# Patient Record
Sex: Female | Born: 1970 | Race: Black or African American | Hispanic: No | Marital: Single | Smoking: Former smoker
Health system: Southern US, Community
[De-identification: ages and names within clinical notes are randomized; demographics above are authoritative.]

## PROBLEM LIST (undated history)

## (undated) DIAGNOSIS — M1990 Unspecified osteoarthritis, unspecified site: Secondary | ICD-10-CM

## (undated) DIAGNOSIS — R7303 Prediabetes: Secondary | ICD-10-CM

## (undated) DIAGNOSIS — F329 Major depressive disorder, single episode, unspecified: Secondary | ICD-10-CM

## (undated) DIAGNOSIS — I10 Essential (primary) hypertension: Secondary | ICD-10-CM

## (undated) DIAGNOSIS — R0602 Shortness of breath: Secondary | ICD-10-CM

## (undated) DIAGNOSIS — R200 Anesthesia of skin: Secondary | ICD-10-CM

## (undated) DIAGNOSIS — M549 Dorsalgia, unspecified: Secondary | ICD-10-CM

## (undated) DIAGNOSIS — E559 Vitamin D deficiency, unspecified: Secondary | ICD-10-CM

## (undated) DIAGNOSIS — M2550 Pain in unspecified joint: Secondary | ICD-10-CM

## (undated) DIAGNOSIS — R600 Localized edema: Secondary | ICD-10-CM

## (undated) DIAGNOSIS — R079 Chest pain, unspecified: Secondary | ICD-10-CM

## (undated) DIAGNOSIS — F32A Depression, unspecified: Secondary | ICD-10-CM

## (undated) HISTORY — DX: Anesthesia of skin: R20.0

## (undated) HISTORY — PX: COLONOSCOPY: SHX174

## (undated) HISTORY — PX: TUBAL LIGATION: SHX77

## (undated) HISTORY — DX: Localized edema: R60.0

## (undated) HISTORY — DX: Unspecified osteoarthritis, unspecified site: M19.90

## (undated) HISTORY — DX: Prediabetes: R73.03

## (undated) HISTORY — DX: Essential (primary) hypertension: I10

## (undated) HISTORY — PX: CHOLECYSTECTOMY: SHX55

## (undated) HISTORY — DX: Depression, unspecified: F32.A

## (undated) HISTORY — DX: Pain in unspecified joint: M25.50

## (undated) HISTORY — DX: Shortness of breath: R06.02

## (undated) HISTORY — DX: Chest pain, unspecified: R07.9

## (undated) HISTORY — DX: Vitamin D deficiency, unspecified: E55.9

## (undated) HISTORY — DX: Dorsalgia, unspecified: M54.9

---
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
## 2010-05-14 ENCOUNTER — Emergency Department (HOSPITAL_BASED_OUTPATIENT_CLINIC_OR_DEPARTMENT_OTHER)
Admission: EM | Admit: 2010-05-14 | Discharge: 2010-05-14 | Disposition: A | Discharge status: Home / Self Care | Payer: Self-pay | Attending: Emergency Medicine | Admitting: Emergency Medicine

## 2010-05-14 DIAGNOSIS — K029 Dental caries, unspecified: Secondary | ICD-10-CM | POA: Insufficient documentation

## 2010-05-14 DIAGNOSIS — I10 Essential (primary) hypertension: Secondary | ICD-10-CM | POA: Insufficient documentation

## 2010-05-14 DIAGNOSIS — K089 Disorder of teeth and supporting structures, unspecified: Secondary | ICD-10-CM | POA: Insufficient documentation

## 2010-05-14 DIAGNOSIS — E669 Obesity, unspecified: Secondary | ICD-10-CM | POA: Insufficient documentation

## 2010-10-16 ENCOUNTER — Emergency Department (HOSPITAL_BASED_OUTPATIENT_CLINIC_OR_DEPARTMENT_OTHER)
Admission: EM | Admit: 2010-10-16 | Discharge: 2010-10-16 | Disposition: A | Discharge status: Home / Self Care | Payer: Self-pay | Attending: Emergency Medicine | Admitting: Emergency Medicine

## 2010-10-16 ENCOUNTER — Encounter: Payer: Self-pay | Admitting: *Deleted

## 2010-10-16 DIAGNOSIS — M545 Low back pain, unspecified: Secondary | ICD-10-CM | POA: Insufficient documentation

## 2010-10-16 DIAGNOSIS — S39012A Strain of muscle, fascia and tendon of lower back, initial encounter: Secondary | ICD-10-CM

## 2010-10-16 DIAGNOSIS — M25559 Pain in unspecified hip: Secondary | ICD-10-CM | POA: Insufficient documentation

## 2010-10-16 DIAGNOSIS — R209 Unspecified disturbances of skin sensation: Secondary | ICD-10-CM | POA: Insufficient documentation

## 2010-10-16 LAB — PREGNANCY, URINE: Preg Test, Ur: NEGATIVE

## 2010-10-16 MED ORDER — HYDROCODONE-ACETAMINOPHEN 5-325 MG PO TABS: 2.0000 | ORAL_TABLET | ORAL | Status: AC | PRN

## 2010-10-16 MED ORDER — IBUPROFEN 600 MG PO TABS: 600.0000 mg | ORAL_TABLET | Freq: Four times a day (QID) | ORAL | Status: AC | PRN

## 2010-10-16 MED ORDER — IBUPROFEN 800 MG PO TABS
800.0000 mg | ORAL_TABLET | Freq: Once | ORAL | Status: AC
  Administered 2010-10-16: 800 mg via ORAL
  Filled 2010-10-16: qty 1

## 2010-10-16 MED ORDER — DIAZEPAM 5 MG/ML IJ SOLN
2.5000 mg | Freq: Once | INTRAMUSCULAR | Status: AC
  Administered 2010-10-16: 2.5 mg via INTRAMUSCULAR
  Filled 2010-10-16: qty 2

## 2010-10-16 MED ORDER — CYCLOBENZAPRINE HCL 10 MG PO TABS: 10.0000 mg | ORAL_TABLET | Freq: Two times a day (BID) | ORAL | Status: AC | PRN

## 2010-10-16 MED ORDER — MORPHINE SULFATE 4 MG/ML IJ SOLN
4.0000 mg | Freq: Once | INTRAMUSCULAR | Status: AC
  Administered 2010-10-16: 4 mg via INTRAMUSCULAR
  Filled 2010-10-16: qty 1

## 2010-10-16 NOTE — ED Notes (Signed)
Lower back pain. No known inj.

## 2010-10-16 NOTE — ED Provider Notes (Signed)
History    Scribed for Forbes Cellar, MD, the patient was seen in room MH02/MH02. This chart was scribed by Clarita Crane. This patient's care was started at 3:03PM.  CSN: [id number] Arrival date & time: 10/16/2010  2:28 PM  Chief Complaint  Patient presents with  . Back Pain   The history is provided by the patient. No language interpreter was used.  Patient is a 40 year old female c/o constant lower back pain radiating to bilateral hips described as an aching and burning onset 3 days ago and persistent since. States back pain also radiates to her bilateral thighs but only with standing.  Denies incontinence, urinary retention,abdominal pain, nausea, vomiting, chest pain, chills. States pain is aggravated with walking and mildly relieved with sitting in a stiff chair. Notes pain was not relieved with use of Oxycodone and Tylenol. Denies h/o similar back pain previously. Patient reports her occupation requires significant prolonged standing which she believes contributes to her lower back pain. Patient additionally states she will have intermittent episodes of numbness to her bilateral lower extremities which began prior to the onset of her back pain. Denies h/o chronic back pain, HIV, drug abuse, etoh use, cancer. Reports surgical h/o tubal ligation and cholecystectomy. Patient is a current smoker.  denies incontinence/retention. Pain worse with movement  PAST MEDICAL HISTORY:  History reviewed. No pertinent past medical history.  PAST SURGICAL HISTORY:  Past Surgical History  Procedure Date  . Tubal ligation   . Cholecystectomy     MEDICATIONS:  Previous Medications   No medications on file     ALLERGIES:  Allergies as of 10/16/2010  . (No Known Allergies)     FAMILY HISTORY:  No family history on file.   SOCIAL HISTORY: History   Social History  . Marital Status: Single    Spouse Name: N/A    Number of Children: N/A  . Years of Education: N/A   Social History Main  Topics  . Smoking status: Current Everyday Smoker -- 0.5 packs/day  . Smokeless tobacco: None  . Alcohol Use: No  . Drug Use: No  . Sexually Active:    Other Topics Concern  . None   Social History Narrative  . None      OB History    Grav Para Term Preterm Abortions TAB SAB Ect Mult Living                  Review of Systems  10 Systems reviewed and are negative for acute change except as noted in the HPI.   Physical Exam  BP 162/76  Pulse 93  Temp(Src) 98.5 F (36.9 C) (Oral)  Resp 20  SpO2 100%  Physical Exam  Nursing note and vitals reviewed. Constitutional: She is oriented to person, place, and time. She appears well-developed and well-nourished.  HENT:  Head: Normocephalic and atraumatic.  Mouth/Throat: Oropharynx is clear and moist.  Eyes: Conjunctivae are normal. Pupils are equal, round, and reactive to light.  Neck: Neck supple.  Cardiovascular: Normal rate and regular rhythm.  Exam reveals no gallop and no friction rub.   No murmur heard. Pulmonary/Chest: Effort normal and breath sounds normal. She has no wheezes. She has no rales.  Abdominal: Soft. She exhibits no distension. There is no tenderness. There is no rebound and no guarding.  Musculoskeletal:       Diffuse midline and paraspinal lumbar tenderness to palpation. Ambulatory with a cautious gait. Positive left-sided straight leg raise. Negative right sided straight leg  raise.  Lymphadenopathy:    She has no cervical adenopathy.  Neurological: She is alert and oriented to person, place, and time. No sensory deficit.       5/5 strength of bilateral lower extremities.   Skin: Skin is warm and dry.       No saddle anesthesia  Psychiatric: She has a normal mood and affect. Her behavior is normal.   ED Course  Procedures  OTHER DATA REVIEWED: Nursing notes, vital signs, and past medical records reviewed.    DIAGNOSTIC STUDIES:    LABS / RADIOLOGY: Results for orders placed during the  hospital encounter of 10/16/10  PREGNANCY, URINE      Component Value Range   Preg Test, Ur NEGATIVE       PROCEDURES:  ED COURSE / COORDINATION OF CARE: 3:11PM- Patient explained plan of treatment and consents to treatment plan at this time. Patient advised of smoking cessation as well.  3:45PM- Feels better after morphine/valium. Continues to deny new numbness/tingling/weakness. Comfortable with plan for pain control and home  MDM: Suspect msk back pain. Possible Lt sciatica component as well. Do not suspect cauda equina or infectious. Feeling better now. Will discharge with ibuprofen/norco/flexeril. Needs pmd f/u    PLAN: Discharge The patient is to return the emergency department if there is any worsening of symptoms. I have reviewed the discharge instructions with the patient/family   CONDITION ON DISCHARGE: Stable   MEDICATIONS GIVEN IN THE E.D.  Medications  diazepam (VALIUM) injection 2.5 mg (2.5 mg Intramuscular Given 10/16/10 1515)  morphine injection 4 mg (4 mg Intramuscular Given 10/16/10 1515)  ibuprofen (ADVIL,MOTRIN) tablet 800 mg (800 mg Oral Given 10/16/10 1515)     I personally performed the services described in this documentation, which was scribed in my presence. The recorded information has been reviewed and considered. Forbes Cellar, MD    Forbes Cellar, MD 10/16/10 [phone]

## 2014-08-01 ENCOUNTER — Emergency Department (HOSPITAL_BASED_OUTPATIENT_CLINIC_OR_DEPARTMENT_OTHER): Payer: Self-pay

## 2014-08-01 ENCOUNTER — Encounter (HOSPITAL_BASED_OUTPATIENT_CLINIC_OR_DEPARTMENT_OTHER): Payer: Self-pay | Admitting: *Deleted

## 2014-08-01 ENCOUNTER — Emergency Department (HOSPITAL_BASED_OUTPATIENT_CLINIC_OR_DEPARTMENT_OTHER)
Admission: EM | Admit: 2014-08-01 | Discharge: 2014-08-01 | Disposition: A | Discharge status: Home / Self Care | Payer: Self-pay | Attending: Emergency Medicine | Admitting: Emergency Medicine

## 2014-08-01 DIAGNOSIS — Y9389 Activity, other specified: Secondary | ICD-10-CM | POA: Insufficient documentation

## 2014-08-01 DIAGNOSIS — Y9289 Other specified places as the place of occurrence of the external cause: Secondary | ICD-10-CM | POA: Insufficient documentation

## 2014-08-01 DIAGNOSIS — Z8739 Personal history of other diseases of the musculoskeletal system and connective tissue: Secondary | ICD-10-CM | POA: Insufficient documentation

## 2014-08-01 DIAGNOSIS — Z720 Tobacco use: Secondary | ICD-10-CM | POA: Insufficient documentation

## 2014-08-01 DIAGNOSIS — S82831A Other fracture of upper and lower end of right fibula, initial encounter for closed fracture: Secondary | ICD-10-CM

## 2014-08-01 DIAGNOSIS — Y998 Other external cause status: Secondary | ICD-10-CM | POA: Insufficient documentation

## 2014-08-01 DIAGNOSIS — S63502A Unspecified sprain of left wrist, initial encounter: Secondary | ICD-10-CM

## 2014-08-01 DIAGNOSIS — X58XXXA Exposure to other specified factors, initial encounter: Secondary | ICD-10-CM | POA: Insufficient documentation

## 2014-08-01 HISTORY — DX: Unspecified osteoarthritis, unspecified site: M19.90

## 2014-08-01 IMAGING — DX DG ANKLE COMPLETE 3+V*R*
3 series · 3 of 3 positions shown · non-contrast
Comparison: None.

CLINICAL DATA: Fell roller-skating at [DATE] today now with right
lateral ankle pain and swelling. Initial encounter.

EXAM:
RIGHT ANKLE - COMPLETE 3+ VIEW

[ankle ap]
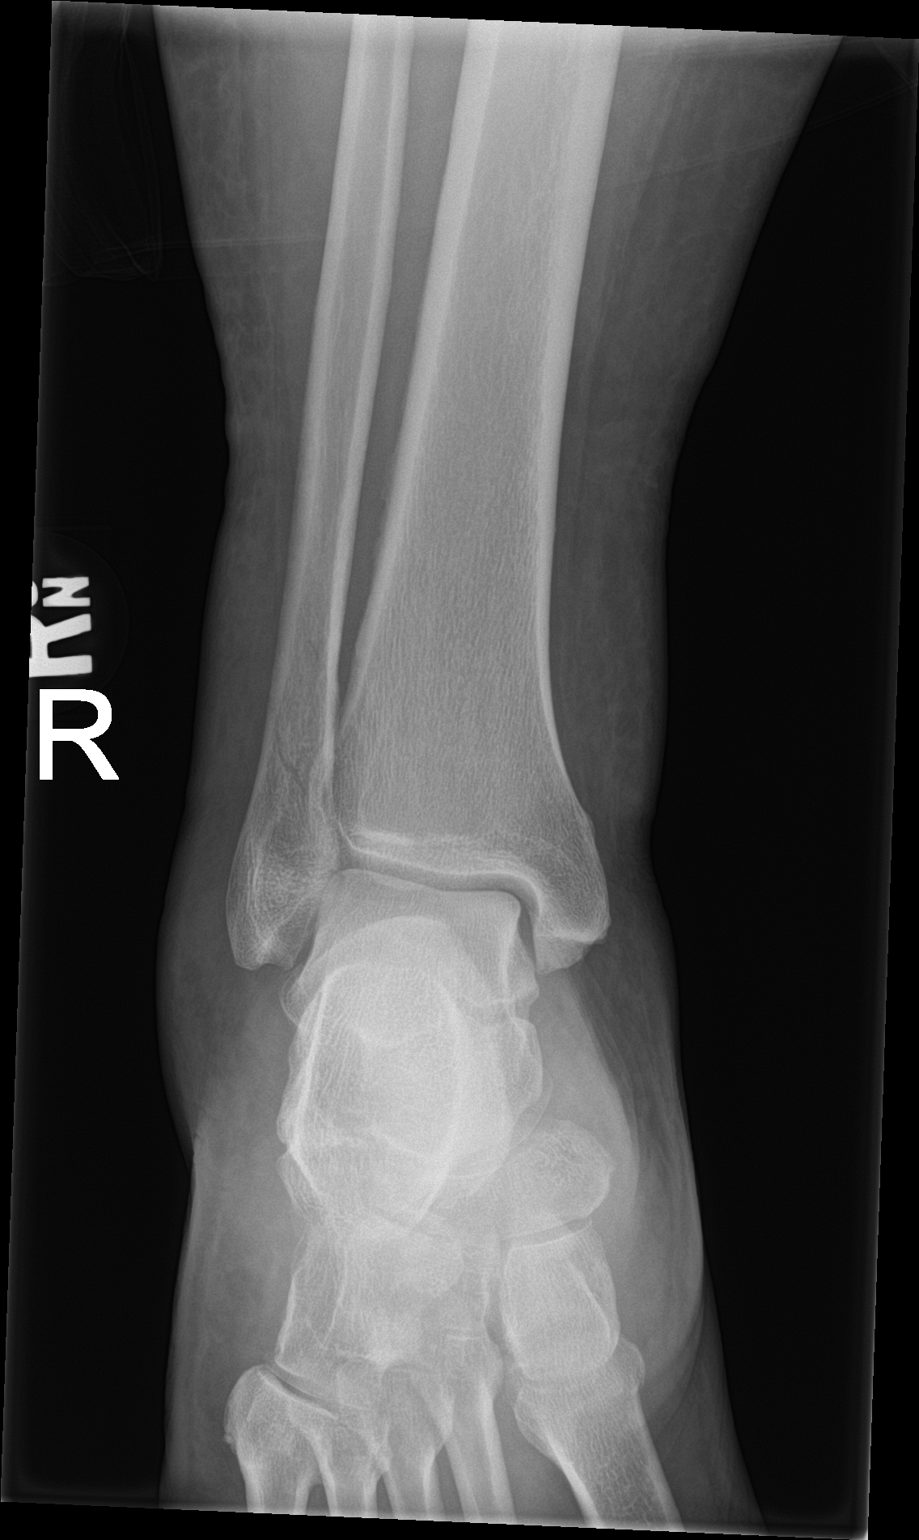

[ankle obl]
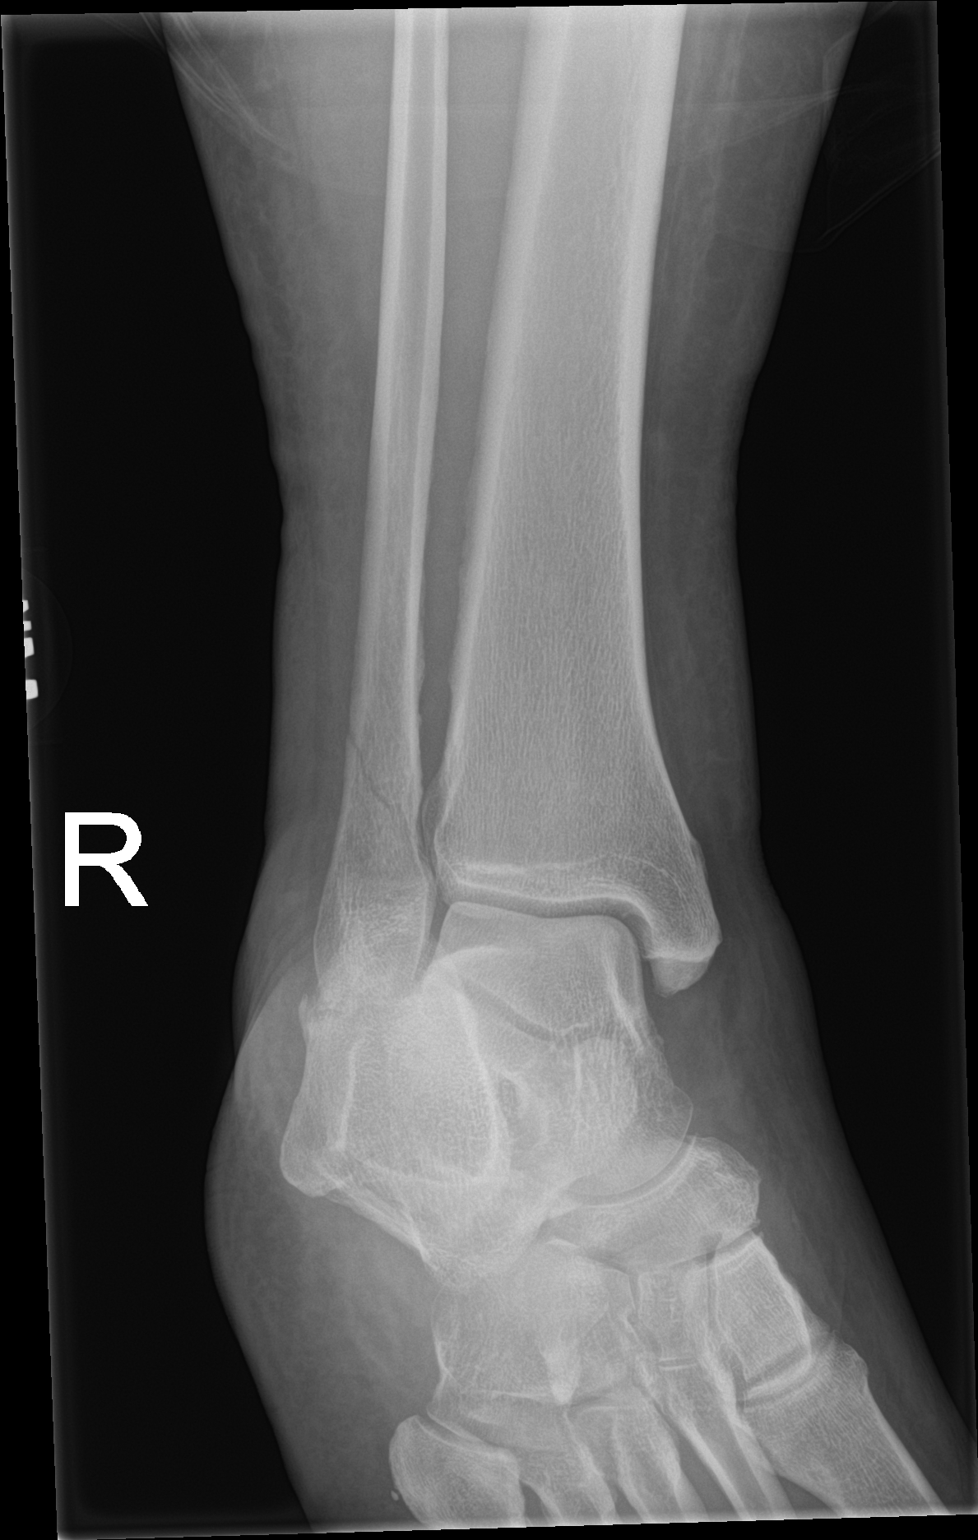

[ankle lat]
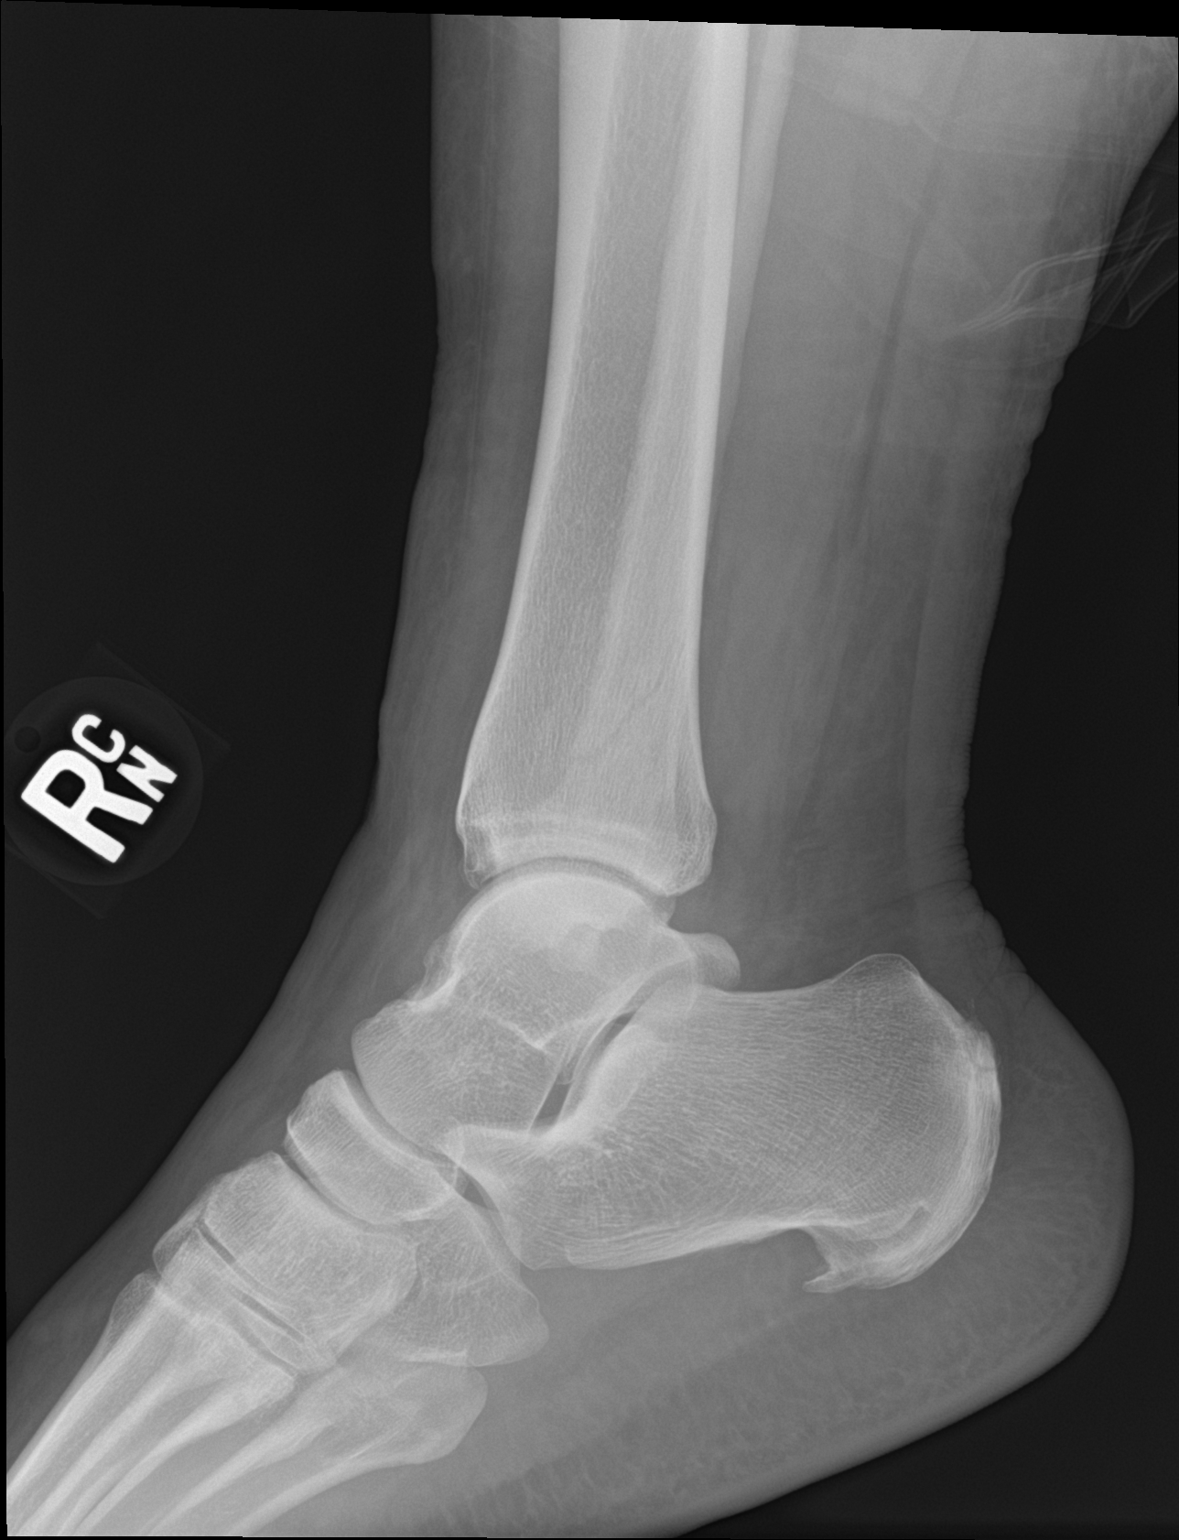

[3 of 3 positions shown; findings below may reference images not displayed]

FINDINGS: There is a nondisplaced obliquely oriented fracture involving the
distal fibular metaphysis with extension to the distal tib-fib
joint. Expected adjacent soft tissue swelling joint spaces are
preserved. Ankle mortise is preserved. No ankle joint effusion.
Moderate-sized plantar calcaneal spur. Minimal enthesopathic change
of the Achilles tendon insertion site. A tiny ossicles noted
adjacent to the base of the fifth metatarsal. No additional
fractures identified.
IMPRESSION: Acute obliquely oriented nondisplaced fracture involving the distal
tibial metaphysis with extension to the distal tib-fib joint but
without disruption of the ankle mortise.

## 2014-08-01 IMAGING — DX DG WRIST COMPLETE 3+V*L*
4 series · 4 of 4 positions shown · non-contrast
Comparison: None.

CLINICAL DATA: Left wrist pain after an accident at the roller
rink.

EXAM:
LEFT WRIST - COMPLETE 3+ VIEW

[wrist pa]
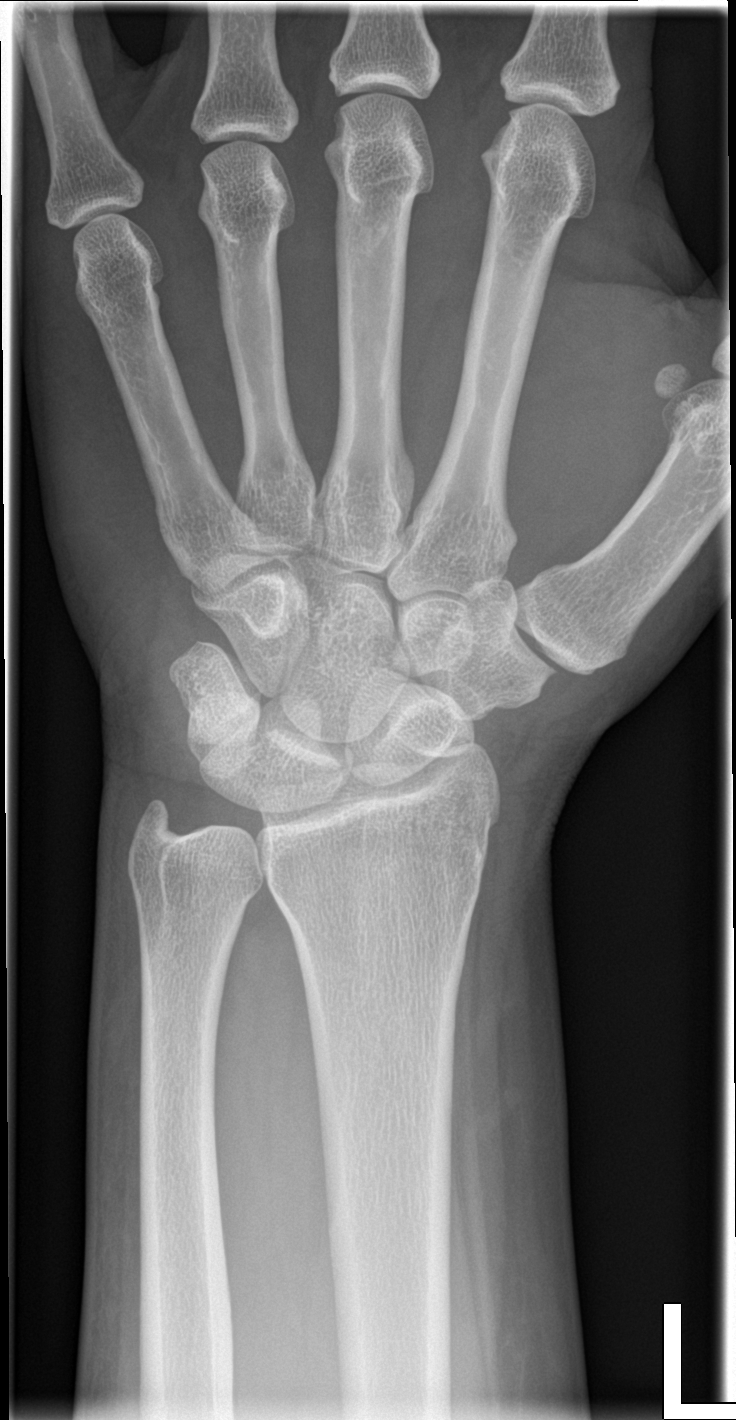

[wrist obl]
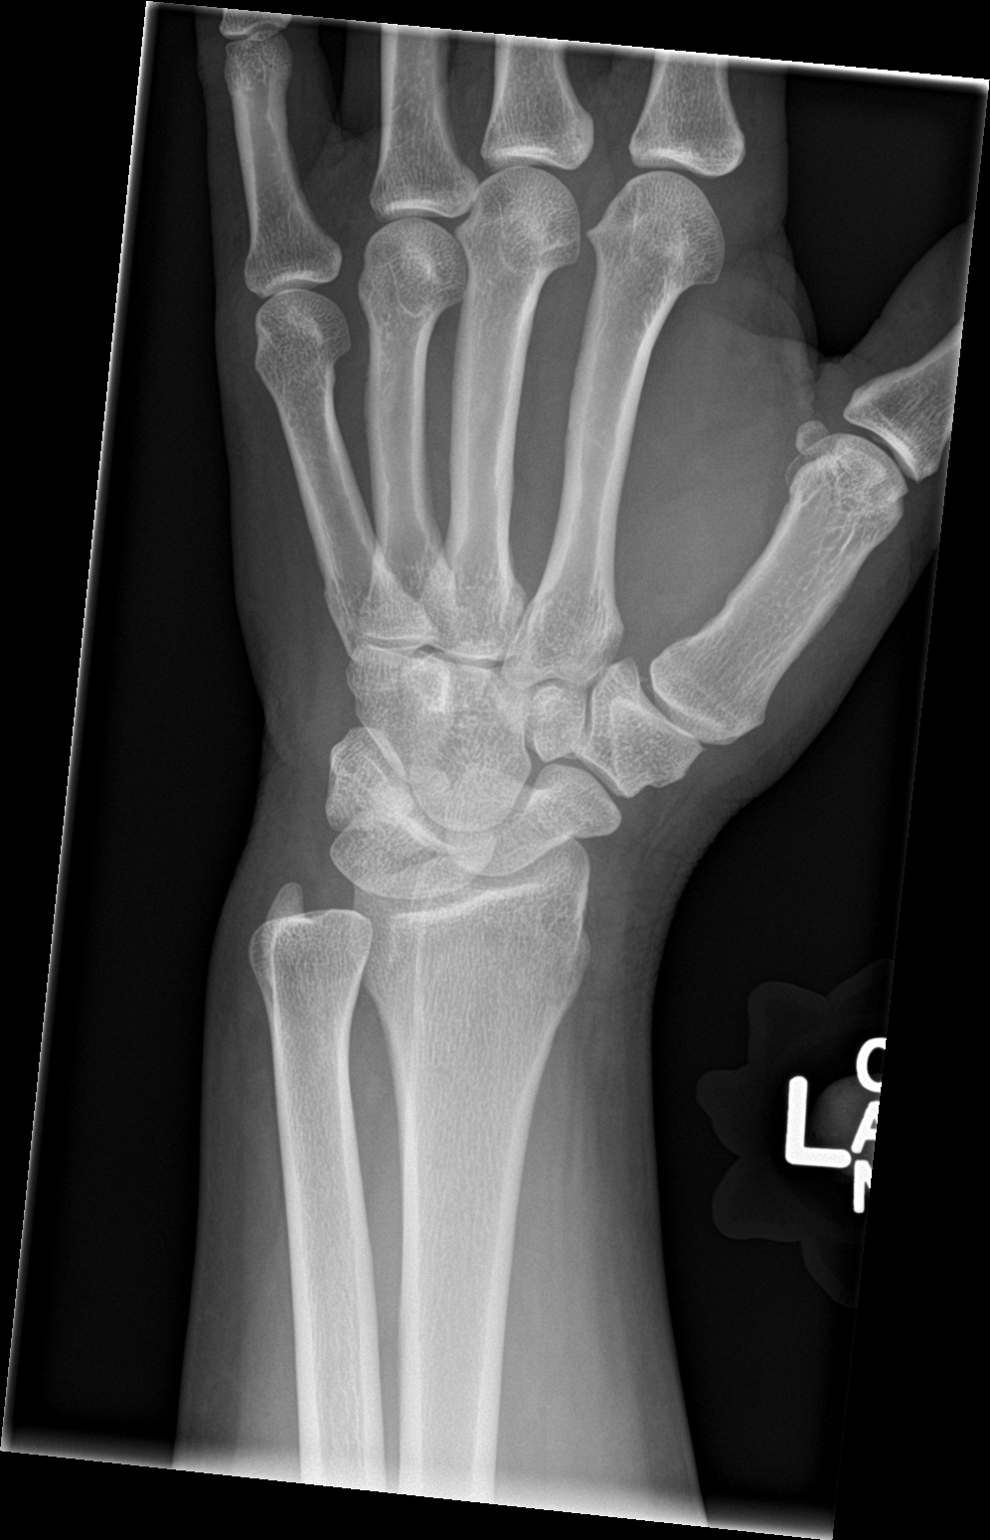

[wrist lat]
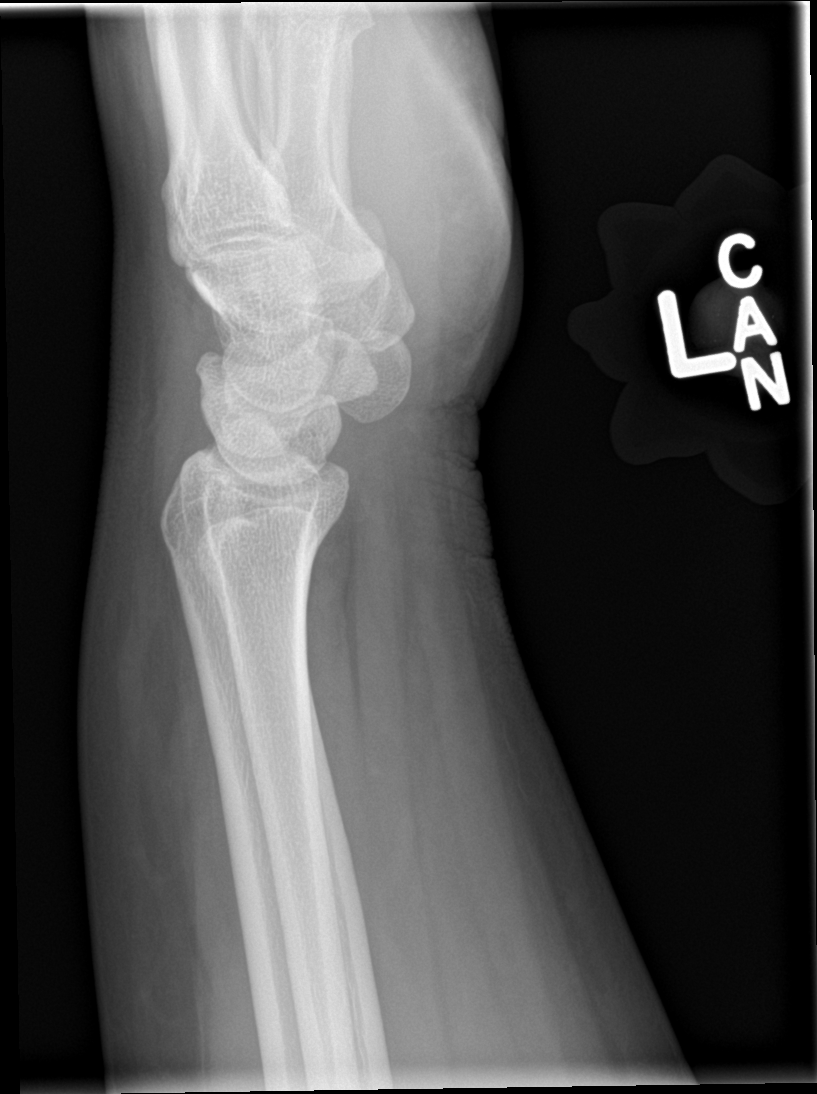

[wrist navicular]
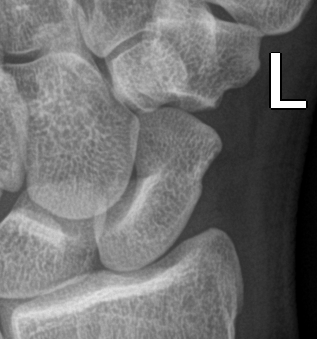

[4 of 4 positions shown; findings below may reference images not displayed]

FINDINGS: Scapholunate angle is 72 degrees on the lateral projection,
borderline increased, but the capitolunate angle is normal.

No fracture is identified.  No acute bony findings.
IMPRESSION: 1. No significant abnormality identified. If the patient's
tenderness is in the vicinity of the scaphoid/anatomic snuffbox,
then cross-sectional imaging or presumptive treatment for occult
scaphoid fracture might be considered.

## 2014-08-01 MED ORDER — IBUPROFEN 600 MG PO TABS: 600.0000 mg | ORAL_TABLET | Freq: Four times a day (QID) | ORAL | Status: DC | PRN

## 2014-08-01 MED ORDER — IBUPROFEN 800 MG PO TABS
800.0000 mg | ORAL_TABLET | Freq: Once | ORAL | Status: AC
  Administered 2014-08-01: 800 mg via ORAL
  Filled 2014-08-01: qty 1

## 2014-08-01 NOTE — ED Notes (Addendum)
Dr. Bebe ShaggyWickline into room. Pt seen by EDP prior to RN assessment, see MD notes, pending orders. Pending xray results.

## 2014-08-01 NOTE — ED Notes (Signed)
Splint in place, crutches in hand, "ready to go, feel better", rates pain decreased at 5/10, denies sx other than pain. Denies needs or questions. R foot toes warm, denies numbness or tingling.

## 2014-08-01 NOTE — ED Notes (Signed)
Pt here for pain in right ankle after accident at the roller rink, pt thinks she struck her ankle against the wall.  Pt ambulatory with great pain after injury

## 2014-08-01 NOTE — Discharge Instructions (Signed)
Ankle Fracture °A fracture is a break in a bone. The ankle joint is made up of three bones. These include the lower (distal) sections of your lower leg bones, called the tibia and fibula, along with a bone in your foot, called the talus. Depending on how bad the break is and if more than one ankle joint bone is broken, a cast or splint is used to protect and keep your injured bone from moving while it heals. Sometimes, surgery is required to help the fracture heal properly.  °There are two general types of fractures: °· Stable fracture. This includes a single fracture line through one bone, with no injury to ankle ligaments. A fracture of the talus that does not have any displacement (movement of the bone on either side of the fracture line) is also stable. °· Unstable fracture. This includes more than one fracture line through one or more bones in the ankle joint. It also includes fractures that have displacement of the bone on either side of the fracture line. °CAUSES °· A direct blow to the ankle.   °· Quickly and severely twisting your ankle. °· Trauma, such as a car accident or falling from a significant height. °RISK FACTORS °You may be at a higher risk of ankle fracture if: °· You have certain medical conditions. °· You are involved in high-impact sports. °· You are involved in a high-impact car accident. °SIGNS AND SYMPTOMS  °· Tender and swollen ankle. °· Bruising around the injured ankle. °· Pain on movement of the ankle. °· Difficulty walking or putting weight on the ankle. °· A cold foot below the site of the ankle injury. This can occur if the blood vessels passing through your injured ankle were also damaged. °· Numbness in the foot below the site of the ankle injury. °DIAGNOSIS  °An ankle fracture is usually diagnosed with a physical exam and X-rays. A CT scan may also be required for complex fractures. °TREATMENT  °Stable fractures are treated with a cast or splint and using crutches to avoid putting  weight on your injured ankle. This is followed by an ankle strengthening program. Some patients require a special type of cast, depending on other medical problems they may have. Unstable fractures require surgery to ensure the bones heal properly. Your health care provider will tell you what type of fracture you have and the best treatment for your condition. °HOME CARE INSTRUCTIONS  °· Review correct crutch use with your health care provider and use your crutches as directed. Safe use of crutches is extremely important. Misuse of crutches can cause you to fall or cause injury to nerves in your hands or armpits. °· Do not put weight or pressure on the injured ankle until directed by your health care provider. °· To lessen the swelling, keep the injured leg elevated while sitting or lying down. °· Apply ice to the injured area: °¨ Put ice in a plastic bag. °¨ Place a towel between your cast and the bag. °¨ Leave the ice on for 20 minutes, 2-3 times a day. °· If you have a plaster or fiberglass cast: °¨ Do not try to scratch the skin under the cast with any objects. This can increase your risk of skin infection. °¨ Check the skin around the cast every day. You may put lotion on any red or sore areas. °¨ Keep your cast dry and clean. °· If you have a plaster splint: °¨ Wear the splint as directed. °¨ You may loosen the elastic   around the splint if your toes become numb, tingle, or turn cold or blue. °· Do not put pressure on any part of your cast or splint; it may break. Rest your cast only on a pillow the first 24 hours until it is fully hardened. °· Your cast or splint can be protected during bathing with a plastic bag sealed to your skin with medical tape. Do not lower the cast or splint into water. °· Take medicines as directed by your health care provider. Only take over-the-counter or prescription medicines for pain, discomfort, or fever as directed by your health care provider. °· Do not drive a vehicle until  your health care provider specifically tells you it is safe to do so. °· If your health care provider has given you a follow-up appointment, it is very important to keep that appointment. Not keeping the appointment could result in a chronic or permanent injury, pain, and disability. If you have any problem keeping the appointment, call the facility for assistance. °SEEK MEDICAL CARE IF: °You develop increased swelling or discomfort. °SEEK IMMEDIATE MEDICAL CARE IF:  °· Your cast gets damaged or breaks. °· You have continued severe pain. °· You develop new pain or swelling after the cast was put on. °· Your skin or toenails below the injury turn blue or gray. °· Your skin or toenails below the injury feel cold, numb, or have loss of sensitivity to touch. °· There is a bad smell or pus draining from under the cast. °MAKE SURE YOU:  °· Understand these instructions. °· Will watch your condition. °· Will get help right away if you are not doing well or get worse. °Document Released: 02/24/2000 Document Revised: 03/03/2013 Document Reviewed: 09/25/2012 °[employer]® Patient Information ©2015 [employer]. This information is not intended to replace advice given to you by your health care provider. Make sure you discuss any questions you have with your health care provider. °Cast or Splint Care °Casts and splints support injured limbs and keep bones from moving while they heal. It is important to care for your cast or splint at home.   °HOME CARE INSTRUCTIONS °· Keep the cast or splint uncovered during the drying period. It can take 24 to 48 hours to dry if it is made of plaster. A fiberglass cast will dry in less than 1 hour. °· Do not rest the cast on anything harder than a pillow for the first 24 hours. °· Do not put weight on your injured limb or apply pressure to the cast until your health care provider gives you permission. °· Keep the cast or splint dry. Wet casts or splints can lose their shape and may not support  the limb as well. A wet cast that has lost its shape can also create harmful pressure on your skin when it dries. Also, wet skin can become infected. °¨ Cover the cast or splint with a plastic bag when bathing or when out in the rain or snow. If the cast is on the trunk of the body, take sponge baths until the cast is removed. °¨ If your cast does become wet, dry it with a towel or a blow dryer on the cool setting only. °· Keep your cast or splint clean. Soiled casts may be wiped with a moistened cloth. °· Do not place any hard or soft foreign objects under your cast or splint, such as cotton, toilet paper, lotion, or powder. °· Do not try to scratch the skin under the cast with any   object. The object could get stuck inside the cast. Also, scratching could lead to an infection. If itching is a problem, use a blow dryer on a cool setting to relieve discomfort. °· Do not trim or cut your cast or remove padding from inside of it. °· Exercise all joints next to the injury that are not immobilized by the cast or splint. For example, if you have a long leg cast, exercise the hip joint and toes. If you have an arm cast or splint, exercise the shoulder, elbow, thumb, and fingers. °· Elevate your injured arm or leg on 1 or 2 pillows for the first 1 to 3 days to decrease swelling and pain. It is best if you can comfortably elevate your cast so it is higher than your heart. °SEEK MEDICAL CARE IF:  °· Your cast or splint cracks. °· Your cast or splint is too tight or too loose. °· You have unbearable itching inside the cast. °· Your cast becomes wet or develops a soft spot or area. °· You have a bad smell coming from inside your cast. °· You get an object stuck under your cast. °· Your skin around the cast becomes red or raw. °· You have new pain or worsening pain after the cast has been applied. °SEEK IMMEDIATE MEDICAL CARE IF:  °· You have fluid leaking through the cast. °· You are unable to move your fingers or toes. °· You  have discolored (blue or white), cool, painful, or very swollen fingers or toes beyond the cast. °· You have tingling or numbness around the injured area. °· You have severe pain or pressure under the cast. °· You have any difficulty with your breathing or have shortness of breath. °· You have chest pain. °Document Released: 02/24/2000 Document Revised: 12/17/2012 Document Reviewed: 09/04/2012 °[employer]® Patient Information ©2015 [employer]. This information is not intended to replace advice given to you by your health care provider. Make sure you discuss any questions you have with your health care provider. ° °

## 2014-08-01 NOTE — ED Provider Notes (Signed)
CSN: [id number]     Arrival date & time 08/01/14  2058 History  This chart was scribed for Jenna Rhine, MD by Jenna [name], ED Scribe. This patient was seen in room MH10/MH10 and the patient's care was started 9:23 PM.   Chief Complaint  Patient presents with  . Ankle Pain   Patient is a 44 y.o. female presenting with ankle pain. The history is provided by the patient. No language interpreter was used.  Ankle Pain Location:  Ankle Time since incident:  6 hours Injury: yes   Mechanism of injury: fall   Fall:    Fall occurred:  Recreating/playing   Impact surface:  Hard floor   Entrapped after fall: no   Ankle location:  R ankle Pain details:    Quality:  Aching   Radiates to:  Does not radiate   Severity:  Moderate   Onset quality:  Sudden   Duration:  6 hours   Timing:  Constant   Progression:  Unchanged Chronicity:  New Dislocation: no   Foreign body present:  No foreign bodies Prior injury to area:  No Relieved by:  None tried Worsened by:  Nothing tried Ineffective treatments:  None tried Associated symptoms: swelling   Associated symptoms: no back pain and no neck pain    HPI Comments: Jenna [name] is a 44 y.o. female with a PMHx of arthritis, who presents to the Emergency Department complaining of constant, moderate right ankle pain, right ankle swelling, and left wrist pain after a fall at the roller rink that occurred at 3:43pm today (6 hours ago). Pt reports she heard a pop' to ankle during fall. PT denies LOC, head impact, neck pain, or back pain. Pt denies no prior history of HTN or DM. Pt reports she has not taken any medication PTA for pain. She states she did take two Aleve this morning, which she does everyday for arthritis in her knees.   Past Medical History  Diagnosis Date  . Arthritis    Past Surgical History  Procedure Laterality Date  . Tubal ligation    . Cholecystectomy     No family history on file. History  Substance Use Topics  .  Smoking status: Current Every Day Smoker -- 0.00 packs/day    Types: Cigarettes  . Smokeless tobacco: Not on file  . Alcohol Use: No   OB History    No data available     Review of Systems  Musculoskeletal: Positive for joint swelling ( right ankle swelling ) and arthralgias. Negative for back pain and neck pain.       Right ankle pain  Neurological: Negative for syncope.  All other systems reviewed and are negative.  Allergies  Review of patient's allergies indicates no known allergies.  Home Medications   Prior to Admission medications   Not on File   Triage Vitals: BP 147/71 mmHg  Pulse 97  Temp(Src) 97.7 F (36.5 C) (Oral)  Resp 20  Ht  (1.6 m)  Wt 262 lb (118.842 kg)  BMI 46.42 kg/m2  SpO2 100%  LMP 07/26/2014  Physical Exam  CONSTITUTIONAL: Well developed/well nourished HEAD: Normocephalic/atraumatic EYES: EOMI/PERRL ENMT: Mucous membranes moist NECK: supple no meningeal signs SPINE/BACK:entire spine nontender CV: S1/S2 noted, no murmurs/rubs/gallops noted LUNGS: Lungs are clear to auscultation bilaterally, no apparent distress ABDOMEN: soft, nontender, no rebound or guarding, bowel sounds noted throughout abdomen GU:no cva tenderness NEURO: Pt is awake/alert/appropriate, moves all extremitiesx4.  No facial droop.   EXTREMITIES: pulses  normal/equal, full ROM, mild tenderness to thenar of left hand, full ROM of left wrist, no left snuffbox tenderness, tenderness to right lateral malleolus, no foot tenderness noted  No new injury/tenderness to right knee/prox fib SKIN: warm, color normal PSYCH: no abnormalities of mood noted, alert and oriented to situation  ED Course  Procedures  DIAGNOSTIC STUDIES: Oxygen Saturation is 100% on RA, normal by my interpretation.    COORDINATION OF CARE: 9:30 PM Discussed treatment plan with pt at bedside to put ankle in a splint, give pt crutches and administer ibuprofen for pain management; pt agreed to plan.    Imaging Review Dg Wrist Complete Left  08/01/2014   CLINICAL DATA:  Left wrist pain after an accident at the roller rink.  EXAM: LEFT WRIST - COMPLETE 3+ VIEW  COMPARISON:  None.  FINDINGS: Scapholunate angle is 72 degrees on the lateral projection, borderline increased, but the capitolunate angle is normal.  No fracture is identified.  No acute bony findings.  IMPRESSION: 1. No significant abnormality identified. If the patient's tenderness is in the vicinity of the scaphoid/anatomic snuffbox, then cross-sectional imaging or presumptive treatment for occult scaphoid fracture might be considered.   Electronically Signed   By: Gaylyn RongWalter  Liebkemann M.D.   On: 08/01/2014 21:37   Dg Ankle Complete Right  08/01/2014   CLINICAL DATA:  Larey SeatFell roller-skating at 15:00 today now with right lateral ankle pain and swelling. Initial encounter.  EXAM: RIGHT ANKLE - COMPLETE 3+ VIEW  COMPARISON:  None.  FINDINGS: There is a nondisplaced obliquely oriented fracture involving the distal fibular metaphysis with extension to the distal tib-fib joint. Expected adjacent soft tissue swelling joint spaces are preserved. Ankle mortise is preserved. No ankle joint effusion. Moderate-sized plantar calcaneal spur. Minimal enthesopathic change of the Achilles tendon insertion site. A tiny ossicles noted adjacent to the base of the fifth metatarsal. No additional fractures identified.  IMPRESSION: Acute obliquely oriented nondisplaced fracture involving the distal tibial metaphysis with extension to the distal tib-fib joint but without disruption of the ankle mortise.   Electronically Signed   By: Simonne ComeJohn  Watts M.D.   On: 08/01/2014 21:29  . SPLINT APPLICATION Date/Time: 08/01/14 Authorized by: Joya GaskinsWICKLINE,Dorethy Tomey W Consent: Verbal consent obtained. Risks and benefits: risks, benefits and alternatives were discussed Consent given by: patient Splint applied by: orthopedic technician Location details: right Lower extremity Splint type:  stirrup/posterior Supplies used: fiberglass Post-procedure: The splinted body part was neurovascularly unchanged following the procedure. Patient tolerance: Patient tolerated the procedure well with no immediate complications.   Pt splinted and given crutches and referral to sports medicine Pt reports left wrist is improved, doubt occult injury, doubt scaphoid injury  MDM   Final diagnoses:  Sprain of left wrist, initial encounter  Fracture of distal end of right fibula    Nursing notes including past medical history and social history reviewed and considered in documentation xrays/imaging reviewed by myself and considered during evaluation   I personally performed the services described in this documentation, which was scribed in my presence. The recorded information has been reviewed and is accurate.      Jenna Rhineonald Cherokee Clowers, MD 08/01/14 2312

## 2014-08-01 NOTE — ED Notes (Signed)
We applied a short leg posterior splint with a stirrup, using fiberglass material. Sock material, then cotton webril, then fiberglass with wrap of Kerlix, all secured with ace.

## 2014-08-01 NOTE — ED Notes (Signed)
Pt adds that she would like a left wrist x-ray as her wrist hurts too

## 2014-08-03 NOTE — ED Notes (Signed)
Patient called to state that she is having a burning pain in the back of her foot, and that the swelling went down and is now back.  Toes are pink and warm to touch.  Encouraged to elevate on two pillows with foot higher than the knee and the knee higher than the hip.  If no improvement in a few hours to return for a re-check.  Education provided that the swelling should peak today and go down if keep elevated as much as possible.

## 2014-08-06 ENCOUNTER — Encounter: Payer: Self-pay | Admitting: Family Medicine

## 2014-08-06 ENCOUNTER — Ambulatory Visit (INDEPENDENT_AMBULATORY_CARE_PROVIDER_SITE_OTHER): Payer: Self-pay | Admitting: Family Medicine

## 2014-08-06 VITALS — BP 123/81 | HR 92 | Ht 64.0 in | Wt 262.0 lb

## 2014-08-06 DIAGNOSIS — S82401A Unspecified fracture of shaft of right fibula, initial encounter for closed fracture: Secondary | ICD-10-CM

## 2014-08-06 MED ORDER — HYDROCODONE-ACETAMINOPHEN 5-325 MG PO TABS: 1.0000 | ORAL_TABLET | Freq: Four times a day (QID) | ORAL | Status: DC | PRN

## 2014-08-06 NOTE — Patient Instructions (Signed)
You have a distal fibula fracture. Wear cam walker every time you're up (ok to take off if sleeping, to wash area, when lying down). Ice 15 minutes at a time 3-4 times a day. Elevate above your heart when possible. Ibuprofen or aleve as needed for pain and inflammation. Norco as needed for severe pain. Follow up with me in about 2 weeks (a little less than this).

## 2014-08-10 DIAGNOSIS — S82401A Unspecified fracture of shaft of right fibula, initial encounter for closed fracture: Secondary | ICD-10-CM | POA: Insufficient documentation

## 2014-08-10 NOTE — Assessment & Plan Note (Signed)
Right distal fibula fracture - at and above level of ankle joint.  Discussed higher risk of nonunion with this type.  Cam walker with icing, elevation.  NSAIDs with norco as needed.  Advised starting with non weight bearing with crutches as well.  F/u in 2 weeks.

## 2014-08-10 NOTE — Progress Notes (Signed)
PCP: No primary care provider on file.  Subjective:   HPI: Patient is a 44 y.o. female here for right leg injury.  Patient reports she was roller skating on 5/22 when she hit the wall, injured right ankle. Felt a pop lateral right ankle. Unable to bear weight after this. + swelling. Feels a burning sensation as well. Radiographs showed distal fibula fracture with extension into the joint. Placed in a splint with crutches.  Past Medical History  Diagnosis Date  . Arthritis     Current Outpatient Prescriptions on File Prior to Visit  Medication Sig Dispense Refill  . ibuprofen (ADVIL,MOTRIN) 600 MG tablet Take 1 tablet (600 mg total) by mouth every 6 (six) hours as needed. 30 tablet 0   No current facility-administered medications on file prior to visit.    Past Surgical History  Procedure Laterality Date  . Tubal ligation    . Cholecystectomy      No Known Allergies  History   Social History  . Marital Status: Single    Spouse Name: N/A  . Number of Children: N/A  . Years of Education: N/A   Occupational History  . Not on file.   Social History Main Topics  . Smoking status: Current Every Day Smoker -- 0.00 packs/day    Types: Cigarettes  . Smokeless tobacco: Not on file  . Alcohol Use: No  . Drug Use: No  . Sexual Activity: Not on file   Other Topics Concern  . Not on file   Social History Narrative    No family history on file.  BP 123/81 mmHg  Pulse 92  Ht 5\' 4"  (1.626 m)  Wt 262 lb (118.842 kg)  BMI 44.95 kg/m2  LMP 07/26/2014  Review of Systems: See HPI above.    Objective:  Physical Exam:  Gen: NAD  Right ankle: Splint removed. Mod swelling, bruising laterally > medial ankle.  No other deformity. Did not test ROM with known fracture. TTP lateral malleolus.  No navicular, medial mallolar, base 5th, fibular head, other tenderness. Deferred ant drawer and talar tilt.   Thompsons test negative. NV intact distally.    Assessment &  Plan:  1. Right distal fibula fracture - at and above level of ankle joint.  Discussed higher risk of nonunion with this type.  Cam walker with icing, elevation.  NSAIDs with norco as needed.  Advised starting with non weight bearing with crutches as well.  F/u in 2 weeks.

## 2014-08-20 ENCOUNTER — Ambulatory Visit (HOSPITAL_BASED_OUTPATIENT_CLINIC_OR_DEPARTMENT_OTHER)
Admission: RE | Admit: 2014-08-20 | Discharge: 2014-08-20 | Disposition: A | Discharge status: Home / Self Care | Payer: Self-pay | Source: Ambulatory Visit | Attending: Family Medicine | Admitting: Family Medicine

## 2014-08-20 ENCOUNTER — Ambulatory Visit (INDEPENDENT_AMBULATORY_CARE_PROVIDER_SITE_OTHER): Payer: Self-pay | Admitting: Family Medicine

## 2014-08-20 ENCOUNTER — Encounter: Payer: Self-pay | Admitting: Family Medicine

## 2014-08-20 VITALS — BP 108/72 | HR 75 | Ht 64.0 in | Wt 260.0 lb

## 2014-08-20 DIAGNOSIS — S82401D Unspecified fracture of shaft of right fibula, subsequent encounter for closed fracture with routine healing: Secondary | ICD-10-CM

## 2014-08-20 DIAGNOSIS — X58XXXD Exposure to other specified factors, subsequent encounter: Secondary | ICD-10-CM | POA: Insufficient documentation

## 2014-08-20 DIAGNOSIS — S82831D Other fracture of upper and lower end of right fibula, subsequent encounter for closed fracture with routine healing: Secondary | ICD-10-CM | POA: Insufficient documentation

## 2014-08-20 IMAGING — CR DG ANKLE COMPLETE 3+V*R*
3 series · 3 of 3 positions shown · non-contrast
Comparison: [DATE]

CLINICAL DATA: Followup distal fibular fracture. Subsequent
encounter.

EXAM:
RIGHT ANKLE - COMPLETE 3+ VIEW

[t ankle joint ap right]
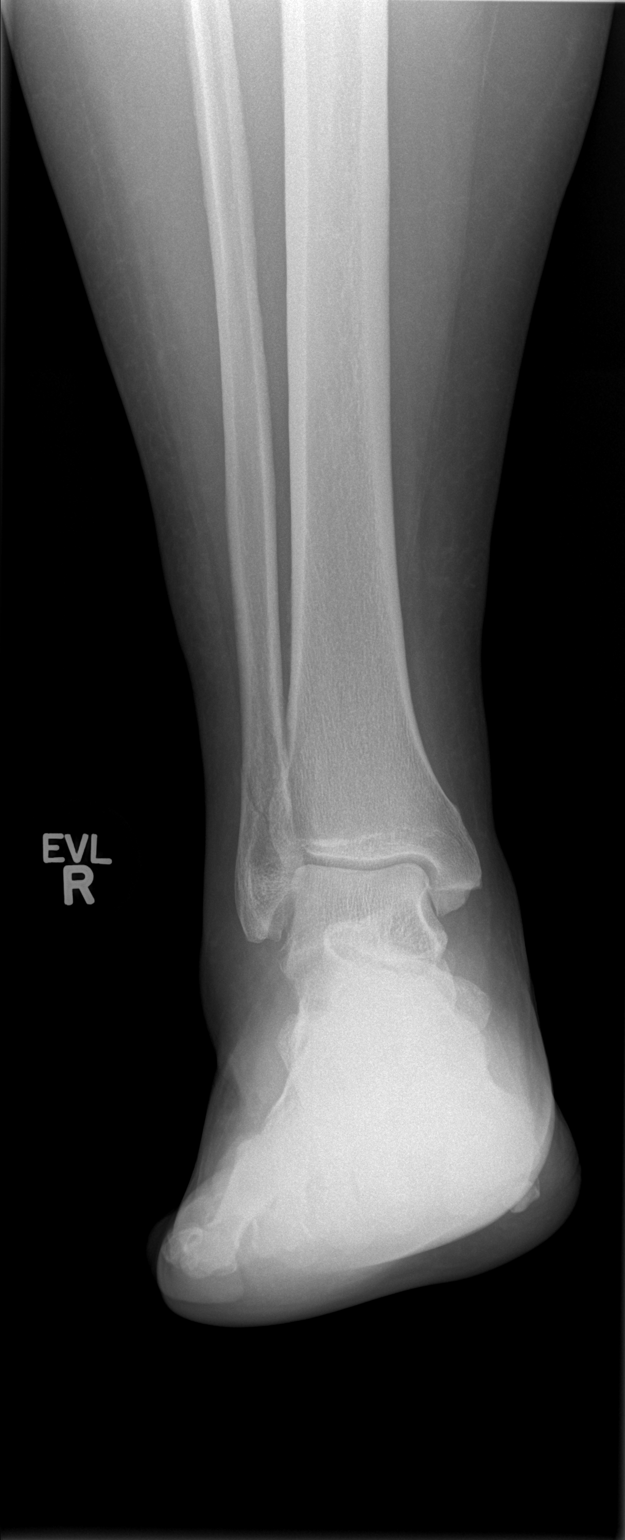

[t ankle joint oblique right]
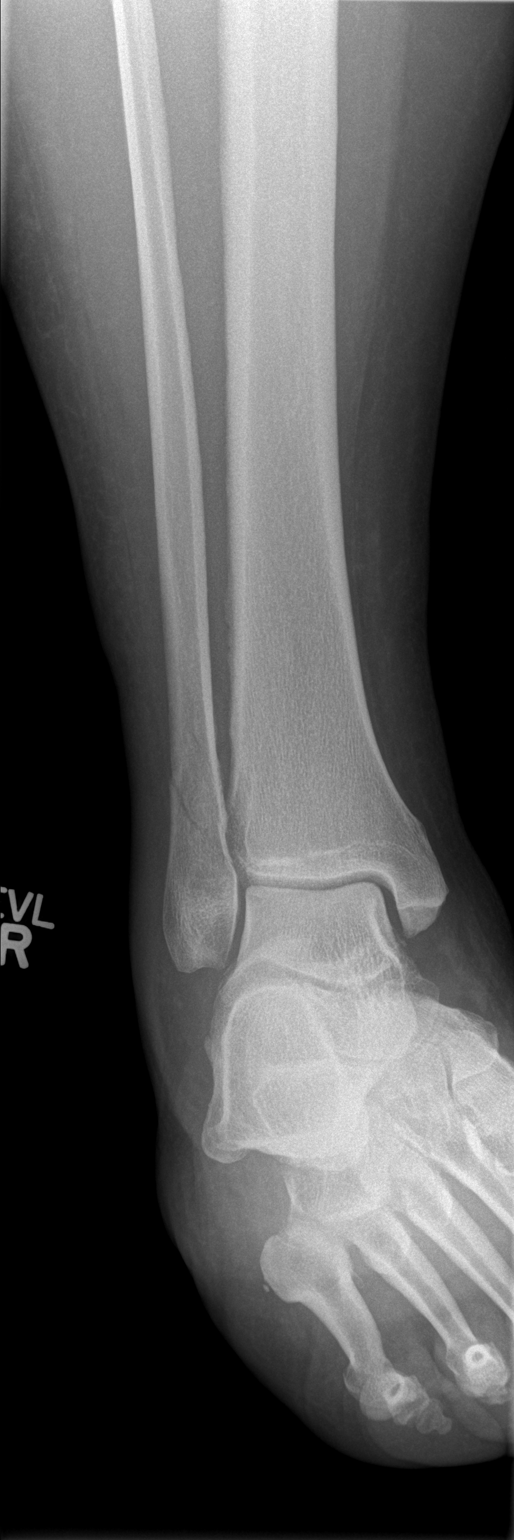

[t ankle joint lat right]
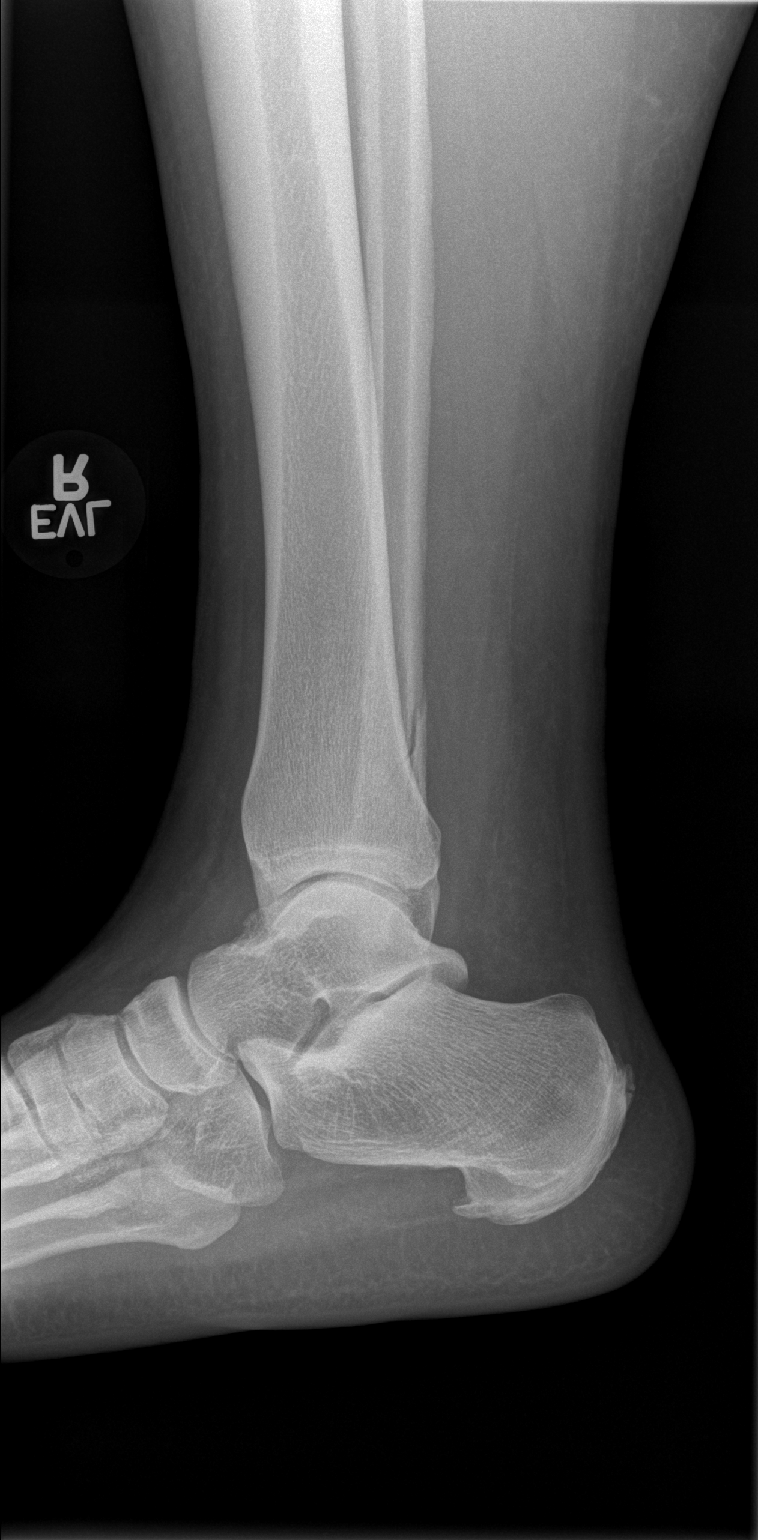

[3 of 3 positions shown; findings below may reference images not displayed]

FINDINGS: Nondisplaced oblique fracture through the distal fibular
metadiaphysis shows early periosteal reaction consistent with early
healing response. Alignment remains normal. No other fractures
identified. Talus is centered within the ankle mortise.
IMPRESSION: Early healing of nondisplaced distal fibular fracture.

## 2014-08-20 NOTE — Patient Instructions (Signed)
You have a distal fibula fracture. Wear cam walker every time you're up (ok to take off if sleeping, to wash area, when lying down). Ice 15 minutes at a time 3-4 times a day. Elevate above your heart when possible. Ibuprofen or aleve as needed for pain and inflammation. Norco as needed for severe pain. Follow up with me in about 2 weeks - we will repeat your x-rays again at 2 week intervals until we see bony healing (usually will occur at next visit or the one after that).

## 2014-08-24 NOTE — Assessment & Plan Note (Signed)
at and above level of ankle joint.  Discussed higher risk of nonunion with this type.  No displacement on repeat radiographs.  Continue cam walker.  Icing, elevation, nsaids, norco if needed.  F/u in 2 weeks - repeat x-rays.

## 2014-08-24 NOTE — Progress Notes (Signed)
PCP: No primary care provider on file.  Subjective:   HPI: Patient is a 44 y.o. female here for right leg injury.  5/27: Patient reports she was roller skating on 5/22 when she hit the wall, injured right ankle. Felt a pop lateral right ankle. Unable to bear weight after this. + swelling. Feels a burning sensation as well. Radiographs showed distal fibula fracture with extension into the joint. Placed in a splint with crutches.  6/10: Patient reports pain is at 4/10 level. Wearing cam walker, elevating, icing. Taking norco as needed.  Past Medical History  Diagnosis Date  . Arthritis     Current Outpatient Prescriptions on File Prior to Visit  Medication Sig Dispense Refill  . HYDROcodone-acetaminophen (NORCO) 5-325 MG per tablet Take 1 tablet by mouth every 6 (six) hours as needed for moderate pain. 60 tablet 0  . ibuprofen (ADVIL,MOTRIN) 600 MG tablet Take 1 tablet (600 mg total) by mouth every 6 (six) hours as needed. 30 tablet 0   No current facility-administered medications on file prior to visit.    Past Surgical History  Procedure Laterality Date  . Tubal ligation    . Cholecystectomy      No Known Allergies  History   Social History  . Marital Status: Single    Spouse Name: N/A  . Number of Children: N/A  . Years of Education: N/A   Occupational History  . Not on file.   Social History Main Topics  . Smoking status: Current Every Day Smoker -- 0.00 packs/day    Types: Cigarettes  . Smokeless tobacco: Not on file  . Alcohol Use: No  . Drug Use: No  . Sexual Activity: Not on file   Other Topics Concern  . Not on file   Social History Narrative    No family history on file.  BP 108/72 mmHg  Pulse 75  Ht 5\' 4"  (1.626 m)  Wt 260 lb (117.935 kg)  BMI 44.61 kg/m2  LMP 08/20/2014  Review of Systems: See HPI above.    Objective:  Physical Exam:  Gen: NAD  Right ankle: Mild lateral swelling.  No bruising, other deformity. Did not  test ROM with known fracture. TTP lateral malleolus.  No navicular, medial mallolar, base 5th, fibular head, other tenderness. Deferred ant drawer and talar tilt.   Thompsons test negative. NV intact distally.    Assessment & Plan:  1. Right distal fibula fracture - at and above level of ankle joint.  Discussed higher risk of nonunion with this type.  No displacement on repeat radiographs.  Continue cam walker.  Icing, elevation, nsaids, norco if needed.  F/u in 2 weeks - repeat x-rays.

## 2014-09-06 ENCOUNTER — Ambulatory Visit (HOSPITAL_BASED_OUTPATIENT_CLINIC_OR_DEPARTMENT_OTHER)
Admission: RE | Admit: 2014-09-06 | Discharge: 2014-09-06 | Disposition: A | Discharge status: Home / Self Care | Payer: No Typology Code available for payment source | Source: Ambulatory Visit | Attending: Family Medicine | Admitting: Family Medicine

## 2014-09-06 ENCOUNTER — Ambulatory Visit (HOSPITAL_COMMUNITY)
Admission: RE | Admit: 2014-09-06 | Discharge: 2014-09-06 | Disposition: A | Discharge status: Home / Self Care | Payer: No Typology Code available for payment source | Source: Ambulatory Visit | Attending: Family Medicine | Admitting: Family Medicine

## 2014-09-06 ENCOUNTER — Other Ambulatory Visit: Payer: Self-pay | Admitting: Family Medicine

## 2014-09-06 ENCOUNTER — Ambulatory Visit (INDEPENDENT_AMBULATORY_CARE_PROVIDER_SITE_OTHER): Payer: No Typology Code available for payment source | Admitting: Family Medicine

## 2014-09-06 ENCOUNTER — Encounter: Payer: Self-pay | Admitting: Family Medicine

## 2014-09-06 ENCOUNTER — Ambulatory Visit (HOSPITAL_BASED_OUTPATIENT_CLINIC_OR_DEPARTMENT_OTHER): Admission: RE | Admit: 2014-09-06 | Payer: No Typology Code available for payment source | Source: Ambulatory Visit

## 2014-09-06 VITALS — BP 122/80 | HR 82 | Ht 64.0 in

## 2014-09-06 DIAGNOSIS — S82401D Unspecified fracture of shaft of right fibula, subsequent encounter for closed fracture with routine healing: Secondary | ICD-10-CM

## 2014-09-06 DIAGNOSIS — T148XXA Other injury of unspecified body region, initial encounter: Secondary | ICD-10-CM

## 2014-09-06 DIAGNOSIS — S82831D Other fracture of upper and lower end of right fibula, subsequent encounter for closed fracture with routine healing: Secondary | ICD-10-CM | POA: Insufficient documentation

## 2014-09-06 DIAGNOSIS — X58XXXD Exposure to other specified factors, subsequent encounter: Secondary | ICD-10-CM | POA: Insufficient documentation

## 2014-09-06 IMAGING — DX DG ANKLE COMPLETE 3+V*R*
3 series · 3 of 3 positions shown · non-contrast
Comparison: [DATE] and [DATE]

CLINICAL DATA: Subsequent followup of healing of fracture.

EXAM:
RIGHT ANKLE - COMPLETE 3+ VIEW

[ankle ap]
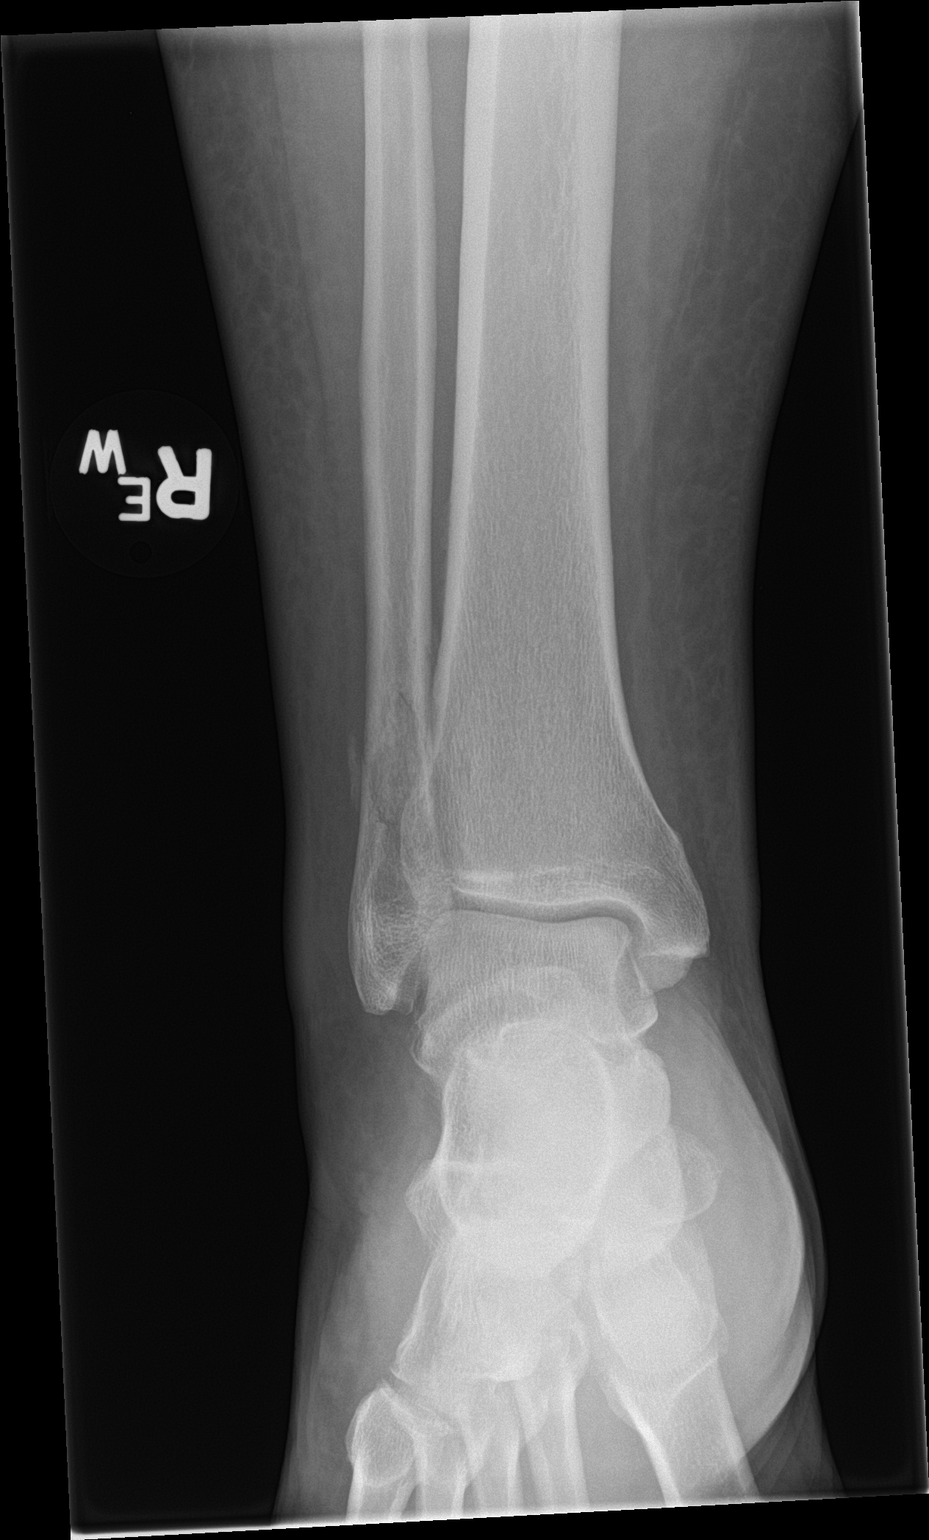

[ankle obl]
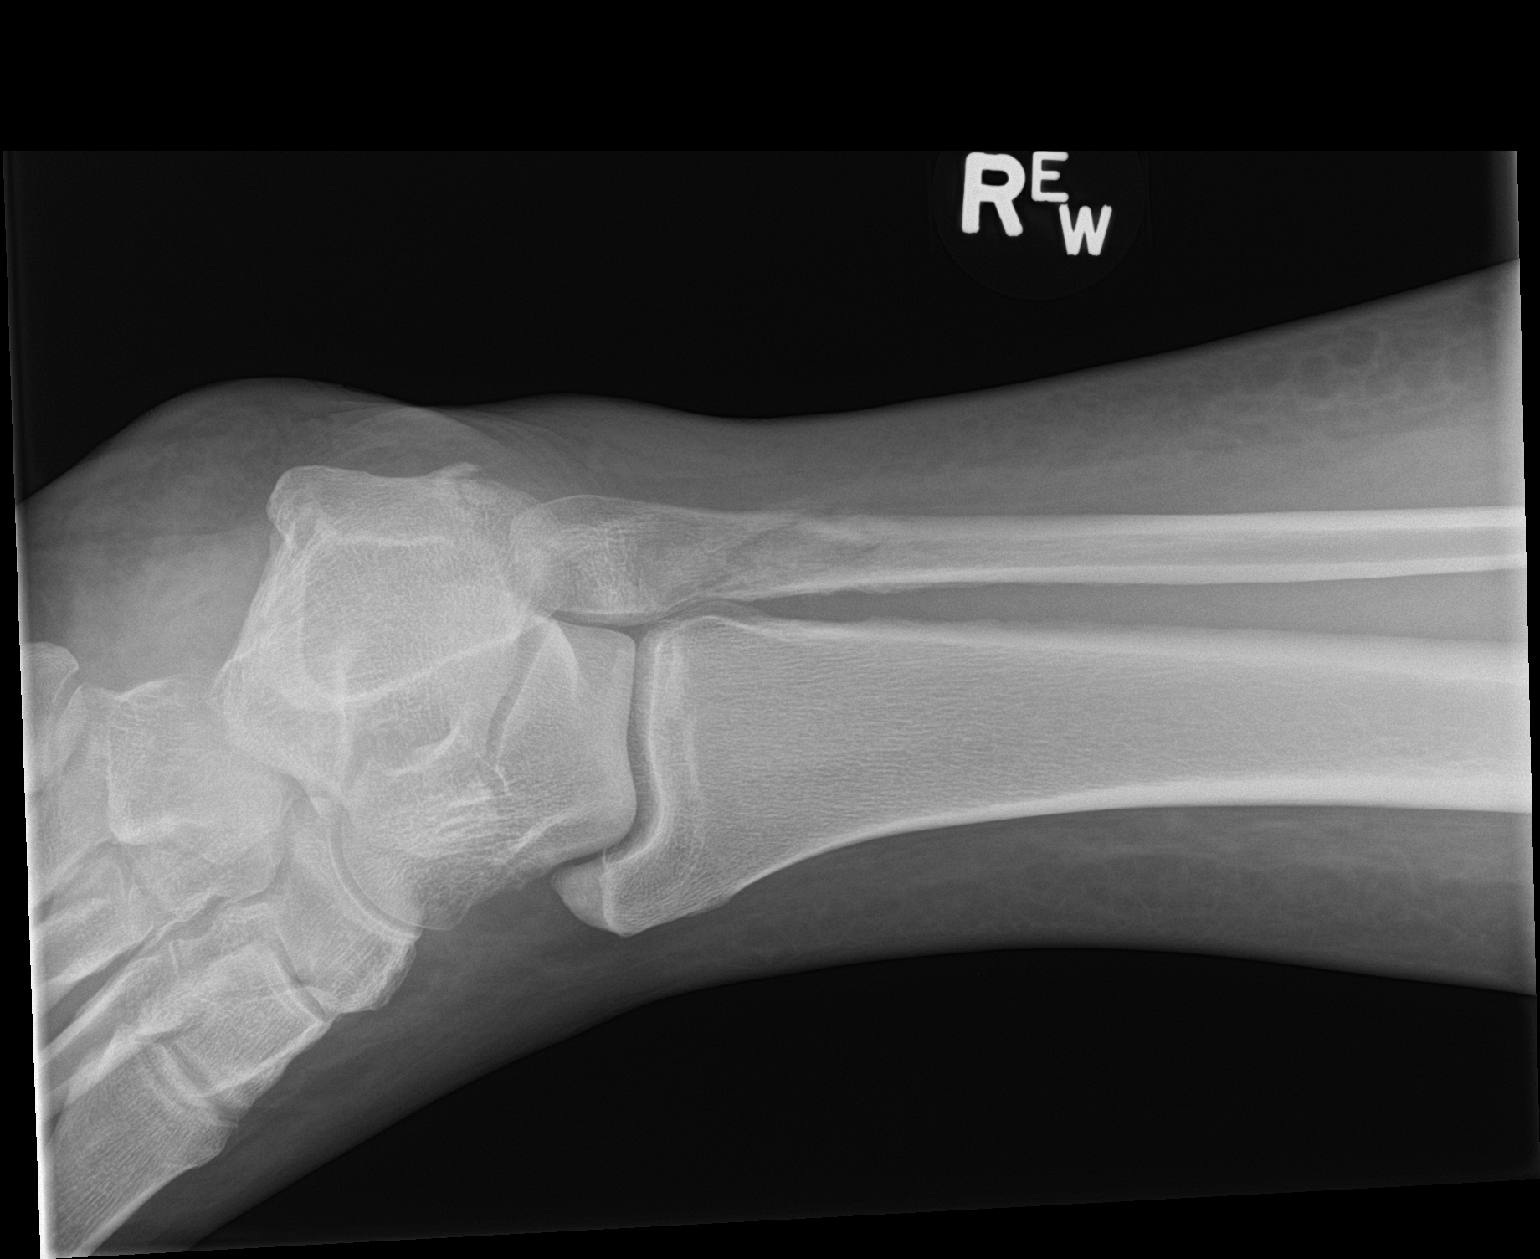

[ankle lat]
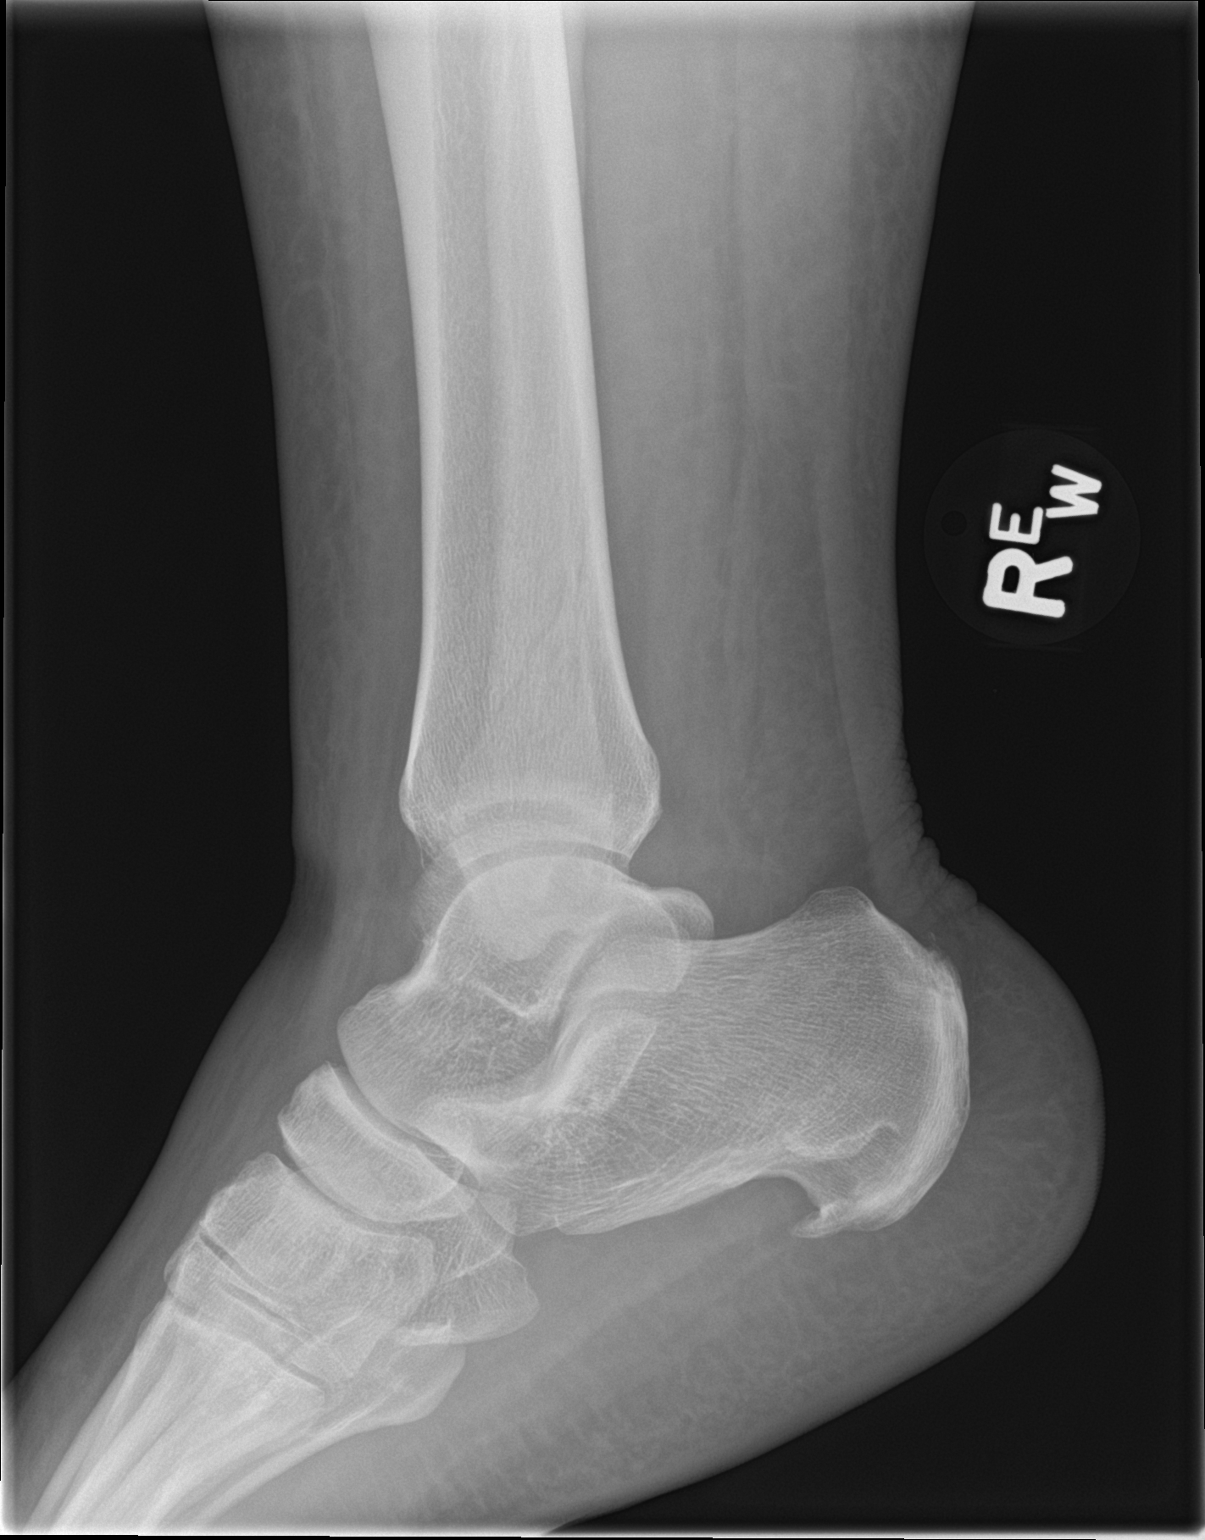

[3 of 3 positions shown; findings below may reference images not displayed]

FINDINGS: Small Achilles and calcaneal spurs. Mild callus deposition across
the previously described minimally displaced distal fibular
fracture. Fracture lines remain visible. No new fracture identified.
Base of fifth metatarsal and talar dome intact.
IMPRESSION: Healing of distal fibular fracture.

## 2014-09-06 NOTE — Patient Instructions (Signed)
Laceup brace or ace wrap especially when working, for swelling for next 3 weeks. Icing, elevation as needed. Do easy motion exercises of your ankle to regain full motion. Follow up with me in 3 weeks.

## 2014-09-06 NOTE — Progress Notes (Signed)
PCP: No primary care provider on file.  Subjective:   HPI: Patient is a 44 y.o. female here for right leg injury.  5/27: Patient reports she was roller skating on 5/22 when she hit the wall, injured right ankle. Felt a pop lateral right ankle. Unable to bear weight after this. + swelling. Feels a burning sensation as well. Radiographs showed distal fibula fracture with extension into the joint. Placed in a splint with crutches.  6/10: Patient reports pain is at 4/10 level. Wearing cam walker, elevating, icing. Taking norco as needed.  6/27: Patient reports she is doing well. No longer using cam walker. Foot still swelling and itching laterally. Pain worse at end of day after working 2 jobs.  Past Medical History  Diagnosis Date  . Arthritis     Current Outpatient Prescriptions on File Prior to Visit  Medication Sig Dispense Refill  . HYDROcodone-acetaminophen (NORCO) 5-325 MG per tablet Take 1 tablet by mouth every 6 (six) hours as needed for moderate pain. 60 tablet 0  . ibuprofen (ADVIL,MOTRIN) 600 MG tablet Take 1 tablet (600 mg total) by mouth every 6 (six) hours as needed. 30 tablet 0   No current facility-administered medications on file prior to visit.    Past Surgical History  Procedure Laterality Date  . Tubal ligation    . Cholecystectomy      No Known Allergies  History   Social History  . Marital Status: Single    Spouse Name: N/A  . Number of Children: N/A  . Years of Education: N/A   Occupational History  . Not on file.   Social History Main Topics  . Smoking status: Current Every Day Smoker -- 0.00 packs/day    Types: Cigarettes  . Smokeless tobacco: Not on file  . Alcohol Use: No  . Drug Use: No  . Sexual Activity: Not on file   Other Topics Concern  . Not on file   Social History Narrative    No family history on file.  BP 122/80 mmHg  Pulse 82  Ht 5\' 4"  (1.626 m)  LMP 08/20/2014  Review of Systems: See HPI above.     Objective:  Physical Exam:  Gen: NAD  Right ankle: Mild lateral swelling.  No bruising, other deformity. Mild limitation ROM all directions. Mild TTP lateral malleolus.  No navicular, medial mallolar, base 5th, fibular head, other tenderness. Negative ant drawer and talar tilt.   Thompsons test negative. NV intact distally.    Assessment & Plan:  1. Right distal fibula fracture - at and above level of ankle joint.  Radiographs show presence of callus over fracture site.  Clinically improving.  Ok that she has discontinued boot at this point.  Icing, elevation, tylenol/nsaids if needed.  F/u in 3 weeks for reevaluation.

## 2014-09-06 NOTE — Assessment & Plan Note (Signed)
Right distal fibula fracture - at and above level of ankle joint.  Radiographs show presence of callus over fracture site.  Clinically improving.  Ok that she has discontinued boot at this point.  Icing, elevation, tylenol/nsaids if needed.  F/u in 3 weeks for reevaluation.

## 2014-09-07 ENCOUNTER — Telehealth: Payer: Self-pay | Admitting: Family Medicine

## 2014-09-07 MED ORDER — HYDROCODONE-ACETAMINOPHEN 5-325 MG PO TABS: 1.0000 | ORAL_TABLET | Freq: Four times a day (QID) | ORAL | Status: DC | PRN

## 2014-09-07 NOTE — Telephone Encounter (Signed)
Called patient and told her that prescription would be up front to pick up and we do not refill this medication.

## 2014-09-07 NOTE — Telephone Encounter (Signed)
Printed out medication.  She cannot work on this medicine though (would have to take after getting home) and we would not refill it after this prescription.

## 2014-09-27 ENCOUNTER — Ambulatory Visit: Payer: No Typology Code available for payment source | Admitting: Family Medicine

## 2015-05-14 ENCOUNTER — Encounter (HOSPITAL_BASED_OUTPATIENT_CLINIC_OR_DEPARTMENT_OTHER): Payer: Self-pay | Admitting: Emergency Medicine

## 2015-05-14 ENCOUNTER — Emergency Department (HOSPITAL_BASED_OUTPATIENT_CLINIC_OR_DEPARTMENT_OTHER)
Admission: EM | Admit: 2015-05-14 | Discharge: 2015-05-15 | Disposition: A | Discharge status: Home / Self Care | Payer: Self-pay | Attending: Emergency Medicine | Admitting: Emergency Medicine

## 2015-05-14 DIAGNOSIS — J209 Acute bronchitis, unspecified: Secondary | ICD-10-CM | POA: Insufficient documentation

## 2015-05-14 DIAGNOSIS — M79643 Pain in unspecified hand: Secondary | ICD-10-CM | POA: Insufficient documentation

## 2015-05-14 DIAGNOSIS — R110 Nausea: Secondary | ICD-10-CM | POA: Insufficient documentation

## 2015-05-14 DIAGNOSIS — F17210 Nicotine dependence, cigarettes, uncomplicated: Secondary | ICD-10-CM | POA: Insufficient documentation

## 2015-05-14 DIAGNOSIS — R200 Anesthesia of skin: Secondary | ICD-10-CM | POA: Insufficient documentation

## 2015-05-14 NOTE — ED Notes (Signed)
Pt in c/o body aches x 3 days, denies fever. States L hand is intermittently numb, also hurts in L chest only on inspiration.

## 2015-05-15 ENCOUNTER — Emergency Department (HOSPITAL_BASED_OUTPATIENT_CLINIC_OR_DEPARTMENT_OTHER): Payer: Self-pay

## 2015-05-15 IMAGING — CR DG CHEST 2V
2 series · 2 of 2 positions shown · non-contrast
Comparison: None.

CLINICAL DATA: Acute onset of right-sided chest pain, cough and
difficulty taking deep breaths. Initial encounter.

EXAM:
CHEST  2 VIEW

[w chest pa]
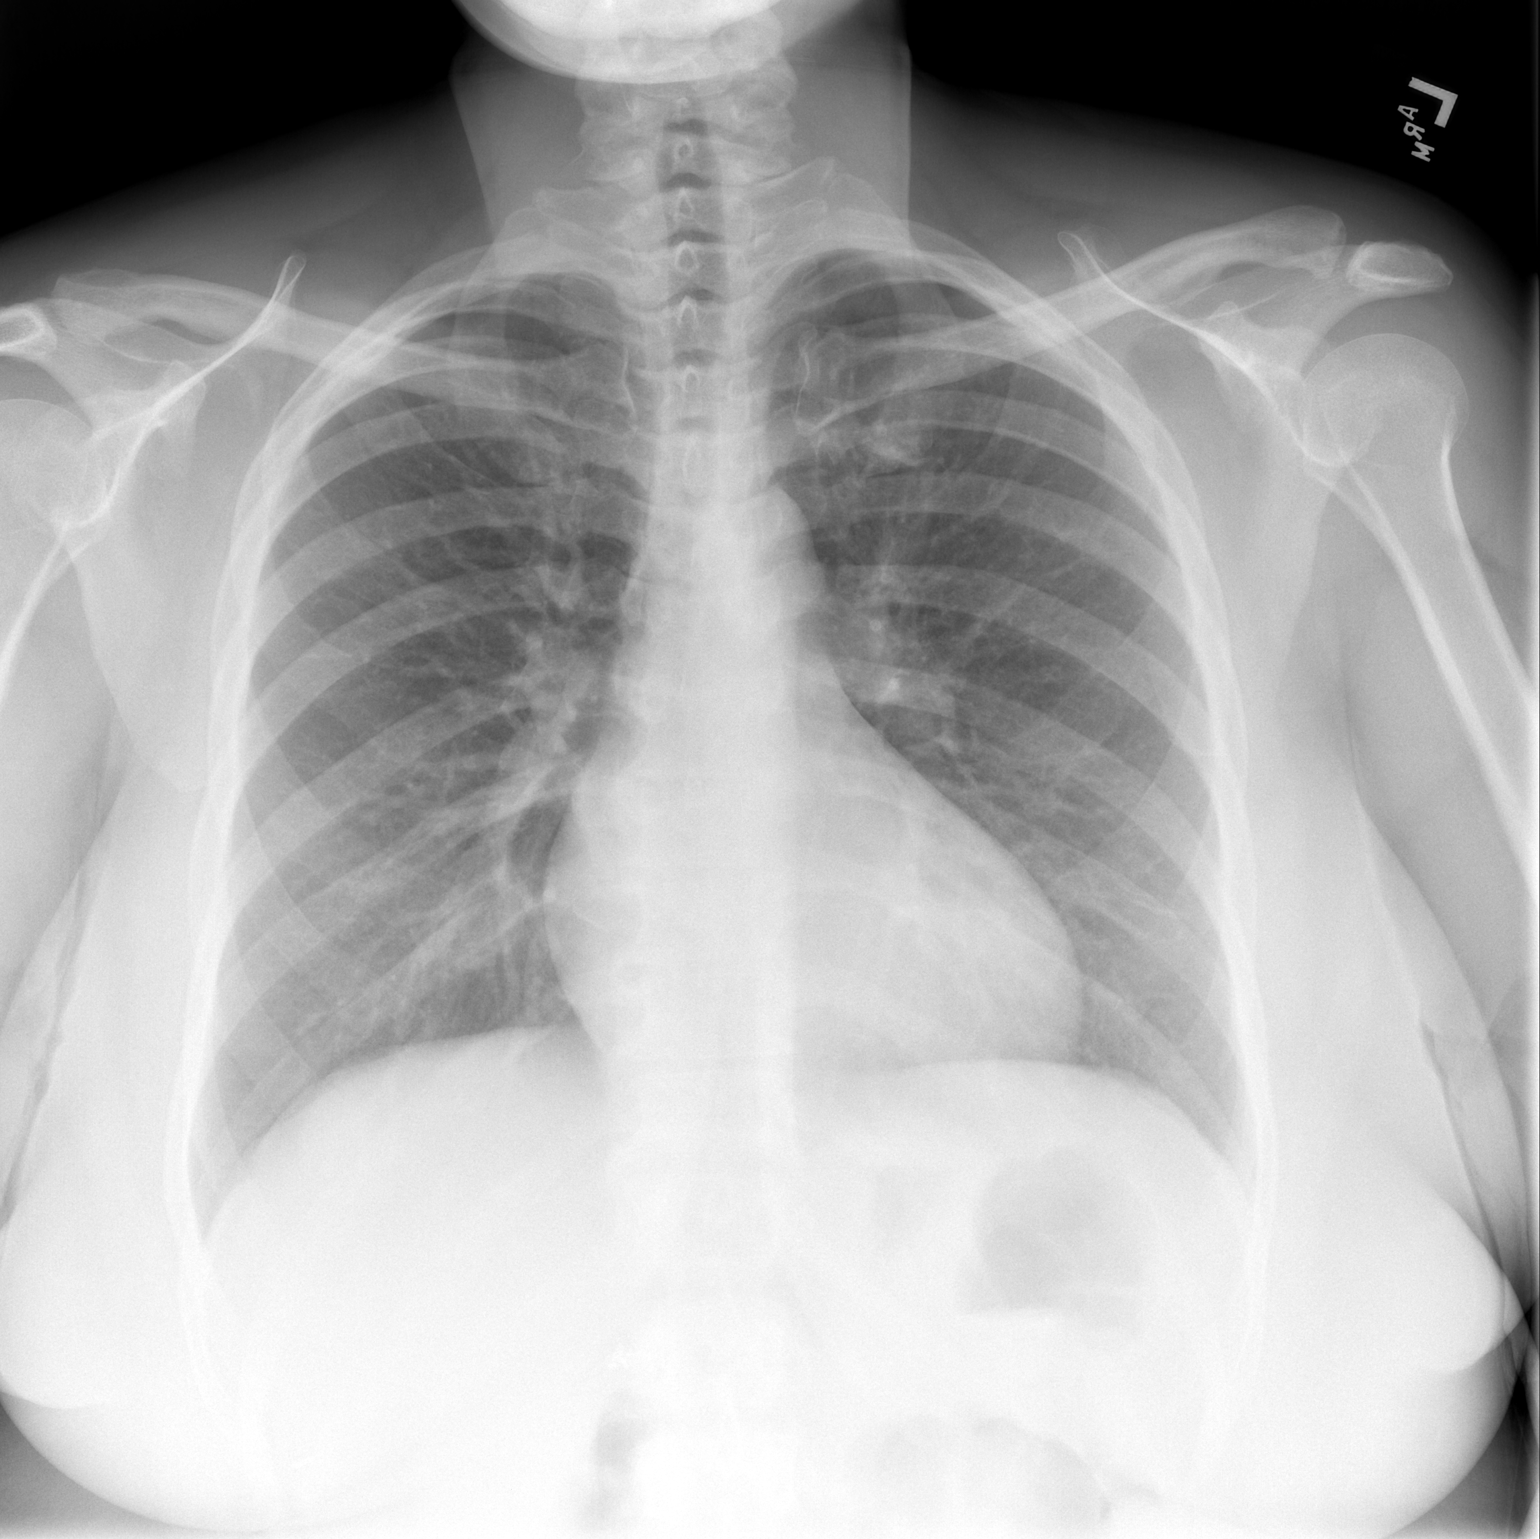

[w chest lat]
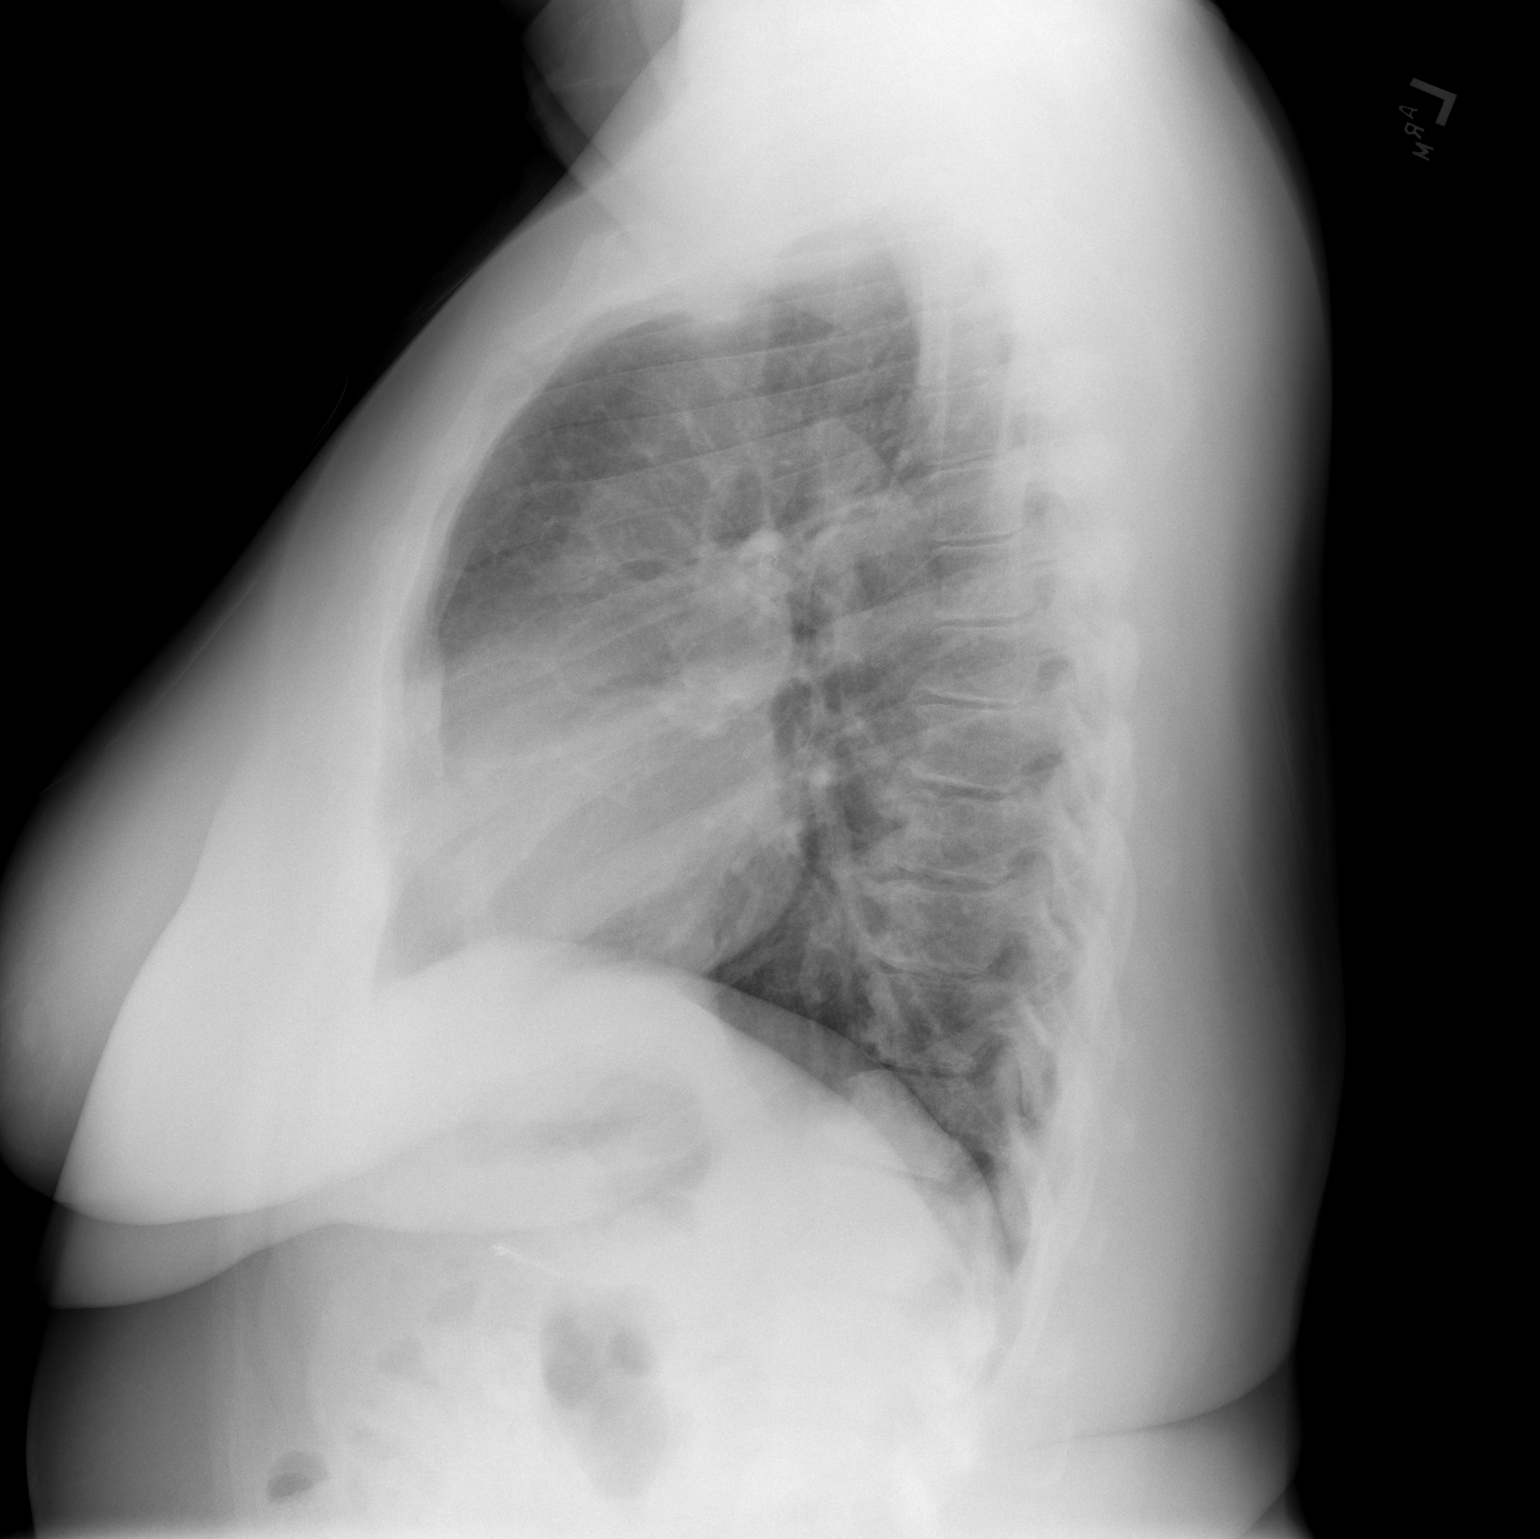

[2 of 2 positions shown; findings below may reference images not displayed]

FINDINGS: The lungs are well-aerated and clear. There is no evidence of focal
opacification, pleural effusion or pneumothorax.

The heart is normal in size; the mediastinal contour is within
normal limits. No acute osseous abnormalities are seen. Clips are
noted within the right upper quadrant, reflecting prior
cholecystectomy.
IMPRESSION: No acute cardiopulmonary process seen.

## 2015-05-15 MED ORDER — ALBUTEROL SULFATE HFA 108 (90 BASE) MCG/ACT IN AERS
1.0000 | INHALATION_SPRAY | RESPIRATORY_TRACT | Status: DC | PRN
  Filled 2015-05-15 (×2): qty 6.7

## 2015-05-15 MED ORDER — IPRATROPIUM-ALBUTEROL 0.5-2.5 (3) MG/3ML IN SOLN
3.0000 mL | RESPIRATORY_TRACT | Status: DC
  Administered 2015-05-15: 3 mL via RESPIRATORY_TRACT
  Filled 2015-05-15: qty 3

## 2015-05-15 NOTE — ED Notes (Addendum)
Pt reports "hurst to breath on Right side," all over body aches and headache x3 days. Pt also reports Right hand numbness "for no reason." Pt reports :coughing up mucus" unsure of color of mucus, states "feels yucky."  Pt also reports feeling tired all the time.

## 2015-05-15 NOTE — ED Provider Notes (Signed)
CSN: [id number]     Arrival date & time 05/14/15  2212 History  By signing my name below, I, Jenna [name], attest that this documentation has been prepared under the direction and in the presence of Paula Libra, MD. Electronically Signed: Bethel [name], ED Scribe. 05/15/2015. 12:47 AM  Chief Complaint  Patient presents with  . Generalized Body Aches    The history is provided by the patient. No language interpreter was used.   Jenna [name] is a 45 y.o. female who presents to the Emergency Department complaining of new and moderate pain at the right side of the chest with onset 3 days ago. The pain is worse with coughing and deep breathing. Pt states that it is hard to take a deep breath. Associated symptoms include mild cough with onset earlier today, and nausea. She also complains of intermittent frontal headaches that have been occurring for months.   She also has intermittent right hand numbness for months. The sensation starts as an aching in the wrist and then the "whole hand" goes numb.  Pt denies fever, vomiting, diarrhea, and dysuria.   Past Medical History  Diagnosis Date  . Arthritis    Past Surgical History  Procedure Laterality Date  . Tubal ligation    . Cholecystectomy     History reviewed. No pertinent family history. Social History  Substance Use Topics  . Smoking status: Current Every Day Smoker -- 0.00 packs/day    Types: Cigarettes  . Smokeless tobacco: None  . Alcohol Use: No   OB History    No data available     Review of Systems  10 Systems reviewed and all are negative for acute change except as noted in the HPI.  Allergies  Review of patient's allergies indicates no known allergies.  Home Medications   Prior to Admission medications   Medication Sig Start Date End Date Taking? Authorizing Provider  HYDROcodone-acetaminophen (NORCO) 5-325 MG per tablet Take 1 tablet by mouth every 6 (six) hours as needed for moderate pain. 09/07/14   Lenda Kelp, MD  ibuprofen (ADVIL,MOTRIN) 600 MG tablet Take 1 tablet (600 mg total) by mouth every 6 (six) hours as needed. 08/01/14   Zadie Rhine, MD   BP 135/85 mmHg  Pulse 89  Temp(Src) 98.8 F (37.1 C) (Oral)  Resp 18  Ht  (1.626 m)  Wt 258 lb (117.028 kg)  BMI 44.26 kg/m2  SpO2 99% Physical Exam  Nursing note and vitals reviewed. General: Well-developed, well-nourished female in no acute distress; appearance consistent with age of record HENT: normocephalic; atraumatic Eyes: pupils equal, round and reactive to light; extraocular muscles intact Neck: supple Heart: regular rate and rhythm Lungs: diminished in bases Chest: Nontender Abdomen: soft; nondistended; nontender; bowel sounds present Extremities: No deformity; full range of motion; pulses normal Neurologic: Awake, alert and oriented; motor function intact in all extremities and symmetric; no facial droop; sensation intact in right hand Skin: Warm and dry Psychiatric: Normal mood and affect  ED Course  Procedures (including critical care time) DIAGNOSTIC STUDIES: Oxygen Saturation is 99% on RA,  normal by my interpretation.    COORDINATION OF CARE: 12:45 AM Discussed treatment plan which includes CXR with pt at bedside and pt agreed to plan.   MDM  Nursing notes and vitals signs, including pulse oximetry, reviewed.  Summary of this visit's results, reviewed by myself:  Imaging Studies: Dg Chest 2 View  05/15/2015  CLINICAL DATA:  Acute onset of right-sided chest pain, cough  and difficulty taking deep breaths. Initial encounter. EXAM: CHEST  2 VIEW COMPARISON:  None. FINDINGS: The lungs are well-aerated and clear. There is no evidence of focal opacification, pleural effusion or pneumothorax. The heart is normal in size; the mediastinal contour is within normal limits. No acute osseous abnormalities are seen. Clips are noted within the right upper quadrant, reflecting prior cholecystectomy. IMPRESSION: No acute  cardiopulmonary process seen. Electronically Signed   By: Roanna RaiderJeffery  Chang M.D.   On: 05/15/2015 01:32   1:47 AM Patient feels much better, is no longer having chest pain, air movement significantly improved after DuoNeb treatment. The patient's intermittent hand numbness may represent carpal tunnel syndrome although she had a negative Tinel's and negative Phalen's on my exam. We will refer to hand surgery.  I personally performed the services described in this documentation, which was scribed in my presence. The recorded information has been reviewed and is accurate.   Paula LibraJohn Ousman Dise, MD 05/15/15 [id number]

## 2015-05-15 NOTE — ED Notes (Addendum)
Pt verbalies understanding of inhaler. Pt states, "I use them when I'm sick."

## 2015-05-15 NOTE — ED Notes (Signed)
Pt d/c home w/all belongings, pt a,ert, oriented, and ambulatory upon d/c. No narcotics given in ED

## 2015-05-15 NOTE — Discharge Instructions (Signed)

## 2015-06-16 ENCOUNTER — Emergency Department (HOSPITAL_BASED_OUTPATIENT_CLINIC_OR_DEPARTMENT_OTHER): Payer: Self-pay

## 2015-06-16 ENCOUNTER — Emergency Department (HOSPITAL_BASED_OUTPATIENT_CLINIC_OR_DEPARTMENT_OTHER)
Admission: EM | Admit: 2015-06-16 | Discharge: 2015-06-16 | Disposition: A | Discharge status: Home / Self Care | Payer: Self-pay | Attending: Emergency Medicine | Admitting: Emergency Medicine

## 2015-06-16 ENCOUNTER — Encounter (HOSPITAL_BASED_OUTPATIENT_CLINIC_OR_DEPARTMENT_OTHER): Payer: Self-pay

## 2015-06-16 DIAGNOSIS — M25559 Pain in unspecified hip: Secondary | ICD-10-CM

## 2015-06-16 DIAGNOSIS — M1612 Unilateral primary osteoarthritis, left hip: Secondary | ICD-10-CM | POA: Insufficient documentation

## 2015-06-16 DIAGNOSIS — F17210 Nicotine dependence, cigarettes, uncomplicated: Secondary | ICD-10-CM | POA: Insufficient documentation

## 2015-06-16 DIAGNOSIS — M545 Low back pain: Secondary | ICD-10-CM | POA: Insufficient documentation

## 2015-06-16 IMAGING — DX DG HIP (WITH OR WITHOUT PELVIS) 2-3V*L*
4 series · 4 of 4 positions shown · non-contrast
Comparison: None.

CLINICAL DATA: Left flank pain.  No trauma.  Duration 5 days.

EXAM:
DG HIP (WITH OR WITHOUT PELVIS) 2-3V LEFT

[pelvis ap (1 of 2)]
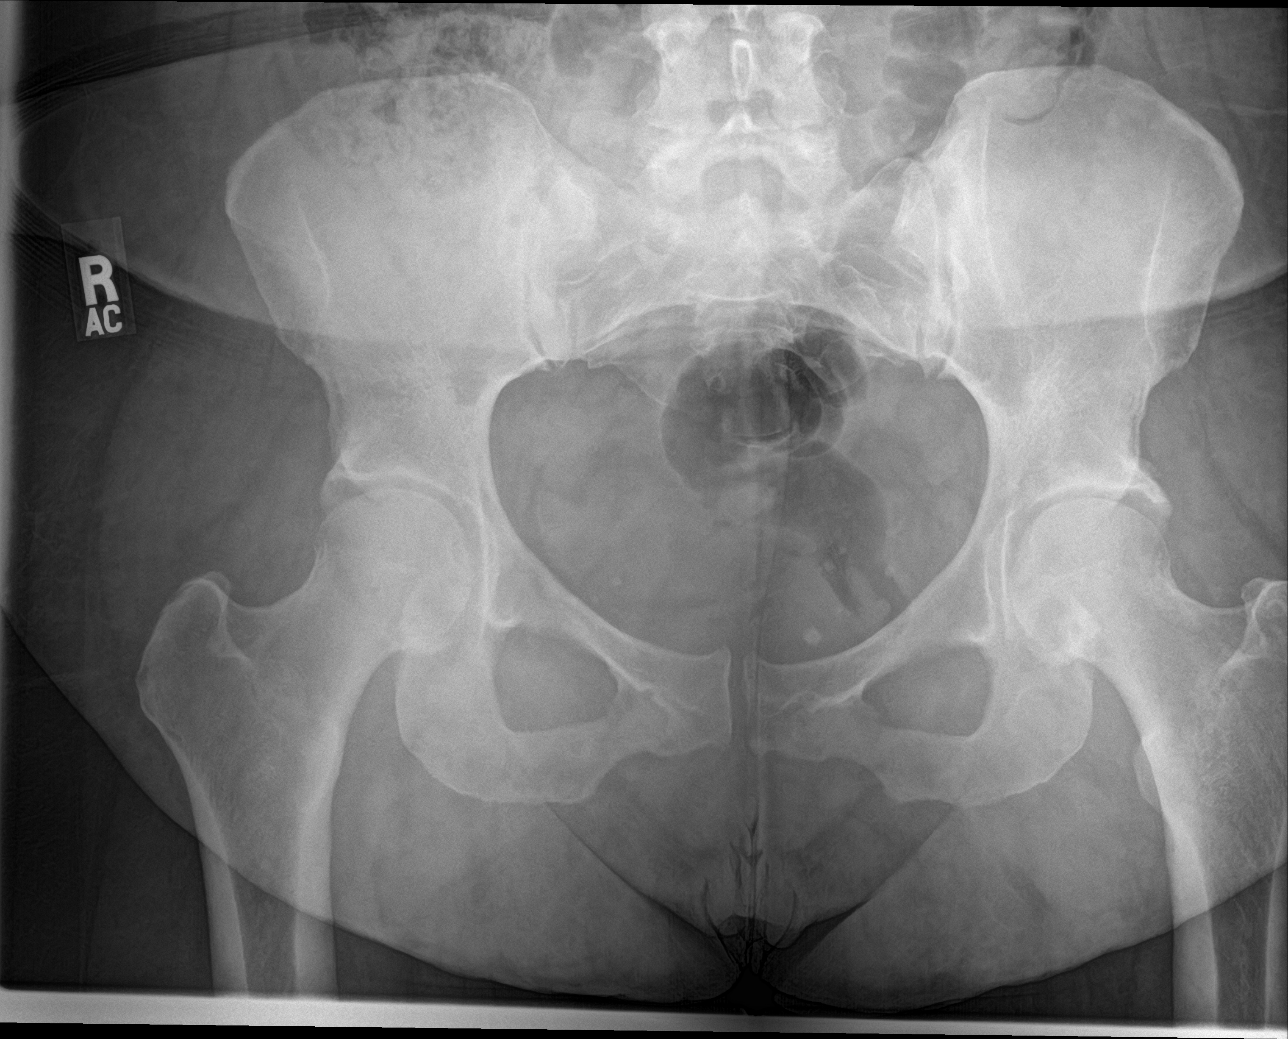

[hip ap]
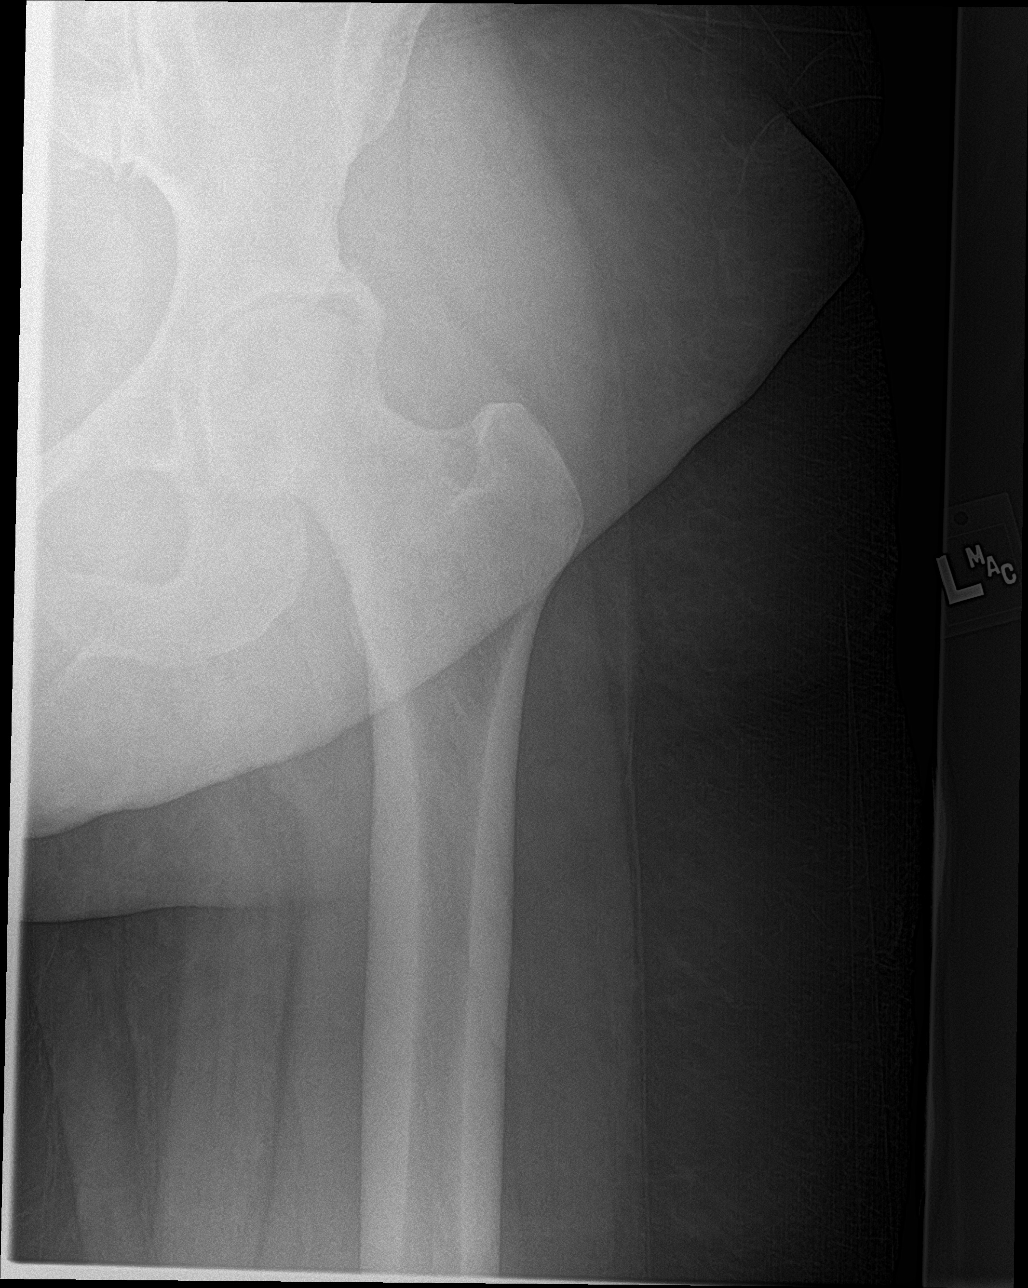

[hip lat]
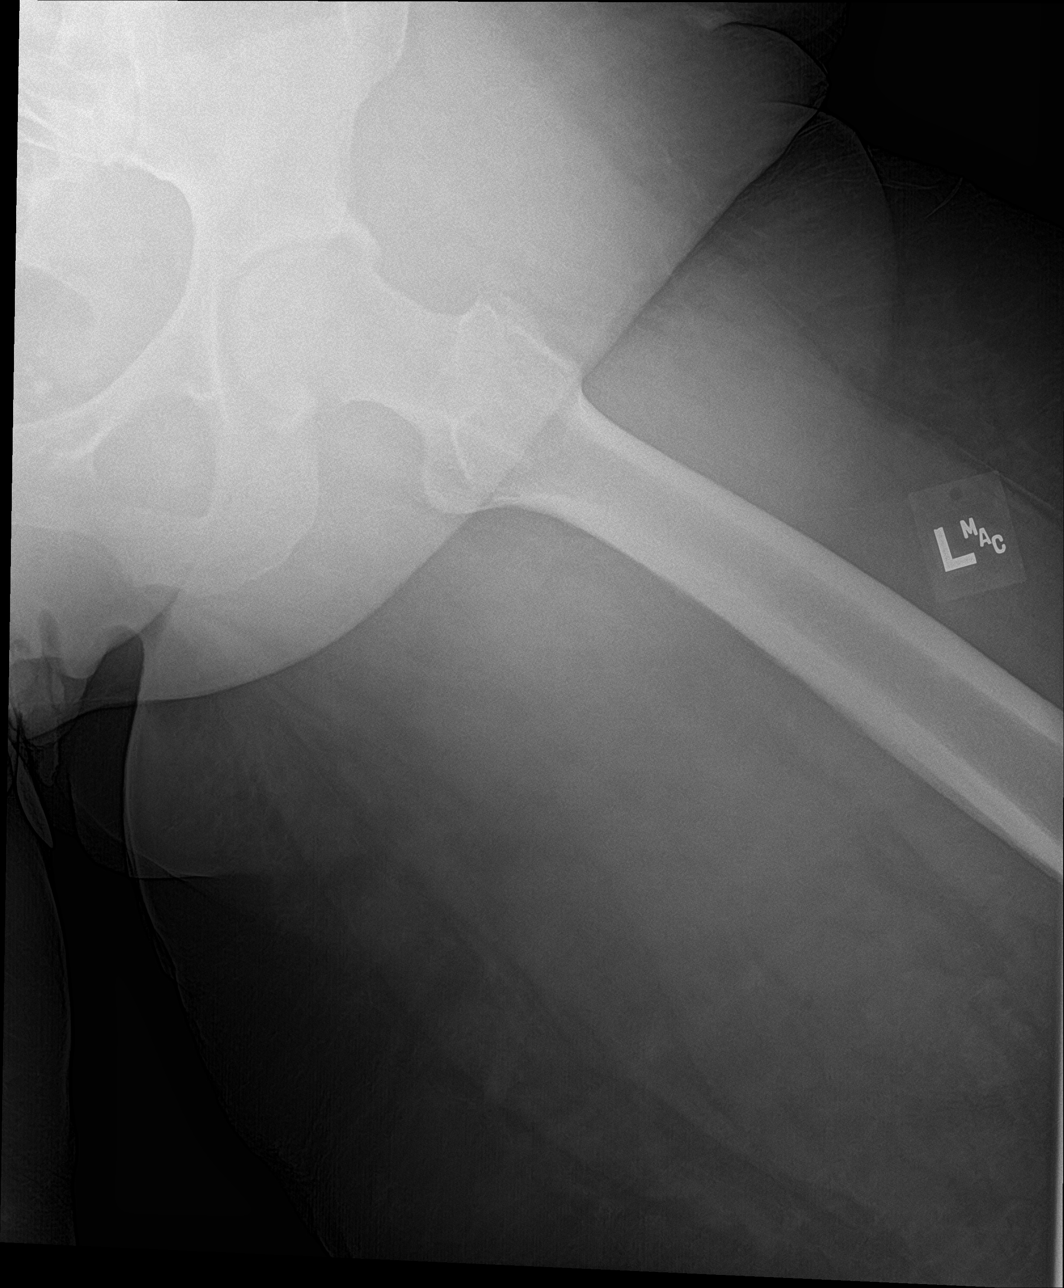

[pelvis ap (2 of 2)]
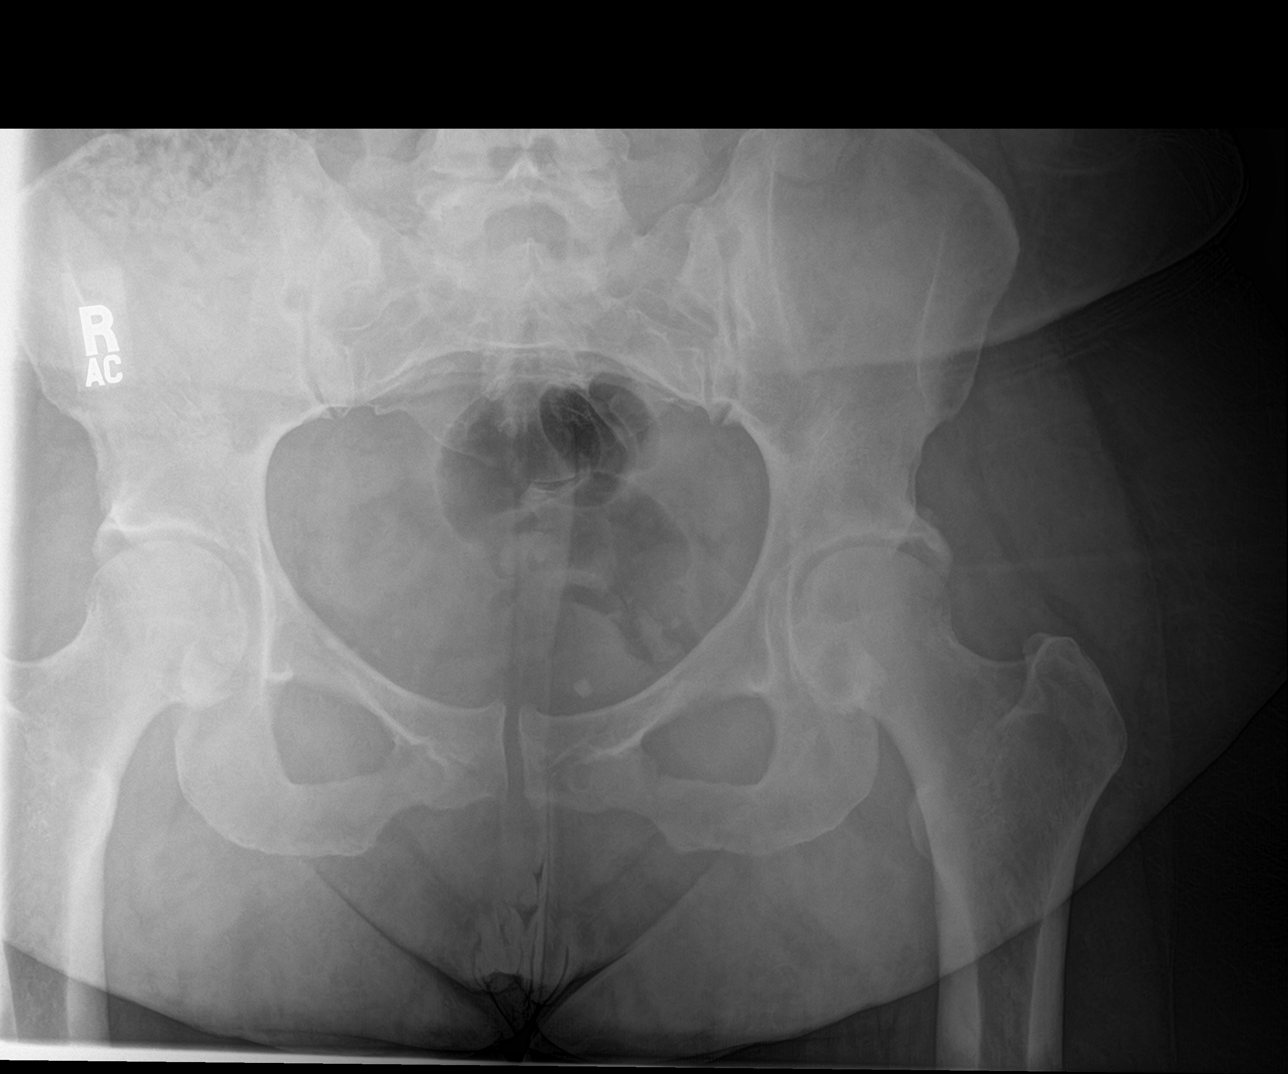

[4 of 4 positions shown; findings below may reference images not displayed]

FINDINGS: Negative for fracture dislocation. Moderate arthritic changes are
present about the left hip with subchondral sclerosis and osteophyte
formation. There is no bone lesion or bony destruction. Sacroiliac
joints appear unremarkable.
IMPRESSION: Left hip arthritis.  No acute findings are evident.

## 2015-06-16 NOTE — ED Notes (Signed)
Pt c/o left lower back pain that radiates to her knee since Sunday, no known injury

## 2015-06-16 NOTE — ED Provider Notes (Signed)
CSN: [id number]     Arrival date & time 06/16/15  2153 History   First MD Initiated Contact with Patient 06/16/15 2225     Chief Complaint  Patient presents with  . Back Pain    HPI   45 year old female presents today with complaints of left hip pain. Patient reports a history of left knee osteoarthritis is caused significant pain with ambulation. She reports symptoms in her hip feels similar and have started progress over the last week. She denies any swelling or edema, warmth, fever, chills, nausea, vomiting, abdominal pain or back pain. Patient reports she has taken ibuprofen and Aleve at home with minor symptomatic improvement. Patient denies any trauma      Past Medical History  Diagnosis Date  . Arthritis    Past Surgical History  Procedure Laterality Date  . Tubal ligation    . Cholecystectomy     No family history on file. Social History  Substance Use Topics  . Smoking status: Current Every Day Smoker -- 0.00 packs/day    Types: Cigarettes  . Smokeless tobacco: None  . Alcohol Use: No   OB History    No data available     Review of Systems  All other systems reviewed and are negative.   Allergies  Review of patient's allergies indicates no known allergies.  Home Medications   Prior to Admission medications   Medication Sig Start Date End Date Taking? Authorizing Provider  HYDROcodone-acetaminophen (NORCO) 5-325 MG per tablet Take 1 tablet by mouth every 6 (six) hours as needed for moderate pain. Patient not taking: Reported on 06/16/2015 09/07/14   Lenda KelpShane R Hudnall, MD  ibuprofen (ADVIL,MOTRIN) 600 MG tablet Take 1 tablet (600 mg total) by mouth every 6 (six) hours as needed. Patient not taking: Reported on 06/16/2015 08/01/14   Zadie Rhineonald Wickline, MD   BP 129/69 mmHg  Pulse 92  Temp(Src) 98.8 F (37.1 C) (Oral)  Resp 18  Ht 5\' 4"  (1.626 m)  Wt 120.203 kg  BMI 45.46 kg/m2  SpO2 100%  LMP 05/27/2015 Physical Exam  Constitutional: She is oriented to person,  place, and time. She appears well-developed and well-nourished.  HENT:  Head: Normocephalic and atraumatic.  Eyes: Conjunctivae are normal. Pupils are equal, round, and reactive to light. Right eye exhibits no discharge. Left eye exhibits no discharge. No scleral icterus.  Neck: Normal range of motion. No JVD present. No tracheal deviation present.  Pulmonary/Chest: Effort normal. No stridor.  Musculoskeletal:  Tenderness to palpation of the left lateral hip, posterior hip, no warmth to touch, full active range of motion, pain with flexion and extension  Neurological: She is alert and oriented to person, place, and time. Coordination normal.  Psychiatric: She has a normal mood and affect. Her behavior is normal. Judgment and thought content normal.  Nursing note and vitals reviewed.   ED Course  Procedures (including critical care time) Labs Review Labs Reviewed - No data to display  Imaging Review Dg Hip Unilat With Pelvis 2-3 Views Left  06/16/2015  CLINICAL DATA:  Left flank pain.  No trauma.  Duration 5 days. EXAM: DG HIP (WITH OR WITHOUT PELVIS) 2-3V LEFT COMPARISON:  None. FINDINGS: Negative for fracture dislocation. Moderate arthritic changes are present about the left hip with subchondral sclerosis and osteophyte formation. There is no bone lesion or bony destruction. Sacroiliac joints appear unremarkable. IMPRESSION: Left hip arthritis.  No acute findings are evident. Electronically Signed   By: Ellery Plunkaniel R Mitchell M.D.   On:  06/16/2015 23:26   I have personally reviewed and evaluated these images and lab results as part of my medical decision-making.   EKG Interpretation None      MDM   Final diagnoses:  Hip pain  Osteoarthritis of left hip, unspecified osteoarthritis type    Labs:  Imaging:  Consults:  Therapeutics:  Discharge Meds:   Assessment/Plan: 45 year old female presents today with complaints of hip pain. Patient has osteoarthritis on diagnostic imaging.  This is consistent with her presentation, I have low suspicion for septic or gouty arthritis. Patient will be instructed to use ibuprofen or Tylenol as needed for discomfort, follow-up with sports medicine specialist for further evaluation and management. Patient verbalized understanding and agreement today's plan no further questions or concerns at time of discharge        Eyvonne Mechanic, PA-C 06/17/15 0121  Geoffery Lyons, MD 06/17/15 [phone]

## 2015-06-23 ENCOUNTER — Encounter: Payer: Self-pay | Admitting: Family Medicine

## 2015-06-23 ENCOUNTER — Ambulatory Visit (INDEPENDENT_AMBULATORY_CARE_PROVIDER_SITE_OTHER): Payer: Self-pay | Admitting: Family Medicine

## 2015-06-23 VITALS — BP 129/83 | HR 89 | Ht 64.0 in | Wt 262.0 lb

## 2015-06-23 DIAGNOSIS — M25562 Pain in left knee: Secondary | ICD-10-CM

## 2015-06-23 DIAGNOSIS — M25561 Pain in right knee: Secondary | ICD-10-CM

## 2015-06-23 MED ORDER — METHYLPREDNISOLONE ACETATE 40 MG/ML IJ SUSP
40.0000 mg | Freq: Once | INTRAMUSCULAR | Status: AC
Start: 2015-06-23 — End: 2015-06-23
  Administered 2015-06-23: 40 mg via INTRA_ARTICULAR

## 2015-06-23 NOTE — Patient Instructions (Signed)
Your knee pain is due to arthritis - you also have some compensatory low back pain but it's not from hip arthritis. These are the 4 classes of medicine you can take for this: Tylenol 500mg  1-2 tabs three times a day for pain. Aleve 1-2 tabs twice a day with food Glucosamine sulfate 750mg  twice a day is a supplement that may help. Capsaicin, aspercreme, or biofreeze topically up to four times a day may also help with pain. Cortisone injections are an option - you were given this today. If cortisone injections do not help, there are different types of shots that may help but they take longer to take effect. It's important that you continue to stay active. Straight leg raises, knee extensions 3 sets of 10 once a day (add ankle weight if these become too easy). Consider physical therapy to strengthen muscles around the joint that hurts to take pressure off of the joint itself. Shoe inserts with good arch support may be helpful. Walker or cane if needed. Heat or ice 15 minutes at a time 3-4 times a day as needed to help with pain. Water aerobics and cycling with low resistance are the best two types of exercise for arthritis. If not improving I would recommend imaging (x-rays first) - call if over the next week or two if you're not improving. Follow up with me in 5-6 weeks otherwise.

## 2015-06-27 DIAGNOSIS — M170 Bilateral primary osteoarthritis of knee: Secondary | ICD-10-CM | POA: Insufficient documentation

## 2015-06-27 NOTE — Assessment & Plan Note (Signed)
2/2 arthritis flare.  Discussed tylenol, nsaids, glucosamine, topical medications.  Given intraarticular injection into worse left knee.  Shown home exercises to do daily.  Heat/ice if needed.  Consider imaging if not improving over next 1-2 weeks.  F/u in 5-6 weeks otherwise.  After informed written consent, patient was seated on exam table. Left knee was prepped with alcohol swab and utilizing anteromedial approach, patient's left knee was injected intraarticularly with 3:1 marcaine: depomedrol. Patient tolerated the procedure well without immediate complications.

## 2015-06-27 NOTE — Progress Notes (Signed)
PCP: No PCP Per Patient  Subjective:   HPI: Patient is a 45 y.o. female here for left hip pain, bilateral knee pain.  Patient reports she's had several months of bilateral knee pain. Pain is anterior, left worse than right and mostly lateral. Pain level 5/10, sharp. No swelling, bruising. No known injury. Previously tried naproxen, tylenol, ibuprofen. No skin changes, numbness.  Past Medical History  Diagnosis Date  . Arthritis     Current Outpatient Prescriptions on File Prior to Visit  Medication Sig Dispense Refill  . ibuprofen (ADVIL,MOTRIN) 600 MG tablet Take 1 tablet (600 mg total) by mouth every 6 (six) hours as needed. 30 tablet 0  . HYDROcodone-acetaminophen (NORCO) 5-325 MG per tablet Take 1 tablet by mouth every 6 (six) hours as needed for moderate pain. (Patient not taking: Reported on 06/16/2015) 40 tablet 0   No current facility-administered medications on file prior to visit.    Past Surgical History  Procedure Laterality Date  . Tubal ligation    . Cholecystectomy      No Known Allergies  Social History   Social History  . Marital Status: Single    Spouse Name: N/A  . Number of Children: N/A  . Years of Education: N/A   Occupational History  . Not on file.   Social History Main Topics  . Smoking status: Current Every Day Smoker -- 0.00 packs/day    Types: Cigarettes  . Smokeless tobacco: Not on file  . Alcohol Use: No  . Drug Use: No  . Sexual Activity: Not on file   Other Topics Concern  . Not on file   Social History Narrative    No family history on file.  BP 129/83 mmHg  Pulse 89  Ht 5\' 4"  (1.626 m)  Wt 262 lb (118.842 kg)  BMI 44.95 kg/m2  LMP 05/27/2015  Review of Systems: See HPI above.    Objective:  Physical Exam:  Gen: NAD, comfortable in exam room  Bilateral knees: No gross deformity, ecchymoses, effusion. TTP lateral joint lines > medial.  No other tenderrness. FROM. Negative ant/post drawers. Negative  valgus/varus testing. Negative lachmanns. Negative mcmurrays, apleys, patellar apprehension. NV intact distally.  Back/left hip: No gross deformity, scoliosis. TTP mildly left lumbar paraspinal region.  No midline or bony TTP. FROM. Strength LEs 5/5 all muscle groups.   Negative SLRs. Sensation intact to light touch bilaterally. Negative logroll bilateral hips Negative fabers and piriformis stretches.    Assessment & Plan:  1. Bilateral knee pain - 2/2 arthritis flare.  Discussed tylenol, nsaids, glucosamine, topical medications.  Given intraarticular injection into worse left knee.  Shown home exercises to do daily.  Heat/ice if needed.  Consider imaging if not improving over next 1-2 weeks.  F/u in 5-6 weeks otherwise.  After informed written consent, patient was seated on exam table. Left knee was prepped with alcohol swab and utilizing anteromedial approach, patient's left knee was injected intraarticularly with 3:1 marcaine: depomedrol. Patient tolerated the procedure well without immediate complications.

## 2015-07-28 ENCOUNTER — Ambulatory Visit (HOSPITAL_BASED_OUTPATIENT_CLINIC_OR_DEPARTMENT_OTHER)
Admission: RE | Admit: 2015-07-28 | Discharge: 2015-07-28 | Disposition: A | Discharge status: Home / Self Care | Payer: No Typology Code available for payment source | Source: Ambulatory Visit | Attending: Family Medicine | Admitting: Family Medicine

## 2015-07-28 ENCOUNTER — Ambulatory Visit (INDEPENDENT_AMBULATORY_CARE_PROVIDER_SITE_OTHER): Payer: Self-pay | Admitting: Family Medicine

## 2015-07-28 VITALS — BP 120/75 | HR 83 | Ht 64.0 in | Wt 267.0 lb

## 2015-07-28 DIAGNOSIS — M25562 Pain in left knee: Secondary | ICD-10-CM | POA: Insufficient documentation

## 2015-07-28 DIAGNOSIS — M1712 Unilateral primary osteoarthritis, left knee: Secondary | ICD-10-CM | POA: Insufficient documentation

## 2015-07-28 DIAGNOSIS — M25561 Pain in right knee: Secondary | ICD-10-CM

## 2015-07-28 IMAGING — DX DG KNEE AP/LAT W/ SUNRISE*L*
3 series · 3 of 3 positions shown · non-contrast
Comparison: None.

CLINICAL DATA: Left anterior knee pain for 5 years.  Denies injury.

EXAM:
LEFT KNEE 3 VIEWS

[knee ap]
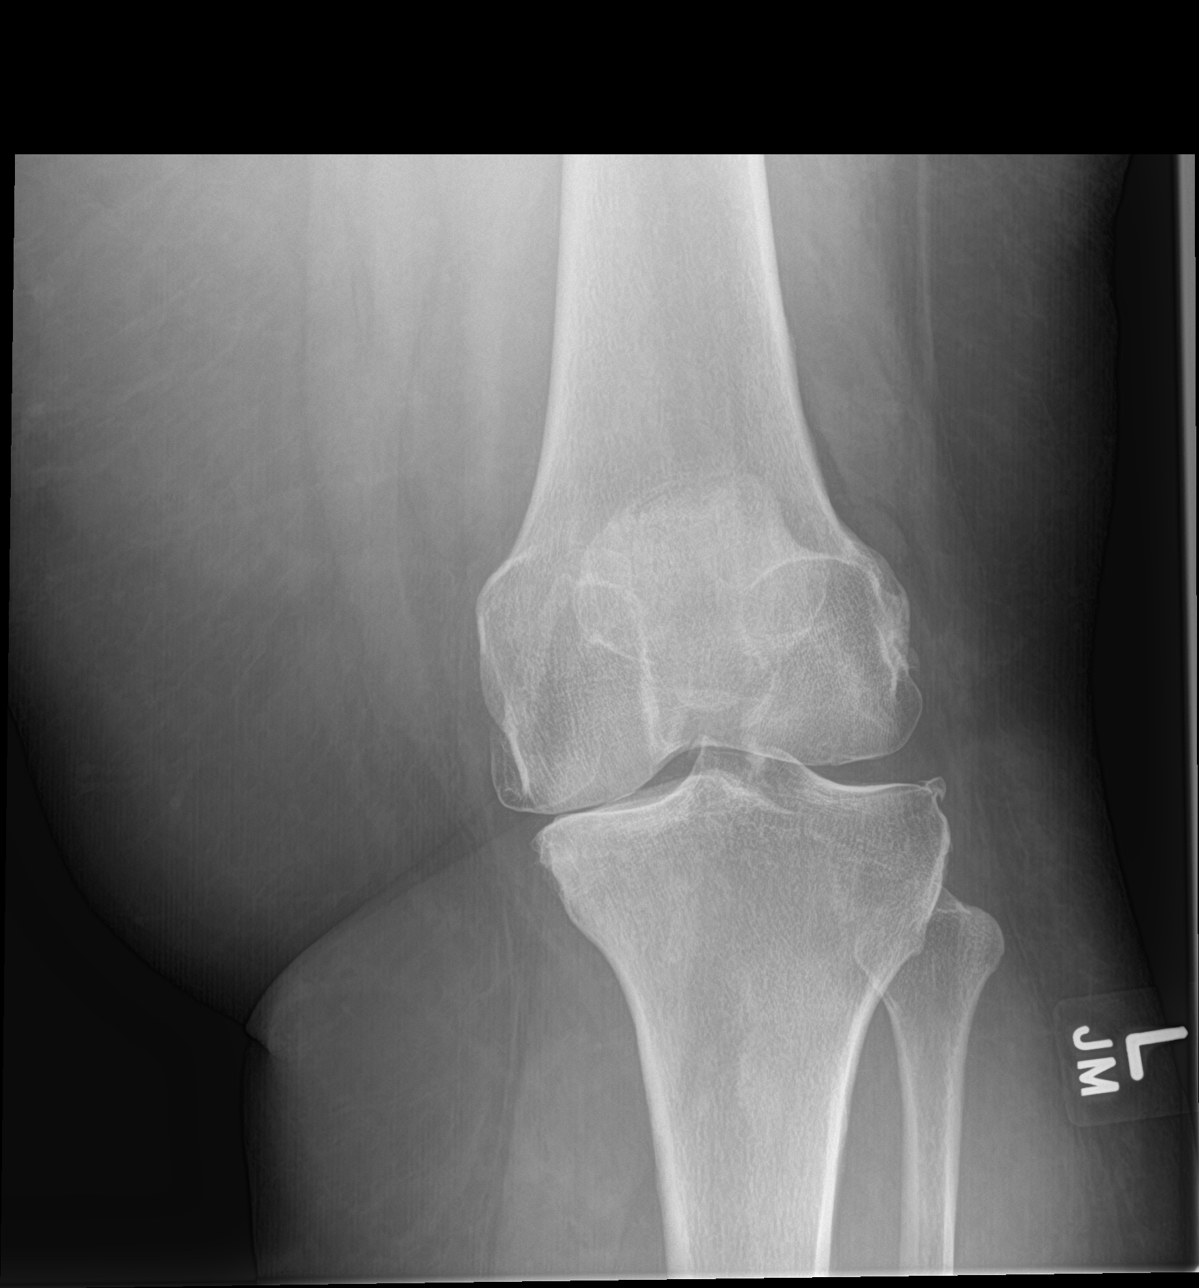

[knee lat]
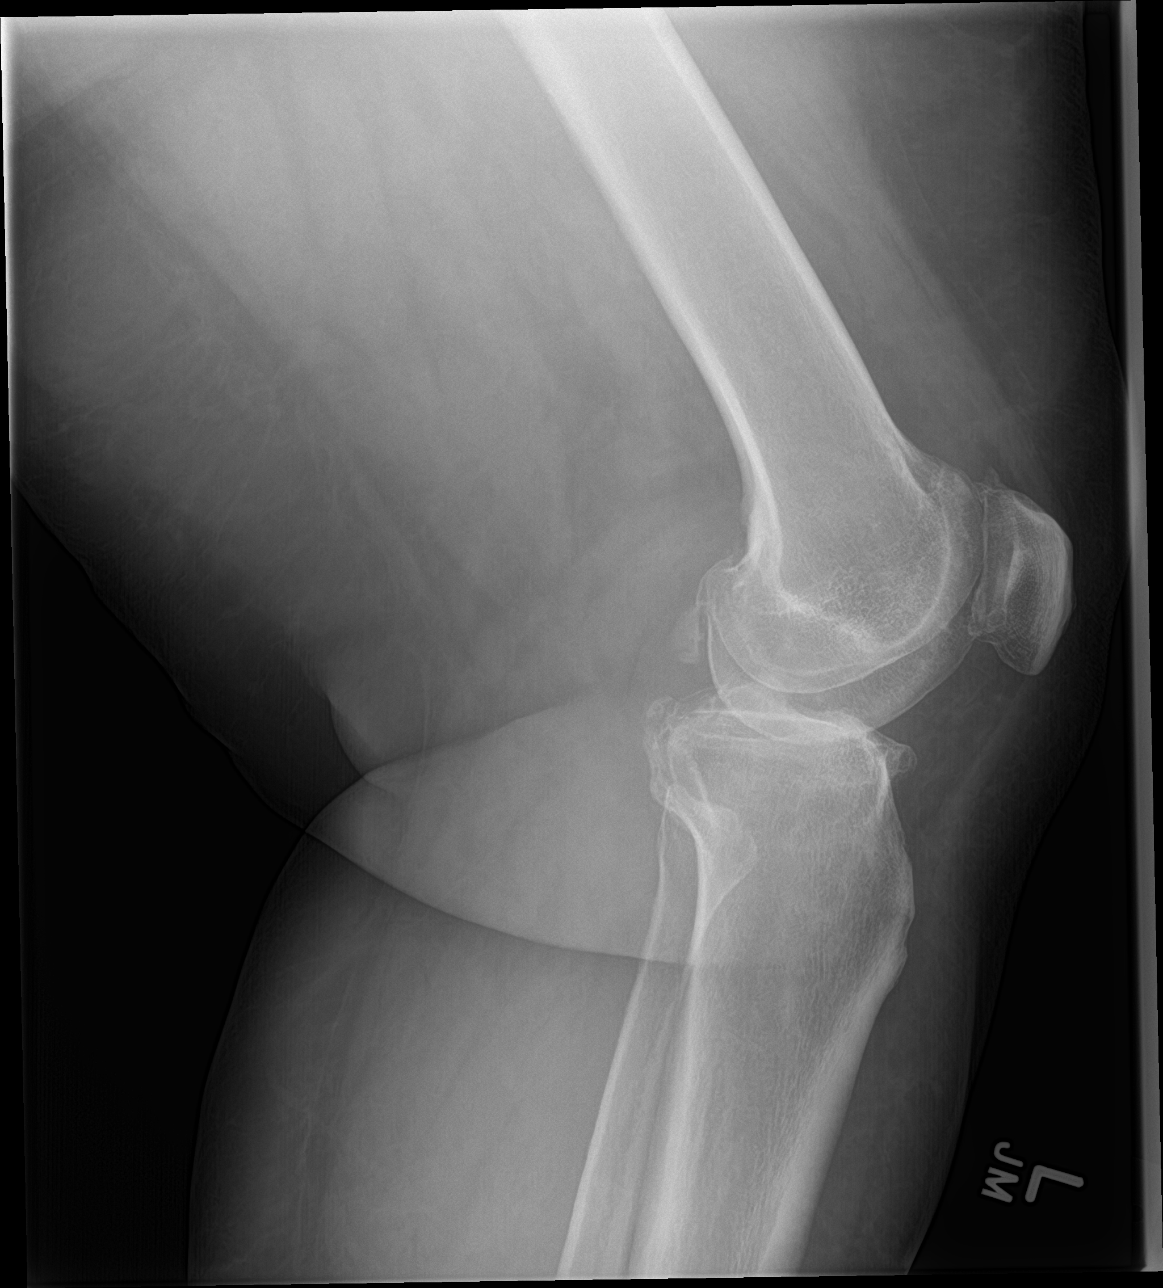

[knee sunrise]
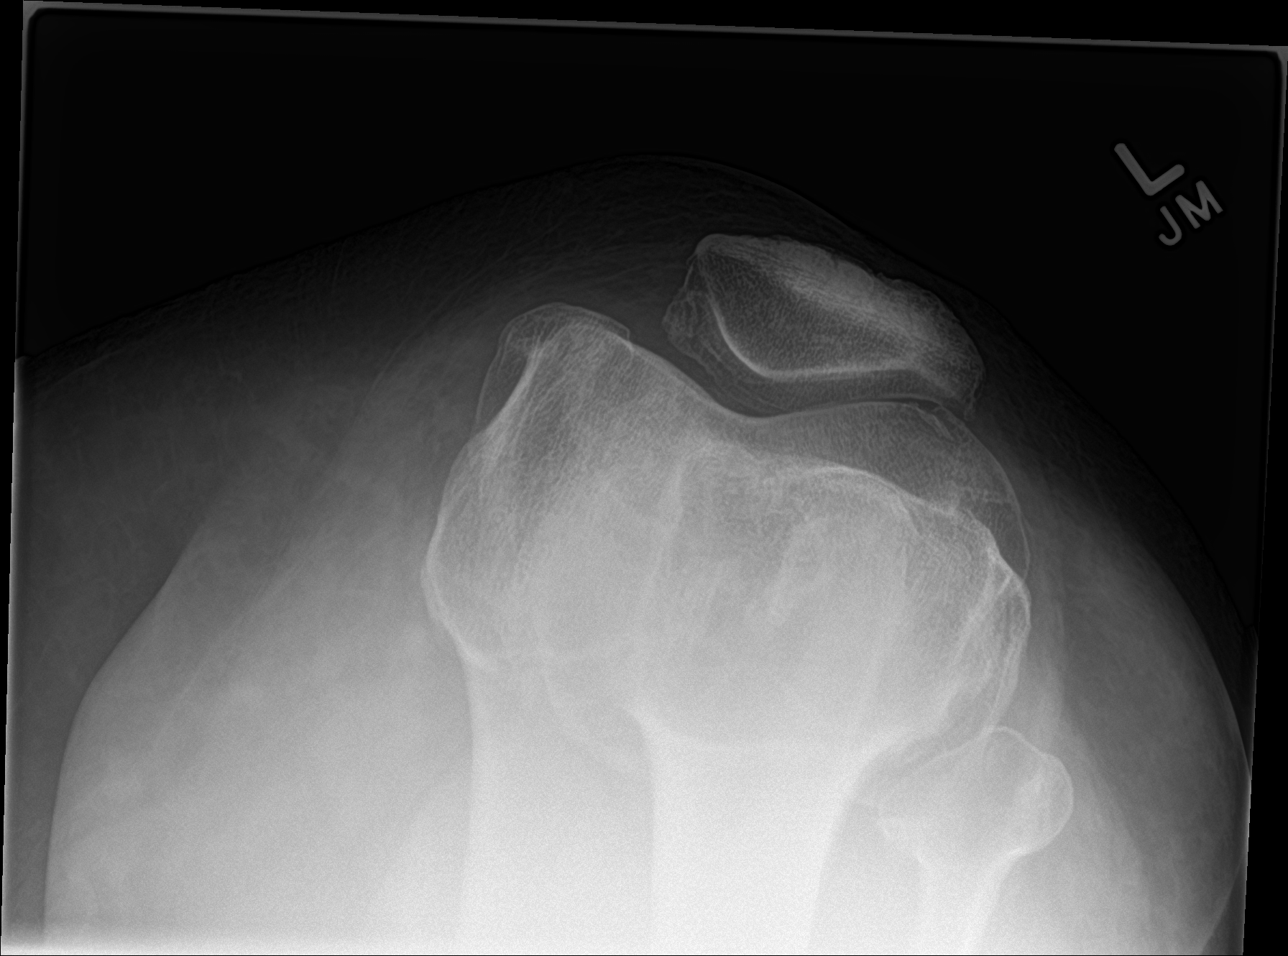

[3 of 3 positions shown; findings below may reference images not displayed]

FINDINGS: No evidence of fracture, dislocation, or joint effusion. There is 3
compartment moderate to severe osteoarthritis with joint space
narrowing, subchondral sclerosis, mild chondrocalcinosis and
moderate in size osteophyte formation. The process is particularly
severe in the medial compartment. Soft tissues are unremarkable.
IMPRESSION: Three compartment osteoarthritis of the left knee, moderate in
severity.

## 2015-07-28 NOTE — Patient Instructions (Signed)
Get x-rays of your left knee downstairs as you leave today.  We will contact you with the results. Next step would be either physical therapy or MRI of this knee.

## 2015-08-01 ENCOUNTER — Encounter: Payer: Self-pay | Admitting: Family Medicine

## 2015-08-01 NOTE — Progress Notes (Signed)
PCP: No PCP Per Patient  Subjective:   HPI: Patient is a 45 y.o. female here for left hip pain, bilateral knee pain.  4/13: Patient reports she's had several months of bilateral knee pain. Pain is anterior, left worse than right and mostly lateral. Pain level 5/10, sharp. No swelling, bruising. No known injury. Previously tried naproxen, tylenol, ibuprofen. No skin changes, numbness.  5/18: Patient reports her hip has improved since last visit but left knee is fairly severe. Pain level 7/10, sharp, anterior. Not really noticed a difference with cortisone injection last visit. Worse with ambulation. Pain also has a burning quality to it. Better with rest. No skin changes, numbness.  Past Medical History  Diagnosis Date  . Arthritis     Current Outpatient Prescriptions on File Prior to Visit  Medication Sig Dispense Refill  . HYDROcodone-acetaminophen (NORCO) 5-325 MG per tablet Take 1 tablet by mouth every 6 (six) hours as needed for moderate pain. (Patient not taking: Reported on 06/16/2015) 40 tablet 0  . ibuprofen (ADVIL,MOTRIN) 600 MG tablet Take 1 tablet (600 mg total) by mouth every 6 (six) hours as needed. 30 tablet 0   No current facility-administered medications on file prior to visit.    Past Surgical History  Procedure Laterality Date  . Tubal ligation    . Cholecystectomy      No Known Allergies  Social History   Social History  . Marital Status: Single    Spouse Name: N/A  . Number of Children: N/A  . Years of Education: N/A   Occupational History  . Not on file.   Social History Main Topics  . Smoking status: Current Every Day Smoker -- 0.00 packs/day    Types: Cigarettes  . Smokeless tobacco: Not on file  . Alcohol Use: No  . Drug Use: No  . Sexual Activity: Not on file   Other Topics Concern  . Not on file   Social History Narrative    No family history on file.  BP 120/75 mmHg  Pulse 83  Ht 5\' 4"  (1.626 m)  Wt 267 lb (121.11  kg)  BMI 45.81 kg/m2  Review of Systems: See HPI above.    Objective:  Physical Exam:  Gen: NAD, comfortable in exam room  Left knee: No gross deformity, ecchymoses, effusion. TTP lateral joint line > medial.  No other tenderrness. FROM. Negative ant/post drawers. Negative valgus/varus testing. Negative lachmanns. Negative mcmurrays, apleys, patellar apprehension. NV intact distally.    Assessment & Plan:  1. Left knee pain - Independently reviewed radiographs - she has moderate to severe tricompartmental arthritis unfortunately, advanced for her age.  Would expect the cortisone shot to have helped her but it did not.  We discussed options - repeat injection, viscosupplementation, physical therapy, MRI.  Tylenol, nsaids, glucosamine, topical medications.  She would like to go ahead with MRI to assess for concurrent meniscal tear.  Concern given degree of arthritis that an arthroscopy may not provide long-lasting benefit though.

## 2015-08-01 NOTE — Assessment & Plan Note (Signed)
Independently reviewed radiographs - she has moderate to severe tricompartmental arthritis unfortunately, advanced for her age.  Would expect the cortisone shot to have helped her but it did not.  We discussed options - repeat injection, viscosupplementation, physical therapy, MRI.  Tylenol, nsaids, glucosamine, topical medications.  She would like to go ahead with MRI to assess for concurrent meniscal tear.  Concern given degree of arthritis that an arthroscopy may not provide long-lasting benefit though.

## 2016-03-01 ENCOUNTER — Ambulatory Visit: Payer: Self-pay | Admitting: Family Medicine

## 2016-03-02 ENCOUNTER — Encounter: Payer: Self-pay | Admitting: Family Medicine

## 2016-03-02 ENCOUNTER — Ambulatory Visit (INDEPENDENT_AMBULATORY_CARE_PROVIDER_SITE_OTHER): Payer: Self-pay | Admitting: Family Medicine

## 2016-03-02 DIAGNOSIS — M25562 Pain in left knee: Secondary | ICD-10-CM

## 2016-03-02 DIAGNOSIS — G8929 Other chronic pain: Secondary | ICD-10-CM

## 2016-03-02 DIAGNOSIS — M25561 Pain in right knee: Secondary | ICD-10-CM

## 2016-03-02 NOTE — Patient Instructions (Signed)
Your knee pain is due to arthritis. These are the different medicines you can take for this: Tylenol 500mg  1-2 tabs three times a day for pain. Aleve 1-2 tabs twice a day with food Glucosamine sulfate 750mg  twice a day is a supplement that may help. Capsaicin, aspercreme, or biofreeze topically up to four times a day may also help with pain. Cortisone injections are an option but these did not help you. Call us when the [employer] coverage goes through. I would go ahead with gel shots at this point. It's important that you continue to stay active. Straight leg raises, knee extensions 3 sets of 10 once a day (add ankle weight if these become too easy). Consider physical therapy to strengthen muscles around the joint that hurts to take pressure off of the joint itself. Shoe inserts with good arch support may be helpful. Walker or cane if needed. Heat or ice 15 minutes at a time 3-4 times a day as needed to help with pain. Water aerobics and cycling with low resistance are the best two types of exercise for arthritis.

## 2016-03-08 ENCOUNTER — Ambulatory Visit: Payer: No Typology Code available for payment source

## 2016-03-10 NOTE — Assessment & Plan Note (Signed)
Radiographs previously showed moderate-severe tricompartmental arthritis, advanced for her age.  She did not improve with cortisone injection.  Will get [employer] coverage and try to get approval for viscosupplementation.  Tylenol, nsaids, glucosamine, topical medications.

## 2016-03-10 NOTE — Progress Notes (Signed)
PCP: No PCP Per Patient  Subjective:   HPI: Patient is a 45 y.o. female here for left knee pain.  4/13: Patient reports she's had several months of bilateral knee pain. Pain is anterior, left worse than right and mostly lateral. Pain level 5/10, sharp. No swelling, bruising. No known injury. Previously tried naproxen, tylenol, ibuprofen. No skin changes, numbness.  5/18: Patient reports her hip has improved since last visit but left knee is fairly severe. Pain level 7/10, sharp, anterior. Not really noticed a difference with cortisone injection last visit. Worse with ambulation. Pain also has a burning quality to it. Better with rest. No skin changes, numbness.  12/22: Patient reports she continues to have anterior left knee pain at 5/10 level, sharp. Worse with prolonged standing, bending, squatting, lifting. Taking aleve as needed. Some swelling, tightness. 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 No skin changes, numbness.  Past Medical History:  Diagnosis Date  . Arthritis     Current Outpatient Prescriptions on File Prior to Visit  Medication Sig Dispense Refill  . HYDROcodone-acetaminophen (NORCO) 5-325 MG per tablet Take 1 tablet by mouth every 6 (six) hours as needed for moderate pain. (Patient not taking: Reported on 06/16/2015) 40 tablet 0  . ibuprofen (ADVIL,MOTRIN) 600 MG tablet Take 1 tablet (600 mg total) by mouth every 6 (six) hours as needed. 30 tablet 0   No current facility-administered medications on file prior to visit.     Past Surgical History:  Procedure Laterality Date  . CHOLECYSTECTOMY    . TUBAL LIGATION      No Known Allergies  Social History   Social History  . Marital status: Single    Spouse name: N/A  . Number of children: N/A  . Years of education: N/A   Occupational History  . Not on file.   Social History Main Topics  . Smoking status: Current Every Day Smoker    Packs/day: 0.00    Types: Cigarettes  . Smokeless  tobacco: Never Used  . Alcohol use No  . Drug use: No  . Sexual activity: Not on file   Other Topics Concern  . Not on file   Social History Narrative  . No narrative on file    No family history on file.  BP 111/75   Pulse 78   Ht 5\' 6"  (1.676 m)   Wt 262 lb 4.8 oz (119 kg)   BMI 42.34 kg/m   Review of Systems: See HPI above.    Objective:  Physical Exam:  Gen: NAD, comfortable in exam room  Left knee: No gross deformity, ecchymoses, effusion. TTP medial and lateral joint lines.  No other tenderrness. FROM. Negative ant/post drawers. Negative valgus/varus testing. Negative lachmanns. Negative mcmurrays, apleys, patellar apprehension. NV intact distally.    Assessment & Plan:  1. Left knee pain - Radiographs previously showed moderate-severe tricompartmental arthritis, advanced for her age.  She did not improve with cortisone injection.  Will get cone coverage and try to get approval for viscosupplementation.  Tylenol, nsaids, glucosamine, topical medications.

## 2016-03-15 ENCOUNTER — Ambulatory Visit: Payer: No Typology Code available for payment source

## 2016-03-20 ENCOUNTER — Ambulatory Visit: Payer: No Typology Code available for payment source

## 2016-04-12 ENCOUNTER — Telehealth: Payer: Self-pay | Admitting: Family Medicine

## 2016-04-16 NOTE — Telephone Encounter (Signed)
Spoke to patient and told her that I have filled out a form and faxed form for the gel shots. The company now has a different form which they faxed to me and I will fill out and re-send. Told patient would contact her once I hear back from the company.

## 2016-04-16 NOTE — Telephone Encounter (Signed)
Jenna [name] - have you checked into this and if it's possible with [employer]?

## 2016-05-08 ENCOUNTER — Encounter: Payer: Self-pay | Admitting: Family Medicine

## 2016-05-08 ENCOUNTER — Ambulatory Visit (INDEPENDENT_AMBULATORY_CARE_PROVIDER_SITE_OTHER): Payer: Self-pay | Admitting: Family Medicine

## 2016-05-08 VITALS — BP 120/82 | HR 94 | Ht 64.0 in

## 2016-05-08 DIAGNOSIS — M1712 Unilateral primary osteoarthritis, left knee: Secondary | ICD-10-CM

## 2016-05-08 MED ORDER — SODIUM HYALURONATE (VISCOSUP) 25 MG/2.5ML IX SOSY
2.5000 mL | PREFILLED_SYRINGE | Freq: Once | INTRA_ARTICULAR | Status: AC
  Administered 2016-05-08: 2.5 mL via INTRA_ARTICULAR

## 2016-05-08 NOTE — Progress Notes (Signed)
PCP: No PCP Per Patient  Subjective:   HPI: Patient is a 46 y.o. female here for left knee pain.  4/13: Patient reports she's had several months of bilateral knee pain. Pain is anterior, left worse than right and mostly lateral. Pain level 5/10, sharp. No swelling, bruising. No known injury. Previously tried naproxen, tylenol, ibuprofen. No skin changes, numbness.  5/18: Patient reports her hip has improved since last visit but left knee is fairly severe. Pain level 7/10, sharp, anterior. Not really noticed a difference with cortisone injection last visit. Worse with ambulation. Pain also has a burning quality to it. Better with rest. No skin changes, numbness.  03/02/16: Patient reports she continues to have anterior left knee pain at 5/10 level, sharp. Worse with prolonged standing, bending, squatting, lifting. Taking aleve as needed. Some swelling, tightness. 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 No skin changes, numbness.  05/08/16: Patient returns today to start supartz series in left knee. Pain level 5/10, sharp, anterior. No skin changes, numbness, swelling.  Past Medical History:  Diagnosis Date  . Arthritis     No current outpatient prescriptions on file prior to visit.   No current facility-administered medications on file prior to visit.     Past Surgical History:  Procedure Laterality Date  . CHOLECYSTECTOMY    . TUBAL LIGATION      No Known Allergies  Social History   Social History  . Marital status: Single    Spouse name: N/A  . Number of children: N/A  . Years of education: N/A   Occupational History  . Not on file.   Social History Main Topics  . Smoking status: Current Every Day Smoker    Packs/day: 0.00    Types: Cigarettes  . Smokeless tobacco: Never Used  . Alcohol use No  . Drug use: No  . Sexual activity: Not on file   Other Topics Concern  . Not on file   Social History Narrative  . No narrative on file     No family history on file.  BP 120/82   Pulse 94   Ht 5\' 4"  (1.626 m)   Review of Systems: See HPI above.    Objective:  Physical Exam:  Gen: NAD, comfortable in exam room  Exam not repeated today. Left knee: No gross deformity, ecchymoses, effusion. TTP medial and lateral joint lines.  No other tenderrness. FROM. Negative ant/post drawers. Negative valgus/varus testing. Negative lachmanns. Negative mcmurrays, apleys, patellar apprehension. NV intact distally.    Assessment & Plan:  1. Left knee pain - Radiographs previously showed moderate-severe tricompartmental arthritis, advanced for her age.  She did not improve with cortisone injection. Started supartz series today.  Tylenol, nsaids, glucosamine, topical medications, home exercises.  After informed written consent, patient was seated on exam table. Left knee was prepped with alcohol swab and utilizing anteromedial approach, patient's left knee was injected intraarticularly with 3mL bupivicaine followed by supartz. Patient tolerated the procedure well without immediate complications.

## 2016-05-08 NOTE — Assessment & Plan Note (Signed)
Radiographs previously showed moderate-severe tricompartmental arthritis, advanced for her age.  She did not improve with cortisone injection. Started supartz series today.  Tylenol, nsaids, glucosamine, topical medications, home exercises.  After informed written consent, patient was seated on exam table. Left knee was prepped with alcohol swab and utilizing anteromedial approach, patient's left knee was injected intraarticularly with 3mL bupivicaine followed by supartz. Patient tolerated the procedure well without immediate complications.

## 2016-05-15 ENCOUNTER — Ambulatory Visit (INDEPENDENT_AMBULATORY_CARE_PROVIDER_SITE_OTHER): Payer: No Typology Code available for payment source | Admitting: Family Medicine

## 2016-05-15 ENCOUNTER — Ambulatory Visit: Payer: No Typology Code available for payment source | Admitting: Family Medicine

## 2016-05-15 ENCOUNTER — Encounter: Payer: Self-pay | Admitting: Family Medicine

## 2016-05-15 VITALS — BP 140/90 | HR 94 | Ht 65.0 in | Wt 260.0 lb

## 2016-05-15 DIAGNOSIS — M545 Low back pain: Secondary | ICD-10-CM

## 2016-05-15 DIAGNOSIS — G8929 Other chronic pain: Secondary | ICD-10-CM

## 2016-05-15 DIAGNOSIS — M1712 Unilateral primary osteoarthritis, left knee: Secondary | ICD-10-CM

## 2016-05-15 MED ORDER — SODIUM HYALURONATE (VISCOSUP) 25 MG/2.5ML IX SOSY
2.5000 mL | PREFILLED_SYRINGE | Freq: Once | INTRA_ARTICULAR | Status: AC
  Administered 2016-05-15: 2.5 mL via INTRA_ARTICULAR

## 2016-05-15 NOTE — Patient Instructions (Signed)
You have arthritis and lumbar strain of your back. Take tylenol for baseline pain relief (1-2 extra strength tabs 3x/day) Voltaren twice a day with food for pain and inflammation (if you do not have stomach or kidney issues). Robaxin as needed for muscle spasms. Call me if you need refills on either of these medicines. Stay as active as possible. Do home exercises and stretches as directed - hold each for 20-30 seconds and do each one three times. Start physical therapy. Strengthening of low back muscles, abdominal musculature are key for long term pain relief. If not improving, will consider further imaging (MRI). Follow up with me in 5-6 weeks for your back but in 1 week for the final knee injection.

## 2016-05-16 NOTE — Progress Notes (Signed)
PCP: No PCP Per Patient  Subjective:   HPI: Patient is a 46 y.o. female here for left knee pain, back pain.  4/13: Patient reports she's had several months of bilateral knee pain. Pain is anterior, left worse than right and mostly lateral. Pain level 5/10, sharp. No swelling, bruising. No known injury. Previously tried naproxen, tylenol, ibuprofen. No skin changes, numbness.  5/18: Patient reports her hip has improved since last visit but left knee is fairly severe. Pain level 7/10, sharp, anterior. Not really noticed a difference with cortisone injection last visit. Worse with ambulation. Pain also has a burning quality to it. Better with rest. No skin changes, numbness.  03/02/16: Patient reports she continues to have anterior left knee pain at 5/10 level, sharp. Worse with prolonged standing, bending, squatting, lifting. Taking aleve as needed. Some swelling, tightness. 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 No skin changes, numbness.  05/08/16: Patient returns today to start supartz series in left knee. Pain level 5/10, sharp, anterior. No skin changes, numbness, swelling.  3/6: Patient returns for second supartz for left knee but also for low back pain. Has had low back pain since 2 months ago. No acute injury or trauma. Has had problems with back in the past. Pain central lumbar region and radiates laterally bilaterally. Worse with prolonged sitting or standing. No radiation into legs. Pain level 7/10, sharp. No bowel/bladder dysfunction.  Past Medical History:  Diagnosis Date  . Arthritis     Current Outpatient Prescriptions on File Prior to Visit  Medication Sig Dispense Refill  . diclofenac (VOLTAREN) 75 MG EC tablet Take 75 mg by mouth.    . methocarbamol (ROBAXIN) 500 MG tablet Take 500-1,000 mg by mouth.     No current facility-administered medications on file prior to visit.     Past Surgical History:  Procedure Laterality Date  .  CHOLECYSTECTOMY    . TUBAL LIGATION      No Known Allergies  Social History   Social History  . Marital status: Single    Spouse name: N/A  . Number of children: N/A  . Years of education: N/A   Occupational History  . Not on file.   Social History Main Topics  . Smoking status: Current Every Day Smoker    Packs/day: 0.00    Types: Cigarettes  . Smokeless tobacco: Never Used  . Alcohol use No  . Drug use: No  . Sexual activity: Not on file   Other Topics Concern  . Not on file   Social History Narrative  . No narrative on file    No family history on file.  BP 140/90   Pulse 94   Ht  (1.651 m)   Wt 260 lb (117.9 kg)   BMI 43.27 kg/m   Review of Systems: See HPI above.    Objective:  Physical Exam:  Gen: NAD, comfortable in exam room  Back: No gross deformity, scoliosis. TTP midline but no focal bony tenderness. FROM with pain on flexion and extension. Strength LEs 5/5 all muscle groups.   2+ MSRs in achilles and 1+ patellar tendons, equal bilaterally. Negative SLRs. Sensation intact to light touch bilaterally. Negative logroll bilateral hips Negative fabers and piriformis stretches.  Knee exam not repeated today. Left knee: No gross deformity, ecchymoses, effusion. TTP medial and lateral joint lines.  No other tenderrness. FROM. Negative ant/post drawers. Negative valgus/varus testing. Negative lachmanns. Negative mcmurrays, apleys, patellar apprehension. NV intact distally.    Assessment &  Plan:  1. Low back pain - Radiographs showed narrowing at L4-5 and L5-S1 with facet arthropathy here as well.  She will start physical therapy, home exercises.  Voltaren with robaxin, tylenol if needed.  F/u in 5-6 weeks for this issue.  2. Left knee pain - Radiographs previously showed moderate-severe tricompartmental arthritis, advanced for her age.  She did not improve with cortisone injection. Second supartz injection given today.  Tylenol, nsaids,  glucosamine, topical medications, home exercises.  After informed written consent, patient was seated on exam table. Left knee was prepped with alcohol swab and utilizing anteromedial approach, patient's left knee was injected intraarticularly with 3mL bupivicaine followed by supartz. Patient tolerated the procedure well without immediate complications.

## 2016-05-17 DIAGNOSIS — M545 Low back pain, unspecified: Secondary | ICD-10-CM | POA: Insufficient documentation

## 2016-05-17 NOTE — Assessment & Plan Note (Signed)
Radiographs previously showed moderate-severe tricompartmental arthritis, advanced for her age.  She did not improve with cortisone injection. Second supartz injection given today.  Tylenol, nsaids, glucosamine, topical medications, home exercises.  After informed written consent, patient was seated on exam table. Left knee was prepped with alcohol swab and utilizing anteromedial approach, patient's left knee was injected intraarticularly with 3mL bupivicaine followed by supartz. Patient tolerated the procedure well without immediate complications.

## 2016-05-17 NOTE — Assessment & Plan Note (Signed)
Radiographs showed narrowing at L4-5 and L5-S1 with facet arthropathy here as well.  She will start physical therapy, home exercises.  Voltaren with robaxin, tylenol if needed.  F/u in 5-6 weeks for this issue.

## 2016-05-22 ENCOUNTER — Ambulatory Visit: Payer: No Typology Code available for payment source | Admitting: Family Medicine

## 2016-05-24 ENCOUNTER — Ambulatory Visit: Payer: No Typology Code available for payment source | Admitting: Family Medicine

## 2016-05-25 ENCOUNTER — Ambulatory Visit (INDEPENDENT_AMBULATORY_CARE_PROVIDER_SITE_OTHER): Payer: No Typology Code available for payment source | Admitting: Family Medicine

## 2016-05-25 ENCOUNTER — Encounter: Payer: Self-pay | Admitting: Family Medicine

## 2016-05-25 VITALS — BP 132/88 | HR 81 | Ht 65.0 in | Wt 258.0 lb

## 2016-05-25 DIAGNOSIS — M1712 Unilateral primary osteoarthritis, left knee: Secondary | ICD-10-CM

## 2016-05-25 MED ORDER — SODIUM HYALURONATE (VISCOSUP) 25 MG/2.5ML IX SOSY
2.5000 mL | PREFILLED_SYRINGE | Freq: Once | INTRA_ARTICULAR | Status: AC
  Administered 2016-05-25: 2.5 mL via INTRA_ARTICULAR

## 2016-05-25 NOTE — Progress Notes (Signed)
PCP: No PCP Per Patient  Subjective:   HPI: Patient is a 46 y.o. female here for left knee pain, back pain.  4/13: Patient reports she's had several months of bilateral knee pain. Pain is anterior, left worse than right and mostly lateral. Pain level 5/10, sharp. No swelling, bruising. No known injury. Previously tried naproxen, tylenol, ibuprofen. No skin changes, numbness.  5/18: Patient reports her hip has improved since last visit but left knee is fairly severe. Pain level 7/10, sharp, anterior. Not really noticed a difference with cortisone injection last visit. Worse with ambulation. Pain also has a burning quality to it. Better with rest. No skin changes, numbness.  03/02/16: Patient reports she continues to have anterior left knee pain at 5/10 level, sharp. Worse with prolonged standing, bending, squatting, lifting. Taking aleve as needed. Some swelling, tightness. 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 No skin changes, numbness.  05/08/16: Patient returns today to start supartz series in left knee. Pain level 5/10, sharp, anterior. No skin changes, numbness, swelling.  3/6: Patient returns for second supartz for left knee but also for low back pain. Has had low back pain since 2 months ago. No acute injury or trauma. Has had problems with back in the past. Pain central lumbar region and radiates laterally bilaterally. Worse with prolonged sitting or standing. No radiation into legs. Pain level 7/10, sharp. No bowel/bladder dysfunction.  3/16: Patient returns for third supartz. She reports pain has improved since last visit. Feels a cramp in thigh area at times. No skin changes, numbness.  Past Medical History:  Diagnosis Date  . Arthritis     Current Outpatient Prescriptions on File Prior to Visit  Medication Sig Dispense Refill  . diclofenac (VOLTAREN) 75 MG EC tablet Take 75 mg by mouth.    . methocarbamol (ROBAXIN) 500 MG tablet Take  500-1,000 mg by mouth.     No current facility-administered medications on file prior to visit.     Past Surgical History:  Procedure Laterality Date  . CHOLECYSTECTOMY    . TUBAL LIGATION      No Known Allergies  Social History   Social History  . Marital status: Single    Spouse name: N/A  . Number of children: N/A  . Years of education: N/A   Occupational History  . Not on file.   Social History Main Topics  . Smoking status: Current Every Day Smoker    Packs/day: 0.00    Types: Cigarettes  . Smokeless tobacco: Never Used  . Alcohol use No  . Drug use: No  . Sexual activity: Not on file   Other Topics Concern  . Not on file   Social History Narrative  . No narrative on file    No family history on file.  BP 132/88   Pulse 81   Ht 5\' 5"  (1.651 m)   Wt 258 lb (117 kg)   BMI 42.93 kg/m   Review of Systems: See HPI above.    Objective:  Physical Exam:  Gen: NAD, comfortable in exam room  Knee exam not repeated today. Left knee: No gross deformity, ecchymoses, effusion. TTP medial and lateral joint lines.  No other tenderrness. FROM. Negative ant/post drawers. Negative valgus/varus testing. Negative lachmanns. Negative mcmurrays, apleys, patellar apprehension. NV intact distally.    Assessment & Plan:  1. Left knee pain - Radiographs previously showed moderate-severe tricompartmental arthritis, advanced for her age.  She did not improve with cortisone injection. Third supartz injection  given today.  Tylenol, nsaids, glucosamine, topical medications, home exercises.  Call us in 4 weeks to let us know how she's doing.  After informed written consent, patient was seated on exam table. Left knee was prepped with alcohol swab and utilizing anterolateral approach, patient's left knee was injected intraarticularly with 3mL bupivicaine followed by supartz. Patient tolerated the procedure well without immediate complications.

## 2016-06-22 NOTE — Addendum Note (Signed)
Addended by: Kathi Simpers F on: 06/22/2016 12:46 PM   Modules accepted: Orders

## 2016-07-02 ENCOUNTER — Ambulatory Visit (INDEPENDENT_AMBULATORY_CARE_PROVIDER_SITE_OTHER): Payer: Self-pay | Admitting: Orthopaedic Surgery

## 2016-07-02 DIAGNOSIS — M1712 Unilateral primary osteoarthritis, left knee: Secondary | ICD-10-CM

## 2016-07-02 NOTE — Progress Notes (Signed)
Office Visit Note   Patient: Jenna [name]           Date of Birth: August 29, 1970           MRN: [id number] Visit Date: 07/02/2016              Requested by: No referring provider defined for this encounter. PCP: No PCP Per Patient   Assessment & Plan: Visit Diagnoses:  1. Arthritis of left knee     Plan: Given her x-ray findings combined with the severity of her right knee pain and the fact that she has tried and failed all forms of conservative treatment including multiple injections of steroid and hyaluronic acid she does wish proceed with a left knee replacement. We had a long and thorough discussion about the surgery. We went over her x-rays as well as knee replacement models. We had a thorough discussion about the risks and benefits of surgery and what her intraoperative and postoperative course with involved. She does not biggest concern is her young age and the pain and this may cause and that she would need to really push her self therapy postoperative. She says she is willing to do so. All questions were encouraged and answered. I will give her information to my surgery scheduler and some albeit touch with her hopefully in the next week or so about when surgery may be scheduled.  Follow-Up Instructions: Return for 2 weeks post-op.   Orders:  No orders of the defined types were placed in this encounter.  No orders of the defined types were placed in this encounter.     Procedures: No procedures performed   Clinical Data: No additional findings.   Subjective: No chief complaint on file. The patient is referred from Dr. Pearletha Forge to evaluate the severity of her arthritis several left knee and consider knee replacement surgery. She is tried and failed all forms conservative treatment including hyaluronic acid and steroid injections. Short-term activity modification and quad training exercises as well as try to lose weight. She does weigh well over 200 pounds and has lost  weight. Her pain is daily. It is detrimental effect direct is daily living, her quality of life, and her mobility. It has been worsening for last 3-4 years now. It is 10 out of 10.  HPI  Review of Systems Currently denies any chest pain, headache, shortness of breath, fever, chills, nausea, vomiting. She is not a diabetic.  Objective: Vital Signs: There were no vitals taken for this visit.  Physical Exam She is alert and oriented 3 and in no acute distress she walks with significant limp Ortho Exam Her left knee has good range of motion. He is ligamentously stable but she does hyperextend. She has significant patellofemoral crepitation as well as medial joint line tenderness. Specialty Comments:  No specialty comments available.  Imaging: No results found.   PMFS History: Patient Active Problem List   Diagnosis Date Noted  . Low back pain 05/17/2016  . Arthritis of left knee 05/08/2016  . Bilateral knee pain 06/27/2015  . Closed right fibular fracture 08/10/2014   Past Medical History:  Diagnosis Date  . Arthritis     No family history on file.  Past Surgical History:  Procedure Laterality Date  . CHOLECYSTECTOMY    . TUBAL LIGATION     Social History   Occupational History  . Not on file.   Social History Main Topics  . Smoking status: Current Every Day Smoker    Packs/day:  0.00    Types: Cigarettes  . Smokeless tobacco: Never Used  . Alcohol use No  . Drug use: No  . Sexual activity: Not on file

## 2016-07-19 ENCOUNTER — Telehealth: Payer: Self-pay | Admitting: Family Medicine

## 2016-07-19 NOTE — Telephone Encounter (Signed)
She was given two medicines - diclofenac and robaxin.  Does she need one of these specifically?  Both?

## 2016-07-19 NOTE — Telephone Encounter (Signed)
Patient requesting refill of medication prescribed for the inflammation in her knee and back   If able to refill, patient [facility] [address]

## 2016-07-22 ENCOUNTER — Emergency Department (HOSPITAL_BASED_OUTPATIENT_CLINIC_OR_DEPARTMENT_OTHER)
Admission: EM | Admit: 2016-07-22 | Discharge: 2016-07-22 | Disposition: A | Discharge status: Home / Self Care | Payer: No Typology Code available for payment source | Attending: Emergency Medicine | Admitting: Emergency Medicine

## 2016-07-22 ENCOUNTER — Encounter (HOSPITAL_BASED_OUTPATIENT_CLINIC_OR_DEPARTMENT_OTHER): Payer: Self-pay | Admitting: Emergency Medicine

## 2016-07-22 DIAGNOSIS — F17210 Nicotine dependence, cigarettes, uncomplicated: Secondary | ICD-10-CM | POA: Insufficient documentation

## 2016-07-22 DIAGNOSIS — M62838 Other muscle spasm: Secondary | ICD-10-CM

## 2016-07-22 DIAGNOSIS — G44209 Tension-type headache, unspecified, not intractable: Secondary | ICD-10-CM | POA: Insufficient documentation

## 2016-07-22 MED ORDER — DIAZEPAM 5 MG PO TABS
5.0000 mg | ORAL_TABLET | Freq: Once | ORAL | Status: AC
  Administered 2016-07-22: 5 mg via ORAL
  Filled 2016-07-22: qty 1

## 2016-07-22 MED ORDER — NAPROXEN 500 MG PO TABS
500.0000 mg | ORAL_TABLET | Freq: Two times a day (BID) | ORAL | 0 refills | Status: DC
Start: 2016-07-22 — End: 2017-01-17

## 2016-07-22 MED ORDER — CYCLOBENZAPRINE HCL 10 MG PO TABS: 10.0000 mg | ORAL_TABLET | Freq: Two times a day (BID) | ORAL | 0 refills | Status: DC | PRN

## 2016-07-22 MED ORDER — KETOROLAC TROMETHAMINE 60 MG/2ML IM SOLN
60.0000 mg | Freq: Once | INTRAMUSCULAR | Status: AC
  Administered 2016-07-22: 60 mg via INTRAMUSCULAR
  Filled 2016-07-22: qty 2

## 2016-07-22 NOTE — ED Triage Notes (Signed)
Post neck and right shoulder pain x 1 week. Denies recent injury, pain worse with movement .Has been taking Ibuprofen and robaxin , with no relief

## 2016-07-22 NOTE — Discharge Instructions (Signed)
Urine diagnosed with a muscle spasm of your right trapezius muscle. He may use heat to help relax the muscle. You been given a prescription for Flexeril-please take either Flexeril or methocarbamol, but not both medications for ear pain. I recommend taking 1000 mg of Tylenol 4 times a day for the next week. I also recommend taking either ibuprofen OR naproxen for pain and inflammation.

## 2016-07-22 NOTE — ED Provider Notes (Signed)
[facility] Provider Note   CSN: [id number] Arrival date & time: 07/22/16  0720     History   Chief Complaint Chief Complaint  Patient presents with  . Muscle Pain    HPI Jenna [name] is a 46 y.o. female.  HPI   Woke up Monday with right sided neck pain. Pain like tightness to right side of neck radiating to shoulder. No trauma.  Over the course of the week it has been getting worse, now radiating to head and causing headache.  Taking ibuprofen and medicine starts with M (methocarbamol).  Thought it would improve but it is not going anywhere.  No fevers. Nausea but no vomiting.   Past Medical History:  Diagnosis Date  . Arthritis     Patient Active Problem List   Diagnosis Date Noted  . Low back pain 05/17/2016  . Arthritis of left knee 05/08/2016  . Bilateral knee pain 06/27/2015  . Closed right fibular fracture 08/10/2014    Past Surgical History:  Procedure Laterality Date  . CHOLECYSTECTOMY    . TUBAL LIGATION      OB History    No data available       Home Medications    Prior to Admission medications   Medication Sig Start Date End Date Taking? Authorizing Provider  methocarbamol (ROBAXIN) 500 MG tablet Take 500-1,000 mg by mouth. 04/21/16  Yes [provider]  cyclobenzaprine (FLEXERIL) 10 MG tablet Take 1 tablet (10 mg total) by mouth 2 (two) times daily as needed for muscle spasms. 07/22/16   Alvira Monday, MD  diclofenac (VOLTAREN) 75 MG EC tablet Take 75 mg by mouth. 04/21/16   [provider]  naproxen (NAPROSYN) 500 MG tablet Take 1 tablet (500 mg total) by mouth 2 (two) times daily with a meal. 07/22/16   Alvira Monday, MD    Family History No family history on file.  Social History Social History  Substance Use Topics  . Smoking status: Current Every Day Smoker    Packs/day: 1.00    Types: Cigarettes  . Smokeless tobacco: Never Used  . Alcohol use No     Allergies   Patient has no known  allergies.   Review of Systems Review of Systems  Constitutional: Negative for fever.  HENT: Negative for sore throat.   Eyes: Negative for visual disturbance.  Respiratory: Negative for cough and shortness of breath.   Cardiovascular: Negative for chest pain.  Gastrointestinal: Positive for nausea. Negative for abdominal pain, diarrhea and vomiting.  Genitourinary: Negative for difficulty urinating.  Musculoskeletal: Positive for myalgias, neck pain and neck stiffness. Negative for back pain.  Skin: Negative for rash.  Neurological: Positive for headaches. Negative for syncope. Numbness: unchanged in both hands.     Physical Exam Updated Vital Signs BP 118/83 (BP Location: Left Arm)   Pulse 100   Temp 98.6 F (37 C) (Oral)   Resp 18   Ht 5\' 4"  (1.626 m)   Wt 273 lb (123.8 kg)   LMP 07/20/2016 (Exact Date)   SpO2 100%   BMI 46.86 kg/m   Physical Exam  Constitutional: She is oriented to person, place, and time. She appears well-developed and well-nourished. No distress.  HENT:  Head: Normocephalic and atraumatic.  Eyes: Conjunctivae and EOM are normal.  Neck: Normal range of motion.  Cardiovascular: Normal rate, regular rhythm, normal heart sounds and intact distal pulses.  Exam reveals no gallop and no friction rub.   No murmur heard. Pulmonary/Chest: Effort normal  and breath sounds normal. No respiratory distress. She has no wheezes. She has no rales.  Abdominal: Soft. She exhibits no distension. There is no tenderness. There is no guarding.  Musculoskeletal: She exhibits no edema.       Cervical back: She exhibits tenderness and spasm. She exhibits no bony tenderness.  Neurological: She is alert and oriented to person, place, and time.  Skin: Skin is warm and dry. No rash noted. She is not diaphoretic. No erythema.  Nursing note and vitals reviewed.    ED Treatments / Results  Labs (all labs ordered are listed, but only abnormal results are displayed) Labs  Reviewed - No data to display  EKG  EKG Interpretation None       Radiology No results found.  Procedures Procedures (including critical care time)  Medications Ordered in ED Medications  ketorolac (TORADOL) injection 60 mg (not administered)  diazepam (VALIUM) tablet 5 mg (not administered)     Initial Impression / Assessment and Plan / ED Course  I have reviewed the triage vital signs and the nursing notes.  Pertinent labs & imaging results that were available during my care of the patient were reviewed by me and considered in my medical decision making (see chart for details).     46 year old female presents with 5 days of right-sided neck pain. Patient has good strength and sensation bilaterally, no signs of cord compression. No history to suggest trauma or fracture. Symptoms began slowly, have low suspicion for acute bleed. No sign of intra-abdominal or intrathoracic etiology of pain. Suspect this is most likely right-sided cervical muscle and trapezius strain. Patient was given Toradol and Valium in the emergency department. Given a prescription for Flexeril and naproxen, and told to take either that or the methocarbamol that she has been taking at home. Recommended continued supportive care, primary care physician follow-up. Patient discharged in stable condition with understanding of reasons to return.   Final Clinical Impressions(s) / ED Diagnoses   Final diagnoses:  Trapezius muscle spasm  Tension headache    New Prescriptions New Prescriptions   CYCLOBENZAPRINE (FLEXERIL) 10 MG TABLET    Take 1 tablet (10 mg total) by mouth 2 (two) times daily as needed for muscle spasms.   NAPROXEN (NAPROSYN) 500 MG TABLET    Take 1 tablet (500 mg total) by mouth 2 (two) times daily with a meal.     Alvira MondaySchlossman, Shardea Cwynar, MD 07/22/16 [phone]

## 2016-07-23 MED ORDER — METHOCARBAMOL 500 MG PO TABS: 500.0000 mg | ORAL_TABLET | Freq: Three times a day (TID) | ORAL | 1 refills | Status: DC | PRN

## 2016-07-23 NOTE — Telephone Encounter (Signed)
It is for the Robaxin.

## 2016-07-23 NOTE — Telephone Encounter (Signed)
Sent to pharmacy. Thanks

## 2016-10-01 ENCOUNTER — Ambulatory Visit: Payer: Self-pay

## 2016-10-08 ENCOUNTER — Telehealth: Payer: Self-pay | Admitting: Family Medicine

## 2016-10-08 NOTE — Telephone Encounter (Signed)
Ok thanks.  Usually they will send us a request for information, sometimes I'll get something to fill out as well.

## 2016-10-08 NOTE — Telephone Encounter (Signed)
Patient calling to inform that she has given Dr. Lazaro ArmsHudnall's name to her disability lawyers to obtain information about her medical history for her ongoing case

## 2017-01-17 ENCOUNTER — Ambulatory Visit (INDEPENDENT_AMBULATORY_CARE_PROVIDER_SITE_OTHER): Payer: Self-pay | Admitting: Sports Medicine

## 2017-01-17 ENCOUNTER — Encounter: Payer: Self-pay | Admitting: Sports Medicine

## 2017-01-17 DIAGNOSIS — M170 Bilateral primary osteoarthritis of knee: Secondary | ICD-10-CM

## 2017-01-17 DIAGNOSIS — E669 Obesity, unspecified: Secondary | ICD-10-CM | POA: Insufficient documentation

## 2017-01-17 MED ORDER — MELOXICAM 15 MG PO TABS: ORAL_TABLET | ORAL | 3 refills | Status: DC

## 2017-01-17 NOTE — Assessment & Plan Note (Signed)
Has had steroid injections, a few NSAIDs, viscosupplementation without improvement. Dr. Magnus IvanBlackman with orthopedic surgery told her she was too young for knee replacement. Switching to meloxicam, she will return for custom orthotics, adding formal physical therapy, and referring her for bariatric surgery.

## 2017-01-17 NOTE — Progress Notes (Signed)
   Subjective:    I'm seeing this patient as a consultation for: Jorge Nyomana Hayes, FNP  CC: Bilateral knee pain  HPI: This is a pleasant 46 year old female, she spends all day on her feet, she has had knee pain for years now, has seen Dr. Pearletha ForgeHudnall, she had steroid injections as well as Visco supplementation without improvement, she has had a multiple NSAIDs with only minimal improvement.  She was referred to orthopedic surgery after x-rays showed end-stage osteoarthritis, total knee arthroplasty was not offered.  Pain is moderate, persistent, localized to the joint lines without radiation.  Past medical history, Surgical history, Family history not pertinant except as noted below, Social history, Allergies, and medications have been entered into the medical record, reviewed, and no changes needed.   Review of Systems: No headache, visual changes, nausea, vomiting, diarrhea, constipation, dizziness, abdominal pain, skin rash, fevers, chills, night sweats, weight loss, swollen lymph nodes, body aches, joint swelling, muscle aches, chest pain, shortness of breath, mood changes, visual or auditory hallucinations.   Objective:   General: Well Developed, well nourished, and in no acute distress.  Neuro:  Extra-ocular muscles intact, able to move all 4 extremities, sensation grossly intact.  Deep tendon reflexes tested were normal. Psych: Alert and oriented, mood congruent with affect. ENT:  Ears and nose appear unremarkable.  Hearing grossly normal. Neck: Unremarkable overall appearance, trachea midline.  No visible thyroid enlargement. Eyes: Conjunctivae and lids appear unremarkable.  Pupils equal and round. Skin: Warm and dry, no rashes noted.  Cardiovascular: Pulses palpable, no extremity edema. Bilateral knees: Normal to inspection with no erythema or effusion or obvious bony abnormalities. Tender at the joint lines ROM normal in flexion and extension and lower leg rotation. Ligaments with solid  consistent endpoints including ACL, PCL, LCL, MCL. Negative Mcmurray's and provocative meniscal tests. Non painful patellar compression. Patellar and quadriceps tendons unremarkable. Hamstring and quadriceps strength is normal.  Impression and Recommendations:   This case required medical decision making of moderate complexity.  Primary osteoarthritis of both knees Has had steroid injections, a few NSAIDs, viscosupplementation without improvement. Dr. Magnus IvanBlackman with orthopedic surgery told her she was too young for knee replacement. Switching to meloxicam, she will return for custom orthotics, adding formal physical therapy, and referring her for bariatric surgery.  Morbid obesity [facility]) Referral for bariatric surgery, I think we should consider this before considering knee replacement.  ___________________________________________ Ihor Austinhomas J. Benjamin Stainhekkekandam, M.D., ABFM., CAQSM. Primary Care and Sports Medicine [facility]  Adjunct Instructor of Family Medicine  [employer]

## 2017-01-17 NOTE — Assessment & Plan Note (Signed)
Referral for bariatric surgery, I think we should consider this before considering knee replacement.

## 2017-01-24 ENCOUNTER — Encounter: Payer: Self-pay | Admitting: Sports Medicine

## 2017-01-24 DIAGNOSIS — Z0189 Encounter for other specified special examinations: Secondary | ICD-10-CM

## 2017-01-29 ENCOUNTER — Encounter: Payer: Self-pay | Admitting: Sports Medicine

## 2017-01-29 DIAGNOSIS — Z0189 Encounter for other specified special examinations: Secondary | ICD-10-CM

## 2017-02-25 ENCOUNTER — Telehealth: Payer: Self-pay

## 2017-02-25 MED ORDER — CELECOXIB 200 MG PO CAPS: ORAL_CAPSULE | ORAL | 2 refills | Status: DC

## 2017-02-25 NOTE — Telephone Encounter (Signed)
Pt left VM stating she can't afford to have weight loss surgery at this time due to out of pocket costs and would like to know what is the next step. Pt also states she has leg cramps from meloxicam and would like to know if she can take something different.

## 2017-02-25 NOTE — Telephone Encounter (Signed)
Switching her to Celebrex, also I would like her to touch base with her primary care provider to discuss weight loss medication.

## 2017-02-28 NOTE — Telephone Encounter (Signed)
Left VM with information.  

## 2017-08-01 ENCOUNTER — Encounter: Payer: Self-pay | Admitting: Family Medicine

## 2017-08-01 ENCOUNTER — Ambulatory Visit (INDEPENDENT_AMBULATORY_CARE_PROVIDER_SITE_OTHER): Payer: Self-pay | Admitting: Family Medicine

## 2017-08-01 DIAGNOSIS — M170 Bilateral primary osteoarthritis of knee: Secondary | ICD-10-CM

## 2017-08-01 MED ORDER — NAPROXEN 500 MG PO TABS: 500.0000 mg | ORAL_TABLET | Freq: Two times a day (BID) | ORAL | 2 refills | Status: DC | PRN

## 2017-08-01 NOTE — Patient Instructions (Signed)
Your pain is due to arthritis. Call us when your cone coverage goes through - then we will send you for physical therapy, to consult with Dr. Magnus Ivan again, and to dietitian.  It's possible you'll have to lose some weight before you can have the surgery. These are the different medications you can take for this: Tylenol  1-2 tabs three times a day for pain. Capsaicin, aspercreme, or biofreeze topically up to four times a day may also help with pain. Some supplements that may help for arthritis: Boswellia extract, curcumin, pycnogenol Naproxen  twice a day with food for pain and inflammation. Cortisone injections are an option but these and the gel shots didn't help you. It's important that you continue to stay active. Straight leg raises, knee extensions 3 sets of 10 once a day (add ankle weight if these become too easy). Consider physical therapy to strengthen muscles around the joint that hurts to take pressure off of the joint itself. Shoe inserts with good arch support may be helpful. Heat or ice 15 minutes at a time 3-4 times a day as needed to help with pain. Water aerobics and cycling with low resistance are the best two types of exercise for arthritis though any exercise is ok as long as it doesn't worsen the pain.

## 2017-08-05 ENCOUNTER — Encounter: Payer: Self-pay | Admitting: Family Medicine

## 2017-08-05 NOTE — Progress Notes (Signed)
PCP: Patient, No Pcp Per  Subjective:   HPI: Patient is a 47 y.o. female here for bilateral knee pain  4/13: Patient reports she's had several months of bilateral knee pain. Pain is anterior, left worse than right and mostly lateral. Pain level 5/10, sharp. No swelling, bruising. No known injury. Previously tried naproxen, tylenol, ibuprofen. No skin changes, numbness.  5/18: Patient reports her hip has improved since last visit but left knee is fairly severe. Pain level 7/10, sharp, anterior. Not really noticed a difference with cortisone injection last visit. Worse with ambulation. Pain also has a burning quality to it. Better with rest. No skin changes, numbness.  03/02/16: Patient reports she continues to have anterior left knee pain at 5/10 level, sharp. Worse with prolonged standing, bending, squatting, lifting. Taking aleve as needed. Some swelling, tightness. 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 No skin changes, numbness.  05/08/16: Patient returns today to start supartz series in left knee. Pain level 5/10, sharp, anterior. No skin changes, numbness, swelling.  3/6: Patient returns for second supartz for left knee but also for low back pain. Has had low back pain since 2 months ago. No acute injury or trauma. Has had problems with back in the past. Pain central lumbar region and radiates laterally bilaterally. Worse with prolonged sitting or standing. No radiation into legs. Pain level 7/10, sharp. No bowel/bladder dysfunction.  3/16: Patient returns for third supartz. She reports pain has improved since last visit. Feels a cramp in thigh area at times. No skin changes, numbness.  08/01/17: Patient reports her knees are very painful. Pain level 8/10 left knee anteromedially, 7/10 right knee. She did not get much benefit with viscosupplementation or prior cortisone injection. She was under the impression she was not a surgical candidate due to  her age but note from her visit with Dr. Magnus Ivan was they were going to go ahead with knee replacement. She does not have insurance currently - given cone coverage information today. meloxicam caused cramping, celebrex was too expensive so not taking these. Pain is worse with standing, sitting, walking bilaterally. No skin changes, numbness. No new injuries.  Past Medical History:  Diagnosis Date  . Arthritis     Current Outpatient Medications on File Prior to Visit  Medication Sig Dispense Refill  . celecoxib (CELEBREX) 200 MG capsule One to 2 tablets by mouth daily as needed for pain. 60 capsule 2   No current facility-administered medications on file prior to visit.     Past Surgical History:  Procedure Laterality Date  . CHOLECYSTECTOMY    . TUBAL LIGATION      No Known Allergies  Social History   Socioeconomic History  . Marital status: Single    Spouse name: Not on file  . Number of children: Not on file  . Years of education: Not on file  . Highest education level: Not on file  Occupational History  . Not on file  Social Needs  . Financial resource strain: Not on file  . Food insecurity:    Worry: Not on file    Inability: Not on file  . Transportation needs:    Medical: Not on file    Non-medical: Not on file  Tobacco Use  . Smoking status: Current Every Day Smoker    Packs/day: 1.00    Types: Cigarettes  . Smokeless tobacco: Never Used  Substance and Sexual Activity  . Alcohol use: No    Alcohol/week: 0.0 oz  . Drug  use: No  . Sexual activity: Not on file  Lifestyle  . Physical activity:    Days per week: Not on file    Minutes per session: Not on file  . Stress: Not on file  Relationships  . Social connections:    Talks on phone: Not on file    Gets together: Not on file    Attends religious service: Not on file    Active member of club or organization: Not on file    Attends meetings of clubs or organizations: Not on file    Relationship  status: Not on file  . Intimate partner violence:    Fear of current or ex partner: Not on file    Emotionally abused: Not on file    Physically abused: Not on file    Forced sexual activity: Not on file  Other Topics Concern  . Not on file  Social History Narrative  . Not on file    History reviewed. No pertinent family history.  BP (!) 144/77   Pulse 87   Ht  (1.626 m)   Wt 285 lb (129.3 kg)   BMI 48.92 kg/m   Review of Systems: See HPI above.    Objective:  Physical Exam:  Gen: NAD, comfortable in exam room  Left knee: No gross deformity, ecchymoses, swelling. TTP medial and lateral joint lines.  No other tenderness. FROM with 5/5 strength. Negative ant/post drawers. Negative valgus/varus testing. Negative lachmanns. Negative mcmurrays, apleys, patellar apprehension. NV intact distally.  Right knee: No gross deformity, ecchymoses, swelling. TTP medial and lateral joint lines.  No other tenderness. FROM with 5/5 strength. Negative ant/post drawers. Negative valgus/varus testing. Negative lachmanns. Negative mcmurrays, apleys, patellar apprehension. NV intact distally.    Assessment & Plan:  1. Bilateral knee pain - 2/2 known mod-severe tricompartmental arthritis.  Left knee worse than right.  Did not improve with cortisone injection or supartz series.  She is going to get cone coverage.  After she does we will send her for physical therapy, to consult with Dr. Magnus Ivan again.  She has gained weight since her last visit - concern this may be too much to be a surgical candidate now - will also send her to dietitian.  Tylenol, rx naproxen, topical medication, supplements that may help reviewed.  Heat/ice.

## 2017-08-05 NOTE — Assessment & Plan Note (Signed)
2/2 known mod-severe tricompartmental arthritis.  Left knee worse than right.  Did not improve with cortisone injection or supartz series.  She is going to get cone coverage.  After she does we will send her for physical therapy, to consult with Dr. Magnus Ivan again.  She has gained weight since her last visit - concern this may be too much to be a surgical candidate now - will also send her to dietitian.  Tylenol, rx naproxen, topical medication, supplements that may help reviewed.  Heat/ice.

## 2017-09-26 ENCOUNTER — Ambulatory Visit: Payer: Self-pay

## 2017-11-12 ENCOUNTER — Ambulatory Visit: Payer: Self-pay

## 2017-12-03 ENCOUNTER — Encounter: Payer: Self-pay | Admitting: Family Medicine

## 2017-12-03 ENCOUNTER — Other Ambulatory Visit: Payer: Self-pay

## 2017-12-03 ENCOUNTER — Ambulatory Visit: Payer: Self-pay | Attending: Family Medicine | Admitting: Family Medicine

## 2017-12-03 VITALS — BP 130/84 | HR 89 | Temp 98.4°F | Resp 17 | Ht 64.0 in | Wt 300.6 lb

## 2017-12-03 DIAGNOSIS — Z9049 Acquired absence of other specified parts of digestive tract: Secondary | ICD-10-CM | POA: Insufficient documentation

## 2017-12-03 DIAGNOSIS — H538 Other visual disturbances: Secondary | ICD-10-CM

## 2017-12-03 DIAGNOSIS — Z6841 Body Mass Index (BMI) 40.0 and over, adult: Secondary | ICD-10-CM | POA: Insufficient documentation

## 2017-12-03 DIAGNOSIS — R519 Headache, unspecified: Secondary | ICD-10-CM

## 2017-12-03 DIAGNOSIS — R51 Headache: Secondary | ICD-10-CM

## 2017-12-03 DIAGNOSIS — G479 Sleep disorder, unspecified: Secondary | ICD-10-CM

## 2017-12-03 DIAGNOSIS — R0683 Snoring: Secondary | ICD-10-CM | POA: Insufficient documentation

## 2017-12-03 DIAGNOSIS — G8929 Other chronic pain: Secondary | ICD-10-CM

## 2017-12-03 DIAGNOSIS — M545 Low back pain: Secondary | ICD-10-CM

## 2017-12-03 DIAGNOSIS — M170 Bilateral primary osteoarthritis of knee: Secondary | ICD-10-CM

## 2017-12-03 DIAGNOSIS — F17210 Nicotine dependence, cigarettes, uncomplicated: Secondary | ICD-10-CM | POA: Insufficient documentation

## 2017-12-03 DIAGNOSIS — Z8249 Family history of ischemic heart disease and other diseases of the circulatory system: Secondary | ICD-10-CM | POA: Insufficient documentation

## 2017-12-03 DIAGNOSIS — J309 Allergic rhinitis, unspecified: Secondary | ICD-10-CM

## 2017-12-03 DIAGNOSIS — M25562 Pain in left knee: Secondary | ICD-10-CM

## 2017-12-03 DIAGNOSIS — Z833 Family history of diabetes mellitus: Secondary | ICD-10-CM | POA: Insufficient documentation

## 2017-12-03 DIAGNOSIS — M62830 Muscle spasm of back: Secondary | ICD-10-CM | POA: Insufficient documentation

## 2017-12-03 DIAGNOSIS — M25561 Pain in right knee: Secondary | ICD-10-CM

## 2017-12-03 DIAGNOSIS — Z791 Long term (current) use of non-steroidal anti-inflammatories (NSAID): Secondary | ICD-10-CM

## 2017-12-03 LAB — POCT GLYCOSYLATED HEMOGLOBIN (HGB A1C): Hemoglobin A1C: 5.6 % (ref 4.0–5.6)

## 2017-12-03 LAB — GLUCOSE, POCT (MANUAL RESULT ENTRY): POC Glucose: 90 mg/dL (ref 70–99)

## 2017-12-03 MED ORDER — CYCLOBENZAPRINE HCL 10 MG PO TABS: 10.0000 mg | ORAL_TABLET | Freq: Every day | ORAL | 0 refills | Status: DC

## 2017-12-03 MED ORDER — NAPROXEN 500 MG PO TABS: 500.0000 mg | ORAL_TABLET | Freq: Two times a day (BID) | ORAL | 3 refills | Status: DC

## 2017-12-03 MED FILL — CYCLOBENZAPRINE 10 MG TAB: 10 | 10 days supply | Qty: 30 | Fill #0

## 2017-12-03 MED FILL — NAPROXEN 500 MG TABLET: 500 | 30 days supply | Qty: 60 | Fill #0

## 2017-12-03 NOTE — Progress Notes (Signed)
Subjective:    Patient ID: Jenna [name], female    DOB: 22-Jun-1970, 47 y.o.   MRN: [id number]  HPI 47 yo female who is new to the practice.  Patient states that she has multiple issues at today's visit.  Patient states that her patient has been blurry for the past year.  Patient also states that she has been seeing leg, blue or black spots floating in her visual field and for the past month, patient has started feeling an occasional flashing white light in her visual field.  Patient also states that for the past 2 years she has had recurrent headaches which are frontal and at the back of her neck mostly on the right or mid neck.  Patient states the pain sometimes shoots up from the right or middle of her neck into her scalp.  Patient states that she sometimes feels as if there is a nodule at the back of her neck and when she presses on this area, the pain increases.  Pain can range from a 6-8 on a 0-to-10 scale.  Patient taking over-the-counter Advil or Aleve for headaches however she also takes these for her knee pain and the medicine is becoming less and she will try taking the medication and also has a sleep aid as her headaches are better after she sleeps.      Patient with complaint of chronic issues with back pain.  Patient states that she had x-rays about 4 years ago and was told that she had some arthritis in her back.  Patient also with chronic knee pain.  Patient states she has seen a sports medicine physician and had cortisone shots and gel shots in her knees.  Patient was told that the next step would be surgery.  Patient states that she has not worked in the past year due to her knee and back pain. 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       Patient reports that she is currently single and unemployed. Patient does smoke about 1/3 of a pack of cigarettes per day. No alcohol use.  Surgical history consists of tubal ligation and removal of gallstones. Family history is positive for DM, HTN and tendonitis in her mom and her father has OA of his knees and has had knee replacement surgery. On ROS, patient with urinary frequency, increased thirst, desire to eat ice and heavy menses.   Past Medical History:  Diagnosis Date  . Arthritis    Past Surgical History:  Procedure Laterality Date  . CHOLECYSTECTOMY    . TUBAL LIGATION     Social History   Tobacco Use  . Smoking status: Current Every Day Smoker    Packs/day: 1.00    Types: Cigarettes  . Smokeless tobacco: Never Used  Substance Use Topics  . Alcohol use: No    Alcohol/week: 0.0 standard drinks  . Drug use: No  No Known Allergies    Review of Systems  Constitutional: Positive for activity change and fatigue. Negative for diaphoresis and fever.  HENT: Positive for congestion. Negative for postnasal drip, sore throat and trouble swallowing.   Respiratory: Positive for shortness of breath (Patient gets winded with activity). Negative for cough.   Cardiovascular: Negative for chest pain, palpitations and leg swelling.  Gastrointestinal: Negative for abdominal pain, blood in stool and nausea.  Endocrine: Positive for polydipsia, polyphagia and polyuria.  Genitourinary: Positive for frequency. Negative for dysuria.  Musculoskeletal: Positive for arthralgias, back pain, gait problem, joint swelling and myalgias.  Neurological: Positive for headaches. Negative for dizziness.       Objective:   Physical Exam BP 130/84   Pulse 89   Temp 98.4 F (36.9 C) (Oral)   Resp 17   Ht 5\' 4"  (1.626 m)   Wt (!) 300 lb 9.6 oz (136.4 kg)   LMP 12/01/2017 (Exact Date)   SpO2 97%   BMI 51.60 kg/m  vital signs reviewed Gen- WNWD morbidly obese female in NAD EENT- conjunctiva normal, EOMI intact; patient with normal TM's, pale edematous nasal turbinates with clear to white nasal discharge, patient with large tonsils,  large tongue base and narrowed posterior airway Neck- supple, no LAD, no thyromegaly Lungs- CTA bilaterally CV- RRR ABD- soft and non-tender Back- patient with tenderness over the posterior occipital ridges of the scalp right greater than left and spasm over the posterior neck and cervical paraspinous muscles. Patient with lumbosacal lordosis and lumbosacral tenderness to palpation as well as moderate thoracolumbar paraspinous spasm Musculoskeletal- patient with bilateral knee joint line tenderness Ext- mild, non-pitting distal LE edema Neuro- CN's 2-12 are grossly normal     Assessment & Plan:  1. Bilateral chronic knee pain On review of chart, patient has had X-rays of her knees done in 2017 showing 3 compartment OA of the left knee that is moderate in severity. Patient has received injections at Sports Medicine. Patient has also been seen by Orthopedics  in April of 2018. Per Ortho note, patient has tried and failed steroid and hyaluronic acid injections and with the severity of her pain, knee replacement surgery was discussed with the patient.  Patient is given RX for naproxen but admits that not much is helping at this time. Patient will also be referred back to Orthopedics. - naproxen (NAPROSYN) 500 MG tablet; Take 1 tablet (500 mg total) by mouth 2 (two) times daily with a meal. Take after eating as needed for pain  Dispense: 60 tablet; Refill: 3 - Ambulatory referral to Orthopedic Surgery  2. Primary osteoarthritis of both knees Patient has had x-rays showing OA of her knees and per Ortho notes, her lastest x-rays have shown severe OA and surgery has been discussed with her and she will be referred back to Orthopedics. - Ambulatory referral to Orthopedic Surgery  3. Frequent headaches Based on exam, I feel that patient is having both tension type headaches as well as headaches related to allergic rhinitis as well as possible sleep apnea related headaches. Will also check for electrolyte  abnormalities and anemia or other abnormalities.  - Basic Metabolic Panel - CBC with Differential - Ambulatory referral to Optometry  4. Blurred vision I will check him to look for possible diabetes as well as random glucose level.  Patient will also have BMP to look for electrolyte abnormality and CBC.  Patient will be referred to optometry for further evaluation of her blurred vision. - HgB A1c - Glucose (CBG) - Basic Metabolic Panel - CBC with Differential - Ambulatory referral to Optometry  5. Long term (current) use of non-steroidal anti-inflammatories (nsaid) Patient with long-term use of nonsteroidal anti-inflammatories for her knee pain and other joint pain.  Patient denies abdominal pain no nausea but is at increased risk for gastritis as well as renal insufficiency.  Patient will have BMP and CBC at today's visit - Basic Metabolic Panel - CBC with Differential  6. Chronic midline low back  pain, with sciatica presence unspecified Patient with complaint of chronic low back pain and patient likely also has some lumbar radiculopathy.  Patient does have marked muscle spasm on exam and will be placed on Flexeril to take at bedtime to see if this helps with her back pain and prescription provided for naproxen to take as needed for both knee and back pain - cyclobenzaprine (FLEXERIL) 10 MG tablet; Take 1 tablet (10 mg total) by mouth at bedtime. As needed for muscle spasm  Dispense: 30 tablet; Refill: 0  7. Morbid obesity (HCC) Patient with morbid obesity and patient with complaint of increased weight gain after she had to stop working due to her chronic back and knee pain.  Unfortunately, patient's ability to have knee surgery may be delayed secondary to weight gain.  Patient is encouraged to remain as mobile as possible and to also follow a low fat/low calorie diet  8. Sleep disturbance Patient admits to snoring as well as non-restful sleep and daytime fatigue.  Patient also with  recurrent headaches and I suspect that she may have sleep disturbance such as sleep apnea.  Patient would like future referral for sleep testing  9. Allergic rhinitis, unspecified seasonality, unspecified trigger Patient appears to have chronic allergic rhinitis and patient is encouraged to try an over-the-counter medication such as loratadine but patient should call or return if she does decide that she would like to try prescription medication to help with her nasal allergy symptoms  An After Visit Summary was printed and given to the patient.  Return in about 4 weeks (around 12/31/2017) for headaches/blurred vision/knee and back pain.

## 2017-12-04 LAB — BASIC METABOLIC PANEL WITH GFR
BUN/Creatinine Ratio: 10 (ref 9–23)
BUN: 8 mg/dL (ref 6–24)
CO2: 21 mmol/L (ref 20–29)
Calcium: 8.7 mg/dL (ref 8.7–10.2)
Chloride: 111 mmol/L — ABNORMAL HIGH (ref 96–106)
Creatinine, Ser: 0.82 mg/dL (ref 0.57–1.00)
GFR calc Af Amer: 99 mL/min/1.73
GFR calc non Af Amer: 86 mL/min/1.73
Glucose: 83 mg/dL (ref 65–99)
Potassium: 4.6 mmol/L (ref 3.5–5.2)
Sodium: 144 mmol/L (ref 134–144)

## 2017-12-04 LAB — CBC WITH DIFFERENTIAL/PLATELET
Basophils Absolute: 0 x10E3/uL (ref 0.0–0.2)
Basos: 0 %
EOS (ABSOLUTE): 0.3 x10E3/uL (ref 0.0–0.4)
Eos: 5 %
Hematocrit: 34.2 % (ref 34.0–46.6)
Hemoglobin: 10.8 g/dL — ABNORMAL LOW (ref 11.1–15.9)
Immature Grans (Abs): 0 x10E3/uL (ref 0.0–0.1)
Immature Granulocytes: 0 %
Lymphocytes Absolute: 3.2 x10E3/uL — ABNORMAL HIGH (ref 0.7–3.1)
Lymphs: 45 %
MCH: 27.6 pg (ref 26.6–33.0)
MCHC: 31.6 g/dL (ref 31.5–35.7)
MCV: 88 fL (ref 79–97)
Monocytes Absolute: 0.5 x10E3/uL (ref 0.1–0.9)
Monocytes: 6 %
Neutrophils Absolute: 3 x10E3/uL (ref 1.4–7.0)
Neutrophils: 44 %
Platelets: 295 x10E3/uL (ref 150–450)
RBC: 3.91 x10E6/uL (ref 3.77–5.28)
RDW: 16.5 % — ABNORMAL HIGH (ref 12.3–15.4)
WBC: 7 x10E3/uL (ref 3.4–10.8)

## 2017-12-05 ENCOUNTER — Telehealth: Payer: Self-pay

## 2017-12-05 NOTE — Telephone Encounter (Signed)
Contacted pt to go over lab results pt didn't answer left a detailed vm informing pt of results and if she has any questions or concerns to give me a call  If pt calls back please give results:   Kidney function is normal but her CBC indicated that she is anemic as her hemoglobin was 10.8. Over the counter iron supplement, ferrous sulfate 365 mg is advised

## 2017-12-13 ENCOUNTER — Ambulatory Visit: Payer: Self-pay

## 2017-12-31 ENCOUNTER — Telehealth: Payer: Self-pay | Admitting: Family Medicine

## 2017-12-31 ENCOUNTER — Other Ambulatory Visit: Payer: Self-pay

## 2017-12-31 ENCOUNTER — Ambulatory Visit: Payer: Self-pay | Attending: Family Medicine | Admitting: Family Medicine

## 2017-12-31 ENCOUNTER — Encounter: Payer: Self-pay | Admitting: Family Medicine

## 2017-12-31 VITALS — BP 128/89 | HR 90 | Temp 98.8°F | Resp 18 | Ht 64.0 in | Wt 297.6 lb

## 2017-12-31 DIAGNOSIS — F17210 Nicotine dependence, cigarettes, uncomplicated: Secondary | ICD-10-CM | POA: Insufficient documentation

## 2017-12-31 DIAGNOSIS — Z791 Long term (current) use of non-steroidal anti-inflammatories (NSAID): Secondary | ICD-10-CM | POA: Insufficient documentation

## 2017-12-31 DIAGNOSIS — D649 Anemia, unspecified: Secondary | ICD-10-CM

## 2017-12-31 DIAGNOSIS — Z9049 Acquired absence of other specified parts of digestive tract: Secondary | ICD-10-CM | POA: Insufficient documentation

## 2017-12-31 DIAGNOSIS — M545 Low back pain, unspecified: Secondary | ICD-10-CM

## 2017-12-31 DIAGNOSIS — M25562 Pain in left knee: Secondary | ICD-10-CM

## 2017-12-31 DIAGNOSIS — G8929 Other chronic pain: Secondary | ICD-10-CM

## 2017-12-31 DIAGNOSIS — H539 Unspecified visual disturbance: Secondary | ICD-10-CM | POA: Insufficient documentation

## 2017-12-31 DIAGNOSIS — M5440 Lumbago with sciatica, unspecified side: Secondary | ICD-10-CM

## 2017-12-31 DIAGNOSIS — Z9889 Other specified postprocedural states: Secondary | ICD-10-CM | POA: Insufficient documentation

## 2017-12-31 DIAGNOSIS — M170 Bilateral primary osteoarthritis of knee: Secondary | ICD-10-CM

## 2017-12-31 DIAGNOSIS — Z6841 Body Mass Index (BMI) 40.0 and over, adult: Secondary | ICD-10-CM

## 2017-12-31 DIAGNOSIS — Z833 Family history of diabetes mellitus: Secondary | ICD-10-CM | POA: Insufficient documentation

## 2017-12-31 DIAGNOSIS — M25561 Pain in right knee: Secondary | ICD-10-CM

## 2017-12-31 DIAGNOSIS — R519 Headache, unspecified: Secondary | ICD-10-CM

## 2017-12-31 DIAGNOSIS — R51 Headache: Secondary | ICD-10-CM

## 2017-12-31 MED ORDER — PREDNISONE 20 MG PO TABS: ORAL_TABLET | ORAL | 1 refills | Status: DC

## 2017-12-31 NOTE — Patient Instructions (Signed)
Anemia Anemia is a condition in which you do not have enough red blood cells or hemoglobin. Hemoglobin is a substance in red blood cells that carries oxygen. When you do not have enough red blood cells or hemoglobin (are anemic), your body cannot get enough oxygen and your organs may not work properly. As a result, you may feel very tired or have other problems. What are the causes? Common causes of anemia include:  Excessive bleeding. Anemia can be caused by excessive bleeding inside or outside the body, including bleeding from the intestine or from periods in women.  Poor nutrition.  Long-lasting (chronic) kidney, thyroid, and liver disease.  Bone marrow disorders.  Cancer and treatments for cancer.  HIV (human immunodeficiency virus) and AIDS (acquired immunodeficiency syndrome).  Treatments for HIV and AIDS.  Spleen problems.  Blood disorders.  Infections, medicines, and autoimmune disorders that destroy red blood cells.  What are the signs or symptoms? Symptoms of this condition include:  Minor weakness.  Dizziness.  Headache.  Feeling heartbeats that are irregular or faster than normal (palpitations).  Shortness of breath, especially with exercise.  Paleness.  Cold sensitivity.  Indigestion.  Nausea.  Difficulty sleeping.  Difficulty concentrating.  Symptoms may occur suddenly or develop slowly. If your anemia is mild, you may not have symptoms. How is this diagnosed? This condition is diagnosed based on:  Blood tests.  Your medical history.  A physical exam.  Bone marrow biopsy.  Your health care provider may also check your stool (feces) for blood and may do additional testing to look for the cause of your bleeding. You may also have other tests, including:  Imaging tests, such as a CT scan or MRI.  Endoscopy.  Colonoscopy.  How is this treated? Treatment for this condition depends on the cause. If you continue to lose a lot of blood,  you may need to be treated at a hospital. Treatment may include:  Taking supplements of iron, vitamin B12, or folic acid.  Taking a hormone medicine (erythropoietin) that can help to stimulate red blood cell growth.  Having a blood transfusion. This may be needed if you lose a lot of blood.  Making changes to your diet.  Having surgery to remove your spleen.  Follow these instructions at home:  Take over-the-counter and prescription medicines only as told by your health care provider.  Take supplements only as told by your health care provider.  Follow any diet instructions that you were given.  Keep all follow-up visits as told by your health care provider. This is important. Contact a health care provider if:  You develop new bleeding anywhere in the body. Get help right away if:  You are very weak.  You are short of breath.  You have pain in your abdomen or chest.  You are dizzy or feel faint.  You have trouble concentrating.  You have bloody or black, tarry stools.  You vomit repeatedly or you vomit up blood. Summary  Anemia is a condition in which you do not have enough red blood cells or enough of a substance in your red blood cells that carries oxygen (hemoglobin).  Symptoms may occur suddenly or develop slowly.  If your anemia is mild, you may not have symptoms.  This condition is diagnosed with blood tests as well as a medical history and physical exam. Other tests may be needed.  Treatment for this condition depends on the cause of the anemia. This information is not intended to replace advice   given to you by your health care provider. Make sure you discuss any questions you have with your health care provider. Document Released: 04/05/2004 Document Revised: 03/30/2016 Document Reviewed: 03/30/2016 Elsevier Interactive Patient Education  [employer].

## 2017-12-31 NOTE — Progress Notes (Signed)
Pain: Patient complained of pain in both knees and back, been going on for a while, 7/8 currently in both.

## 2017-12-31 NOTE — Progress Notes (Signed)
Subjective:    Patient ID: Jenna [name], female    DOB: [birth year], 47 y.o.   MRN: [id number]  HPI       47 year old female status post new patient visit on 12/03/2017 for chronic bilateral knee pain, chronic low back pain, frequent headaches and visual disturbance who returns for follow-up.  Patient was referred to optometry in follow-up of visual disturbance which might also be contributing to her frequent headaches.  Patient was prescribed Flexeril to help with back pain/muscle spasm and patient was given prescription for naproxen to help with knee and back pain.  Patient has been seen and evaluated by orthopedics as well as sports medicine in the past.  Patient had hemoglobin A1c done in follow-up of blurred vision, this was normal at 5.6.  Patient with normal BMP which was done in follow-up of long-term use of nonsteroidal anti-inflammatories as well as patient with complaint of headache and blurred vision.  Patient did have normocytic anemia with hemoglobin of 10.8 on recent labs.      Patient returns for continued follow-up at today's visit.  Patient has not yet been able to go see an eye doctor.  Patient is wearing some prescription glasses that her mother had and patient states that wearing the glasses has helped with her vision and has also helped decrease the frequency of her headaches.  Patient is still having some headaches at her temples, across the front of her headache in the back of her neck.  Headaches tend to be throbbing when they occur.  Patient has not tried the use of any allergy medication to help with a frontal headaches.  Patient reports that she still has some floaters in her visual field.  Patient states that she continues to have knee pain that ranges from about a 7 at today's visit but is usually a 10+.  Patient states that the naproxen did not help with either her back or knee pain.  Patient states that the naproxen did cause her to fall asleep.  Patient states that the knee  pain is sharp, achy and she has a tight sensation in her knees.  Patient states that over the weekend, she had onset of more severe lower back pain with radiation of a sharp pain with some burning sensation down the outside of both thighs.  Patient states that her back pain today is about a 7 but this weekend her back pain was out of 10.  Patient states that her back pain is usually achy and sometimes stinging.  Past Medical History:  Diagnosis Date  . Arthritis    Past Surgical History:  Procedure Laterality Date  . CHOLECYSTECTOMY    . TUBAL LIGATION     Family History  Problem Relation Age of Onset  . Diabetes Mother   . Hypertension Mother    Social History   Tobacco Use  . Smoking status: Current Every Day Smoker    Packs/day: 1.00    Types: Cigarettes  . Smokeless tobacco: Never Used  Substance Use Topics  . Alcohol use: No    Alcohol/week: 0.0 standard drinks  . Drug use: No  No Known Allergies    Review of Systems  Constitutional: Positive for fatigue. Negative for chills, diaphoresis and fever.  Eyes: Positive for visual disturbance. Negative for photophobia.  Respiratory: Negative for cough and shortness of breath.   Cardiovascular: Negative for chest pain, palpitations and leg swelling.  Gastrointestinal: Negative for abdominal pain and nausea.  Endocrine: Negative for polydipsia,  polyphagia and polyuria.  Genitourinary: Negative for dysuria and flank pain.  Musculoskeletal: Positive for arthralgias, back pain, gait problem, joint swelling and myalgias.  Neurological: Negative for dizziness and headaches.       Objective:   Physical Exam BP 128/89   Pulse 90   Temp 98.8 F (37.1 C) (Oral)   Resp 18   Ht 5\' 4"  (1.626 m)   Wt 297 lb 9.6 oz (135 kg)   LMP 12/23/2017 (Approximate)   SpO2 100%   BMI 51.08 kg/m  Nurse's notes and vital signs reviewed General-well-nourished, well-developed obese female in no acute distress.  Patient did have some mild  difficulty getting onto the exam table. Lungs-clear to auscultation bilaterally Cardiovascular-regular rate and rhythm Abdomen-truncal obesity, soft and nontender Back- no CVA tenderness but patient has bilateral back discomfort from her upper mid back to the lumbosacral area with bilateral SI joint tenderness as well as tenderness over the mid lumbosacral spine area.  Patient with discomfort with seated leg raise bilaterally. Musculoskeletal-patient with right medial joint line tenderness and left bilateral joint line tenderness of the knee and decreased knee range of motion Extremities-patient with mild, nonpitting distal lower extremity edema        Assessment & Plan:  1. Low back pain with radiation Patient with acute on chronic exacerbation of low back pain with radiation of pain down the upper legs which stopped above the knees.  Patient given prescription for prednisone taper to take as needed if she has recurrence of radicular type pain. - predniSONE (DELTASONE) 20 MG tablet; 2 pills once daily for two days then 1 pill daily for 2 days then 1/2 pill daily for 4 days; take after eating as needed for back pain with radiation  Dispense: 8 tablet; Refill: 1  2. Bilateral chronic knee pain Patient with continued chronic knee pain and patient states that she is trying to apply for financial assistance program in order to be able to return to see her orthopedic doctor as patient has been told that she will likely need knee replacement to help with her chronic knee pain.  Patient may continue to take naproxen as needed as patient states that even though it does not help relieve her pain it does help her fall asleep.  3. Primary osteoarthritis of both knees Patient with primary osteoarthritis of both knees which is now at end-stage and requiring joint replacement.  Patient is encouraged to try remain mobile and to attempt weight loss as this may help slightly improve her knee pain.  4. Frequent  headaches Patient reports that her headaches have improved after she started wearing prescription glasses however these glasses were not specifically made for her but patient cannot afford to see the optometrist at this time.  Patient however has had some decrease in frequency and intensity of the headaches with the use of prescription glasses.  5. Normocytic anemia Patient's labs from her last visit were reviewed with the patient today.  Patient had a normal BMP.  Patient CBC however showed hemoglobin of 10.2.  Patient states that she does have some iron supplements at home and patient is encouraged to take daily iron supplement for the 3 to 4 weeks to help resolve her anemia  6. Morbid obesity with BMI of 50.0-59.9, adult [facility]) Patient is encouraged to continue efforts at a low-fat/low-carb diet and regular exercise as tolerated in order to reduce her BMI as patient's weight may delay her ability to have knee replacement surgery.  An  After Visit Summary was printed and given to the patient.  Return in about 4 months (around 05/03/2018) for pain/anemia and f/u as needed.

## 2017-12-31 NOTE — Telephone Encounter (Signed)
Pt was called and was informed that we got in contact with [facility] about her OC application and they don't have it, so I informed her that to call us back in a Monday to schedule an appt with financial that way she can apply for CAFA and the OC at the same time and one location.

## 2018-05-05 ENCOUNTER — Ambulatory Visit: Payer: Self-pay | Attending: Family Medicine | Admitting: Family Medicine

## 2018-05-05 ENCOUNTER — Encounter: Payer: Self-pay | Admitting: Family Medicine

## 2018-05-05 VITALS — BP 148/80 | HR 113 | Temp 98.3°F | Resp 18 | Ht 64.0 in | Wt 300.0 lb

## 2018-05-05 DIAGNOSIS — M25562 Pain in left knee: Secondary | ICD-10-CM

## 2018-05-05 DIAGNOSIS — R4589 Other symptoms and signs involving emotional state: Secondary | ICD-10-CM

## 2018-05-05 DIAGNOSIS — M170 Bilateral primary osteoarthritis of knee: Secondary | ICD-10-CM

## 2018-05-05 DIAGNOSIS — M5440 Lumbago with sciatica, unspecified side: Secondary | ICD-10-CM

## 2018-05-05 DIAGNOSIS — G8929 Other chronic pain: Secondary | ICD-10-CM

## 2018-05-05 DIAGNOSIS — F329 Major depressive disorder, single episode, unspecified: Secondary | ICD-10-CM

## 2018-05-05 DIAGNOSIS — M25561 Pain in right knee: Secondary | ICD-10-CM

## 2018-05-05 DIAGNOSIS — D649 Anemia, unspecified: Secondary | ICD-10-CM

## 2018-05-05 DIAGNOSIS — R030 Elevated blood-pressure reading, without diagnosis of hypertension: Secondary | ICD-10-CM

## 2018-05-05 DIAGNOSIS — M545 Low back pain, unspecified: Secondary | ICD-10-CM

## 2018-05-05 MED ORDER — METHOCARBAMOL 500 MG PO TABS: 500.0000 mg | ORAL_TABLET | Freq: Three times a day (TID) | ORAL | 3 refills | Status: DC

## 2018-05-05 MED ORDER — DICLOFENAC SODIUM 1 % TD GEL: 4.0000 g | Freq: Four times a day (QID) | TRANSDERMAL | 6 refills | Status: DC

## 2018-05-05 MED ORDER — DULOXETINE HCL 30 MG PO CPEP: 30.0000 mg | ORAL_CAPSULE | Freq: Every day | ORAL | 1 refills | Status: DC

## 2018-05-05 MED FILL — DICLOFENAC SODIUM 1% GEL: 1 | 6 days supply | Qty: 100 | Fill #0

## 2018-05-05 MED FILL — DULoxetine HCL 30 MG CPEP: 30 | 30 days supply | Qty: 30 | Fill #0

## 2018-05-05 NOTE — Patient Instructions (Signed)
DASH Eating Plan  DASH stands for "Dietary Approaches to Stop Hypertension." The DASH eating plan is a healthy eating plan that has been shown to reduce high blood pressure (hypertension). It may also reduce your risk for type 2 diabetes, heart disease, and stroke. The DASH eating plan may also help with weight loss.  What are tips for following this plan?    General guidelines   Avoid eating more than 2,300 mg (milligrams) of salt (sodium) a day. If you have hypertension, you may need to reduce your sodium intake to 1,500 mg a day.   Limit alcohol intake to no more than 1 drink a day for nonpregnant women and 2 drinks a day for men. One drink equals 12 oz of beer, 5 oz of wine, or 1 oz of hard liquor.   Work with your health care provider to maintain a healthy body weight or to lose weight. Ask what an ideal weight is for you.   Get at least 30 minutes of exercise that causes your heart to beat faster (aerobic exercise) most days of the week. Activities may include walking, swimming, or biking.   Work with your health care provider or diet and nutrition specialist (dietitian) to adjust your eating plan to your individual calorie needs.  Reading food labels     Check food labels for the amount of sodium per serving. Choose foods with less than 5 percent of the Daily Value of sodium. Generally, foods with less than 300 mg of sodium per serving fit into this eating plan.   To find whole grains, look for the word "whole" as the first word in the ingredient list.  Shopping   Buy products labeled as "low-sodium" or "no salt added."   Buy fresh foods. Avoid canned foods and premade or frozen meals.  Cooking   Avoid adding salt when cooking. Use salt-free seasonings or herbs instead of table salt or sea salt. Check with your health care provider or pharmacist before using salt substitutes.   Do not fry foods. Cook foods using healthy methods such as baking, boiling, grilling, and broiling instead.   Cook with  heart-healthy oils, such as olive, canola, soybean, or sunflower oil.  Meal planning   Eat a balanced diet that includes:  ? 5 or more servings of fruits and vegetables each day. At each meal, try to fill half of your plate with fruits and vegetables.  ? Up to 6-8 servings of whole grains each day.  ? Less than 6 oz of lean meat, poultry, or fish each day. A 3-oz serving of meat is about the same size as a deck of cards. One egg equals 1 oz.  ? 2 servings of low-fat dairy each day.  ? A serving of nuts, seeds, or beans 5 times each week.  ? Heart-healthy fats. Healthy fats called Omega-3 fatty acids are found in foods such as flaxseeds and coldwater fish, like sardines, salmon, and mackerel.   Limit how much you eat of the following:  ? Canned or prepackaged foods.  ? Food that is high in trans fat, such as fried foods.  ? Food that is high in saturated fat, such as fatty meat.  ? Sweets, desserts, sugary drinks, and other foods with added sugar.  ? Full-fat dairy products.   Do not salt foods before eating.   Try to eat at least 2 vegetarian meals each week.   Eat more home-cooked food and less restaurant, buffet, and fast food.     When eating at a restaurant, ask that your food be prepared with less salt or no salt, if possible.  What foods are recommended?  The items listed may not be a complete list. Talk with your dietitian about what dietary choices are best for you.  Grains  Whole-grain or whole-wheat bread. Whole-grain or whole-wheat pasta. Brown rice. Oatmeal. Quinoa. Bulgur. Whole-grain and low-sodium cereals. Pita bread. Low-fat, low-sodium crackers. Whole-wheat flour tortillas.  Vegetables  Fresh or frozen vegetables (raw, steamed, roasted, or grilled). Low-sodium or reduced-sodium tomato and vegetable juice. Low-sodium or reduced-sodium tomato sauce and tomato paste. Low-sodium or reduced-sodium canned vegetables.  Fruits  All fresh, dried, or frozen fruit. Canned fruit in natural juice (without  added sugar).  Meat and other protein foods  Skinless chicken or turkey. Ground chicken or turkey. Pork with fat trimmed off. Fish and seafood. Egg whites. Dried beans, peas, or lentils. Unsalted nuts, nut butters, and seeds. Unsalted canned beans. Lean cuts of beef with fat trimmed off. Low-sodium, lean deli meat.  Dairy  Low-fat (1%) or fat-free (skim) milk. Fat-free, low-fat, or reduced-fat cheeses. Nonfat, low-sodium ricotta or cottage cheese. Low-fat or nonfat yogurt. Low-fat, low-sodium cheese.  Fats and oils  Soft margarine without trans fats. Vegetable oil. Low-fat, reduced-fat, or light mayonnaise and salad dressings (reduced-sodium). Canola, safflower, olive, soybean, and sunflower oils. Avocado.  Seasoning and other foods  Herbs. Spices. Seasoning mixes without salt. Unsalted popcorn and pretzels. Fat-free sweets.  What foods are not recommended?  The items listed may not be a complete list. Talk with your dietitian about what dietary choices are best for you.  Grains  Baked goods made with fat, such as croissants, muffins, or some breads. Dry pasta or rice meal packs.  Vegetables  Creamed or fried vegetables. Vegetables in a cheese sauce. Regular canned vegetables (not low-sodium or reduced-sodium). Regular canned tomato sauce and paste (not low-sodium or reduced-sodium). Regular tomato and vegetable juice (not low-sodium or reduced-sodium). Pickles. Olives.  Fruits  Canned fruit in a light or heavy syrup. Fried fruit. Fruit in cream or butter sauce.  Meat and other protein foods  Fatty cuts of meat. Ribs. Fried meat. Bacon. Sausage. Bologna and other processed lunch meats. Salami. Fatback. Hotdogs. Bratwurst. Salted nuts and seeds. Canned beans with added salt. Canned or smoked fish. Whole eggs or egg yolks. Chicken or turkey with skin.  Dairy  Whole or 2% milk, cream, and half-and-half. Whole or full-fat cream cheese. Whole-fat or sweetened yogurt. Full-fat cheese. Nondairy creamers. Whipped toppings.  Processed cheese and cheese spreads.  Fats and oils  Butter. Stick margarine. Lard. Shortening. Ghee. Bacon fat. Tropical oils, such as coconut, palm kernel, or palm oil.  Seasoning and other foods  Salted popcorn and pretzels. Onion salt, garlic salt, seasoned salt, table salt, and sea salt. Worcestershire sauce. Tartar sauce. Barbecue sauce. Teriyaki sauce. Soy sauce, including reduced-sodium. Steak sauce. Canned and packaged gravies. Fish sauce. Oyster sauce. Cocktail sauce. Horseradish that you find on the shelf. Ketchup. Mustard. Meat flavorings and tenderizers. Bouillon cubes. Hot sauce and Tabasco sauce. Premade or packaged marinades. Premade or packaged taco seasonings. Relishes. Regular salad dressings.  Where to find more information:   National Heart, Lung, and Blood Institute: www.nhlbi.nih.gov   American Heart Association: www.heart.org  Summary   The DASH eating plan is a healthy eating plan that has been shown to reduce high blood pressure (hypertension). It may also reduce your risk for type 2 diabetes, heart disease, and stroke.   With the   DASH eating plan, you should limit salt (sodium) intake to 2,300 mg a day. If you have hypertension, you may need to reduce your sodium intake to 1,500 mg a day.   When on the DASH eating plan, aim to eat more fresh fruits and vegetables, whole grains, lean proteins, low-fat dairy, and heart-healthy fats.   Work with your health care provider or diet and nutrition specialist (dietitian) to adjust your eating plan to your individual calorie needs.  This information is not intended to replace advice given to you by your health care provider. Make sure you discuss any questions you have with your health care provider.  Document Released: 02/15/2011 Document Revised: 02/20/2016 Document Reviewed: 02/20/2016  Elsevier Interactive Patient Education  2019 [employer] Inc[employer]

## 2018-05-05 NOTE — Progress Notes (Signed)
Subjective:    Patient ID: Jenna [name], female    DOB: April 29, 1970, 48 y.o.   MRN: [id number]  HPI       48 yo female last seen in the office on 12/31/17 for low back pain with radiation, bilateral chronic knee pain,  obesity and anemia.  At today's visit, patient with continued chronic bilateral knee pain and she feels that her knee pain is worsening.  In the past, patient states that her pain was greatest on the right but now the left knee is starting to hurt as bad as the right knee.  Patient states that the knee pain is constant and ranges from a 7-8 on a 0-10 scale.  Pain is made worse with activity such as walking and when patient is walking this causes her to also have increased low back pain and radiation of pain from her right lower back down to just above the knee.  Patient's knee pain is sharp and throbbing.  At other times, she has a dull, aching sensation in her knees.  Naproxen slightly helps.  Patient states that when she takes Flexeril she gets a funny sensation in her legs.      Patient continues to experience fatigue.  Patient denies any unusual bruising or bleeding.  Patient denies chest pain or shortness of breath with ambulation.  Patient does have history of anemia.  Patient denies abdominal pain, no blood in the stool and no dark stools.  Patient also feels as if she is more irritable recently and feels that she has a decreased mood.  Patient reports that she has not worked in the past 2 years secondary to her knee and back pain.  Patient states that she was told in the past that she needed a knee replacement however she has not been able to afford this and patient states that she has still been unable to complete application for financial assistance.      Patient now also with some neck pain in addition to the low back pain with radiation.  Patient states that she has dull, aching pain in her neck just above the shoulders as well as in the low back and right lower back.  Patient reports that she was diagnosed in the past with high blood pressure but never took the medication that was prescribed as she states that when she would have her blood pressure rechecked it was always within normal.  Patient does not feel that she is having any headaches or dizziness at this time which could be related to blood pressure.   Past Medical History:  Diagnosis Date  . Arthritis    Past Surgical History:  Procedure Laterality Date  . CHOLECYSTECTOMY    . TUBAL LIGATION     Family History  Problem Relation Age of Onset  . Diabetes Mother   . Hypertension Mother    Social History   Tobacco Use  . Smoking status: Current Every Day Smoker    Packs/day: 0.10    Types: Cigarettes  . Smokeless tobacco: Never Used  Substance Use Topics  . Alcohol use: No    Alcohol/week: 0.0 standard drinks  . Drug use: No   No Known Allergies  Current Outpatient Medications on File Prior to Visit  Medication Sig Dispense Refill  . cyclobenzaprine (FLEXERIL) 10 MG tablet Take 1 tablet (10 mg total) by mouth at bedtime. As needed for muscle spasm 30 tablet 0  . naproxen (NAPROSYN) 500 MG tablet Take 1 tablet (500  mg total) by mouth 2 (two) times daily with a meal. Take after eating as needed for pain 60 tablet 3  . predniSONE (DELTASONE) 20 MG tablet 2 pills once daily for two days then 1 pill daily for 2 days then 1/2 pill daily for 4 days; take after eating as needed for back pain with radiation 8 tablet 1   No current facility-administered medications on file prior to visit.       Review of Systems     Objective:   Physical Exam BP (!) 148/80 (BP Location: Left Arm, Patient Position: Sitting, Cuff Size: Large)   Pulse (!) 113   Temp 98.3 F (36.8 C) (Oral)   Resp 18   Ht 5\' 4"  (1.626 m)   Wt 300 lb (136.1 kg)   LMP 04/16/2018   SpO2 99%   BMI 51.49 kg/m Nurse's notes and vital signs reviewed General- well-nourished, well-developed obese female in no acute distress  but patient does appear to be fatigued versus flattened affect Neck-supple, no lymphadenopathy Lungs-clear to auscultation bilaterally, breathing is unlabored Cardiovascular-regular rate and rhythm Abdomen- truncal obesity, soft and nontender Back- no CVA tenderness, patient with lumbosacral paraspinous spasm and right SI joint tenderness with positive seated right leg raise Musculoskeletal- patient with medial joint line tenderness of the right knee and patient with tenderness over the mid and lower left patella and left lateral joint knee joint line tenderness Extremities- mild, nonpitting puffiness of the ankles but no pitting edema Psych-patient with slightly flattened affect but otherwise interacts appropriately       Assessment & Plan:  1. Primary osteoarthritis of both knees Patient's orthopedic notes from 2018 with Dr. Rayburn Ma reviewed.  Patient unfortunately has end-stage osteoarthritis of the knees right greater than left as she has failed treatment with steroid injections and Visco supplementation.  Patient was encouraged to make sure that she gets application completed for financial assistance as patient would hopefully have improvement in chronic pain status post knee replacement surgery.  Patient is to call for orthopedic referral when she finds out if she is qualified for financial assistance but patient will also be seen here in the office in 4 weeks for additional follow-up.  Prescription provided for patient to try Voltaren gel to see if this will also help with her knee pain - diclofenac sodium (VOLTAREN) 1 % GEL; Apply 4 g topically 4 (four) times daily. To knees and SI joint  Dispense: 2 Tube; Refill: 6  2. Bilateral chronic knee pain Patient with chronic pain in both knees as well as her lower back.  Patient's low back pain may be partially related to abnormal gait due to chronic knee pain from osteoarthritis.  Prescription for Voltaren gel provided to the patient -  diclofenac sodium (VOLTAREN) 1 % GEL; Apply 4 g topically 4 (four) times daily. To knees and SI joint  Dispense: 2 Tube; Refill: 6  3. Normocytic anemia Patient with normocytic anemia and at follow-up, patient will have BMP and CBC done in follow-up of her anemia as well as new start of medication/use of nonsteroidal anti-inflammatory medication for pain  4. Low back pain with radiation Patient with chronic low back pain with radiation and patient agrees to try Cymbalta to see if this will help with her back pain.  Patient given prescription for Cymbalta 30 mg once daily and discussed with patient that this may also help with her depressed mood.  Patient may continue pain medication.  Patient had complaint of abnormal/funny feeling  in her legs when taking Flexeril therefore this will be discontinued and prescription will be sent in for patient to try Robaxin as needed for muscle spasm. - DULoxetine (CYMBALTA) 30 MG capsule; Take 1 capsule (30 mg total) by mouth daily. For back pain and mood  Dispense: 30 capsule; Refill: 1  5. Depressed mood Patient admits to a depressed mood secondary to chronic health conditions and financial stressors.  Referral placed for social work consult.  Patient was agreeable to start the use of Cymbalta 30 mg once daily to help with back pain and depression.  Patient will be seen in follow-up in approximately 4 weeks and at that time dose can be increased to 60 mg daily if this has been helpful.  Patient should call or return sooner if she has any difficulty with the medication, increased anxiety and depression or any concerns.  If patient has onset of suicidal thoughts or ideations, patient should seek emergency department follow-up - Ambulatory referral to Social Work - DULoxetine (CYMBALTA) 30 MG capsule; Take 1 capsule (30 mg total) by mouth daily. For back pain and mood  Dispense: 30 capsule; Refill: 1  6. Elevated blood pressure reading Information provided on DASH  diet and patient will have follow-up visit for repeat blood pressure.  Patient reports past diagnosis of hypertension but did not take prescribed medication and blood pressure normalized.  Discussed the importance of weight loss.  Patient is unable to engage currently in meaningful exercise or meaningful employment due to her chronic medical conditions  An After Visit Summary was printed and given to the patient.  Return in about 4 weeks (around 06/02/2018) for pain/BP and new medication.

## 2018-06-02 ENCOUNTER — Ambulatory Visit: Payer: Self-pay | Admitting: Family Medicine

## 2018-07-23 ENCOUNTER — Encounter: Payer: Self-pay | Admitting: Family Medicine

## 2018-07-23 ENCOUNTER — Ambulatory Visit: Payer: Self-pay | Attending: Family Medicine | Admitting: Family Medicine

## 2018-07-23 ENCOUNTER — Other Ambulatory Visit: Payer: Self-pay

## 2018-07-23 DIAGNOSIS — G8929 Other chronic pain: Secondary | ICD-10-CM

## 2018-07-23 DIAGNOSIS — R4589 Other symptoms and signs involving emotional state: Secondary | ICD-10-CM

## 2018-07-23 DIAGNOSIS — M25561 Pain in right knee: Secondary | ICD-10-CM

## 2018-07-23 DIAGNOSIS — M545 Low back pain, unspecified: Secondary | ICD-10-CM

## 2018-07-23 DIAGNOSIS — M170 Bilateral primary osteoarthritis of knee: Secondary | ICD-10-CM

## 2018-07-23 DIAGNOSIS — M25562 Pain in left knee: Secondary | ICD-10-CM

## 2018-07-23 DIAGNOSIS — F329 Major depressive disorder, single episode, unspecified: Secondary | ICD-10-CM

## 2018-07-23 DIAGNOSIS — M5440 Lumbago with sciatica, unspecified side: Secondary | ICD-10-CM

## 2018-07-23 NOTE — Progress Notes (Signed)
Virtual Visit via Telephone Note  I connected with Jenna [name] on 07/23/18 at  2:10 PM EDT by telephone and verified that I am speaking with the correct person using two identifiers.   I discussed the limitations, risks, security and privacy concerns of performing an evaluation and management service by telephone and the availability of in person appointments. I also discussed with the patient that there may be a patient responsible charge related to this service. The patient expressed understanding and agreed to proceed.  Patient Location: Home  Provider Location: Office Others participating in call: Guillermina [place], RMA   History of Present Illness:       48 yo female with medical issues including chronic joint pain due to osteoarthritis, back pain and depressed mood who complains of worsening lower back and knee pain. Back pain is sharp and is about a 7 on a 0-10 scale. No radiation of back pain at this time but has had back pain into her buttocks and legs in the past.  Back tends to feel tight and interferes with her sleep. Her knee pain is also sharp as well as aching and is a 8-9 on a 0-10 scale. Pain is worse when she initially gets up after being seated or going up/down stairs. Left knee hurts more than the right. She has not noticed any swelling in her knees.   She does not recall any inciting event that has led to worsening of her pain. She is taking Aleve daily and using a prescribed arthritis cream. Denies stomach upset/abdominal pain or blood in the stool/melena. She has had joint injections and has done physical therapy.       She does think that she has had an increase in her depression/anxiety due to concerns regarding the recent COVID pandemic. She is taking her duloxetine daily. She denies any suicidal thoughts or ideations. She believes that she may be anxious.    Past Medical History:  Diagnosis Date  . Arthritis     Past Surgical History:  Procedure Laterality Date   . CHOLECYSTECTOMY    . TUBAL LIGATION      Family History  Problem Relation Age of Onset  . Diabetes Mother   . Hypertension Mother     Social History   Tobacco Use  . Smoking status: Current Every Day Smoker    Packs/day: 0.10    Types: Cigarettes  . Smokeless tobacco: Never Used  Substance Use Topics  . Alcohol use: No    Alcohol/week: 0.0 standard drinks  . Drug use: No     No Known Allergies     Observations/Objective: No vital signs or physical exam conducted as visit was done via telephone  Assessment and Plan: 1. Bilateral chronic knee pain Patient with known osteoarthritis of her knees in addition to obesity.  She is currently taking naproxen and using Voltaren cream.  She has had prior steroid injections as well as Visco supplementation of the knees without any long-term improvement in her knee pain.  She also reports that she recently had physical therapy.  She does not recall if the knee replacement was mentioned in the past but it sounds as if this may be the next step to help with her chronic pain.  She will be referred back to orthopedics for further evaluation and treatment.  2. Depressed mood Patient will be referred to social work here at this office as well as referral to psychology in follow-up of her depressed mood.  She will  also continue the use of duloxetine and dose will be increased to 60 mg. - Ambulatory referral to Social Work - Ambulatory referral to Psychology  3. Primary osteoarthritis of both knees Patient with bilateral osteoarthritis of the knees and reports no long-term benefit/improvement of her pain with prior steroid injections, Visco supplementation or physical therapy.  She is encouraged to try and maintain a healthy weight/lose weight and is being referred back to orthopedics.  4. Chronic midline low back pain without sciatica Prescription provided for Robaxin to help with muscle spasms in the back and hopefully this may also help with  sleep.  Warm moist heat and continue use her nonsteroidal anti-inflammatories encouraged.  Patient should make sure that she has something to eat prior to taking nonsteroidal anti-inflammatories to avoid GI upset.   Follow Up Instructions:Return in about 4 weeks (around 08/20/2018) for mood/pain.   I discussed the assessment and treatment plan with the patient. The patient was provided an opportunity to ask questions and all were answered. The patient agreed with the plan and demonstrated an understanding of the instructions.   The patient was advised to call back or seek an in-person evaluation if the symptoms worsen or if the condition fails to improve as anticipated.  I provided 8 minutes of non-face-to-face time during this encounter.   Cain Saupeammie Lihanna Biever, MD

## 2018-07-23 NOTE — Progress Notes (Signed)
Medication refills patient would like ref for therapist

## 2018-07-27 ENCOUNTER — Encounter: Payer: Self-pay | Admitting: Family Medicine

## 2018-07-27 MED ORDER — METHOCARBAMOL 500 MG PO TABS: 500.0000 mg | ORAL_TABLET | Freq: Three times a day (TID) | ORAL | 3 refills | Status: DC

## 2018-07-27 MED ORDER — DULOXETINE HCL 60 MG PO CPEP: ORAL_CAPSULE | ORAL | 1 refills | Status: DC

## 2018-08-13 ENCOUNTER — Ambulatory Visit: Payer: Self-pay | Admitting: Orthopaedic Surgery

## 2018-08-13 ENCOUNTER — Ambulatory Visit (INDEPENDENT_AMBULATORY_CARE_PROVIDER_SITE_OTHER): Payer: Self-pay

## 2018-08-13 ENCOUNTER — Other Ambulatory Visit: Payer: Self-pay

## 2018-08-13 VITALS — Ht 64.0 in | Wt 280.0 lb

## 2018-08-13 DIAGNOSIS — M1712 Unilateral primary osteoarthritis, left knee: Secondary | ICD-10-CM

## 2018-08-13 DIAGNOSIS — M25562 Pain in left knee: Secondary | ICD-10-CM

## 2018-08-13 DIAGNOSIS — G8929 Other chronic pain: Secondary | ICD-10-CM

## 2018-08-13 NOTE — Progress Notes (Signed)
Office Visit Note   Patient: Jenna GildingBronnita Cabacungan           Date of Birth: 1970-12-12           MRN: [id number] Visit Date: 08/13/2018              Requested by: Cain SaupeFulp, Cammie, MD [address] [place], [place] [id number] PCP: Cain SaupeFulp, Cammie, MD   Assessment & Plan: Visit Diagnoses:  1. Chronic pain of left knee   2. Unilateral primary osteoarthritis, left knee     Plan: I do feel that she does have severe tricompartmental arthritis of her left knee and would benefit from a knee replacement.  However she understands with her BMI being 48 that we are hesitant to proceed without further weight loss.  Examining her knee does show that she needs the replacement as well as her x-ray findings.  I would feel comfortable doing the surgery based on her soft tissue envelope around her knee but she understands that we need document further weight loss.  All question concerns were answered addressed.  She understands that I would like to see her back in 3 months for repeat weight but no x-rays are needed.  We will refer her to a weight loss bariatric specialist with Dr. Dalbert [facility]ley [facility] to see if she can assist with helping with weight loss.  Follow-Up Instructions: Return in about 3 months (around 11/13/2018).   Orders:  Orders Placed This Encounter  Procedures  . XR Knee 1-2 Views Left   No orders of the defined types were placed in this encounter.     Procedures: No procedures performed   Clinical Data: No additional findings.   Subjective: Chief Complaint  Patient presents with  . Left Knee - Pain  . Right Knee - Pain  The patient is a very pleasant 48 year old female with known severe arthritis in her left knee and moderate arthritis in her right knee.  She is someone who is morbidly obese with a BMI of 48.  She weighs 280 pounds.  She used to weigh 320 pounds.  She is not diabetic but is a smoker.  She has had multiple steroid injections in her knees over time as well as gel  injections.  At this point she does wish to be considered for a left total knee arthroplasty.  Her left knee pain is 10 out of 10.  It is daily.  She walks with a limp.  Her left knee pain is detriment affecting her mobility, her quality of life and her actives daily living.  She is working on weight loss still.  She is working on activity modification and does take anti-inflammatories.  This is been going on for multiple years now.  She has had conservative treatment for multiple years as well.  HPI  Review of Systems She currently denies any headache, chest pain, shortness of breath, fever, chills, nausea, vomiting  Objective: Vital Signs: Ht 5\' 4"  (1.626 m)   Wt 280 lb (127 kg)   LMP 07/19/2018 (Exact Date)   BMI 48.06 kg/m   Physical Exam She is alert and orient x3 and in no acute distress but obvious discomfort.  She does walk with a limp favoring her left knee. Ortho Exam Examination of her left knee shows that is slightly hyperextends.  She has varus malalignment on exam with good range of motion.  The knee feels loosely stable but is showing severe crepitation.  There is significant global tenderness  with medial lateral joint lines. Specialty Comments:  No specialty comments available.  Imaging: Xr Knee 1-2 Views Left  Result Date: 08/13/2018 2 views of the left knee show severe end-stage arthritis.  There is complete loss of the lateral joint space.  There is varus malalignment.  There are large particular osteophytes in all 3 compartments.  There is severe patellofemoral disease.    PMFS History: Patient Active Problem List   Diagnosis Date Noted  . Morbid obesity (HCC) 01/17/2017  . Low back pain 05/17/2016  . Primary osteoarthritis of both knees 06/27/2015  . Closed right fibular fracture 08/10/2014   Past Medical History:  Diagnosis Date  . Arthritis     Family History  Problem Relation Age of Onset  . Diabetes Mother   . Hypertension Mother     Past Surgical  History:  Procedure Laterality Date  . CHOLECYSTECTOMY    . TUBAL LIGATION     Social History   Occupational History  . Not on file  Tobacco Use  . Smoking status: Current Every Day Smoker    Packs/day: 0.10    Types: Cigarettes  . Smokeless tobacco: Never Used  Substance and Sexual Activity  . Alcohol use: No    Alcohol/week: 0.0 standard drinks  . Drug use: No  . Sexual activity: Not on file

## 2018-08-14 ENCOUNTER — Other Ambulatory Visit: Payer: Self-pay

## 2018-08-14 DIAGNOSIS — E669 Obesity, unspecified: Secondary | ICD-10-CM

## 2018-08-25 ENCOUNTER — Ambulatory Visit (INDEPENDENT_AMBULATORY_CARE_PROVIDER_SITE_OTHER): Payer: Self-pay | Admitting: Psychology

## 2018-08-25 DIAGNOSIS — F4321 Adjustment disorder with depressed mood: Secondary | ICD-10-CM

## 2018-09-08 ENCOUNTER — Ambulatory Visit (INDEPENDENT_AMBULATORY_CARE_PROVIDER_SITE_OTHER): Payer: Self-pay | Admitting: Psychology

## 2018-09-08 DIAGNOSIS — F4321 Adjustment disorder with depressed mood: Secondary | ICD-10-CM

## 2018-09-15 MED FILL — METHOCARBAMOL 500 MG TABS: 500 | 30 days supply | Qty: 90 | Fill #0

## 2018-09-15 MED FILL — DULoxetine HCL 60 MG CPEP: 60 | 30 days supply | Qty: 30 | Fill #0

## 2018-09-22 ENCOUNTER — Ambulatory Visit (INDEPENDENT_AMBULATORY_CARE_PROVIDER_SITE_OTHER): Payer: Self-pay | Admitting: Psychology

## 2018-09-22 DIAGNOSIS — F4321 Adjustment disorder with depressed mood: Secondary | ICD-10-CM

## 2018-09-23 ENCOUNTER — Telehealth: Payer: Self-pay | Admitting: Licensed Clinical Social Worker

## 2018-09-23 NOTE — Telephone Encounter (Signed)
Call placed to patient to follow up on consult to address behavioral health and/or resource need. LCSW left message to return call.

## 2018-09-24 ENCOUNTER — Telehealth: Payer: Self-pay | Admitting: Licensed Clinical Social Worker

## 2018-09-24 NOTE — Telephone Encounter (Signed)
Call placed to patient. LCSW introduced self and explained role at [facility]. Pt was informed of consult from PCP to address behavioral health and/or resource needs.   Pt shared that she has hx of depression triggered by chronic pain. She is currently participating in medication management and psychotherapy with Dr. Dennison Bulla with [facility].   Pt is awaiting results from disability hearing from April 2020. She is interested in re-applying for [employer]. LCSW informed pt of the process and mailed application to address on file, which was verified by patient. No additional concerns noted.

## 2018-09-30 ENCOUNTER — Ambulatory Visit (INDEPENDENT_AMBULATORY_CARE_PROVIDER_SITE_OTHER): Payer: Self-pay | Admitting: Psychology

## 2018-09-30 DIAGNOSIS — F4321 Adjustment disorder with depressed mood: Secondary | ICD-10-CM

## 2018-10-09 ENCOUNTER — Ambulatory Visit: Payer: Self-pay | Admitting: Psychology

## 2018-10-17 ENCOUNTER — Ambulatory Visit (INDEPENDENT_AMBULATORY_CARE_PROVIDER_SITE_OTHER): Payer: Self-pay | Admitting: Psychology

## 2018-10-17 DIAGNOSIS — F4321 Adjustment disorder with depressed mood: Secondary | ICD-10-CM

## 2018-10-27 ENCOUNTER — Ambulatory Visit (INDEPENDENT_AMBULATORY_CARE_PROVIDER_SITE_OTHER): Payer: Self-pay | Admitting: Psychology

## 2018-10-27 DIAGNOSIS — F4321 Adjustment disorder with depressed mood: Secondary | ICD-10-CM

## 2018-11-03 ENCOUNTER — Ambulatory Visit: Payer: Self-pay | Admitting: Psychology

## 2018-11-07 MED FILL — NAPROXEN 500 MG TABLET: 500 | 30 days supply | Qty: 60 | Fill #1

## 2018-11-07 MED FILL — DULoxetine HCL 60 MG CPEP: 60 | 30 days supply | Qty: 30 | Fill #1

## 2018-11-18 ENCOUNTER — Other Ambulatory Visit: Payer: Self-pay

## 2018-11-18 ENCOUNTER — Encounter: Payer: Self-pay | Admitting: Orthopaedic Surgery

## 2018-11-18 ENCOUNTER — Ambulatory Visit (INDEPENDENT_AMBULATORY_CARE_PROVIDER_SITE_OTHER): Payer: Self-pay | Admitting: Orthopaedic Surgery

## 2018-11-18 VITALS — Wt 282.0 lb

## 2018-11-18 DIAGNOSIS — M1712 Unilateral primary osteoarthritis, left knee: Secondary | ICD-10-CM

## 2018-11-18 NOTE — Progress Notes (Signed)
HPI: Jenna [name] returns today next week for weight check.  She has been trying to lose weight on her own.  Unfortunately Dr. Migdalia Dk office did not contact her because they are backlogged with referrals.  She has known severe left knee osteoarthritis and moderate right knee osteoarthritis.  Weight: 283 pounds Calculate BMI :48.41  Plan: We will call Dr. Migdalia Dk office they will be contacting her shortly if she is not heard from them in the next 2 weeks she will contact our office and we will try to expedite her referral.  She will follow-up with [place] in 3 months check her weight.  Did go go over with her that she needs to weigh approximately 230 pounds to have a BMI of 39.5.  Discussed knee friendly exercises with her.  Questions were encouraged and answered.  No charge for today's office visit.

## 2018-11-21 ENCOUNTER — Other Ambulatory Visit: Payer: Self-pay

## 2018-11-21 ENCOUNTER — Ambulatory Visit: Payer: Self-pay | Attending: Family Medicine

## 2018-11-21 MED FILL — METHOCARBAMOL 500 MG TABS: 500 | 30 days supply | Qty: 90 | Fill #1

## 2018-11-21 MED FILL — DULoxetine HCL 60 MG CPEP: 60 | 30 days supply | Qty: 30 | Fill #1

## 2018-12-04 ENCOUNTER — Telehealth: Payer: Self-pay | Admitting: Family Medicine

## 2018-12-04 NOTE — Telephone Encounter (Signed)
Pt was sent a letter from financial dept. Inform them, that the application they submitted was incomplete, since they were missing some documentation at the time of the appointment, Pt need to reschedule and resubmit all new papers and application for [employer] and [employer], P.S. old documents has been sent back by mail to the Pt and Pt. need to make a new appt. 

## 2018-12-08 ENCOUNTER — Other Ambulatory Visit: Payer: Self-pay

## 2018-12-08 ENCOUNTER — Encounter: Payer: Self-pay | Admitting: Bariatrics

## 2018-12-08 ENCOUNTER — Encounter (INDEPENDENT_AMBULATORY_CARE_PROVIDER_SITE_OTHER): Payer: Self-pay | Admitting: Bariatrics

## 2018-12-08 ENCOUNTER — Ambulatory Visit (INDEPENDENT_AMBULATORY_CARE_PROVIDER_SITE_OTHER): Payer: Self-pay | Admitting: Bariatrics

## 2018-12-08 VITALS — BP 141/83 | HR 90 | Temp 98.7°F | Ht 65.0 in | Wt 277.0 lb

## 2018-12-08 DIAGNOSIS — Z0289 Encounter for other administrative examinations: Secondary | ICD-10-CM

## 2018-12-08 DIAGNOSIS — M545 Low back pain, unspecified: Secondary | ICD-10-CM

## 2018-12-08 DIAGNOSIS — E559 Vitamin D deficiency, unspecified: Secondary | ICD-10-CM

## 2018-12-08 DIAGNOSIS — M170 Bilateral primary osteoarthritis of knee: Secondary | ICD-10-CM

## 2018-12-08 DIAGNOSIS — R0602 Shortness of breath: Secondary | ICD-10-CM

## 2018-12-08 DIAGNOSIS — Z9189 Other specified personal risk factors, not elsewhere classified: Secondary | ICD-10-CM

## 2018-12-08 DIAGNOSIS — R5383 Other fatigue: Secondary | ICD-10-CM

## 2018-12-08 DIAGNOSIS — F3289 Other specified depressive episodes: Secondary | ICD-10-CM

## 2018-12-08 DIAGNOSIS — Z6841 Body Mass Index (BMI) 40.0 and over, adult: Secondary | ICD-10-CM

## 2018-12-08 NOTE — Progress Notes (Signed)
.  Office: [phone]  /  Fax: [phone]   HPI:   Chief Complaint: OBESITY  Jenna [name] (MR# [id number]) is a 48 y.o. female who presents on 12/08/2018 for obesity evaluation and treatment. Current BMI is Body mass index is 46.1 kg/m.Jenna [name] Jenna [name] has struggled with obesity for years and has been unsuccessful in either losing weight or maintaining long term weight loss. [name] attended our information session and states she is currently in the action stage of change and ready to dedicate time achieving and maintaining a healthier weight.   Jenna [name] considers her worst habits to be bread, potatoes, rice, and pasta. She dislikes lima beans, cottage cheese, beets, and eggplant. She skips breakfast most days. Her goal weight is 225 lbs.  Jenna [name] states her family eats meals together she thinks her family will eat healthier with her her desired weight loss is 47-52 lbs she has been heavy most of her life she started gaining weight after having children her heaviest weight ever was 320 lbs. she skips breakfast frequently   Fatigue Jenna [name] feels her energy is lower than it should be. This has worsened with weight gain and has worsened recently. Jenna [name] denies daytime somnolence and  admits to waking up still tired. Patient is at risk for obstructive sleep apnea. She does have morning headaches. Patient generally gets 4-6 hours of sleep per night, and states they generally do not sleep well most nights. Snoring is present. Apneic episodes are not present. Epworth Sleepiness Score is 7.  Dyspnea on exertion Jenna [name] notes increasing shortness of breath with exercising and seems to be worsening over time with weight gain. She notes getting out of breath sooner with activity than she used to. This has gotten worse recently. Jenna [name] denies orthopnea.  Osteoarthritis, both knees Jenna [name] needs to have knee surgery and needs her BMI to be at 40 or below to have surgery performed.  Low Back  Pain Jenna [name] complains of a constant ache and is taking Duloxetine.  Depression with emotional eating behaviors Jenna [name] is struggling with emotional eating and using food for comfort to the extent that it is negatively impacting her health. She often snacks when she is not hungry. Jenna [name] sometimes feels she is out of control and then feels guilty that she made poor food choices. She has been working on behavior modification techniques to help reduce her emotional eating and has been somewhat successful. Jenna [name] denies stress/emotional eating. PHQ-9 score was less than 4. She shows no sign of suicidal or homicidal ideations.  Vitamin D deficiency Jenna [name] has a diagnosis of Vitamin D deficiency. She is currently not taking Vit D and denies nausea, vomiting or muscle weakness.  At risk for osteopenia and osteoporosis Jenna [name] is at higher risk of osteopenia and osteoporosis due to Vitamin D deficiency.   Depression Screen Jenna [name]'s Food and Mood (modified PHQ-9) score was 14. Depression screen [facility] 2/9 12/08/2018  Decreased Interest 3  Down, Depressed, Hopeless 1  PHQ - 2 Score 4  Altered sleeping 3  Tired, decreased energy 2  Change in appetite 3  Feeling bad or failure about yourself  0  Trouble concentrating 0  Moving slowly or fidgety/restless 2  Suicidal thoughts 0  PHQ-9 Score 14  Difficult doing work/chores Very difficult    ASSESSMENT AND PLAN:  Other fatigue - Plan: EKG 12-Lead, T3, T4, free, TSH, Hemoglobin A1c, Insulin, random, Comprehensive metabolic panel  Shortness of breath on exertion - Plan: Lipid Panel With LDL/HDL Ratio  Low back pain, unspecified back  pain laterality, unspecified chronicity, unspecified whether sciatica present  Osteoarthritis of both knees, unspecified osteoarthritis type  Vitamin D deficiency - Plan: VITAMIN D 25 Hydroxy (Vit-D Deficiency, Fractures)  Other depression  At risk for osteoporosis  Class 3 severe obesity with serious  comorbidity and body mass index (BMI) of 45.0 to 49.9 in adult, unspecified obesity type (HCC)  PLAN:  Fatigue Jenna [name] was informed that her fatigue may be related to obesity, depression or many other causes. Labs will be ordered, and in the meanwhile Jenna [name] has agreed to work on diet, exercise and weight loss to help with fatigue. Proper sleep hygiene was discussed including the need for 7-8 hours of quality sleep each night. A sleep study was not ordered based on symptoms and Epworth score.  Dyspnea on exertion Jenna [name]'s shortness of breath appears to be obesity related and exercise induced. She has agreed to work on weight loss and gradually increase exercise to treat her exercise induced shortness of breath. If Jenna [name] follows our instructions and loses weight without improvement of her shortness of breath, we will plan to refer to pulmonology. We will monitor this condition regularly. Jenna [name] agrees to this plan. [name] will gradually increase exercise.  Osteoarthritis, both knees Jenna [name] will work on weight at this time. No pending exercise.  Low Back Pain Jenna [name] will continue medication and follow-up as directed to monitor her progress.  Depression with Emotional Eating Behaviors We discussed CBT techniques. Jenna [name] will follow-up as directed to monitor her progress.  Vitamin D Deficiency Jenna [name] was informed that low Vitamin D levels contributes to fatigue and are associated with obesity, breast, and colon cancer. She agrees to have routine testing of Vitamin D and will follow-up with our clinic in 2 weeks.  At risk for osteopenia and osteoporosis Jenna [name] was given extended  (15 minutes) osteoporosis prevention counseling today. Jenna [name] is at risk for osteopenia and osteoporosis due to her Vitamin D deficiency. She was encouraged to take her Vitamin D and follow her higher calcium diet and increase strengthening exercise to help strengthen her bones and decrease her  risk of osteopenia and osteoporosis.  Depression Screen Jenna [name] had a moderately positive depression screening. Depression is commonly associated with obesity and often results in emotional eating behaviors. We will monitor this closely and work on CBT to help improve the non-hunger eating patterns. Referral to Psychology may be required if no improvement is seen as she continues in our clinic.  Obesity Vandana is currently in the action stage of change and her goal is to continue with weight loss efforts. She has agreed to follow the Category 2 plan. Marisal will not skip meals, will work on meal planning and intentional eating, and will continue to increase her water intake. Sheet given on Seasonings. Hillery has been instructed to work up to a goal of 150 minutes of combined cardio and strengthening exercise per week for weight loss and overall health benefits. We discussed the following Behavioral Modification Strategies today: increasing lean protein intake, decreasing simple carbohydrates, increasing vegetables, increase H20 intake, decrease eating out, no skipping meals, work on meal planning and easy cooking plans, keeping healthy foods in the home, and planning for success.  Mertice has agreed to follow-up with our clinic in 2 weeks. She was informed of the importance of frequent follow-up visits to maximize her success with intensive lifestyle modifications for her multiple health conditions. She was informed we would discuss her lab results at her next visit unless there is a critical issue that  needs to be addressed sooner. Danett agreed to keep her next visit at the agreed upon time to discuss these results.  ALLERGIES: No Known Allergies  MEDICATIONS: Current Outpatient Medications on File Prior to Visit  Medication Sig Dispense Refill  . DULoxetine (CYMBALTA) 60 MG capsule One tablet ( ) once per day for back pain and mood 90 capsule 1  . ferrous sulfate 324 MG TBEC  Take 324 mg by mouth.    . methocarbamol (ROBAXIN) 500 MG tablet Take 1 tablet (500 mg total) by mouth 3 (three) times daily. As needed for muscle spasm 90 tablet 3   No current facility-administered medications on file prior to visit.     PAST MEDICAL HISTORY: Past Medical History:  Diagnosis Date  . Arthritis   . Back pain   . Chest pain   . Depression   . Edema of both lower extremities   . High blood pressure   . Joint pain   . Osteoarthritis     PAST SURGICAL HISTORY: Past Surgical History:  Procedure Laterality Date  . CHOLECYSTECTOMY    . TUBAL LIGATION      SOCIAL HISTORY: Social History   Tobacco Use  . Smoking status: Current Every Day Smoker    Packs/day: 0.10    Types: Cigarettes  . Smokeless tobacco: Never Used  Substance Use Topics  . Alcohol use: No    Alcohol/week: 0.0 standard drinks  . Drug use: No    FAMILY HISTORY: Family History  Problem Relation Age of Onset  . Diabetes Mother   . Hypertension Mother    ROS: Review of Systems  Constitutional: Positive for malaise/fatigue.  Eyes:       Positive for wearing glasses or contacts. Positive for flashes of light. Positive for floaters.  Respiratory: Positive for shortness of breath (with activity).   Cardiovascular: Positive for chest pain (discomfort). Negative for orthopnea.  Gastrointestinal: Negative for nausea and vomiting.  Genitourinary:       Positive for polyuria.  Musculoskeletal: Positive for back pain (low), joint pain and neck pain.       Positive for osteoarthritis in both knees. Negative for muscle weakness. Positive for calf/leg pain with walking.  Skin:       Positive for skin dryness.  Neurological: Positive for headaches.       Positive for leg cramping.  Psychiatric/Behavioral: Positive for depression (emotional eating). Negative for suicidal ideas. The patient has insomnia.        Negative for homicidal ideas.   PHYSICAL EXAM: Blood pressure (!) 141/83, pulse  90, temperature 98.7 F (37.1 C), temperature source Oral, height  (1.651 m), weight 277 lb (125.6 kg), last menstrual period 12/02/2018, SpO2 100 %. Body mass index is 46.1 kg/m. Physical Exam Vitals signs reviewed.  Constitutional:      Appearance: Normal appearance. She is well-developed. She is obese.  HENT:     Head: Normocephalic and atraumatic.     Nose: Nose normal.  Eyes:     General: No scleral icterus. Neck:     Musculoskeletal: Normal range of motion.  Cardiovascular:     Rate and Rhythm: Normal rate and regular rhythm.  Pulmonary:     Effort: Pulmonary effort is normal. No respiratory distress.  Abdominal:     Palpations: Abdomen is soft.     Tenderness: There is no abdominal tenderness.  Musculoskeletal: Normal range of motion.     Comments: Range of motion normal in all four extremities.  Skin:  General: Skin is warm and dry.  Neurological:     Mental Status: She is alert and oriented to person, place, and time.     Coordination: Coordination normal.  Psychiatric:        Mood and Affect: Mood and affect normal.        Behavior: Behavior normal.   RECENT LABS AND TESTS: BMET    Component Value Date/Time   NA 144 12/03/2017 1220   K 4.6 12/03/2017 1220   CL 111 (H) 12/03/2017 1220   CO2 21 12/03/2017 1220   GLUCOSE 83 12/03/2017 1220   BUN 8 12/03/2017 1220   CREATININE 0.82 12/03/2017 1220   CALCIUM 8.7 12/03/2017 1220   GFRNONAA 86 12/03/2017 1220   GFRAA 99 12/03/2017 1220   Lab Results  Component Value Date   HGBA1C 5.6 12/03/2017   No results found for: INSULIN CBC    Component Value Date/Time   WBC 7.0 12/03/2017 1220   RBC 3.91 12/03/2017 1220   HGB 10.8 (L) 12/03/2017 1220   HCT 34.2 12/03/2017 1220   PLT 295 12/03/2017 1220   MCV 88 12/03/2017 1220   MCH 27.6 12/03/2017 1220   MCHC 31.6 12/03/2017 1220   RDW 16.5 (H) 12/03/2017 1220   LYMPHSABS 3.2 (H) 12/03/2017 1220   EOSABS 0.3 12/03/2017 1220   BASOSABS 0.0 12/03/2017  1220   Iron/TIBC/Ferritin/ %Sat No results found for: IRON, TIBC, FERRITIN, IRONPCTSAT Lipid Panel  No results found for: CHOL, TRIG, HDL, CHOLHDL, VLDL, LDLCALC, LDLDIRECT Hepatic Function Panel  No results found for: PROT, ALBUMIN, AST, ALT, ALKPHOS, BILITOT, BILIDIR, IBILI No results found for: TSH  No results found for Vitamin D, 25-Hydroxy  ECG shows sinus rhythm with a rate of 81 BPM. RSR(V1) - nondiagnostic. Probably normal.  INDIRECT CALORIMETER done today shows a VO2 of 228 and a REE of 1589. Her calculated basal metabolic rate is [id number] thus her basal metabolic rate is worse than expected.  OBESITY BEHAVIORAL INTERVENTION VISIT  Today's visit was #1  Starting weight: 277 lbs Starting date: 12/08/2018 Today's weight: 277 lbs Today's date: 12/08/2018 Total lbs lost to date: 0    12/08/2018  Height 5\' 5"  (1.651 m)  Weight 277 lb (125.6 kg)  BMI (Calculated) 46.1  BLOOD PRESSURE - SYSTOLIC 141  BLOOD PRESSURE - DIASTOLIC 83  Waist Measurement  51 inches   Body Fat % 53.7 %  Total Body Water (lbs) 97.4 lbs  RMR 1589   ASK: We discussed the diagnosis of obesity with Leatha GildingBronnita Warda today and Malvika agreed to give us permission to discuss obesity behavioral modification therapy today.  ASSESS: Sampson SiBronnita has the diagnosis of obesity and her BMI today is 46.2. Sampson SiBronnita is in the action stage of change.  ADVISE: Sampson SiBronnita was educated on the multiple health risks of obesity as well as the benefit of weight loss to improve her health. She was advised of the need for long term treatment and the importance of lifestyle modifications to improve her current health and to decrease her risk of future health problems.  AGREE: Multiple dietary modification options and treatment options were discussed and  Clydia agreed to follow the recommendations documented in the above note.  ARRANGE: Sampson SiBronnita was educated on the importance of frequent visits to treat obesity as outlined  per CMS and USPSTF guidelines and agreed to schedule her next follow up appointment today.  Fernanda DrumI, Denise Haag, am acting as Energy managertranscriptionist for [employer] Brown, DO  I have reviewed the above documentation  for accuracy and completeness, and I agree with the above. -Corinna Capra, DO

## 2018-12-09 ENCOUNTER — Encounter (INDEPENDENT_AMBULATORY_CARE_PROVIDER_SITE_OTHER): Payer: Self-pay | Admitting: Bariatrics

## 2018-12-09 DIAGNOSIS — E559 Vitamin D deficiency, unspecified: Secondary | ICD-10-CM | POA: Insufficient documentation

## 2018-12-09 LAB — COMPREHENSIVE METABOLIC PANEL
ALT: 16 IU/L (ref 0–32)
AST: 20 IU/L (ref 0–40)
Albumin/Globulin Ratio: 1.7 (ref 1.2–2.2)
Albumin: 4.2 g/dL (ref 3.8–4.8)
Alkaline Phosphatase: 66 IU/L (ref 39–117)
BUN/Creatinine Ratio: 8 — ABNORMAL LOW (ref 9–23)
BUN: 7 mg/dL (ref 6–24)
Bilirubin Total: 0.4 mg/dL (ref 0.0–1.2)
CO2: 16 mmol/L — ABNORMAL LOW (ref 20–29)
Calcium: 8.9 mg/dL (ref 8.7–10.2)
Chloride: 105 mmol/L (ref 96–106)
Creatinine, Ser: 0.83 mg/dL (ref 0.57–1.00)
GFR calc Af Amer: 97 mL/min/{1.73_m2} (ref >59)
GFR calc non Af Amer: 84 mL/min/{1.73_m2} (ref >59)
Globulin, Total: 2.5 g/dL (ref 1.5–4.5)
Glucose: 86 mg/dL (ref 65–99)
Potassium: 3.8 mmol/L (ref 3.5–5.2)
Sodium: 139 mmol/L (ref 134–144)
Total Protein: 6.7 g/dL (ref 6.0–8.5)

## 2018-12-09 LAB — LIPID PANEL WITH LDL/HDL RATIO
Cholesterol, Total: 143 mg/dL (ref 100–199)
HDL: 47 mg/dL (ref >39)
LDL Chol Calc (NIH): 81 mg/dL (ref 0–99)
LDL/HDL Ratio: 1.7 ratio (ref 0.0–3.2)
Triglycerides: 79 mg/dL (ref 0–149)
VLDL Cholesterol Cal: 15 mg/dL (ref 5–40)

## 2018-12-09 LAB — T3: T3, Total: 103 ng/dL (ref 71–180)

## 2018-12-09 LAB — HEMOGLOBIN A1C
Est. average glucose Bld gHb Est-mCnc: 108 mg/dL
Hgb A1c MFr Bld: 5.4 % (ref 4.8–5.6)

## 2018-12-09 LAB — T4, FREE: Free T4: 1.09 ng/dL (ref 0.82–1.77)

## 2018-12-09 LAB — TSH: TSH: 1.04 u[IU]/mL (ref 0.450–4.500)

## 2018-12-09 LAB — VITAMIN D 25 HYDROXY (VIT D DEFICIENCY, FRACTURES): Vit D, 25-Hydroxy: 6.9 ng/mL — ABNORMAL LOW (ref 30.0–100.0)

## 2018-12-09 LAB — INSULIN, RANDOM: INSULIN: 7.4 u[IU]/mL (ref 2.6–24.9)

## 2018-12-22 ENCOUNTER — Encounter (INDEPENDENT_AMBULATORY_CARE_PROVIDER_SITE_OTHER): Payer: Self-pay | Admitting: Bariatrics

## 2018-12-22 ENCOUNTER — Other Ambulatory Visit: Payer: Self-pay

## 2018-12-22 ENCOUNTER — Ambulatory Visit (INDEPENDENT_AMBULATORY_CARE_PROVIDER_SITE_OTHER): Payer: Self-pay | Admitting: Bariatrics

## 2018-12-22 VITALS — BP 123/83 | HR 75 | Temp 98.5°F | Ht 65.0 in | Wt 265.0 lb

## 2018-12-22 DIAGNOSIS — Z9189 Other specified personal risk factors, not elsewhere classified: Secondary | ICD-10-CM

## 2018-12-22 DIAGNOSIS — E559 Vitamin D deficiency, unspecified: Secondary | ICD-10-CM

## 2018-12-22 DIAGNOSIS — Z6841 Body Mass Index (BMI) 40.0 and over, adult: Secondary | ICD-10-CM

## 2018-12-22 DIAGNOSIS — F3289 Other specified depressive episodes: Secondary | ICD-10-CM

## 2018-12-22 MED ORDER — VITAMIN D (ERGOCALCIFEROL) 1.25 MG (50000 UNIT) PO CAPS: 50000.0000 [IU] | ORAL_CAPSULE | ORAL | 0 refills | Status: DC

## 2018-12-22 MED FILL — VIT D2 1.25 MG (50,000 UNIT: 1.25 MG | 28 days supply | Qty: 4 | Fill #0

## 2018-12-23 NOTE — Progress Notes (Signed)
Office: [phone]  /  Fax: [phone]   HPI:   Chief Complaint: OBESITY Jenna [name] is here to discuss her progress with her obesity treatment plan. She is on the Category 2 plan and is following her eating plan approximately 95 % of the time. She states she is doing calisthenics for 7-10 minutes 4 times per week. Signa is down 12 lbs. She states that the plan has been "easy". She is drinking more water.  Her weight is 265 lb (120.2 kg) today and has had a weight loss of 12 pounds over a period of 2 weeks since her last visit. She has lost 12 lbs since starting treatment with us.  Vitamin D Deficiency Jenna [name] has a diagnosis of vitamin D deficiency. She is not currently taking Vit D and denies nausea, vomiting or muscle weakness.  At risk for osteopenia and osteoporosis Jenna [name] is at higher risk of osteopenia and osteoporosis due to vitamin D deficiency.   Depression with Emotional Eating Behaviors Jenna [name] is struggling with emotional eating and using food for comfort to the extent that it is negatively impacting her health. She often snacks when she is not hungry. Jenna [name] sometimes feels she is out of control and then feels guilty that she made poor food choices. She has been working on behavior modification techniques to help reduce her emotional eating and has been somewhat successful. She shows no sign of suicidal or homicidal ideations.  Dep[facility] screen [facility] 2/9 12/08/2018 07/23/2018 05/05/2018 12/03/2017 06/23/2015  Decreased Interest 3 2 1 2  0  Down, Depressed, Hopeless 1 2 1  0 0  PHQ - 2 Score 4 4 2 2  0  Altered sleeping 3 2 1 3  -  Tired, decreased energy 2 2 1 3  -  Change in appetite 3 2 1 3  -  Feeling bad or failure about yourself  0 1 0 0 -  Trouble concentrating 0 1 1 1  -  Moving slowly or fidgety/restless 2 0 0 0 -  Suicidal thoughts 0 0 0 0 -  PHQ-9 Score 14 12 6 12  -  Difficult doing work/chores Very difficult Very difficult - - -    ASSESSMENT AND PLAN:   Vitamin D deficiency - Plan: Vitamin D, Ergocalciferol, (DRISDOL) 1.25 MG (50000 UT) CAPS capsule  Other depression - with emotional eating   At risk for osteoporosis  Class 3 severe obesity with serious comorbidity and body mass index (BMI) of 40.0 to 44.9 in adult, unspecified obesity type (HCC)  PLAN:  Vitamin D Deficiency Jenna [name] was informed that low vitamin D levels contributes to fatigue and are associated with obesity, breast, and colon cancer. Jenna [name] agrees to start prescription Vit D 50,000 IU every week #4 with no refills. She will follow up for routine testing of vitamin D, at least 2-3 times per year. She was informed of the risk of over-replacement of vitamin D and agrees to not increase her dose unless she discusses this with us first. Jenna [name] agrees to follow up with our clinic in 2 weeks.  At risk for osteopenia and osteoporosis Jenna [name] was given extended (15 minutes) osteoporosis prevention counseling today. Jenna [name] is at risk for osteopenia and osteoporsis due to her vitamin D deficiency. She was encouraged to take her vitamin D and follow her higher calcium diet and increase strengthening exercise to help strengthen her bones and decrease her risk of osteopenia and osteoporosis.  Depression with Emotional Eating Behaviors We discussed cognitive behavioral therapy techniques today to help Jenna [name] deal with her emotional  eating and depression. Jenna [name] agrees to follow up with our clinic in 2 weeks.  Obesity Jenna [name] is currently in the action stage of change. As such, her goal is to continue with weight loss efforts She has agreed to follow the Category 2 plan Jenna [name] has been instructed to work up to a goal of 150 minutes of combined cardio and strengthening exercise per week or continue current exercise and increase over time for weight loss and overall health benefits. She is to put exercise application on her phone We discussed the following Behavioral  Modification Strategies today: increasing lean protein intake, decreasing simple carbohydrates , increasing vegetables, decrease eating out, increase H20 intake, no skipping meals, work on meal planning and easy cooking plans, and keeping healthy foods in the home   Jenna [name] has agreed to follow up with our clinic in 2 weeks. She was informed of the importance of frequent follow up visits to maximize her success with intensive lifestyle modifications for her multiple health conditions.  ALLERGIES: No Known Allergies  MEDICATIONS: Current Outpatient Medications on File Prior to Visit  Medication Sig Dispense Refill  . DULoxetine (CYMBALTA) 60 MG capsule One tablet (60mg ) once per day for back pain and mood 90 capsule 1  . ferrous sulfate 324 MG TBEC Take 324 mg by mouth.    . methocarbamol (ROBAXIN) 500 MG tablet Take 1 tablet (500 mg total) by mouth 3 (three) times daily. As needed for muscle spasm 90 tablet 3   No current facility-administered medications on file prior to visit.     PAST MEDICAL HISTORY: Past Medical History:  Diagnosis Date  . Arthritis   . Back pain   . Chest pain   . Depression   . Edema of both lower extremities   . High blood pressure   . Joint pain   . Osteoarthritis     PAST SURGICAL HISTORY: Past Surgical History:  Procedure Laterality Date  . CHOLECYSTECTOMY    . TUBAL LIGATION      SOCIAL HISTORY: Social History   Tobacco Use  . Smoking status: Current Every Day Smoker    Packs/day: 0.10    Types: Cigarettes  . Smokeless tobacco: Never Used  Substance Use Topics  . Alcohol use: No    Alcohol/week: 0.0 standard drinks  . Drug use: No    FAMILY HISTORY: Family History  Problem Relation Age of Onset  . Diabetes Mother   . Hypertension Mother     ROS: Review of Systems  Constitutional: Positive for weight loss.  Gastrointestinal: Negative for nausea and vomiting.  Musculoskeletal:       Negative muscle weakness   Psychiatric/Behavioral: Positive for depression. Negative for suicidal ideas.    PHYSICAL EXAM: Blood pressure 123/83, pulse 75, temperature 98.5 F (36.9 C), temperature source Oral, height 5\' 5"  (1.651 m), weight 265 lb (120.2 kg), last menstrual period 12/02/2018, SpO2 100 %. Body mass index is 44.1 kg/m. Physical Exam Vitals signs reviewed.  Constitutional:      Appearance: Normal appearance. She is obese.  Cardiovascular:     Rate and Rhythm: Normal rate.     Pulses: Normal pulses.  Pulmonary:     Effort: Pulmonary effort is normal.     Breath sounds: Normal breath sounds.  Musculoskeletal: Normal range of motion.  Skin:    General: Skin is warm and dry.  Neurological:     Mental Status: She is alert and oriented to person, place, and time.  Psychiatric:  Mood and Affect: Mood normal.        Behavior: Behavior normal.     RECENT LABS AND TESTS: BMET    Component Value Date/Time   NA 139 12/08/2018 1055   K 3.8 12/08/2018 1055   CL 105 12/08/2018 1055   CO2 16 (L) 12/08/2018 1055   GLUCOSE 86 12/08/2018 1055   BUN 7 12/08/2018 1055   CREATININE 0.83 12/08/2018 1055   CALCIUM 8.9 12/08/2018 1055   GFRNONAA 84 12/08/2018 1055   GFRAA 97 12/08/2018 1055   Lab Results  Component Value Date   HGBA1C 5.4 12/08/2018   HGBA1C 5.6 12/03/2017   Lab Results  Component Value Date   INSULIN 7.4 12/08/2018   CBC    Component Value Date/Time   WBC 7.0 12/03/2017 1220   RBC 3.91 12/03/2017 1220   HGB 10.8 (L) 12/03/2017 1220   HCT 34.2 12/03/2017 1220   PLT 295 12/03/2017 1220   MCV 88 12/03/2017 1220   MCH 27.6 12/03/2017 1220   MCHC 31.6 12/03/2017 1220   RDW 16.5 (H) 12/03/2017 1220   LYMPHSABS 3.2 (H) 12/03/2017 1220   EOSABS 0.3 12/03/2017 1220   BASOSABS 0.0 12/03/2017 1220   Iron/TIBC/Ferritin/ %Sat No results found for: IRON, TIBC, FERRITIN, IRONPCTSAT Lipid Panel     Component Value Date/Time   CHOL 143 12/08/2018 1055   TRIG 79  12/08/2018 1055   HDL 47 12/08/2018 1055   LDLCALC 81 12/08/2018 1055   Hepatic Function Panel     Component Value Date/Time   PROT 6.7 12/08/2018 1055   ALBUMIN 4.2 12/08/2018 1055   AST 20 12/08/2018 1055   ALT 16 12/08/2018 1055   ALKPHOS 66 12/08/2018 1055   BILITOT 0.4 12/08/2018 1055      Component Value Date/Time   TSH 1.040 12/08/2018 1055      OBESITY BEHAVIORAL INTERVENTION VISIT  Today's visit was # 2   Starting weight: 277 lbs Starting date: 12/08/2018 Today's weight : 265 lbs Today's date: 12/22/2018 Total lbs lost to date: 12    ASK: We discussed the diagnosis of obesity with Jenna [name] today and Jenna [name] agreed to give [place] permission to discuss obesity behavioral modification therapy today.  ASSESS: Mitali has the diagnosis of obesity and her BMI today is 44.1 Jenna [name] is in the action stage of change   ADVISE: Jenna [name] was educated on the multiple health risks of obesity as well as the benefit of weight loss to improve her health. She was advised of the need for long term treatment and the importance of lifestyle modifications to improve her current health and to decrease her risk of future health problems.  AGREE: Multiple dietary modification options and treatment options were discussed and  Jenna [name] agreed to follow the recommendations documented in the above note.  ARRANGE: Jenna [name] was educated on the importance of frequent visits to treat obesity as outlined per CMS and USPSTF guidelines and agreed to schedule her next follow up appointment today.  Jenna [name], am acting as transcriptionist for [employer], DO   I have reviewed the above documentation for accuracy and completeness, and I agree with the above. -Jearld Lesch, DO

## 2018-12-24 ENCOUNTER — Ambulatory Visit: Payer: Self-pay | Attending: Family Medicine

## 2018-12-24 ENCOUNTER — Other Ambulatory Visit: Payer: Self-pay

## 2018-12-25 ENCOUNTER — Encounter (INDEPENDENT_AMBULATORY_CARE_PROVIDER_SITE_OTHER): Payer: Self-pay | Admitting: Bariatrics

## 2019-01-06 ENCOUNTER — Other Ambulatory Visit: Payer: Self-pay

## 2019-01-06 ENCOUNTER — Ambulatory Visit (INDEPENDENT_AMBULATORY_CARE_PROVIDER_SITE_OTHER): Payer: Self-pay | Admitting: Bariatrics

## 2019-01-06 ENCOUNTER — Encounter (INDEPENDENT_AMBULATORY_CARE_PROVIDER_SITE_OTHER): Payer: Self-pay | Admitting: Bariatrics

## 2019-01-06 VITALS — BP 138/79 | HR 92 | Temp 98.7°F | Ht 65.0 in | Wt 263.0 lb

## 2019-01-06 DIAGNOSIS — F3289 Other specified depressive episodes: Secondary | ICD-10-CM

## 2019-01-06 DIAGNOSIS — Z6841 Body Mass Index (BMI) 40.0 and over, adult: Secondary | ICD-10-CM

## 2019-01-06 DIAGNOSIS — Z9189 Other specified personal risk factors, not elsewhere classified: Secondary | ICD-10-CM

## 2019-01-06 DIAGNOSIS — E559 Vitamin D deficiency, unspecified: Secondary | ICD-10-CM

## 2019-01-06 MED ORDER — VITAMIN D (ERGOCALCIFEROL) 1.25 MG (50000 UNIT) PO CAPS: 50000.0000 [IU] | ORAL_CAPSULE | ORAL | 0 refills | Status: DC

## 2019-01-06 NOTE — Progress Notes (Signed)
Office: [phone]  /  Fax: [phone]   HPI:   Chief Complaint: OBESITY Jenna [name] is here to discuss her progress with her obesity treatment plan. Jenna [name] is on the Category 2 plan and is following her eating plan approximately 85% of the time. Jenna [name] states Jenna [name] is doing aerobics 10 minutes 3 times per week. Jenna [name] is down 2 lbs and doing well overall. Jenna [name] does states Jenna [name] is not getting enough protein.  Her weight is 263 lb (119.3 kg) today and has had a weight loss of 2 pounds over a period of 2 weeks since her last visit. Jenna [name] has lost 14 lbs since starting treatment with Jenna [name].  Vitamin D deficiency Jenna [name] has a diagnosis of Vitamin D deficiency. Last Vitamin D 6.9 on 12/08/2018. Jenna [name] is currently taking prescription Vit D and denies nausea, vomiting or muscle weakness.  At risk for osteopenia and osteoporosis Jenna [name] is at higher risk of osteopenia and osteoporosis due to Vitamin D deficiency.   Depression with emotional eating behaviors Jenna [name] is struggling with emotional eating and using food for comfort to the extent that it is negatively impacting her health. Jenna [name] often snacks when Jenna [name] is not hungry. Jenna [name] sometimes feels Jenna [name] is out of control and then feels guilty that Jenna [name] made poor food choices. Jenna [name] has been working on behavior modification techniques to help reduce her emotional eating and has been somewhat successful. Jenna [name] is taking Cymbalta and shows no sign of suicidal or homicidal ideations.  Depression screen Jenna [name] 12/08/2018 07/23/2018 05/05/2018 12/03/2017 06/23/2015  Decreased Interest 3 2 1 2  0  Down, Depressed, Hopeless 1 2 1  0 0  PHQ - 2 Score 4 4 2 2  0  Altered sleeping 3 2 1 3  -  Tired, decreased energy 2 2 1 3  -  Change in appetite 3 2 1 3  -  Feeling bad or failure about yourself  0 1 0 0 -  Trouble concentrating 0 1 1 1  -  Moving slowly or fidgety/restless 2 0 0 0 -  Suicidal thoughts 0 0 0 0 -  PHQ-9 Score 14 12 6 12  -  Difficult doing work/chores Very  difficult Very difficult - - -   ASSESSMENT AND PLAN:  Vitamin D deficiency - Plan: Vitamin D, Ergocalciferol, (DRISDOL) 1.25 MG (50000 UT) CAPS capsule  Other depression - with emotional eating   At risk for osteoporosis  Class 3 severe obesity with serious comorbidity and body mass index (BMI) of 40.0 to 44.9 in adult, unspecified obesity type (HCC)  PLAN:  Vitamin D Deficiency Jenna [name] was informed that low Vitamin D levels contributes to fatigue and are associated with obesity, breast, and colon cancer. Jenna [name] agrees to continue to take prescription Vit D @ 50,000 IU every week #4 with 0 refills and will follow-up for routine testing of Vitamin D, at least 2-3 times per year. Jenna [name] was informed of the risk of over-replacement of Vitamin D and agrees to not increase her dose unless Jenna [name] discusses this with Jenna [name] first. Jenna [name] agrees to follow-up with our clinic in 2 weeks.  At risk for osteopenia and osteoporosis Jenna [name] was given extended  (15 minutes) osteoporosis prevention counseling today. Jenna [name] is at risk for osteopenia and osteoporosis due to her Vitamin D deficiency. Jenna [name] was encouraged to take her Vitamin D and follow her higher calcium diet and increase strengthening exercise to help strengthen her bones and decrease her risk of osteopenia and osteoporosis.  Depression with Emotional Eating Behaviors We discussed behavior modification techniques  today to help Jenna [name] deal with her emotional eating and depression. Jenna [name] will continue her Cymbalta and follow-up with her PCP as directed.  Obesity Jenna [name] is currently in the action stage of change. As such, her goal is to continue with weight loss efforts. Jenna [name] has agreed to follow the Category 2 plan. Jenna [name] will work on meal planning, weigh her meat, protein substitutions, Metamucil thins with a warm drink. Jenna [name] has been instructed to increase her activity for weight loss and overall health benefits. We discussed the  following Behavioral Modification Strategies today: increasing lean protein intake, decreasing simple carbohydrates, increasing vegetables, increase H20 intake, increase high fiber foods, decrease eating out, no skipping meals, work on meal planning and easy cooking plans, keeping healthy foods in the home, and better snacking choices.  Jenna [name] has agreed to follow-up with our clinic in 2 weeks. Jenna [name] was informed of the importance of frequent follow-up visits to maximize her success with intensive lifestyle modifications for her multiple health conditions.  ALLERGIES: No Known Allergies  MEDICATIONS: Current Outpatient Medications on File Prior to Visit  Medication Sig Dispense Refill  . DULoxetine (CYMBALTA) 60 MG capsule One tablet (60mg ) once per day for back pain and mood 90 capsule 1  . ferrous sulfate 324 MG TBEC Take 324 mg by mouth.    . methocarbamol (ROBAXIN) 500 MG tablet Take 1 tablet (500 mg total) by mouth 3 (three) times daily. As needed for muscle spasm 90 tablet 3  . Vitamin D, Ergocalciferol, (DRISDOL) 1.25 MG (50000 UT) CAPS capsule Take 1 capsule (50,000 Units total) by mouth every 7 (seven) days. 4 capsule 0   No current facility-administered medications on file prior to visit.     PAST MEDICAL HISTORY: Past Medical History:  Diagnosis Date  . Arthritis   . Back pain   . Chest pain   . Depression   . Edema of both lower extremities   . High blood pressure   . Joint pain   . Osteoarthritis     PAST SURGICAL HISTORY: Past Surgical History:  Procedure Laterality Date  . CHOLECYSTECTOMY    . TUBAL LIGATION      SOCIAL HISTORY: Social History   Tobacco Use  . Smoking status: Current Every Day Smoker    Packs/day: 0.10    Types: Cigarettes  . Smokeless tobacco: Never Used  Substance Use Topics  . Alcohol use: No    Alcohol/week: 0.0 standard drinks  . Drug use: No    FAMILY HISTORY: Family History  Problem Relation Age of Onset  . Diabetes Mother    . Hypertension Mother    ROS: Review of Systems  Gastrointestinal: Negative for nausea and vomiting.  Musculoskeletal:       Negative for muscle weakness.  Psychiatric/Behavioral: Positive for depression (emotional eating). Negative for suicidal ideas.       Negative for homicidal ideas.   PHYSICAL EXAM: Blood pressure 138/79, pulse 92, temperature 98.7 F (37.1 C), height 5\' 5"  (1.651 m), weight 263 lb (119.3 kg), SpO2 100 %. Body mass index is 43.77 kg/m. Physical Exam Vitals signs reviewed.  Constitutional:      Appearance: Normal appearance. Jenna [name] is obese.  Cardiovascular:     Rate and Rhythm: Normal rate.     Pulses: Normal pulses.  Pulmonary:     Effort: Pulmonary effort is normal.     Breath sounds: Normal breath sounds.  Musculoskeletal: Normal range of motion.  Skin:    General: Skin is warm and dry.  Neurological:     Mental Status: Jenna [name] is alert and oriented to person, place, and time.  Psychiatric:        Behavior: Behavior normal.   RECENT LABS AND TESTS: BMET    Component Value Date/Time   NA 139 12/08/2018 1055   K 3.8 12/08/2018 1055   CL 105 12/08/2018 1055   CO2 16 (L) 12/08/2018 1055   GLUCOSE 86 12/08/2018 1055   BUN 7 12/08/2018 1055   CREATININE 0.83 12/08/2018 1055   CALCIUM 8.9 12/08/2018 1055   GFRNONAA 84 12/08/2018 1055   GFRAA 97 12/08/2018 1055   Lab Results  Component Value Date   HGBA1C 5.4 12/08/2018   HGBA1C 5.6 12/03/2017   Lab Results  Component Value Date   INSULIN 7.4 12/08/2018   CBC    Component Value Date/Time   WBC 7.0 12/03/2017 1220   RBC 3.91 12/03/2017 1220   HGB 10.8 (L) 12/03/2017 1220   HCT 34.2 12/03/2017 1220   PLT 295 12/03/2017 1220   MCV 88 12/03/2017 1220   MCH 27.6 12/03/2017 1220   MCHC 31.6 12/03/2017 1220   RDW 16.5 (H) 12/03/2017 1220   LYMPHSABS 3.2 (H) 12/03/2017 1220   EOSABS 0.3 12/03/2017 1220   BASOSABS 0.0 12/03/2017 1220   Iron/TIBC/Ferritin/ %Sat No results found for: IRON,  TIBC, FERRITIN, IRONPCTSAT Lipid Panel     Component Value Date/Time   CHOL 143 12/08/2018 1055   TRIG 79 12/08/2018 1055   HDL 47 12/08/2018 1055   LDLCALC 81 12/08/2018 1055   Hepatic Function Panel     Component Value Date/Time   PROT 6.7 12/08/2018 1055   ALBUMIN 4.2 12/08/2018 1055   AST 20 12/08/2018 1055   ALT 16 12/08/2018 1055   ALKPHOS 66 12/08/2018 1055   BILITOT 0.4 12/08/2018 1055      Component Value Date/Time   TSH 1.040 12/08/2018 1055   Results for HADLIE, GIPSON (MRN [id number]) as of 01/06/2019 11:00  Ref. Range 12/08/2018 10:55  Vitamin D, 25-Hydroxy Latest Ref Range: 30.0 - 100.0 ng/mL 6.9 (L)   OBESITY BEHAVIORAL INTERVENTION VISIT  Today's visit was #3  Starting weight: 277 lbs Starting date: 12/08/2018 Today's weight: 263 lbs  Today's date: 01/06/2019 Total lbs lost to date: 14    01/06/2019  Height 5\' 5"  (1.651 m)  Weight 263 lb (119.3 kg)  BMI (Calculated) 43.77  BLOOD PRESSURE - SYSTOLIC 138  BLOOD PRESSURE - DIASTOLIC 79   Body Fat % 51 %  Total Body Water (lbs) 91.8 lbs   ASK: We discussed the diagnosis of obesity with today and Aryanna agreed to give [name] permission to discuss obesity behavioral modification therapy today.  ASSESS: Jackelynn has the diagnosis of obesity and her BMI today is 43.8. Anayiah is in the action stage of change.   ADVISE: Kateryn was educated on the multiple health risks of obesity as well as the benefit of weight loss to improve her health. Jenna [name] was advised of the need for long term treatment and the importance of lifestyle modifications to improve her current health and to decrease her risk of future health problems.  AGREE: Multiple dietary modification options and treatment options were discussed and  Ragen agreed to follow the recommendations documented in the above note.  ARRANGE: Anju was educated on the importance of frequent visits to treat obesity as outlined per CMS and  USPSTF guidelines and agreed to schedule her next follow up appointment today.  Jenna [name], am  acting as Location manager for [employer], DO  I have reviewed the above documentation for accuracy and completeness, and I agree with the above. -Jearld Lesch, DO

## 2019-01-19 MED FILL — VIT D2 1.25 MG (50,000 UNIT: 1.25 MG | 28 days supply | Qty: 4 | Fill #0

## 2019-01-20 ENCOUNTER — Other Ambulatory Visit: Payer: Self-pay

## 2019-01-20 ENCOUNTER — Encounter (INDEPENDENT_AMBULATORY_CARE_PROVIDER_SITE_OTHER): Payer: Self-pay | Admitting: Bariatrics

## 2019-01-20 ENCOUNTER — Ambulatory Visit (INDEPENDENT_AMBULATORY_CARE_PROVIDER_SITE_OTHER): Payer: Self-pay | Admitting: Bariatrics

## 2019-01-20 VITALS — BP 115/73 | HR 81 | Temp 98.5°F | Ht 65.0 in | Wt 262.0 lb

## 2019-01-20 DIAGNOSIS — M545 Low back pain, unspecified: Secondary | ICD-10-CM

## 2019-01-20 DIAGNOSIS — E559 Vitamin D deficiency, unspecified: Secondary | ICD-10-CM

## 2019-01-20 DIAGNOSIS — Z6841 Body Mass Index (BMI) 40.0 and over, adult: Secondary | ICD-10-CM

## 2019-01-21 NOTE — Progress Notes (Signed)
Office: [phone]  /  Fax: [phone]   HPI:   Chief Complaint: OBESITY Jenna [name] is here to discuss her progress with her obesity treatment plan. She is on the Category 2 plan and is following her eating plan approximately 95 % of the time. She states she is doing cardio for 8-15 minutes 3 times per week. Jenna [name] is down 1 lb and is doing well overall, but she thinks that she has hit a plateau.  Her weight is 262 lb (118.8 kg) today and has had a weight loss of 1 pound over a period of 2 weeks since her last visit. She has lost 15 lbs since starting treatment with Jenna [name].  Lower Back Pain Jenna [name] states her back pain has improved. She is taking Cymbalta and Robaxin.  Vitamin D Deficiency Jenna [name] has a diagnosis of vitamin D deficiency. She is currently taking prescription Vit D. Last Vit D level was 6.9. She denies nausea, vomiting or muscle weakness.  ASSESSMENT AND PLAN:  Low back pain, unspecified back pain laterality, unspecified chronicity, unspecified whether sciatica present  Vitamin D deficiency  Class 3 severe obesity with serious comorbidity and body mass index (BMI) of 40.0 to 44.9 in adult, unspecified obesity type (HCC)  PLAN:  Lower Back Pain Jenna [name] agrees to continue her medications, and she agrees to follow up with our clinic in 2 to 3 weeks.  Vitamin D Deficiency Jenna [name] was informed that low vitamin D levels contributes to fatigue and are associated with obesity, breast, and colon cancer. Jenna [name] agrees to continue taking prescription Vit D 50,000 IU every week and will follow up for routine testing of vitamin D, at least 2-3 times per year. She was informed of the risk of over-replacement of vitamin D and agrees to not increase her dose unless she discusses this with Jenna [name] first. Jenna [name] agrees to follow up with our clinic in 2 to 3 weeks.  I spent > than 50% of the 15 minute visit on counseling as documented in the note.  Obesity Jenna [name] is currently  in the action stage of change. As such, her goal is to continue with weight loss efforts She has agreed to follow the Category 2 plan Jenna [name] has been instructed to work up to a goal of 150 minutes of combined cardio and strengthening exercise per week or light weights or band 3-4 days per week for weight loss and overall health benefits. We discussed the following Behavioral Modification Strategies today: increasing lean protein intake, decreasing simple carbohydrates, increasing vegetables, decrease eating out, increase H20 intake, no skipping meals, work on meal planning and easy cooking plans, and keeping healthy foods in the home Thanksgiving handout was given.  Jenna [name] has agreed to follow up with our clinic in 2 to 3 weeks. She was informed of the importance of frequent follow up visits to maximize her success with intensive lifestyle modifications for her multiple health conditions.  ALLERGIES: No Known Allergies  MEDICATIONS: Current Outpatient Medications on File Prior to Visit  Medication Sig Dispense Refill  . DULoxetine (CYMBALTA) 60 MG capsule One tablet (60mg ) once per day for back pain and mood 90 capsule 1  . ferrous sulfate 324 MG TBEC Take 324 mg by mouth.    . methocarbamol (ROBAXIN) 500 MG tablet Take 1 tablet (500 mg total) by mouth 3 (three) times daily. As needed for muscle spasm 90 tablet 3  . Vitamin D, Ergocalciferol, (DRISDOL) 1.25 MG (50000 UT) CAPS capsule Take 1 capsule (50,000 Units total) by mouth  every 7 (seven) days. 4 capsule 0   No current facility-administered medications on file prior to visit.     PAST MEDICAL HISTORY: Past Medical History:  Diagnosis Date  . Arthritis   . Back pain   . Chest pain   . Depression   . Edema of both lower extremities   . High blood pressure   . Joint pain   . Osteoarthritis     PAST SURGICAL HISTORY: Past Surgical History:  Procedure Laterality Date  . CHOLECYSTECTOMY    . TUBAL LIGATION      SOCIAL  HISTORY: Social History   Tobacco Use  . Smoking status: Current Every Day Smoker    Packs/day: 0.10    Types: Cigarettes  . Smokeless tobacco: Never Used  Substance Use Topics  . Alcohol use: No    Alcohol/week: 0.0 standard drinks  . Drug use: No    FAMILY HISTORY: Family History  Problem Relation Age of Onset  . Diabetes Mother   . Hypertension Mother     ROS: Review of Systems  Constitutional: Positive for weight loss.  Gastrointestinal: Negative for nausea and vomiting.  Musculoskeletal: Positive for back pain (Lower).       Negative muscle weakness    PHYSICAL EXAM: Blood pressure 115/73, pulse 81, temperature 98.5 F (36.9 C), height 5\' 5"  (1.651 m), weight 262 lb (118.8 kg), last menstrual period 01/11/2019, SpO2 100 %. Body mass index is 43.6 kg/m. Physical Exam Vitals signs reviewed.  Constitutional:      Appearance: Normal appearance. She is obese.  Cardiovascular:     Rate and Rhythm: Normal rate.     Pulses: Normal pulses.  Pulmonary:     Effort: Pulmonary effort is normal.     Breath sounds: Normal breath sounds.  Musculoskeletal: Normal range of motion.  Skin:    General: Skin is warm and dry.  Neurological:     Mental Status: She is alert and oriented to person, place, and time.  Psychiatric:        Mood and Affect: Mood normal.        Behavior: Behavior normal.     RECENT LABS AND TESTS: BMET    Component Value Date/Time   NA 139 12/08/2018 1055   K 3.8 12/08/2018 1055   CL 105 12/08/2018 1055   CO2 16 (L) 12/08/2018 1055   GLUCOSE 86 12/08/2018 1055   BUN 7 12/08/2018 1055   CREATININE 0.83 12/08/2018 1055   CALCIUM 8.9 12/08/2018 1055   GFRNONAA 84 12/08/2018 1055   GFRAA 97 12/08/2018 1055   Lab Results  Component Value Date   HGBA1C 5.4 12/08/2018   HGBA1C 5.6 12/03/2017   Lab Results  Component Value Date   INSULIN 7.4 12/08/2018   CBC    Component Value Date/Time   WBC 7.0 12/03/2017 1220   RBC 3.91 12/03/2017  1220   HGB 10.8 (L) 12/03/2017 1220   HCT 34.2 12/03/2017 1220   PLT 295 12/03/2017 1220   MCV 88 12/03/2017 1220   MCH 27.6 12/03/2017 1220   MCHC 31.6 12/03/2017 1220   RDW 16.5 (H) 12/03/2017 1220   LYMPHSABS 3.2 (H) 12/03/2017 1220   EOSABS 0.3 12/03/2017 1220   BASOSABS 0.0 12/03/2017 1220   Iron/TIBC/Ferritin/ %Sat No results found for: IRON, TIBC, FERRITIN, IRONPCTSAT Lipid Panel     Component Value Date/Time   CHOL 143 12/08/2018 1055   TRIG 79 12/08/2018 1055   HDL 47 12/08/2018 1055   LDLCALC 81  12/08/2018 1055   Hepatic Function Panel     Component Value Date/Time   PROT 6.7 12/08/2018 1055   ALBUMIN 4.2 12/08/2018 1055   AST 20 12/08/2018 1055   ALT 16 12/08/2018 1055   ALKPHOS 66 12/08/2018 1055   BILITOT 0.4 12/08/2018 1055      Component Value Date/Time   TSH 1.040 12/08/2018 1055      OBESITY BEHAVIORAL INTERVENTION VISIT  Today's visit was # 4   Starting weight: 277 lbs Starting date: 12/08/2018 Today's weight : 262 lbs Today's date: 01/20/2019 Total lbs lost to date: 15    ASK: We discussed the diagnosis of obesity with [name] today and Shaye agreed to give [place] permission to discuss obesity behavioral modification therapy today.  ASSESS: Crystallee has the diagnosis of obesity and her BMI today is 43.6 Marene is in the action stage of change   ADVISE: Oluwadarasimi was educated on the multiple health risks of obesity as well as the benefit of weight loss to improve her health. She was advised of the need for long term treatment and the importance of lifestyle modifications to improve her current health and to decrease her risk of future health problems.  AGREE: Multiple dietary modification options and treatment options were discussed and  [facility] agreed to follow the recommendations documented in the above note.  ARRANGE: Shequita was educated on the importance of frequent visits to treat obesity as outlined per CMS and USPSTF  guidelines and agreed to schedule her next follow up appointment today.  Trude Mcburney, am acting as transcriptionist for [employer], DO  I have reviewed the above documentation for accuracy and completeness, and I agree with the above. -Corinna Capra, DO

## 2019-01-22 ENCOUNTER — Encounter (INDEPENDENT_AMBULATORY_CARE_PROVIDER_SITE_OTHER): Payer: Self-pay | Admitting: Bariatrics

## 2019-01-23 ENCOUNTER — Ambulatory Visit: Payer: Self-pay | Admitting: Family Medicine

## 2019-02-11 ENCOUNTER — Other Ambulatory Visit: Payer: Self-pay

## 2019-02-11 ENCOUNTER — Encounter (INDEPENDENT_AMBULATORY_CARE_PROVIDER_SITE_OTHER): Payer: Self-pay | Admitting: Bariatrics

## 2019-02-11 ENCOUNTER — Ambulatory Visit (INDEPENDENT_AMBULATORY_CARE_PROVIDER_SITE_OTHER): Payer: Self-pay | Admitting: Bariatrics

## 2019-02-11 VITALS — BP 115/79 | HR 84 | Temp 98.3°F | Ht 65.0 in | Wt 265.0 lb

## 2019-02-11 DIAGNOSIS — Z9189 Other specified personal risk factors, not elsewhere classified: Secondary | ICD-10-CM

## 2019-02-11 DIAGNOSIS — Z6841 Body Mass Index (BMI) 40.0 and over, adult: Secondary | ICD-10-CM

## 2019-02-11 DIAGNOSIS — E559 Vitamin D deficiency, unspecified: Secondary | ICD-10-CM

## 2019-02-11 DIAGNOSIS — M545 Low back pain, unspecified: Secondary | ICD-10-CM

## 2019-02-11 MED ORDER — VITAMIN D (ERGOCALCIFEROL) 1.25 MG (50000 UNIT) PO CAPS: 50000.0000 [IU] | ORAL_CAPSULE | ORAL | 0 refills | Status: DC

## 2019-02-11 NOTE — Progress Notes (Signed)
Office: [phone]  /  Fax: [phone]   HPI:   Chief Complaint: OBESITY Jenna [name] is here to discuss her progress with her obesity treatment plan. She is on the Category 2 plan and is following her eating plan approximately 50% of the time. She states she is doing squats and leg lifts 7-10 minutes 3 times per week. Jenna [name] is up 3 lbs (up 5 lbs of water).  Her weight is 265 lb (120.2 kg) today and has had a weight gain of 3 lbs since her last visit. She has lost 12 lbs since starting treatment with [place].  Low Back Pain Jenna [name] has intermittent low back pain. She is taking Robaxin and Cymbalta.  Vitamin D deficiency Jenna [name] has a diagnosis of Vitamin D deficiency. Last Vitamin D 6.9 on 12/08/2018. She is currently taking prescription Vit D and denies nausea, vomiting or muscle weakness. She reports minimal sunlight exposure.  At risk for osteopenia and osteoporosis Jenna [name] is at higher risk of osteopenia and osteoporosis due to Vitamin D deficiency.   ASSESSMENT AND PLAN:  Low back pain, unspecified back pain laterality, unspecified chronicity, unspecified whether sciatica present  Vitamin D deficiency - Plan: Vitamin D, Ergocalciferol, (DRISDOL) 1.25 MG (50000 UT) CAPS capsule  At risk for osteoporosis  Class 3 severe obesity with serious comorbidity and body mass index (BMI) of 40.0 to 44.9 in adult, unspecified obesity type (HCC)  PLAN:  Low Back Pain Jenna [name] will continue her medications. She will not do exercise that hurts her back.  Vitamin D Deficiency Jenna [name] was informed that low Vitamin D levels contributes to fatigue and are associated with obesity, breast, and colon cancer. She agrees to continue to take prescription Vit D @ 50,000 IU every week #4 with 0 refills and will follow-up for routine testing of Vitamin D, at least 2-3 times per year. She was informed of the risk of over-replacement of Vitamin D and agrees to not increase her dose unless she  discusses this with [place] first. Jenna [name] agrees to follow-up with our clinic in 2-3 weeks.  At risk for osteopenia and osteoporosis Jenna [name] was given extended  (15 minutes) osteoporosis prevention counseling today. Jenna [name] is at risk for osteopenia and osteoporosis due to her Vitamin D deficiency. She was encouraged to take her Vitamin D and follow her higher calcium diet and increase strengthening exercise to help strengthen her bones and decrease her risk of osteopenia and osteoporosis.  Obesity Jenna [name] is currently in the action stage of change. As such, her goal is to continue with weight loss efforts. She has agreed to follow the Category 2 plan. Jenna [name] will work on meal planning, increasing her water intake, and greater adherence to the plan. Jenna [name] has been instructed to increase her exercise for weight loss and overall health benefits. We discussed the following Behavioral Modification Strategies today: increasing lean protein intake, decreasing simple carbohydrates, increasing vegetables, increase H20 intake, decrease eating out, no skipping meals, work on meal planning and easy cooking plans, keeping healthy foods in the home, and planning for success.  Jenna [name] has agreed to follow-up with our clinic in 2-3 weeks. She was informed of the importance of frequent follow-up visits to maximize her success with intensive lifestyle modifications for her multiple health conditions.  ALLERGIES: No Known Allergies  MEDICATIONS: Current Outpatient Medications on File Prior to Visit  Medication Sig Dispense Refill  . DULoxetine (CYMBALTA) 60 MG capsule One tablet (60mg ) once per day for back pain and mood 90 capsule 1  .  ferrous sulfate 324 MG TBEC Take 324 mg by mouth.    . methocarbamol (ROBAXIN) 500 MG tablet Take 1 tablet (500 mg total) by mouth 3 (three) times daily. As needed for muscle spasm 90 tablet 3   No current facility-administered medications on file prior to visit.      PAST MEDICAL HISTORY: Past Medical History:  Diagnosis Date  . Arthritis   . Back pain   . Chest pain   . Depression   . Edema of both lower extremities   . High blood pressure   . Joint pain   . Osteoarthritis     PAST SURGICAL HISTORY: Past Surgical History:  Procedure Laterality Date  . CHOLECYSTECTOMY    . TUBAL LIGATION      SOCIAL HISTORY: Social History   Tobacco Use  . Smoking status: Current Every Day Smoker    Packs/day: 0.10    Types: Cigarettes  . Smokeless tobacco: Never Used  Substance Use Topics  . Alcohol use: No    Alcohol/week: 0.0 standard drinks  . Drug use: No    FAMILY HISTORY: Family History  Problem Relation Age of Onset  . Diabetes Mother   . Hypertension Mother    ROS: Review of Systems  Gastrointestinal: Negative for nausea and vomiting.  Musculoskeletal: Positive for back pain (low).       Negative for muscle weakness.   PHYSICAL EXAM: Blood pressure 115/79, pulse 84, temperature 98.3 F (36.8 C), height 5\' 5"  (1.651 m), weight 265 lb (120.2 kg), last menstrual period 02/05/2019. Body mass index is 44.1 kg/m. Physical Exam Vitals signs reviewed.  Constitutional:      Appearance: Normal appearance. She is obese.  Cardiovascular:     Rate and Rhythm: Normal rate.     Pulses: Normal pulses.  Pulmonary:     Effort: Pulmonary effort is normal.     Breath sounds: Normal breath sounds.  Musculoskeletal: Normal range of motion.  Skin:    General: Skin is warm and dry.  Neurological:     Mental Status: She is alert and oriented to person, place, and time.  Psychiatric:        Behavior: Behavior normal.   RECENT LABS AND TESTS: BMET    Component Value Date/Time   NA 139 12/08/2018 1055   K 3.8 12/08/2018 1055   CL 105 12/08/2018 1055   CO2 16 (L) 12/08/2018 1055   GLUCOSE 86 12/08/2018 1055   BUN 7 12/08/2018 1055   CREATININE 0.83 12/08/2018 1055   CALCIUM 8.9 12/08/2018 1055   GFRNONAA 84 12/08/2018 1055   GFRAA 97  12/08/2018 1055   Lab Results  Component Value Date   HGBA1C 5.4 12/08/2018   HGBA1C 5.6 12/03/2017   Lab Results  Component Value Date   INSULIN 7.4 12/08/2018   CBC    Component Value Date/Time   WBC 7.0 12/03/2017 1220   RBC 3.91 12/03/2017 1220   HGB 10.8 (L) 12/03/2017 1220   HCT 34.2 12/03/2017 1220   PLT 295 12/03/2017 1220   MCV 88 12/03/2017 1220   MCH 27.6 12/03/2017 1220   MCHC 31.6 12/03/2017 1220   RDW 16.5 (H) 12/03/2017 1220   LYMPHSABS 3.2 (H) 12/03/2017 1220   EOSABS 0.3 12/03/2017 1220   BASOSABS 0.0 12/03/2017 1220   Iron/TIBC/Ferritin/ %Sat No results found for: IRON, TIBC, FERRITIN, IRONPCTSAT Lipid Panel     Component Value Date/Time   CHOL 143 12/08/2018 1055   TRIG 79 12/08/2018 1055  HDL 47 12/08/2018 1055   LDLCALC 81 12/08/2018 1055   Hepatic Function Panel     Component Value Date/Time   PROT 6.7 12/08/2018 1055   ALBUMIN 4.2 12/08/2018 1055   AST 20 12/08/2018 1055   ALT 16 12/08/2018 1055   ALKPHOS 66 12/08/2018 1055   BILITOT 0.4 12/08/2018 1055      Component Value Date/Time   TSH 1.040 12/08/2018 1055   Results for MEAGHAN, WHISTLER (MRN [id number]) as of 02/11/2019 12:28  Ref. Range 12/08/2018 10:55  Vitamin D, 25-Hydroxy Latest Ref Range: 30.0 - 100.0 ng/mL 6.9 (L)   OBESITY BEHAVIORAL INTERVENTION VISIT  Today's visit was #5  Starting weight: 277 lbs Starting date: 12/08/2018 Today's weight: 265 lbs  Today's date: 02/11/2019 Total lbs lost to date: 12     02/11/2019  Height 5\' 5"  (1.651 m)  Weight 265 lb (120.2 kg)  BMI (Calculated) 44.1  BLOOD PRESSURE - SYSTOLIC 474  BLOOD PRESSURE - DIASTOLIC 79   Body Fat % 25.9 %  Total Body Water (lbs) 97.4 lbs   ASK: We discussed the diagnosis of obesity with Jenna [name] today and Jenna [name] agreed to give [place] permission to discuss obesity behavioral modification therapy today.  ASSESS: Jenna [name] has the diagnosis of obesity and her BMI today is 44.1. Jenna [name] is in the  action stage of change.   ADVISE: Jenna [name] was educated on the multiple health risks of obesity as well as the benefit of weight loss to improve her health. She was advised of the need for long term treatment and the importance of lifestyle modifications to improve her current health and to decrease her risk of future health problems.  AGREE: Multiple dietary modification options and treatment options were discussed and  Jenna [name] agreed to follow the recommendations documented in the above note.  ARRANGE: Jenna [name] was educated on the importance of frequent visits to treat obesity as outlined per CMS and USPSTF guidelines and agreed to schedule her next follow up appointment today.  Migdalia Dk, am acting as Location manager for [employer], DO   I have reviewed the above documentation for accuracy and completeness, and I agree with the above. -Jearld Lesch, DO

## 2019-02-17 ENCOUNTER — Other Ambulatory Visit: Payer: Self-pay

## 2019-02-17 ENCOUNTER — Encounter: Payer: Self-pay | Admitting: Orthopaedic Surgery

## 2019-02-17 ENCOUNTER — Ambulatory Visit (INDEPENDENT_AMBULATORY_CARE_PROVIDER_SITE_OTHER): Payer: Self-pay | Admitting: Orthopaedic Surgery

## 2019-02-17 VITALS — Ht 65.75 in | Wt 264.8 lb

## 2019-02-17 DIAGNOSIS — G8929 Other chronic pain: Secondary | ICD-10-CM

## 2019-02-17 DIAGNOSIS — M25562 Pain in left knee: Secondary | ICD-10-CM

## 2019-02-17 DIAGNOSIS — E669 Obesity, unspecified: Secondary | ICD-10-CM

## 2019-02-17 DIAGNOSIS — M1712 Unilateral primary osteoarthritis, left knee: Secondary | ICD-10-CM

## 2019-02-17 NOTE — Progress Notes (Signed)
The patient returns for follow-up today for mainly a weight and a BMI calculation.  She is only 48 years old and does have severe arthritis of her left knee with significant pain.  In September her weight was 283 pounds with a BMI of 48.  On today's weight she is down to 264 pounds and a BMI of 43.  That is a very encouraging trend and I let her know how proud we are of her that she is heading in the right direction.  Her left knee does hurt on a daily basis.  On exam it hyperextends as is her right knee.  She is getting more popping in her right knee and I think some of this is just the pressure she is putting through her right knee to offload her left knee.  Her calf is soft.  Both knees feel ligamentously stable.  I would like to see her back now in just 2 months.  If she is still trending down we will then work on scheduling her for knee replacement.  She understands this as well.  We will see her back for repeat weight and BMI calculation in just 2 months.  All question concerns were answered and addressed.

## 2019-02-20 ENCOUNTER — Other Ambulatory Visit: Payer: Self-pay

## 2019-02-20 ENCOUNTER — Ambulatory Visit: Payer: Self-pay | Attending: Family Medicine | Admitting: Family Medicine

## 2019-02-20 ENCOUNTER — Encounter: Payer: Self-pay | Admitting: Family Medicine

## 2019-02-20 DIAGNOSIS — F320 Major depressive disorder, single episode, mild: Secondary | ICD-10-CM

## 2019-02-20 DIAGNOSIS — M25562 Pain in left knee: Secondary | ICD-10-CM

## 2019-02-20 DIAGNOSIS — M545 Low back pain, unspecified: Secondary | ICD-10-CM

## 2019-02-20 DIAGNOSIS — R21 Rash and other nonspecific skin eruption: Secondary | ICD-10-CM

## 2019-02-20 DIAGNOSIS — G8929 Other chronic pain: Secondary | ICD-10-CM

## 2019-02-20 DIAGNOSIS — F32A Depression, unspecified: Secondary | ICD-10-CM

## 2019-02-20 DIAGNOSIS — M25561 Pain in right knee: Secondary | ICD-10-CM

## 2019-02-20 MED ORDER — CLOTRIMAZOLE-BETAMETHASONE 1-0.05 % EX CREA: 1.0000 "application " | TOPICAL_CREAM | Freq: Two times a day (BID) | CUTANEOUS | 5 refills | Status: DC

## 2019-02-20 MED ORDER — DULOXETINE HCL 60 MG PO CPEP: ORAL_CAPSULE | ORAL | 1 refills | Status: DC

## 2019-02-20 MED FILL — ?DULOXETINE HCL 60 MG CPEP: 60 | 30 days supply | Qty: 30 | Fill #0

## 2019-02-20 MED FILL — CLOTRIMAZOLE-BETAMETHASONE: 1-0.05 | 15 days supply | Qty: 30 | Fill #0

## 2019-02-20 NOTE — Progress Notes (Signed)
Patient has been called and DOB has been verified. Patient has been screened and transferred to PCP to start phone visit.     

## 2019-02-20 NOTE — Progress Notes (Signed)
Virtual Visit via Telephone Note  I connected with Jenna [name] on 02/20/19 at 11:10 AM EST by telephone and verified that I am speaking with the correct person using two identifiers.   I discussed the limitations, risks, security and privacy concerns of performing an evaluation and management service by telephone and the availability of in person appointments. I also discussed with the patient that there may be a patient responsible charge related to this service. The patient expressed understanding and agreed to proceed.  Patient Location: Home Provider Location: [facility] Others participating in call: none    History of Present Illness:         48 yo female who is seen in follow-up of chronic pain in her lower back, and OA of her knees and she has had issues with depression. She reports that she feels much better on the duloxetine and is not have any issues with crying for no reason and her mood is improved.  She has been seeing orthopedics in follow-up of her knee pain for which she needs knee replacement as well as follow-up of back pain.  She reports that she was referred to weight management program by orthopedics and she is slowly losing weight and is now able to exercise for up to 10 minutes at a time however this actually causes her to have some increase in her knee and back pain.  She does feel much better than when she was seen earlier in the year.         She does have new complaint of having very dry skin on her feet and someone told her that this might be related to diabetes however patient has had normal blood work both here and at [facility] management that did not indicate the presence of diabetes.  She states the rash is sometimes itchy and the skin on her feet is thick and dry and sometimes peels.  She would like to know what she can do to help with the rash on her feet.          On review of systems, she denies any current depression or anxiety, no suicidal thoughts or  ideations, no chest pain or palpitations, no shortness of breath or cough, no headaches or dizziness, no abdominal pain-no nausea/vomiting/diarrhea or constipation and no dark stools or blood in the stool.  No urinary frequency or dysuria.  Overall she states that she is feeling much better.  She still has some fatigue as well as knee and back pain but overall feels much better.   Past Medical History:  Diagnosis Date  . Arthritis   . Back pain   . Chest pain   . Depression   . Edema of both lower extremities   . High blood pressure   . Joint pain   . Osteoarthritis     Past Surgical History:  Procedure Laterality Date  . CHOLECYSTECTOMY    . TUBAL LIGATION      Family History  Problem Relation Age of Onset  . Diabetes Mother   . Hypertension Mother     Social History   Tobacco Use  . Smoking status: Current Every Day Smoker    Packs/day: 0.10    Types: Cigarettes  . Smokeless tobacco: Never Used  Substance Use Topics  . Alcohol use: No    Alcohol/week: 0.0 standard drinks  . Drug use: No     No Known Allergies     Observations/Objective: No vital signs or physical exam  conducted as visit was done via telephone  Assessment and Plan: 1. Low back pain with radiation; 4. Bilateral chronic knee pain; 2.  Mild chronic depression/mild episode of depression Patient's notes from orthopedics and weight loss management reviewed prior to today's visit.  Patient is doing well from the weight loss standpoint and may be able to soon have her knee replacement surgery.  Patient reports that she is having some relief of pain with use of duloxetine but more importantly has had increased mood and feels happy.  She has had no crying spells since starting the medication.  Refills provided and patient will follow-up as needed and in 4 to 5 months.  Continue weight management and orthopedic follow-up. - DULoxetine (CYMBALTA) 60 MG capsule; One tablet (60mg ) once per day for back pain and mood   Dispense: 90 capsule; Refill: 1  3. Rash of foot Patient has had normal hemoglobin A1c earlier this year as well as in September by weight management on review of chart.  Discussed with the patient that the rash could be fungal in origin if she is having some sweating of her feet during exercise but more likely I suspect that she may have dyshidrotic eczema.  She is encouraged to keep her feet well moisturized and prescription provided for Lotrisone which would help with her rash was related to fungal infection or eczema.  Patient should call or return if she is not having any improvement in her rash. - clotrimazole-betamethasone (LOTRISONE) cream; Apply 1 application topically 2 (two) times daily. X 10 days then as needed  Dispense: 60 g; Refill: 5   Follow Up Instructions:Return in about 5 months (around 07/21/2019) for Chronic issues; sooner if needed.    I discussed the assessment and treatment plan with the patient. The patient was provided an opportunity to ask questions and all were answered. The patient agreed with the plan and demonstrated an understanding of the instructions.   The patient was advised to call back or seek an in-person evaluation if the symptoms worsen or if the condition fails to improve as anticipated.  I provided 11 minutes of non-face-to-face time during this encounter.   Antony Blackbird, MD

## 2019-02-25 ENCOUNTER — Telehealth (INDEPENDENT_AMBULATORY_CARE_PROVIDER_SITE_OTHER): Payer: Self-pay | Admitting: Bariatrics

## 2019-02-25 ENCOUNTER — Other Ambulatory Visit: Payer: Self-pay

## 2019-02-25 ENCOUNTER — Encounter (INDEPENDENT_AMBULATORY_CARE_PROVIDER_SITE_OTHER): Payer: Self-pay | Admitting: Bariatrics

## 2019-02-25 DIAGNOSIS — E559 Vitamin D deficiency, unspecified: Secondary | ICD-10-CM

## 2019-02-25 DIAGNOSIS — M545 Low back pain, unspecified: Secondary | ICD-10-CM

## 2019-02-25 DIAGNOSIS — Z6841 Body Mass Index (BMI) 40.0 and over, adult: Secondary | ICD-10-CM

## 2019-02-25 MED ORDER — VITAMIN D (ERGOCALCIFEROL) 1.25 MG (50000 UNIT) PO CAPS: 50000.0000 [IU] | ORAL_CAPSULE | ORAL | 0 refills | Status: DC

## 2019-02-25 MED FILL — ?ERGOCALCIFEROL 50000 UNITC: 1.25 MG | 28 days supply | Qty: 4 | Fill #0

## 2019-02-26 NOTE — Progress Notes (Signed)
Office: [phone]  /  Fax: [phone] TeleHealth Visit:  Ross Hefferan has verbally consented to this TeleHealth visit today. The patient is located at home, the provider is located at the [facility] office. The participants in this visit include the listed provider and patient. The visit was conducted today via Webex.  HPI:  Chief Complaint: OBESITY Jenna [name] is here to discuss her progress with her obesity treatment plan. She is on the Category 2 plan and states she is following her eating plan approximately 95% of the time. She states she is walking/cardio 10 minutes 2 times per week.  Jenna [name] states her weight is down 5 lbs (weight 258) and reports doing well overall. She is drinking more water and eating more protein. She states she has cut back on her bread.  Today's visit was #6 Starting weight: 277 lbs Starting date: 12/08/2018   Vitamin D deficiency Jenna [name] has a diagnosis of Vitamin D deficiency and is taking prescription Vitamin D. No nausea, vomiting, or muscle weakness.  Low Back Pain (Unspecified) Jenna [name] has low back pain and is taking Robaxin and Cymbalta. She reports still having pain, increased with cold weather.  ASSESSMENT AND PLAN:  Low back pain, unspecified back pain laterality, unspecified chronicity, unspecified whether sciatica present  Vitamin D deficiency - Plan: Vitamin D, Ergocalciferol, (DRISDOL) 1.25 MG (50000 UT) CAPS capsule  Class 3 severe obesity with serious comorbidity and body mass index (BMI) of 40.0 to 44.9 in adult, unspecified obesity type (HCC)  PLAN:  Vitamin D Deficiency Jenna [name] was informed that low Vitamin D levels contributes to fatigue and are associated with obesity, breast, and colon cancer. She agrees to continue to take prescription Vit D @ 50,000 IU every week #4 with 0 refills and will follow-up for routine testing of Vitamin D, at least 2-3 times per year. She was informed of the risk of  over-replacement of Vitamin D and agrees to not increase her dose unless she discusses this with [place] first. Jenna [name] agrees to follow-up with our clinic in 3 weeks.  Low Back Pain (Unspecified) Jenna [name] was instructed to avoid pounding exercises and will remain active.  Obesity Jenna [name] is currently in the action stage of change. As such, her goal is to continue with weight loss efforts. She has agreed to follow the Category 2 plan. Jenna [name] will work on meal planning, intentional eating, and continue to increase her water and protein intake. Jenna [name] has been instructed to exercise as tolerated for weight loss and overall health benefits. We discussed the following Behavioral Modification Strategies today: increasing lean protein intake, decreasing simple carbohydrates, increasing vegetables, increase H20 intake, decrease eating out, no skipping meals, work on meal planning and easy cooking plans, and keeping healthy foods in the home.  Jenna [name] has agreed to follow-up with our clinic in 3 weeks. She was informed of the importance of frequent follow-up visits to maximize her success with intensive lifestyle modifications for her multiple health conditions.  ALLERGIES: No Known Allergies  MEDICATIONS: Current Outpatient Medications on File Prior to Visit  Medication Sig Dispense Refill  . clotrimazole-betamethasone (LOTRISONE) cream Apply 1 application topically 2 (two) times daily. X 10 days then as needed 60 g 5  . DULoxetine (CYMBALTA) 60 MG capsule One tablet (60mg ) once per day for back pain and mood 90 capsule 1  . ferrous sulfate 324 MG TBEC Take 324 mg by mouth.    . methocarbamol (ROBAXIN) 500 MG tablet Take 1 tablet (500 mg total) by mouth  3 (three) times daily. As needed for muscle spasm 90 tablet 3   No current facility-administered medications on file prior to visit.    PAST MEDICAL HISTORY: Past Medical History:  Diagnosis Date  . Arthritis   . Back pain   . Chest pain    . Depression   . Edema of both lower extremities   . High blood pressure   . Joint pain   . Osteoarthritis     PAST SURGICAL HISTORY: Past Surgical History:  Procedure Laterality Date  . CHOLECYSTECTOMY    . TUBAL LIGATION      SOCIAL HISTORY: Social History   Tobacco Use  . Smoking status: Current Every Day Smoker    Packs/day: 0.10    Types: Cigarettes  . Smokeless tobacco: Never Used  Substance Use Topics  . Alcohol use: No    Alcohol/week: 0.0 standard drinks  . Drug use: No    FAMILY HISTORY: Family History  Problem Relation Age of Onset  . Diabetes Mother   . Hypertension Mother    ROS: Review of Systems  Gastrointestinal: Negative for nausea and vomiting.  Musculoskeletal: Positive for back pain (low).       Negative for muscle weakness.   PHYSICAL EXAM: Last menstrual period 02/05/2019. There is no height or weight on file to calculate BMI. Physical Exam: Pt in no acute distress.  RECENT LABS AND TESTS: BMET    Component Value Date/Time   NA 139 12/08/2018 1055   K 3.8 12/08/2018 1055   CL 105 12/08/2018 1055   CO2 16 (L) 12/08/2018 1055   GLUCOSE 86 12/08/2018 1055   BUN 7 12/08/2018 1055   CREATININE 0.83 12/08/2018 1055   CALCIUM 8.9 12/08/2018 1055   GFRNONAA 84 12/08/2018 1055   GFRAA 97 12/08/2018 1055   Lab Results  Component Value Date   HGBA1C 5.4 12/08/2018   HGBA1C 5.6 12/03/2017   Lab Results  Component Value Date   INSULIN 7.4 12/08/2018   CBC    Component Value Date/Time   WBC 7.0 12/03/2017 1220   RBC 3.91 12/03/2017 1220   HGB 10.8 (L) 12/03/2017 1220   HCT 34.2 12/03/2017 1220   PLT 295 12/03/2017 1220   MCV 88 12/03/2017 1220   MCH 27.6 12/03/2017 1220   MCHC 31.6 12/03/2017 1220   RDW 16.5 (H) 12/03/2017 1220   LYMPHSABS 3.2 (H) 12/03/2017 1220   EOSABS 0.3 12/03/2017 1220   BASOSABS 0.0 12/03/2017 1220   Iron/TIBC/Ferritin/ %Sat No results found for: IRON, TIBC, FERRITIN, IRONPCTSAT Lipid Panel       Component Value Date/Time   CHOL 143 12/08/2018 1055   TRIG 79 12/08/2018 1055   HDL 47 12/08/2018 1055   LDLCALC 81 12/08/2018 1055   Hepatic Function Panel     Component Value Date/Time   PROT 6.7 12/08/2018 1055   ALBUMIN 4.2 12/08/2018 1055   AST 20 12/08/2018 1055   ALT 16 12/08/2018 1055   ALKPHOS 66 12/08/2018 1055   BILITOT 0.4 12/08/2018 1055      Component Value Date/Time   TSH 1.040 12/08/2018 1055    OBESITY BEHAVIORAL INTERVENTION VISIT DOCUMENTATION FOR INSURANCE (~15 minutes)  I, Marianna Payment, am acting as Energy manager for [employer], DO  I have reviewed the above documentation for accuracy and completeness, and I agree with the above. Corinna Capra, DO

## 2019-03-19 ENCOUNTER — Encounter (INDEPENDENT_AMBULATORY_CARE_PROVIDER_SITE_OTHER): Payer: Self-pay | Admitting: Bariatrics

## 2019-03-19 ENCOUNTER — Ambulatory Visit (INDEPENDENT_AMBULATORY_CARE_PROVIDER_SITE_OTHER): Payer: Self-pay | Admitting: Bariatrics

## 2019-03-19 ENCOUNTER — Other Ambulatory Visit: Payer: Self-pay

## 2019-03-19 VITALS — BP 119/76 | HR 80 | Temp 98.4°F | Ht 65.0 in | Wt 265.0 lb

## 2019-03-19 DIAGNOSIS — E559 Vitamin D deficiency, unspecified: Secondary | ICD-10-CM

## 2019-03-19 DIAGNOSIS — M170 Bilateral primary osteoarthritis of knee: Secondary | ICD-10-CM

## 2019-03-19 DIAGNOSIS — Z6841 Body Mass Index (BMI) 40.0 and over, adult: Secondary | ICD-10-CM

## 2019-03-19 MED ORDER — VITAMIN D (ERGOCALCIFEROL) 1.25 MG (50000 UNIT) PO CAPS: 50000.0000 [IU] | ORAL_CAPSULE | ORAL | 0 refills | Status: DC

## 2019-03-19 MED FILL — ?ERGOCALCIFEROL 50000 UNITC: 1.25 MG | 30 days supply | Qty: 4 | Fill #0

## 2019-03-23 NOTE — Progress Notes (Signed)
Chief Complaint:   OBESITY Jenna [name] is here to discuss her progress with her obesity treatment plan along with follow-up of her obesity related diagnoses. Jenna [name] is on the Category 2 Plan and states she is following her eating plan approximately 50% of the time. Jenna [name] states she is not exercising.   Today's visit was #: 7 Starting weight: 277 lbs Starting date: 12/08/18 Today's weight: 264 lbs Today's date: 03/23/2019 Total lbs lost to date: 13 lbs Total lbs lost since last in-office visit: 1 lb  Interim History: Jenna [name] is down 1 lb. She has been under more stress. Her appetite has been low.   Subjective:   1. Vitamin D deficiency She's Vitamin D level was 6.9 on 12/08/18. She is currently taking vit D. She denies nausea, vomiting or muscle weakness.   2. Osteoarthritis of both knees, unspecified osteoarthritis type She is taking Cymbalta and Aleve as needed.   Assessment/Plan:   1. Vitamin D deficiency Low Vitamin D level contributes to fatigue and are associated with obesity, breast, and colon cancer. She agrees to continue to take prescription Vitamin D @50 ,000 IU every week and will follow-up for routine testing of vitamin D, at least 2-3 times per year to avoid over-replacement. - Vitamin D, Ergocalciferol, (DRISDOL) 1.25 MG (50000 UT) CAPS capsule; Take 1 capsule (50,000 Units total) by mouth every 7 (seven) days.  Dispense: 4 capsule; Refill: 0  2. Osteoarthritis of both knees, unspecified osteoarthritis type We discussed no knee pounding. She will continue to walk and current medications. Orders and follow up as documented in patient record.  3. Class 3 severe obesity with serious comorbidity and body mass index (BMI) of 40.0 to 44.9 in adult, unspecified obesity type (HCC) Jenna [name] is currently in the action stage of change. As such, her goal is to continue with weight loss efforts. She has agreed to on the Category 2 Plan. We discussed meal planning,  intentional eating, being more adherent to the plan, and increasing water intake.   We discussed the following exercise goals today: Increase walking   We discussed the following behavioral modification strategies today: increasing lean protein intake, decreasing simple carbohydrates, increasing vegetables, increasing water intake, decreasing eating out, no skipping meals, meal planning and cooking strategies, keeping healthy foods in the home and planning for success.  Jenna [name] has agreed to follow-up with our clinic in 2-3 weeks. She was informed of the importance of frequent follow-up visits to maximize her success with intensive lifestyle modifications for her multiple health conditions.   Objective:   Pulse 80, temperature 98.4 F (36.9 C), height 5\' 5"  (1.651 m), weight 265 lb (120.2 kg), last menstrual period 02/24/2019, SpO2 100 %. Body mass index is 44.1 kg/m.  General: Cooperative, alert, well developed, in no acute distress. HEENT: Conjunctivae and lids unremarkable. Neck: No thyromegaly.  Cardiovascular: Regular rhythm.  Lungs: Normal work of breathing. Extremities: No edema.  Neurologic: No focal deficits.   Lab Results  Component Value Date   CREATININE 0.83 12/08/2018   BUN 7 12/08/2018   NA 139 12/08/2018   K 3.8 12/08/2018   CL 105 12/08/2018   CO2 16 (L) 12/08/2018   Lab Results  Component Value Date   ALT 16 12/08/2018   AST 20 12/08/2018   ALKPHOS 66 12/08/2018   BILITOT 0.4 12/08/2018   Lab Results  Component Value Date   HGBA1C 5.4 12/08/2018   HGBA1C 5.6 12/03/2017   Lab Results  Component Value Date  INSULIN 7.4 12/08/2018   Lab Results  Component Value Date   TSH 1.040 12/08/2018   Lab Results  Component Value Date   CHOL 143 12/08/2018   HDL 47 12/08/2018   LDLCALC 81 12/08/2018   TRIG 79 12/08/2018   Lab Results  Component Value Date   WBC 7.0 12/03/2017   HGB 10.8 (L) 12/03/2017   HCT 34.2 12/03/2017   MCV 88  12/03/2017   PLT 295 12/03/2017    Attestation Statements:   Reviewed by clinician on day of visit: allergies, medications, problem list, medical history, surgical history, family history, social history, and previous encounter notes.  I, Jeralene Peters, am acting as Energy manager for [employer], DO.  I have reviewed the above documentation for accuracy and completeness, and I agree with the above. Corinna Capra, DO

## 2019-03-24 ENCOUNTER — Encounter (INDEPENDENT_AMBULATORY_CARE_PROVIDER_SITE_OTHER): Payer: Self-pay | Admitting: Bariatrics

## 2019-03-25 MED FILL — CLOTRIMAZOLE-BETAMETHASONE: 1-0.05 | 15 days supply | Qty: 30 | Fill #1

## 2019-04-02 ENCOUNTER — Encounter (INDEPENDENT_AMBULATORY_CARE_PROVIDER_SITE_OTHER): Payer: Self-pay | Admitting: Bariatrics

## 2019-04-02 ENCOUNTER — Ambulatory Visit (INDEPENDENT_AMBULATORY_CARE_PROVIDER_SITE_OTHER): Payer: Self-pay | Admitting: Bariatrics

## 2019-04-02 ENCOUNTER — Other Ambulatory Visit: Payer: Self-pay

## 2019-04-02 VITALS — BP 126/82 | HR 77 | Temp 94.4°F | Ht 65.0 in | Wt 262.0 lb

## 2019-04-02 DIAGNOSIS — M545 Low back pain, unspecified: Secondary | ICD-10-CM

## 2019-04-02 DIAGNOSIS — Z6841 Body Mass Index (BMI) 40.0 and over, adult: Secondary | ICD-10-CM

## 2019-04-02 DIAGNOSIS — D508 Other iron deficiency anemias: Secondary | ICD-10-CM

## 2019-04-02 DIAGNOSIS — E559 Vitamin D deficiency, unspecified: Secondary | ICD-10-CM

## 2019-04-03 LAB — VITAMIN D 25 HYDROXY (VIT D DEFICIENCY, FRACTURES): Vit D, 25-Hydroxy: 24.8 ng/mL — ABNORMAL LOW (ref 30.0–100.0)

## 2019-04-03 LAB — CBC WITH DIFFERENTIAL/PLATELET
Basophils Absolute: 0 10*3/uL (ref 0.0–0.2)
Basos: 1 %
EOS (ABSOLUTE): 0.2 10*3/uL (ref 0.0–0.4)
Eos: 4 %
Hemoglobin: 12.5 g/dL (ref 11.1–15.9)
Immature Grans (Abs): 0 10*3/uL (ref 0.0–0.1)
Immature Granulocytes: 0 %
Lymphocytes Absolute: 2.5 10*3/uL (ref 0.7–3.1)
Lymphs: 44 %
MCH: 29.6 pg (ref 26.6–33.0)
MCHC: 33.1 g/dL (ref 31.5–35.7)
MCV: 89 fL (ref 79–97)
Monocytes Absolute: 0.4 10*3/uL (ref 0.1–0.9)
Monocytes: 7 %
Neutrophils Absolute: 2.5 10*3/uL (ref 1.4–7.0)
Neutrophils: 44 %
Platelets: 253 10*3/uL (ref 150–450)
RBC: 4.23 x10E6/uL (ref 3.77–5.28)
RDW: 13.8 % (ref 11.7–15.4)
WBC: 5.6 10*3/uL (ref 3.4–10.8)

## 2019-04-03 LAB — COMPREHENSIVE METABOLIC PANEL
ALT: 13 IU/L (ref 0–32)
AST: 16 IU/L (ref 0–40)
Albumin/Globulin Ratio: 1.3 (ref 1.2–2.2)
Albumin: 4 g/dL (ref 3.8–4.8)
Alkaline Phosphatase: 73 IU/L (ref 39–117)
BUN/Creatinine Ratio: 9 (ref 9–23)
BUN: 8 mg/dL (ref 6–24)
Bilirubin Total: 0.4 mg/dL (ref 0.0–1.2)
CO2: 22 mmol/L (ref 20–29)
Calcium: 9.6 mg/dL (ref 8.7–10.2)
Chloride: 107 mmol/L — ABNORMAL HIGH (ref 96–106)
Creatinine, Ser: 0.85 mg/dL (ref 0.57–1.00)
GFR calc Af Amer: 94 mL/min/{1.73_m2} (ref >59)
GFR calc non Af Amer: 82 mL/min/{1.73_m2} (ref >59)
Globulin, Total: 3 g/dL (ref 1.5–4.5)
Glucose: 81 mg/dL (ref 65–99)
Potassium: 4.7 mmol/L (ref 3.5–5.2)
Sodium: 142 mmol/L (ref 134–144)
Total Protein: 7 g/dL (ref 6.0–8.5)

## 2019-04-03 LAB — ANEMIA PANEL
Ferritin: 26 ng/mL (ref 15–150)
Folate, Hemolysate: 233 ng/mL
Folate, RBC: 616 ng/mL (ref >498)
Hematocrit: 37.8 % (ref 34.0–46.6)
Iron Saturation: 15 % (ref 15–55)
Iron: 53 ug/dL (ref 27–159)
Retic Ct Pct: 1.5 % (ref 0.6–2.6)
Total Iron Binding Capacity: 360 ug/dL (ref 250–450)
UIBC: 307 ug/dL (ref 131–425)
Vitamin B-12: 454 pg/mL (ref 232–1245)

## 2019-04-06 NOTE — Progress Notes (Signed)
Chief Complaint:   OBESITY Jenna [name] is here to discuss her progress with her obesity treatment plan along with follow-up of her obesity related diagnoses. Jenna [name] is on the Category 2 Plan and states she is following her eating plan approximately 95% of the time. Jenna [name] states she is doing 0 minutes 0 times per week.  Today's visit was #: 8 Starting weight: 277 lbs Starting date: 12/08/2018 Today's weight: 262 lbs Today's date: 04/02/2019 Total lbs lost to date: 15 Total lbs lost since last in-office visit: 3  Interim History: Jenna [name] is down 3 lbs. She increased her protein and has decreased her carbohydrates.  Subjective:   1. Vitamin D deficiency Lakima is taking Vit D. She denies side effects and her last Vit D was 6.7.  2. Low back pain, unspecified back pain laterality, unspecified chronicity, unspecified whether sciatica present Jenna [name] has lower back pain with leg pain and radiation. She is seeing an Orthopedist.  3. Other iron deficiency anemia Jenna [name] notes heavy periods. Last hemoglobin was 10.8 and HCT was 34.2.   Assessment/Plan:   1. Vitamin D deficiency Low Vitamin D level contributes to fatigue and are associated with obesity, breast, and colon cancer. Jenna [name] will continue her Vit D, and we will check labs today. She will follow-up for routine testing of Vitamin D, at least 2-3 times per year to avoid over-replacement.  - Comprehensive metabolic panel - VITAMIN D 25 Hydroxy (Vit-D Deficiency, Fractures)  2. Low back pain, unspecified back pain laterality, unspecified chronicity, unspecified whether sciatica present Jenna [name] will continue to follow up with the Orthopedist, and no pounding exercise. We will continue to monitor.  3. Other iron deficiency anemia We will check labs today. Orders and follow up as documented in patient record.  Counseling . Iron is essential for our bodies to make red blood cells. Reasons that someone may be deficient  include: an iron-deficient diet (more likely in those following vegan or vegetarian diets), women with heavy menses, patients with GI disorders or poor absorption, patients that have had bariatric surgery, frequent blood donors, patients with cancer, and patients with heart disease.   Jenna [name] foods include dark leafy greens, red and white meats, eggs, seafood, and beans.   . Certain foods and drinks prevent your body from absorbing iron properly. Avoid eating these foods in the same meal as iron-rich foods or with iron supplements. These foods include: coffee, black tea, and red wine; milk, dairy products, and foods that are high in calcium; beans and soybeans; whole grains.  . Constipation can be a side effect of iron supplementation. Increased water and fiber intake are helpful. Water goal: > 2 liters/day. Fiber goal: > 25 grams/day.  - CBC with Differential/Platelet - Comprehensive metabolic panel - Anemia panel  4. Class 3 severe obesity with serious comorbidity and body mass index (BMI) of 40.0 to 44.9 in adult, unspecified obesity type (HCC) Jenna [name] is currently in the action stage of change. As such, her goal is to continue with weight loss efforts. She has agreed to the Category 2 Plan.   Jenna [name] is to continue to add in more vegetables.  Exercise goals: Jenna [name] is to do some walking, but limit until she sees the Orthopedist.  Behavioral modification strategies: increasing lean protein intake, decreasing simple carbohydrates, increasing vegetables, increasing water intake, decreasing eating out, no skipping meals, meal planning and cooking strategies, keeping healthy foods in the home and planning for success.  Jenna [name] has agreed to follow-up with our clinic  in 2 weeks. She was informed of the importance of frequent follow-up visits to maximize her success with intensive lifestyle modifications for her multiple health conditions.   Jenna [name] was informed we would discuss her lab  results at her next visit unless there is a critical issue that needs to be addressed sooner. Jenna [name] agreed to keep her next visit at the agreed upon time to discuss these results.  Objective:   Blood pressure 126/82, pulse 77, temperature (!) 94.4 F (34.7 C), height 5\' 5"  (1.651 m), weight 262 lb (118.8 kg), last menstrual period 04/01/2019, SpO2 98 %. Body mass index is 43.6 kg/m.  General: Cooperative, alert, well developed, in no acute distress. HEENT: Conjunctivae and lids unremarkable. Cardiovascular: Regular rhythm.  Lungs: Normal work of breathing. Neurologic: No focal deficits.   Lab Results  Component Value Date   CREATININE 0.85 04/02/2019   BUN 8 04/02/2019   NA 142 04/02/2019   K 4.7 04/02/2019   CL 107 (H) 04/02/2019   CO2 22 04/02/2019   Lab Results  Component Value Date   ALT 13 04/02/2019   AST 16 04/02/2019   ALKPHOS 73 04/02/2019   BILITOT 0.4 04/02/2019   Lab Results  Component Value Date   HGBA1C 5.4 12/08/2018   HGBA1C 5.6 12/03/2017   Lab Results  Component Value Date   INSULIN 7.4 12/08/2018   Lab Results  Component Value Date   TSH 1.040 12/08/2018   Lab Results  Component Value Date   CHOL 143 12/08/2018   HDL 47 12/08/2018   LDLCALC 81 12/08/2018   TRIG 79 12/08/2018   Lab Results  Component Value Date   WBC 5.6 04/02/2019   HGB 12.5 04/02/2019   HCT 37.8 04/02/2019   MCV 89 04/02/2019   PLT 253 04/02/2019   Lab Results  Component Value Date   IRON 53 04/02/2019   TIBC 360 04/02/2019   FERRITIN 26 04/02/2019   Attestation Statements:   Reviewed by clinician on day of visit: allergies, medications, problem list, medical history, surgical history, family history, social history, and previous encounter notes.   04/04/2019, am acting as Trude Mcburney for Energy manager, DO.  I have reviewed the above documentation for accuracy and completeness, and I agree with the above. [employer], DO

## 2019-04-09 ENCOUNTER — Ambulatory Visit: Payer: Self-pay | Attending: Family Medicine | Admitting: Family Medicine

## 2019-04-09 ENCOUNTER — Other Ambulatory Visit: Payer: Self-pay

## 2019-04-09 ENCOUNTER — Encounter: Payer: Self-pay | Admitting: Family Medicine

## 2019-04-09 VITALS — BP 124/78 | HR 87 | Temp 97.7°F | Ht 65.0 in | Wt 271.0 lb

## 2019-04-09 DIAGNOSIS — M25511 Pain in right shoulder: Secondary | ICD-10-CM

## 2019-04-09 DIAGNOSIS — M67431 Ganglion, right wrist: Secondary | ICD-10-CM

## 2019-04-09 DIAGNOSIS — F32A Depression, unspecified: Secondary | ICD-10-CM

## 2019-04-09 DIAGNOSIS — M545 Low back pain, unspecified: Secondary | ICD-10-CM

## 2019-04-09 DIAGNOSIS — F320 Major depressive disorder, single episode, mild: Secondary | ICD-10-CM

## 2019-04-09 MED ORDER — DULOXETINE HCL 60 MG PO CPEP: ORAL_CAPSULE | ORAL | 1 refills | Status: DC

## 2019-04-09 MED FILL — ?DULOXETINE HCL 60 MG CPEP: 60 | 30 days supply | Qty: 30 | Fill #0

## 2019-04-09 NOTE — Progress Notes (Signed)
Established Patient Office Visit  Subjective:  Patient ID: Jenna [name], female    DOB: 12-Jan-1971  Age: 49 y.o. MRN: [id number]  CC:  Chief Complaint  Patient presents with  . Wrist Pain    HPI [name], 49 year old female who presents secondary to complaint of recent onset of a nodule on the back of her right wrist which causes discomfort with movement of her hand and wrist.  She has also noticed over the past month or so that she has had increased pain in her right shoulder and has difficulty trying to reach overhead.  She additionally continues to have chronic issues with knee pain and is participating in a nutritional weight loss program through [facility] as well as having continued issues with low back pain with radiation.  She has been told that she will need to lose weight prior to being eligible to have knee replacement surgery.  She does report some improvement in her low back pain with radiation as well as improvement in depression symptoms since starting duloxetine but she also needs a refill of this medication.  Since she has been on the medication, she is no longer having episodes of crying and feels that her mood is improved overall.  Past Medical History:  Diagnosis Date  . Arthritis   . Back pain   . Chest pain   . Depression   . Edema of both lower extremities   . High blood pressure   . Joint pain   . Osteoarthritis     Past Surgical History:  Procedure Laterality Date  . CHOLECYSTECTOMY    . TUBAL LIGATION      Family History  Problem Relation Age of Onset  . Diabetes Mother   . Hypertension Mother     Social History   Socioeconomic History  . Marital status: Unknown    Spouse name: Not on file  . Number of children: Not on file  . Years of education: Not on file  . Highest education level: Not on file  Occupational History  . Occupation: Stay at home  Tobacco Use  . Smoking status: Current Every Day Smoker    Packs/day: 0.10   Types: Cigarettes  . Smokeless tobacco: Never Used  Substance and Sexual Activity  . Alcohol use: No    Alcohol/week: 0.0 standard drinks  . Drug use: No  . Sexual activity: Not on file  Other Topics Concern  . Not on file  Social History Narrative  . Not on file   Social Determinants of Health   Financial Resource Strain:   . Difficulty of Paying Living Expenses:   Food Insecurity:   . Worried About Programme researcher, broadcasting/film/video in the Last Year:   . Barista in the Last Year:   Transportation Needs:   . Freight forwarder (Medical):   [name] Lack of Transportation (Non-Medical):   Physical Activity:   . Days of Exercise per Week:   . Minutes of Exercise per Session:   Stress:   . Feeling of Stress :   Social Connections:   . Frequency of Communication with Friends and Family:   . Frequency of Social Gatherings with Friends and Family:   . Attends Religious Services:   . Active Member of Clubs or Organizations:   . Attends Banker Meetings:   [name] Marital Status:   Intimate Partner Violence:   . Fear of Current or Ex-Partner:   . Emotionally Abused:   .  Physically Abused:   . Sexually Abused:     Outpatient Medications Prior to Visit  Medication Sig Dispense Refill  . ferrous sulfate 324 MG TBEC Take 324 mg by mouth.    . methocarbamol (ROBAXIN) 500 MG tablet Take 1 tablet (500 mg total) by mouth 3 (three) times daily. As needed for muscle spasm 90 tablet 3  . DULoxetine (CYMBALTA) 60 MG capsule One tablet (60mg ) once per day for back pain and mood 90 capsule 1  . Vitamin D, Ergocalciferol, (DRISDOL) 1.25 MG (50000 UT) CAPS capsule Take 1 capsule (50,000 Units total) by mouth every 7 (seven) days. 4 capsule 0  . clotrimazole-betamethasone (LOTRISONE) cream Apply 1 application topically 2 (two) times daily. X 10 days then as needed (Patient not taking: Reported on 04/09/2019) 60 g 5   No facility-administered medications prior to visit.    No Known  Allergies  ROS Review of Systems  Constitutional: Positive for fatigue. Negative for chills and fever.  HENT: Negative for sore throat and trouble swallowing.   Respiratory: Negative for cough and shortness of breath.   Cardiovascular: Negative for chest pain and palpitations.  Gastrointestinal: Negative for abdominal pain, blood in stool, constipation, diarrhea and nausea.  Endocrine: Negative for polydipsia, polyphagia and polyuria.  Genitourinary: Negative for dysuria and frequency.  Musculoskeletal: Positive for arthralgias, back pain, gait problem and myalgias.  Neurological: Negative for dizziness and headaches.  Hematological: Negative for adenopathy. Does not bruise/bleed easily.  Psychiatric/Behavioral: Negative for self-injury and suicidal ideas.      Objective:    Physical Exam  Constitutional: She is oriented to person, place, and time. She appears well-developed and well-nourished.  Well-nourished well-developed morbidly obese female in no acute distress wearing mask as per office COVID-19 protocol  Cardiovascular: Normal rate and regular rhythm.  Pulmonary/Chest: Effort normal and breath sounds normal.  Abdominal: Soft. There is no abdominal tenderness. There is no rebound and no guarding.  Musculoskeletal:        General: Tenderness and edema present.     Comments: Patient with a slightly tender compressible nodule on the dorsum of the right wrist consistent with a ganglion cyst.  Patient with positive empty can sign at the right shoulder and decreased range of motion of the right shoulder with attempt to reach overhead.  Discomfort with palpation of the anterior lateral and anterior posterior right shoulder.  Patient with bilateral knee joint line tenderness left greater than right.  Patient with lumbosacral tenderness to palpation and right SI joint tenderness.  Mild lumbar and lumbosacral paraspinous spasm  Neurological: She is alert and oriented to person, place, and  time.  Skin: Skin is warm and dry.  Psychiatric: She has a normal mood and affect. Her behavior is normal.  Nursing note and vitals reviewed.   BP 124/78 (BP Location: Left Arm, Patient Position: Sitting, Cuff Size: Large)   Pulse 87   Temp 97.7 F (36.5 C) (Oral)   Ht 5\' 5"  (1.651 m)   Wt 271 lb (122.9 kg)   LMP 04/01/2019   SpO2 100%   BMI 45.10 kg/m  Wt Readings from Last 3 Encounters:  06/18/19 268 lb (121.6 kg)  06/16/19 271 lb (122.9 kg)  05/19/19 266 lb (120.7 kg)     Health Maintenance Due  Topic Date Due  . HIV Screening  Never done  . TETANUS/TDAP  Never done  . PAP SMEAR-Modifier  Never done     Lab Results  Component Value Date   TSH  1.040 12/08/2018   Lab Results  Component Value Date   WBC 5.6 04/02/2019   HGB 12.5 04/02/2019   HCT 37.8 04/02/2019   MCV 89 04/02/2019   PLT 253 04/02/2019   Lab Results  Component Value Date   NA 142 04/02/2019   K 4.7 04/02/2019   CO2 22 04/02/2019   GLUCOSE 81 04/02/2019   BUN 8 04/02/2019   CREATININE 0.85 04/02/2019   BILITOT 0.4 04/02/2019   ALKPHOS 73 04/02/2019   AST 16 04/02/2019   ALT 13 04/02/2019   PROT 7.0 04/02/2019   ALBUMIN 4.0 04/02/2019   CALCIUM 9.6 04/02/2019   Lab Results  Component Value Date   CHOL 143 12/08/2018   Lab Results  Component Value Date   HDL 47 12/08/2018   Lab Results  Component Value Date   LDLCALC 81 12/08/2018   Lab Results  Component Value Date   TRIG 79 12/08/2018   No results found for: [facility] Lab Results  Component Value Date   HGBA1C 5.4 12/08/2018      Assessment & Plan:  1. Ganglion cyst of dorsum of right wrist Patient with a ganglion cyst on the right wrist for which she will be referred to orthopedics for further evaluation and treatment. - Ambulatory referral to Orthopedic Surgery  2. Acute pain of right shoulder She additionally has issues with acute right shoulder pain with probable rotator cuff tendinopathy.  She may take  over-the-counter pain medication as needed and will be referred to orthopedic surgery for further evaluation and treatment. - Ambulatory referral to Orthopedic Surgery  3. Low back pain with radiation She continues to have chronic issues with low back pain with radiation but has noticed some improvement in her back pain with use of Cymbalta which has also helped with her depression.  She continues to have orthopedic follow-up regarding her low back pain and is participating in a nutritional weight loss program. - DULoxetine (CYMBALTA) 60 MG capsule; One tablet (60mg ) once per day for back pain and mood  Dispense: 90 capsule; Refill: 1  4. Mild episode of depression ([place]) She continues to take Cymbalta for treatment of depression along with treatment of her back pain.  She has had no additional episodes of tearfulness since being on the medication as she feels that it has helped with her chronic depression issues - DULoxetine (CYMBALTA) 60 MG capsule; One tablet (60mg ) once per day for back pain and mood  Dispense: 90 capsule; Refill: 1   An After Visit Summary was printed and given to the patient.   Follow-up: Return in about 4 months (around 08/07/2019) for chronic issues and as needed.    Antony Blackbird, MD

## 2019-04-09 NOTE — Patient Instructions (Signed)
Ganglion Cyst  A ganglion cyst is a non-cancerous, fluid-filled lump that occurs near a joint or tendon. The cyst grows out of a joint or the lining of a tendon. Ganglion cysts most often develop in the hand or wrist, but they can also develop in the shoulder, elbow, hip, knee, ankle, or foot. Ganglion cysts are ball-shaped or egg-shaped. Their size can range from the size of a pea to larger than a grape. Increased activity may cause the cyst to get bigger because more fluid starts to build up. What are the causes? The exact cause of this condition is not known, but it may be related to:  Inflammation or irritation around the joint.  An injury.  Repetitive movements or overuse.  Arthritis. What increases the risk? You are more likely to develop this condition if:  You are a woman.  You are 15-40 years old. What are the signs or symptoms? The main symptom of this condition is a lump. It most often appears on the hand or wrist. In many cases, there are no other symptoms, but a cyst can sometimes cause:  Tingling.  Pain.  Numbness.  Muscle weakness.  Weak grip.  Less range of motion in a joint. How is this diagnosed? Ganglion cysts are usually diagnosed based on a physical exam. Your health care provider will feel the lump and may shine a light next to it. If it is a ganglion cyst, the light will likely shine through it. Your health care provider may order an X-ray, ultrasound, or MRI to rule out other conditions. How is this treated? Ganglion cysts often go away on their own without treatment. If you have pain or other symptoms, treatment may be needed. Treatment is also needed if the ganglion cyst limits your movement or if it gets infected. Treatment may include:  Wearing a brace or splint on your wrist or finger.  Taking anti-inflammatory medicine.  Having fluid drained from the lump with a needle (aspiration).  Getting a steroid injected into the joint.  Having  surgery to remove the ganglion cyst.  Placing a pad on your shoe or wearing shoes that will not rub against the cyst if it is on your foot. Follow these instructions at home:  Do not press on the ganglion cyst, poke it with a needle, or hit it.  Take over-the-counter and prescription medicines only as told by your health care provider.  If you have a brace or splint: ? Wear it as told by your health care provider. ? Remove it as told by your health care provider. Ask if you need to remove it when you take a shower or a bath.  Watch your ganglion cyst for any changes.  Keep all follow-up visits as told by your health care provider. This is important. Contact a health care provider if:  Your ganglion cyst becomes larger or more painful.  You have pus coming from the lump.  You have weakness or numbness in the affected area.  You have a fever or chills. Get help right away if:  You have a fever and have any of these in the cyst area: ? Increased redness. ? Red streaks. ? Swelling. Summary  A ganglion cyst is a non-cancerous, fluid-filled lump that occurs near a joint or tendon.  Ganglion cysts most often develop in the hand or wrist, but they can also develop in the shoulder, elbow, hip, knee, ankle, or foot.  Ganglion cysts often go away on their own without treatment.   This information is not intended to replace advice given to you by your health care provider. Make sure you discuss any questions you have with your health care provider. Document Revised: 02/08/2017 Document Reviewed: 10/26/2016 Elsevier Patient Education  2020 [employer]  

## 2019-04-09 NOTE — Progress Notes (Signed)
Pt is here to have PCP to take a look at her knot on the right wrist.  Pt. Is also having a right shoulder pain. She stated she cannot lift a gallon of milk.    Pt. Stated she had the knot on her wrist before and came back.   Pt. Is requesting refill on Cymbalta.

## 2019-04-20 ENCOUNTER — Ambulatory Visit (INDEPENDENT_AMBULATORY_CARE_PROVIDER_SITE_OTHER): Payer: Self-pay | Admitting: Orthopaedic Surgery

## 2019-04-20 ENCOUNTER — Ambulatory Visit (INDEPENDENT_AMBULATORY_CARE_PROVIDER_SITE_OTHER): Payer: Self-pay

## 2019-04-20 ENCOUNTER — Other Ambulatory Visit: Payer: Self-pay

## 2019-04-20 VITALS — Ht 65.0 in | Wt 262.0 lb

## 2019-04-20 DIAGNOSIS — M67431 Ganglion, right wrist: Secondary | ICD-10-CM

## 2019-04-20 DIAGNOSIS — M7541 Impingement syndrome of right shoulder: Secondary | ICD-10-CM

## 2019-04-20 DIAGNOSIS — M25511 Pain in right shoulder: Secondary | ICD-10-CM

## 2019-04-20 MED ORDER — METHYLPREDNISOLONE ACETATE 40 MG/ML IJ SUSP
40.0000 mg | INTRAMUSCULAR | Status: AC | PRN
  Administered 2019-04-20: 40 mg via INTRA_ARTICULAR

## 2019-04-20 MED ORDER — LIDOCAINE HCL 1 % IJ SOLN
3.0000 mL | INTRAMUSCULAR | Status: AC | PRN
  Administered 2019-04-20: 09:00:00 3 mL

## 2019-04-20 NOTE — Progress Notes (Signed)
Office Visit Note   Patient: Jenna [name]           Date of Birth: 20-Feb-1971           MRN: [id number] Visit Date: 04/20/2019              Requested by: Antony Blackbird, MD [place],  [place] [address] PCP: Antony Blackbird, MD   Assessment & Plan: Visit Diagnoses:  1. Acute pain of right shoulder   2. Impingement syndrome of right shoulder   3. Ganglion cyst of dorsum of right wrist     Plan: She understands fully that she needs to continue to lose weight before we would pursue knee replacement surgery.  I would like to see her back in 4 weeks with a repeat weight and BMI calculation.  I did aspirate the cyst from her right wrist.  It was gelatinous and clear consistent with a ganglion cyst.  This decompressed it fully and she is very pleased.  Also place a steroid injection in her right shoulder subacromial space.  She tolerated this very well.  All question concerns were answered and addressed.  We counseled her once again about weight loss.  She is also going for full body disability.  I do feel that she is a candidate for at least some disability given her weight and the arthritic change in her knees that were required knee replacement surgery.  Hopefully then she would eventually become a productive member of society again and to be able to work.  Follow-Up Instructions: Return in about 4 weeks (around 05/18/2019).   Orders:  Orders Placed This Encounter  Procedures  . Large Joint Inj  . Hand/UE Inj  . XR Shoulder Right   No orders of the defined types were placed in this encounter.     Procedures: Large Joint Inj: R subacromial bursa on 04/20/2019 9:26 AM Indications: pain and diagnostic evaluation Details: 22 G 1.5 in needle  Arthrogram: No  Medications: 3 mL lidocaine 1 %; 40 mg methylPREDNISolone acetate 40 MG/ML Outcome: tolerated well, no immediate complications Procedure, treatment alternatives, risks and benefits explained, specific risks discussed.  Consent was given by the patient. Immediately prior to procedure a time out was called to verify the correct patient, procedure, equipment, support staff and site/side marked as required. Patient was prepped and draped in the usual sterile fashion.   Hand/UE Inj: R wrist for dorsal carpal ganglion on 04/20/2019 9:26 AM      Clinical Data: No additional findings.   Subjective: Chief Complaint  Patient presents with  . Left Knee - Pain  . Right Shoulder - Pain  . Right Wrist - Pain  The patient is a 49 year old female well-known to me.  We have been following her for bilateral knee arthritis with the left much worse than the right.  She is in need of a knee replacement.  However we have been having to monitor closely because her BMI is over 40.  She also comes in today to go to have me assess her right shoulder that is been hurting with overhead activities and reaching behind her has been slowly getting worse with time but no known injury.  She does have decreased strength as well as decreased motion.  She is requesting an injection.  She also needs me look at her right wrist.  She has had a cyst develop on the right wrist on the dorsal aspect.  She has had something similar on her  left side before.  It is not painful to her.  The one on her left side she stated went away but the one on the right side has been persistent.  She denies any numbness and tingling in her hand..  She points to the dorsum of her right wrist of where the cyst is.  Her weight today in the office is 262 with a BMI of 43.  HPI  Review of Systems She currently denies any headache, chest pain, shortness of breath, fever, chills, nausea, vomiting.  Objective: Vital Signs: Ht 5\' 5"  (1.651 m)   Wt 270 lb (122.5 kg)   LMP 04/01/2019   BMI 44.93 kg/m   Physical Exam She is alert and orient x3 and in no acute distress Ortho Exam Examination of her right shoulder does show signs of impingement.  There is no weakness in  the shoulder.  It hurts with reaching overhead and behind her as well as across the front.  She does have positive Neer and Hawkins signs.  The rotator cuff itself feels strong.  Examination of her right wrist does show a large dorsal ganglion.  It is mobile in the soft tissue.  The remainder of her wrist and hand exam is normal.  Examination of her left knee does show medial lateral joint line tenderness with varus malalignment and patellofemoral crepitation. Specialty Comments:  No specialty comments available.  Imaging: No results found.   PMFS History: Patient Active Problem List   Diagnosis Date Noted  . Vitamin D deficiency 12/09/2018  . Class 3 severe obesity due to excess calories with body mass index (BMI) of 45.0 to 49.9 in adult (HCC) 01/17/2017  . Low back pain 05/17/2016  . Primary osteoarthritis of both knees 06/27/2015  . Closed right fibular fracture 08/10/2014   Past Medical History:  Diagnosis Date  . Arthritis   . Back pain   . Chest pain   . Depression   . Edema of both lower extremities   . High blood pressure   . Joint pain   . Osteoarthritis     Family History  Problem Relation Age of Onset  . Diabetes Mother   . Hypertension Mother     Past Surgical History:  Procedure Laterality Date  . CHOLECYSTECTOMY    . TUBAL LIGATION     Social History   Occupational History  . Occupation: Stay at home  Tobacco Use  . Smoking status: Current Every Day Smoker    Packs/day: 0.10    Types: Cigarettes  . Smokeless tobacco: Never Used  Substance and Sexual Activity  . Alcohol use: No    Alcohol/week: 0.0 standard drinks  . Drug use: No  . Sexual activity: Not on file

## 2019-04-21 ENCOUNTER — Telehealth (INDEPENDENT_AMBULATORY_CARE_PROVIDER_SITE_OTHER): Payer: Self-pay | Admitting: Bariatrics

## 2019-04-21 ENCOUNTER — Encounter (INDEPENDENT_AMBULATORY_CARE_PROVIDER_SITE_OTHER): Payer: Self-pay | Admitting: Bariatrics

## 2019-04-21 DIAGNOSIS — Z6841 Body Mass Index (BMI) 40.0 and over, adult: Secondary | ICD-10-CM

## 2019-04-21 DIAGNOSIS — D508 Other iron deficiency anemias: Secondary | ICD-10-CM

## 2019-04-21 DIAGNOSIS — E559 Vitamin D deficiency, unspecified: Secondary | ICD-10-CM

## 2019-04-21 MED ORDER — VITAMIN D (ERGOCALCIFEROL) 1.25 MG (50000 UNIT) PO CAPS: 50000.0000 [IU] | ORAL_CAPSULE | ORAL | 0 refills | Status: DC

## 2019-04-21 MED FILL — ?ERGOCALCIFEROL 50000 UNITC: 1.25 MG | 28 days supply | Qty: 4 | Fill #0

## 2019-04-21 NOTE — Progress Notes (Signed)
TeleHealth Visit:  Due to the COVID-19 pandemic, this visit was completed with telemedicine (audio/video) technology to reduce patient and provider exposure as well as to preserve personal protective equipment.   Jenna [name] has verbally consented to this TeleHealth visit. The patient is located in the car, the provider is located at the [facility] office. The participants in this visit include the listed provider and patient. The visit was conducted today via Webex.  Chief Complaint: OBESITY Jenna [name] is here to discuss her progress with her obesity treatment plan along with follow-up of her obesity related diagnoses. [name] is on the Category 2 Plan and states she is following her eating plan approximately 90% of the time. Giuliana states she is using a workout app 8-10 minutes 2 times per week.  Today's visit was #: 9 Starting weight: 277 lbs Starting date: 12/08/2018  Interim History: Jenna [name] states that her weight remains the same (weight 262).   Subjective:   Vitamin D deficiency. Jenna [name] reports minimal sunlight exposure. Last Vitamin D level 24.8 on 04/02/2019.  Other iron deficiency anemia. Jenna [name] takes Ferrous Sulfate. CBC and iron/anemia profile normal.  CBC Latest Ref Rng & Units 04/02/2019 12/03/2017  WBC 3.4 - 10.8 x10E3/uL 5.6 7.0  Hemoglobin 11.1 - 15.9 g/dL 60.4 10.8(L)  Hematocrit 34.0 - 46.6 % 37.8 34.2  Platelets 150 - 450 x10E3/uL 253 295   Lab Results  Component Value Date   IRON 53 04/02/2019   TIBC 360 04/02/2019   FERRITIN 26 04/02/2019   Lab Results  Component Value Date   VITAMINB12 454 04/02/2019   Assessment/Plan:   Vitamin D deficiency. Low Vitamin D level contributes to fatigue and are associated with obesity, breast, and colon cancer. She was given a prescription for Vitamin D, Ergocalciferol, (DRISDOL) 1.25 MG (50000 UNIT) CAPS capsule every week #4 with 0 refills and will follow-up for routine testing of Vitamin D,  at least 2-3 times per year to avoid over-replacement.        Other iron deficiency anemia. Orders and follow up as documented in patient record. [name] will continue her Ferrous Sulfate as directed.  Counseling . Iron is essential for our bodies to make red blood cells.  Reasons that someone may be deficient include: an iron-deficient diet (more likely in those following vegan or vegetarian diets), women with heavy menses, patients with GI disorders or poor absorption, patients that have had bariatric surgery, frequent blood donors, patients with cancer, and patients with heart disease.   Jenna [name] An iron supplement has been recommended. This is found over-the-counter.  Gaspar Cola foods include dark leafy greens, red and white meats, eggs, seafood, and beans.   . Certain foods and drinks prevent your body from absorbing iron properly. Avoid eating these foods in the same meal as iron-rich foods or with iron supplements. These foods include: coffee, black tea, and red wine; milk, dairy products, and foods that are high in calcium; beans and soybeans; whole grains.  . Constipation can be a side effect of iron supplementation. Increased water and fiber intake are helpful. Water goal: > 2 liters/day. Fiber goal: > 25 grams/day.  Class 3 severe obesity with serious comorbidity and body mass index (BMI) of 40.0 to 44.9 in adult, unspecified obesity type (HCC).  [name] is currently in the action stage of change. As such, her goal is to continue with weight loss efforts. She has agreed to the Category 2 Plan.   She will work on meal planning, intentional  eating, and increasing her water intake.  We independently reviewed labs with the patient including CMP, Vitamin D, B12, and CBC.  Exercise goals: Jenna [name] will continue workout app 8-10 minutes 2 times per week.  Behavioral modification strategies: increasing lean protein intake, decreasing simple carbohydrates, increasing vegetables, increasing water  intake, decreasing eating out, no skipping meals, meal planning and cooking strategies, keeping healthy foods in the home and planning for success.  Jenna [name] has agreed to follow-up with our clinic in 2 weeks. She was informed of the importance of frequent follow-up visits to maximize her success with intensive lifestyle modifications for her multiple health conditions.  Objective:   VITALS: Per patient if applicable, see vitals. GENERAL: Alert and in no acute distress. CARDIOPULMONARY: No increased WOB. Speaking in clear sentences.  PSYCH: Pleasant and cooperative. Speech normal rate and rhythm. Affect is appropriate. Insight and judgement are appropriate. Attention is focused, linear, and appropriate.  NEURO: Oriented as arrived to appointment on time with no prompting.   Lab Results  Component Value Date   CREATININE 0.85 04/02/2019   BUN 8 04/02/2019   NA 142 04/02/2019   K 4.7 04/02/2019   CL 107 (H) 04/02/2019   CO2 22 04/02/2019   Lab Results  Component Value Date   ALT 13 04/02/2019   AST 16 04/02/2019   ALKPHOS 73 04/02/2019   BILITOT 0.4 04/02/2019   Lab Results  Component Value Date   HGBA1C 5.4 12/08/2018   HGBA1C 5.6 12/03/2017   Lab Results  Component Value Date   INSULIN 7.4 12/08/2018   Lab Results  Component Value Date   TSH 1.040 12/08/2018   Lab Results  Component Value Date   CHOL 143 12/08/2018   HDL 47 12/08/2018   LDLCALC 81 12/08/2018   TRIG 79 12/08/2018   Lab Results  Component Value Date   WBC 5.6 04/02/2019   HGB 12.5 04/02/2019   HCT 37.8 04/02/2019   MCV 89 04/02/2019   PLT 253 04/02/2019   Lab Results  Component Value Date   IRON 53 04/02/2019   TIBC 360 04/02/2019   FERRITIN 26 04/02/2019   Attestation Statements:   Reviewed by clinician on day of visit: allergies, medications, problem list, medical history, surgical history, family history, social history, and previous encounter notes.  Migdalia Dk, am acting as  Location manager for [employer], DO   I have reviewed the above documentation for accuracy and completeness, and I agree with the above. Jearld Lesch, DO

## 2019-05-05 ENCOUNTER — Other Ambulatory Visit: Payer: Self-pay

## 2019-05-05 ENCOUNTER — Ambulatory Visit (INDEPENDENT_AMBULATORY_CARE_PROVIDER_SITE_OTHER): Payer: Self-pay | Admitting: Bariatrics

## 2019-05-05 ENCOUNTER — Encounter (INDEPENDENT_AMBULATORY_CARE_PROVIDER_SITE_OTHER): Payer: Self-pay | Admitting: Bariatrics

## 2019-05-05 VITALS — BP 104/65 | HR 85 | Temp 98.9°F | Ht 65.0 in | Wt 264.0 lb

## 2019-05-05 DIAGNOSIS — Z6841 Body Mass Index (BMI) 40.0 and over, adult: Secondary | ICD-10-CM

## 2019-05-05 DIAGNOSIS — M545 Low back pain, unspecified: Secondary | ICD-10-CM

## 2019-05-05 DIAGNOSIS — Z9189 Other specified personal risk factors, not elsewhere classified: Secondary | ICD-10-CM

## 2019-05-05 DIAGNOSIS — F3289 Other specified depressive episodes: Secondary | ICD-10-CM

## 2019-05-05 DIAGNOSIS — E559 Vitamin D deficiency, unspecified: Secondary | ICD-10-CM

## 2019-05-05 MED ORDER — BUPROPION HCL ER (SR) 150 MG PO TB12: 150.0000 mg | ORAL_TABLET | Freq: Every day | ORAL | 0 refills | Status: DC

## 2019-05-05 NOTE — Progress Notes (Signed)
Chief Complaint:   OBESITY Jenna [name] is here to discuss her progress with her obesity treatment plan along with follow-up of her obesity related diagnoses. Jenna [name] is on the Category 2 Plan and states she is following her eating plan approximately 90-95% of the time. Jenna [name] states she is exercising 0 minutes 0 times per week.  Today's visit was #: 10 Starting weight: 277 lbs Starting date: 12/08/2018 Today's weight: 264 lbs Today's date: 05/05/2019 Total lbs lost to date: 13 Total lbs lost since last in-office visit: 0  Interim History: Jenna [name] is up 2 lbs. She has been having back pain, which is improving.  Subjective:   Vitamin D deficiency. Jenna [name] is taking Vitamin D. Last Vitamin D level 24.8 on 04/02/2019.  Low back pain, unspecified back pain laterality, unspecified chronicity, unspecified whether sciatica present. Back pain is improving.  Other depression, with emotional eating. Jenna [name] is struggling with emotional eating and using food for comfort to the extent that it is negatively impacting her health. She has been working on behavior modification techniques to help reduce her emotional eating and has been somewhat successful. She shows no sign of suicidal or homicidal ideations. Jenna [name] reports stress eating at night. No seizures, glaucoma, or kidney stones.  At risk for activity intolerance. Jenna [name] is at risk of exercise intolerance due to back pain.  Assessment/Plan:   Vitamin D deficiency. Low Vitamin D level contributes to fatigue and are associated with obesity, breast, and colon cancer. She agrees to continue to take Vitamin D and will follow-up for routine testing of Vitamin D, at least 2-3 times per year to avoid over-replacement.  Low back pain, unspecified back pain laterality, unspecified chronicity, unspecified whether sciatica present. Jenna [name] will continue medications as directed and will do back stretching exercises.  Other depression,  with emotional eating. Behavior modification techniques were discussed today to help Jenna [name] deal with her emotional/non-hunger eating behaviors.  Orders and follow up as documented in patient record. Jenna [name] was given a prescription for  buPROPion (WELLBUTRIN SR) 150 MG 12 hr tablet 1 PO daily #30 with 0 refills.  At risk for activity intolerance. Jenna [name] was given approximately 15 minutes of exercise intolerance counseling today. She is 49 y.o. female and has risk factors exercise intolerance including obesity. We discussed intensive lifestyle modifications today with an emphasis on specific weight loss instructions and strategies. Jenna [name] will slowly increase activity as tolerated.  Repetitive spaced learning was employed today to elicit superior memory formation and behavioral change.  Class 3 severe obesity with serious comorbidity and body mass index (BMI) of 40.0 to 44.9 in adult, unspecified obesity type (Jenna [name]).  Jenna [name] is currently in the action stage of change. As such, her goal is to continue with weight loss efforts. She has agreed to the Category 2 Plan.   She will work on meal planning, intentional eating, and increasing her water intake.  Exercise goals: Jaziyah will do back stretching exercises.  Behavioral modification strategies: increasing lean protein intake, decreasing simple carbohydrates, increasing vegetables, increasing water intake, decreasing eating out, no skipping meals, meal planning and cooking strategies, keeping healthy foods in the home and planning for success.  Jenna [name] has agreed to follow-up with our clinic in 2 weeks. She was informed of the importance of frequent follow-up visits to maximize her success with intensive lifestyle modifications for her multiple health conditions.   Objective:   Blood pressure 104/65, pulse 85, temperature 98.9 F (37.2 C), temperature source Oral, height 5\' 5"  (1.651 m),  weight 264 lb (119.7 kg), last menstrual period  04/26/2019, SpO2 100 %. Body mass index is 43.93 kg/m.  General: Cooperative, alert, well developed, in no acute distress. HEENT: Conjunctivae and lids unremarkable. Cardiovascular: Regular rhythm.  Lungs: Normal work of breathing. Neurologic: No focal deficits.   Lab Results  Component Value Date   CREATININE 0.85 04/02/2019   BUN 8 04/02/2019   NA 142 04/02/2019   K 4.7 04/02/2019   CL 107 (H) 04/02/2019   CO2 22 04/02/2019   Lab Results  Component Value Date   ALT 13 04/02/2019   AST 16 04/02/2019   ALKPHOS 73 04/02/2019   BILITOT 0.4 04/02/2019   Lab Results  Component Value Date   HGBA1C 5.4 12/08/2018   HGBA1C 5.6 12/03/2017   Lab Results  Component Value Date   INSULIN 7.4 12/08/2018   Lab Results  Component Value Date   TSH 1.040 12/08/2018   Lab Results  Component Value Date   CHOL 143 12/08/2018   HDL 47 12/08/2018   LDLCALC 81 12/08/2018   TRIG 79 12/08/2018   Lab Results  Component Value Date   WBC 5.6 04/02/2019   HGB 12.5 04/02/2019   HCT 37.8 04/02/2019   MCV 89 04/02/2019   PLT 253 04/02/2019   Lab Results  Component Value Date   IRON 53 04/02/2019   TIBC 360 04/02/2019   FERRITIN 26 04/02/2019   Attestation Statements:   Reviewed by clinician on day of visit: allergies, medications, problem list, medical history, surgical history, family history, social history, and previous encounter notes.  Fernanda Drum, am acting as Energy manager for [employer], DO   I have reviewed the above documentation for accuracy and completeness, and I agree with the above. Corinna Capra, DO

## 2019-05-06 MED FILL — BUPROPION SR 150 MG TABLET: 150 | 30 days supply | Qty: 30 | Fill #0

## 2019-05-18 ENCOUNTER — Other Ambulatory Visit: Payer: Self-pay

## 2019-05-18 ENCOUNTER — Ambulatory Visit (INDEPENDENT_AMBULATORY_CARE_PROVIDER_SITE_OTHER): Payer: Self-pay | Admitting: Orthopaedic Surgery

## 2019-05-18 ENCOUNTER — Encounter: Payer: Self-pay | Admitting: Orthopaedic Surgery

## 2019-05-18 VITALS — Wt 272.0 lb

## 2019-05-18 DIAGNOSIS — M7541 Impingement syndrome of right shoulder: Secondary | ICD-10-CM

## 2019-05-18 DIAGNOSIS — Z96611 Presence of right artificial shoulder joint: Secondary | ICD-10-CM

## 2019-05-18 DIAGNOSIS — M67431 Ganglion, right wrist: Secondary | ICD-10-CM

## 2019-05-18 DIAGNOSIS — M1712 Unilateral primary osteoarthritis, left knee: Secondary | ICD-10-CM

## 2019-05-18 DIAGNOSIS — M25511 Pain in right shoulder: Secondary | ICD-10-CM

## 2019-05-18 NOTE — Progress Notes (Signed)
The patient is being seen in follow-up for several things.  She does have a known ganglion cyst on the dorsum of her right wrist.  We have aspirated this before and got gelatinous material consistent with a ganglion cyst.  It is reoccurred.  It is not painful to her at all.  Right now she just wants to watch it.  She has been dealing with right shoulder pain with overhead activities and reaching behind her.  Her x-rays were negative for any acute findings and we did provide a steroid injection in the subacromial outlet.  We put her through shoulder strengthening exercises and that has not helped.  She is still having a lot of pain and weakness in her right shoulder.  We did reweigh her today.  Her weight has increased several pounds and her BMI is up to 45.  She attributes some of this to her menstrual cycle and retaining fluid.  She does have known osteoarthritis of the left knee and would like to consider knee replacement at some point.  She is only 49 years old.  At this point I would like to obtain a MRI of the right shoulder given the severity of her pain with the right shoulder and the failure conservative treatment.  Also given the fact that her right shoulder is weak.  We would like to obtain a MRI to rule out a rotator cuff tear.  I have counseled her still about weight loss.  We see her back at her next visit to go over the MRI of her right shoulder, I would like her weight again and a BMI calculated.  All question concerns were answered and addressed.  As far as the risk goes, she is no leave this alone for now she states.

## 2019-05-19 ENCOUNTER — Encounter (INDEPENDENT_AMBULATORY_CARE_PROVIDER_SITE_OTHER): Payer: Self-pay | Admitting: Bariatrics

## 2019-05-19 ENCOUNTER — Ambulatory Visit (INDEPENDENT_AMBULATORY_CARE_PROVIDER_SITE_OTHER): Payer: Self-pay | Admitting: Bariatrics

## 2019-05-19 VITALS — BP 122/81 | HR 89 | Temp 98.7°F | Ht 65.0 in | Wt 266.0 lb

## 2019-05-19 DIAGNOSIS — F3289 Other specified depressive episodes: Secondary | ICD-10-CM

## 2019-05-19 DIAGNOSIS — Z6841 Body Mass Index (BMI) 40.0 and over, adult: Secondary | ICD-10-CM

## 2019-05-19 DIAGNOSIS — Z9189 Other specified personal risk factors, not elsewhere classified: Secondary | ICD-10-CM

## 2019-05-19 DIAGNOSIS — E559 Vitamin D deficiency, unspecified: Secondary | ICD-10-CM

## 2019-05-19 MED ORDER — BUPROPION HCL ER (SR) 200 MG PO TB12: 200.0000 mg | ORAL_TABLET | Freq: Every day | ORAL | 0 refills | Status: DC

## 2019-05-19 MED FILL — BUPROPION HCL SR 200 MG TAB: 200 | 30 days supply | Qty: 30 | Fill #0

## 2019-05-19 NOTE — Progress Notes (Signed)
Chief Complaint:   OBESITY Jenna [name] is here to discuss her progress with her obesity treatment plan along with follow-up of her obesity related diagnoses. Jenna [name] is on the Category 2 Plan and states she is following her eating plan approximately 90% of the time. Jenna [name] states she is doing squats/leg lifts 7-10 minutes 2 times per week.  Today's visit was #: 11 Starting weight: 277 lbs Starting date: 12/08/2018 Today's weight: 266 lbs Today's date: 05/19/2019 Total lbs lost to date: 11 Total lbs lost since last in-office visit: 0  Interim History: Jenna [name] is up 2 lbs, but has done relatively well overall. She reports doing well with her water and protein intake.  Subjective:   Other depression, with emotional eating. Jenna [name] is struggling with emotional eating and using food for comfort to the extent that it is negatively impacting her health. She has been working on behavior modification techniques to help reduce her emotional eating and has been somewhat successful. She shows no sign of suicidal or homicidal ideations. Jenna [name] reports decreased appetite and cravings.  Vitamin D deficiency. No nausea, vomiting, or muscle weakness. Last Vitamin D 24.8 on 04/02/2019.  At risk for osteoporosis. Jenna [name] is at higher risk of osteopenia and osteoporosis due to Vitamin D deficiency.   Assessment/Plan:   Other depression, with emotional eating. Behavior modification techniques were discussed today to help Jenna [name] deal with her emotional/non-hunger eating behaviors.  Orders and follow up as documented in patient record. We discussed CBT techniques. Jenna [name] was given a prescription for buPROPion (WELLBUTRIN SR) 200 MG 12 hr tablet 1 daily #30 with 0 refills (dose increased from 150 to 200 mg).  Vitamin D deficiency. Low Vitamin D level contributes to fatigue and are associated with obesity, breast, and colon cancer. She agrees to continue to take Vitamin D and will follow-up  for routine testing of Vitamin D, at least 2-3 times per year to avoid over-replacement.  At risk for osteoporosis. Jenna [name] was given approximately 15 minutes of osteoporosis prevention counseling today. Jenna [name] is at risk for osteopenia and osteoporosis due to her Vitamin D deficiency. She was encouraged to take her Vitamin D and follow her higher calcium diet and increase strengthening exercise to help strengthen her bones and decrease her risk of osteopenia and osteoporosis.  Repetitive spaced learning was employed today to elicit superior memory formation and behavioral change.  Class 3 severe obesity with serious comorbidity and body mass index (BMI) of 40.0 to 44.9 in adult, unspecified obesity type (Jenna [name]).  Jenna [name] is currently in the action stage of change. As such, her goal is to continue with weight loss efforts. She has agreed to the Category 2 Plan.   She will work on meal planning and mindful eating.  Exercise goals: Jenna [name] will begin to walk and continue squats and leg lifts.  Behavioral modification strategies: increasing lean protein intake, decreasing simple carbohydrates, increasing vegetables, increasing water intake, decreasing eating out, no skipping meals, meal planning and cooking strategies, keeping healthy foods in the home and planning for success.  Jenna [name] has agreed to follow-up with our clinic in 2 weeks. She was informed of the importance of frequent follow-up visits to maximize her success with intensive lifestyle modifications for her multiple health conditions.   Objective:   Blood pressure 122/81, pulse 89, temperature 98.7 F (37.1 C), height 5\' 5"  (1.651 m), weight 266 lb (120.7 kg), last menstrual period 05/12/2019, SpO2 99 %. Body mass index is 44.26 kg/m.  General: Cooperative, alert, well  developed, in no acute distress. HEENT: Conjunctivae and lids unremarkable. Cardiovascular: Regular rhythm.  Lungs: Normal work of breathing. Neurologic: No  focal deficits.   Lab Results  Component Value Date   CREATININE 0.85 04/02/2019   BUN 8 04/02/2019   NA 142 04/02/2019   K 4.7 04/02/2019   CL 107 (H) 04/02/2019   CO2 22 04/02/2019   Lab Results  Component Value Date   ALT 13 04/02/2019   AST 16 04/02/2019   ALKPHOS 73 04/02/2019   BILITOT 0.4 04/02/2019   Lab Results  Component Value Date   HGBA1C 5.4 12/08/2018   HGBA1C 5.6 12/03/2017   Lab Results  Component Value Date   INSULIN 7.4 12/08/2018   Lab Results  Component Value Date   TSH 1.040 12/08/2018   Lab Results  Component Value Date   CHOL 143 12/08/2018   HDL 47 12/08/2018   LDLCALC 81 12/08/2018   TRIG 79 12/08/2018   Lab Results  Component Value Date   WBC 5.6 04/02/2019   HGB 12.5 04/02/2019   HCT 37.8 04/02/2019   MCV 89 04/02/2019   PLT 253 04/02/2019   Lab Results  Component Value Date   IRON 53 04/02/2019   TIBC 360 04/02/2019   FERRITIN 26 04/02/2019   Attestation Statements:   Reviewed by clinician on day of visit: allergies, medications, problem list, medical history, surgical history, family history, social history, and previous encounter notes.  Fernanda Drum, am acting as Energy manager for [employer], DO   I have reviewed the above documentation for accuracy and completeness, and I agree with the above. Corinna Capra, DO

## 2019-06-01 ENCOUNTER — Ambulatory Visit: Payer: Self-pay | Admitting: Orthopaedic Surgery

## 2019-06-02 ENCOUNTER — Ambulatory Visit (INDEPENDENT_AMBULATORY_CARE_PROVIDER_SITE_OTHER): Payer: Self-pay | Admitting: Bariatrics

## 2019-06-11 ENCOUNTER — Other Ambulatory Visit: Payer: Self-pay

## 2019-06-11 ENCOUNTER — Ambulatory Visit
Admission: RE | Admit: 2019-06-11 | Discharge: 2019-06-11 | Disposition: A | Discharge status: Home / Self Care | Payer: No Typology Code available for payment source | Source: Ambulatory Visit | Attending: Orthopaedic Surgery | Admitting: Orthopaedic Surgery

## 2019-06-11 DIAGNOSIS — Z96611 Presence of right artificial shoulder joint: Secondary | ICD-10-CM

## 2019-06-11 IMAGING — MR MR SHOULDER*R* W/O CM
4 of 5 series · 21 of 40 positions shown · non-contrast
Comparison: Right shoulder x-rays dated [DATE].

CLINICAL DATA: Chronic right shoulder pain and decreased range of
motion for the past 4 months. No prior surgery.

EXAM:
MRI OF THE RIGHT SHOULDER WITHOUT CONTRAST
TECHNIQUE: Multiplanar, multisequence MR imaging of the shoulder was performed.
No intravenous contrast was administered.

[Series 6: PD fat-sat · axial · right · 4.0mm · 0.44mm/px · z∈[-28,+63]mm · 8 of 20 slices shown (1 of 2)]
[im 1/20]
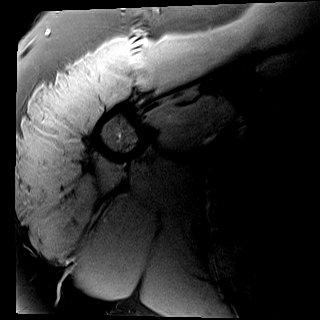
[im 3/20]
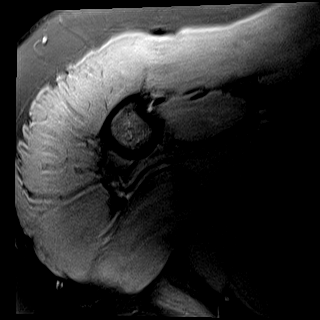
[im 6/20]
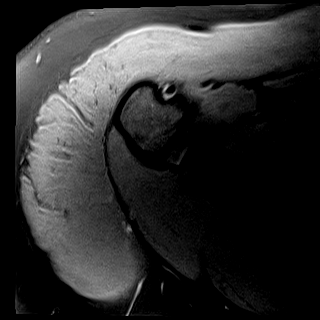
[im 9/20]
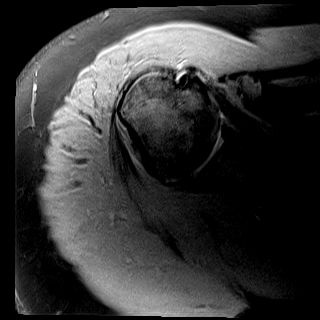
[im 11/20]
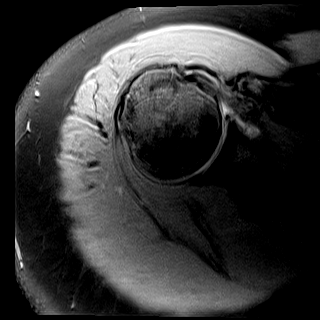
[im 14/20]
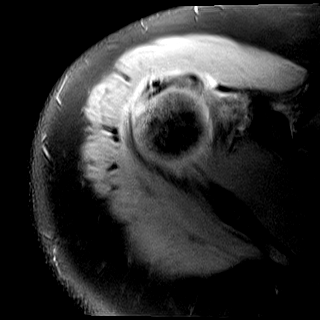
[im 17/20]
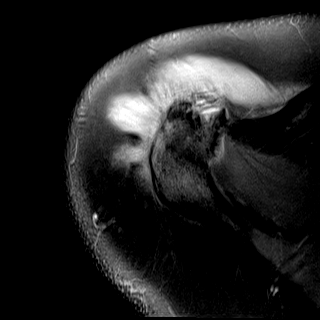
[im 20/20]
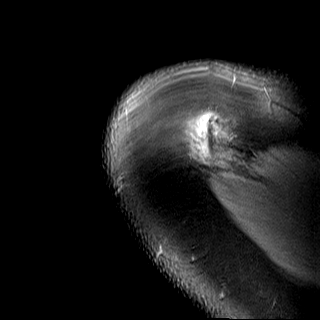

[Series 7: T2 fat-sat · oblique · right · 4.0mm · 0.22mm/px · 3 of 24 slices shown (1 of 2)]
[im 4/24]
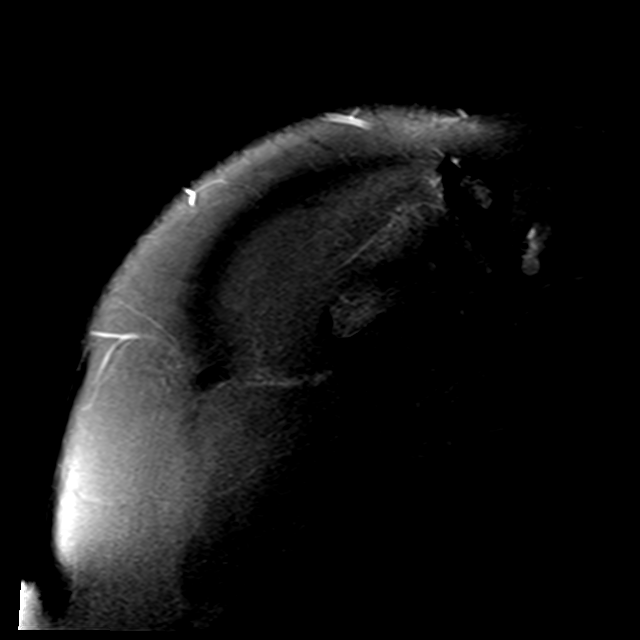
[im 14/24]
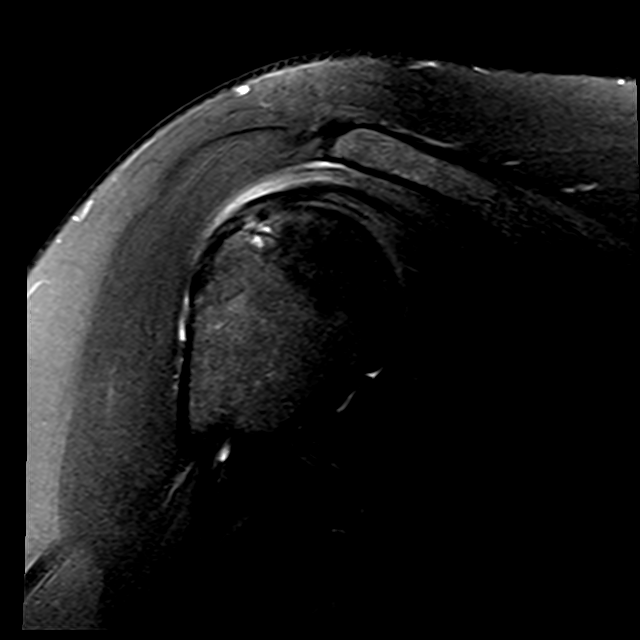
[im 20/24]
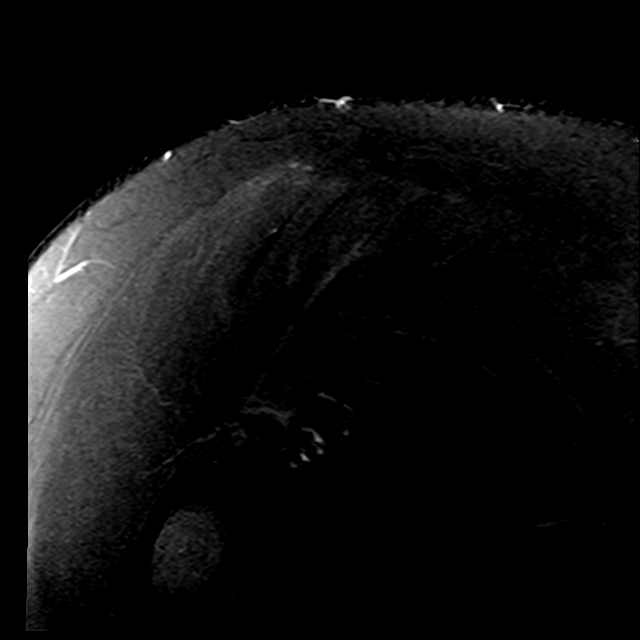

[Series 8: PD fat-sat · oblique · right · 4.0mm · 0.22mm/px · 7 of 24 slices shown (2 of 2)]
[im 1/24]
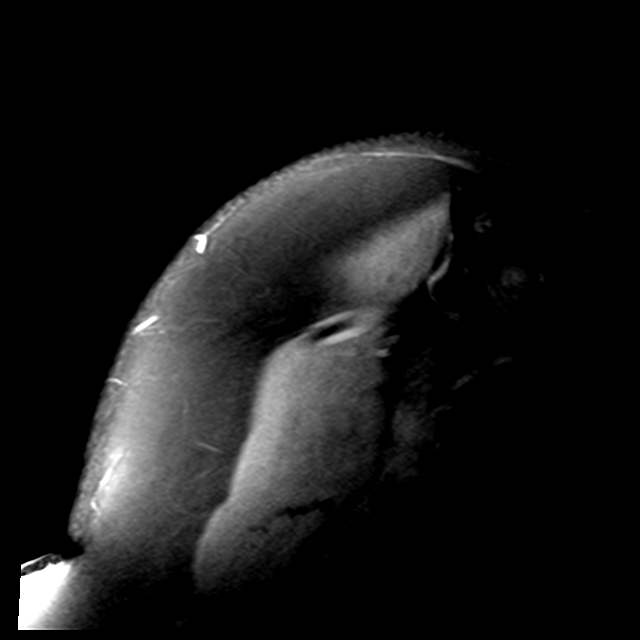
[im 4/24]
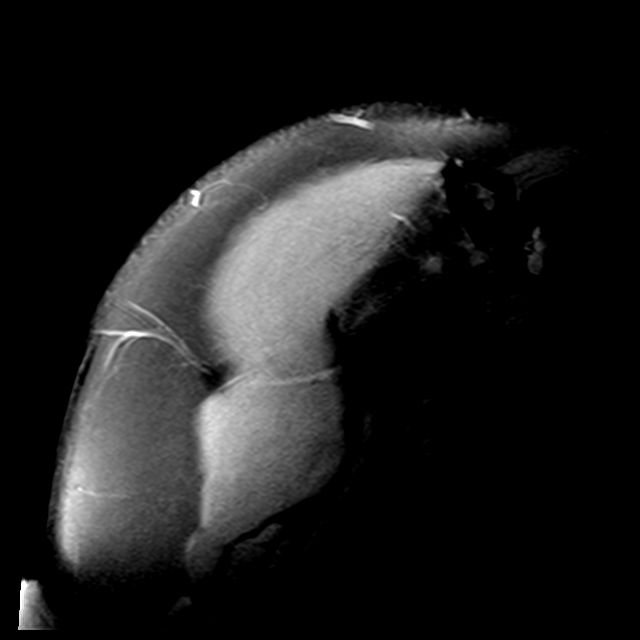
[im 7/24]
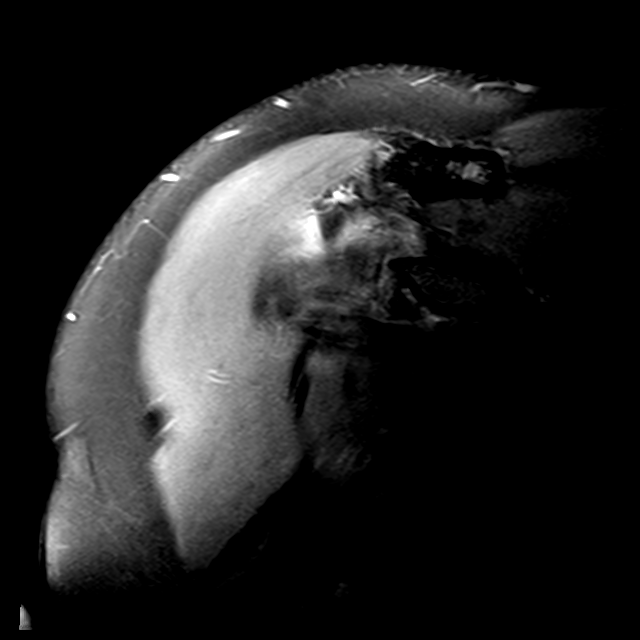
[im 10/24]
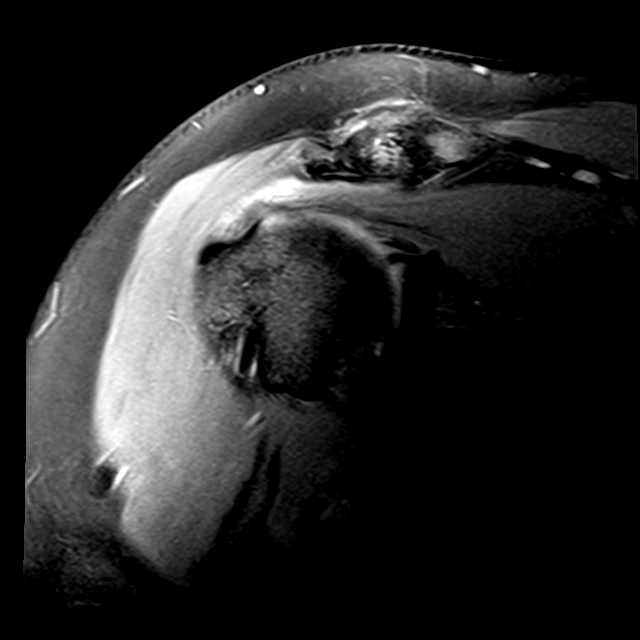
[im 14/24]
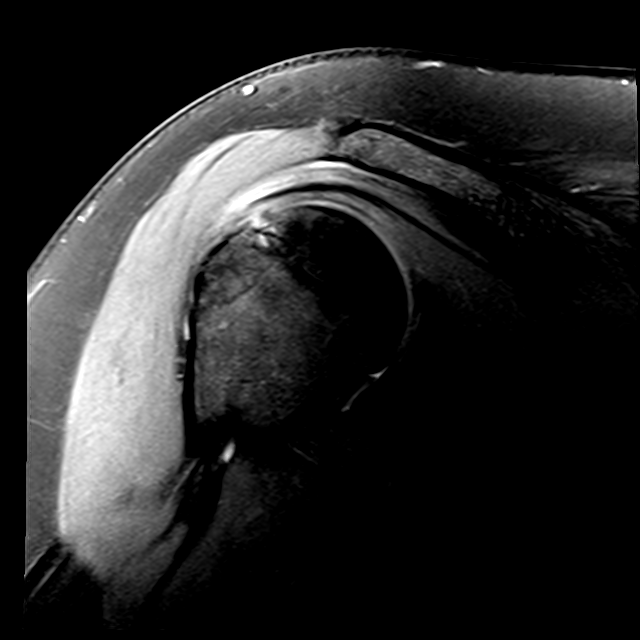
[im 17/24]
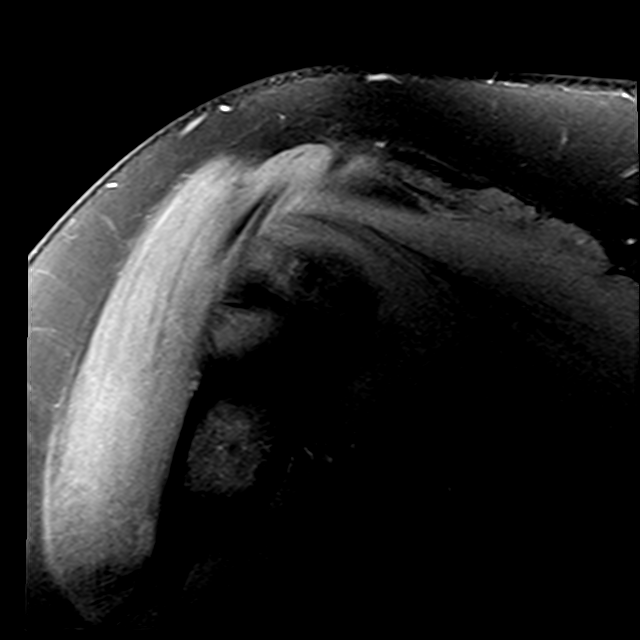
[im 20/24]
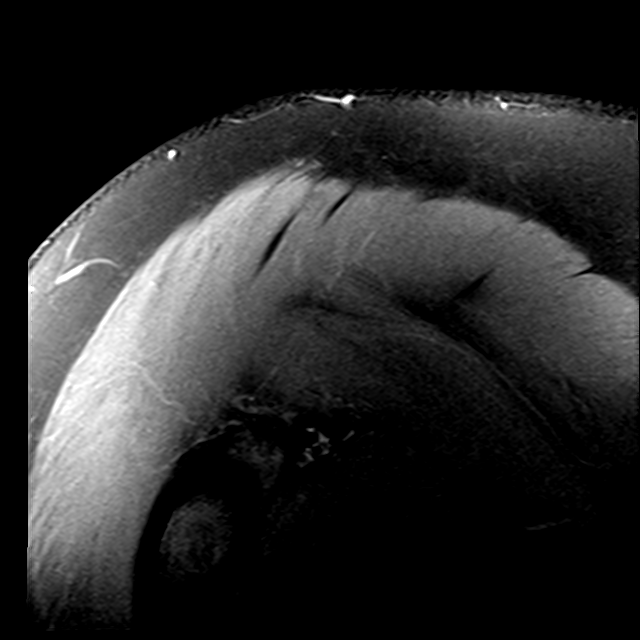

[Series 9: T2 fat-sat · oblique · right · 4.0mm · 0.44mm/px · 3 of 23 slices shown (2 of 2)]
[im 4/23]
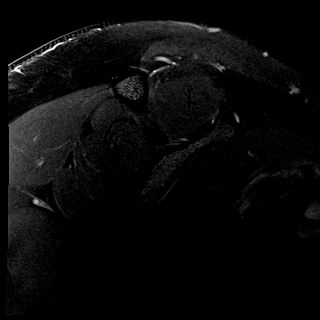
[im 13/23]
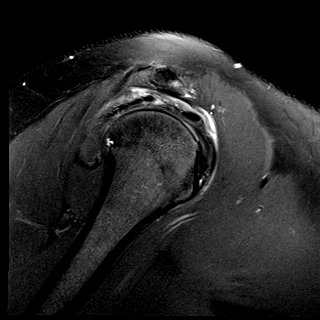
[im 19/23]
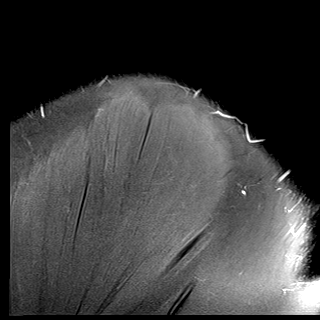

[21 of 40 positions shown; findings below may reference images not displayed]

FINDINGS: Rotator cuff: Mild to moderate supraspinatus and infraspinatus
tendinosis with bursal surface fraying. Mild subscapularis
tendinosis with tiny intrasubstance tears of the proximal and mid
tendon. The teres minor tendon is unremarkable.

Muscles: No atrophy or abnormal signal of the muscles of the rotator
cuff.

Biceps long head: Severe intra-articular tendinosis. Intact and
normally positioned.

Acromioclavicular Joint: Moderate arthropathy of the
acromioclavicular joint. Type I acromion. Trace fluid in the
subacromial/subdeltoid bursa.

Glenohumeral Joint: No joint effusion. No chondral defect.

Labrum: Grossly intact, but evaluation is limited by lack of
intraarticular fluid.

Bones:  No marrow abnormality, fracture or dislocation.

Other: None.
IMPRESSION: 1. Mild to moderate supraspinatus and infraspinatus tendinosis with
bursal surface fraying.
2. Mild subscapularis tendinosis with tiny intrasubstance tears of
the proximal and mid tendon.
3. Severe intra-articular biceps tendinosis.
4. Moderate acromioclavicular osteoarthritis.

## 2019-06-16 ENCOUNTER — Encounter: Payer: Self-pay | Admitting: Orthopaedic Surgery

## 2019-06-16 ENCOUNTER — Other Ambulatory Visit: Payer: Self-pay

## 2019-06-16 ENCOUNTER — Ambulatory Visit (INDEPENDENT_AMBULATORY_CARE_PROVIDER_SITE_OTHER): Payer: Self-pay | Admitting: Orthopaedic Surgery

## 2019-06-16 VITALS — Ht 65.5 in | Wt 271.0 lb

## 2019-06-16 DIAGNOSIS — M1712 Unilateral primary osteoarthritis, left knee: Secondary | ICD-10-CM

## 2019-06-16 DIAGNOSIS — M7541 Impingement syndrome of right shoulder: Secondary | ICD-10-CM

## 2019-06-16 NOTE — Progress Notes (Signed)
Office Visit Note   Patient: Jenna [name]           Date of Birth: [birth year]           MRN: [id number] Visit Date: 06/16/2019              Requested by: Antony Blackbird, MD [place],  [place] [id number] PCP: Antony Blackbird, MD   Assessment & Plan: Visit Diagnoses:  1. Impingement syndrome of right shoulder   2. Unilateral primary osteoarthritis, left knee     Plan: Discussed options for treatment of her right shoulder these include shoulder arthroscopy with extensive debridement versus intra-articular injection of the shoulder.  Patient does not want to proceed with any type of surgery for her shoulder.  She in fact wants to have a left total knee arthroplasty.  However due to her weight and BMI of 45.1 we cannot proceed with surgery.  Discussed with her that her weight would need to be 237 or lower to have a BMI that is acceptable for knee replacement.  Discussed with her quad strengthening exercises and knee friendly exercises.  We will have her undergo an intra-articular injection of the right shoulder with Dr. Junius Roads.  She will follow-up with [place] in a month like a repeat weight and height at that time.  Questions encouraged and answered  Follow-Up Instructions: No follow-ups on file.   Orders:  No orders of the defined types were placed in this encounter.  No orders of the defined types were placed in this encounter.     Procedures: No procedures performed   Clinical Data: No additional findings.   Subjective: Chief Complaint  Patient presents with  . Right Shoulder - Follow-up    HPI [name] comes in today to discuss her right shoulder MRI.  She continues to have right shoulder pain.  She had a subacromial injection without any real relief.  MRI is reviewed with patient.  Right shoulder MRI dated 06/11/2019 showed mild to moderate supra spinatus and infraspinatus tendinosis with some intrasubstance tearing of the subscapularis tendon.  Severe  intra-articular biceps tendinosis.  AC joint with moderate degenerative changes.  Type I acromion. Patient also ask about left knee replacement.  She has severe arthritis of the left knee and has significant pain.  She states this is more painful than her shoulder.She has been trying to lose weight and is on Wellbutrin being used as a appetite suppressant.  Review of Systems See HPI otherwise negative  Objective: Vital Signs: Height 5 foot 5-1/2 inches weight 271 BMI 45.1  Physical Exam Constitutional:      Appearance: She is not ill-appearing or diaphoretic.  Pulmonary:     Effort: Pulmonary effort is normal.  Neurological:     Mental Status: She is alert and oriented to person, place, and time.  Psychiatric:        Mood and Affect: Mood normal.     Ortho Exam Right shoulder full forward flexion 5 and 5 strength in external/internal rotation.  Positive impingement testing.  Crossover test positive on the right.  Empty can test is negative.  Tenderness over the bicipital groove region with palpation.  No tenderness over the biceps muscle belly.  Resisted rotation of the forearm causes pain up into her shoulder.  Left knee good range of motion.  No abnormal warmth erythema.  Tenderness along medial joint line. Specialty Comments:  No specialty comments available.  Imaging: No results found.   PMFS History: Patient  Active Problem List   Diagnosis Date Noted  . Vitamin D deficiency 12/09/2018  . Morbid obesity (HCC) 01/17/2017  . Low back pain 05/17/2016  . Primary osteoarthritis of both knees 06/27/2015  . Closed right fibular fracture 08/10/2014   Past Medical History:  Diagnosis Date  . Arthritis   . Back pain   . Chest pain   . Depression   . Edema of both lower extremities   . High blood pressure   . Joint pain   . Osteoarthritis     Family History  Problem Relation Age of Onset  . Diabetes Mother   . Hypertension Mother     Past Surgical History:    Procedure Laterality Date  . CHOLECYSTECTOMY    . TUBAL LIGATION     Social History   Occupational History  . Occupation: Stay at home  Tobacco Use  . Smoking status: Current Every Day Smoker    Packs/day: 0.10    Types: Cigarettes  . Smokeless tobacco: Never Used  Substance and Sexual Activity  . Alcohol use: No    Alcohol/week: 0.0 standard drinks  . Drug use: No  . Sexual activity: Not on file

## 2019-06-17 ENCOUNTER — Ambulatory Visit (INDEPENDENT_AMBULATORY_CARE_PROVIDER_SITE_OTHER): Payer: Self-pay | Admitting: Family Medicine

## 2019-06-17 ENCOUNTER — Ambulatory Visit: Payer: Self-pay

## 2019-06-17 ENCOUNTER — Encounter: Payer: Self-pay | Admitting: Family Medicine

## 2019-06-17 DIAGNOSIS — M7541 Impingement syndrome of right shoulder: Secondary | ICD-10-CM

## 2019-06-17 NOTE — Progress Notes (Signed)
Subjective: Patient is here for ultrasound-guided intra-articular right glenohumeral injection.  She has rotator cuff tendinopathy.  Objective:  Pain with active ROM.  Procedure: Ultrasound-guided right glenohumeral injection: After sterile prep with Betadine, injected 8 cc 1% lidocaine without epinephrine and 40 mg methylprednisolone using a 22-gauge spinal needle, passing the needle through approach into the glenohumeral joint.  Injectate seen filling joint capsule.  Very good immediate relief.

## 2019-06-18 ENCOUNTER — Other Ambulatory Visit: Payer: Self-pay

## 2019-06-18 ENCOUNTER — Ambulatory Visit (INDEPENDENT_AMBULATORY_CARE_PROVIDER_SITE_OTHER): Payer: Self-pay | Admitting: Bariatrics

## 2019-06-18 ENCOUNTER — Encounter (INDEPENDENT_AMBULATORY_CARE_PROVIDER_SITE_OTHER): Payer: Self-pay | Admitting: Bariatrics

## 2019-06-18 VITALS — BP 124/79 | HR 93 | Temp 98.2°F | Ht 65.0 in | Wt 268.0 lb

## 2019-06-18 DIAGNOSIS — F3289 Other specified depressive episodes: Secondary | ICD-10-CM

## 2019-06-18 DIAGNOSIS — Z6841 Body Mass Index (BMI) 40.0 and over, adult: Secondary | ICD-10-CM

## 2019-06-18 DIAGNOSIS — E559 Vitamin D deficiency, unspecified: Secondary | ICD-10-CM

## 2019-06-18 DIAGNOSIS — Z9189 Other specified personal risk factors, not elsewhere classified: Secondary | ICD-10-CM

## 2019-06-18 MED ORDER — VITAMIN D (ERGOCALCIFEROL) 1.25 MG (50000 UNIT) PO CAPS: 50000.0000 [IU] | ORAL_CAPSULE | ORAL | 0 refills | Status: DC

## 2019-06-18 MED FILL — ?ERGOCALCIFEROL 50000 UNITC: 1.25 MG | 28 days supply | Qty: 4 | Fill #0

## 2019-06-18 NOTE — Progress Notes (Signed)
Chief Complaint:   OBESITY Jenna [name] is here to discuss her progress with her obesity treatment plan along with follow-up of her obesity related diagnoses. Jenna [name] is on the Category 2 Plan and states she is following her eating plan approximately 90-95% of the time. Jenna [name] states she is doing leg lifts/walking 20 minutes 2-3 times per week.  Today's visit was #: 12 Starting weight: 277 lbs Starting date: 12/08/2018 Today's weight: 268 lbs  Today's date: 06/18/2019 Total lbs lost to date: 9 Total lbs lost since last in-office visit: 0  Interim History: Jenna [name] is up 2 lbs.  Subjective:   Vitamin D deficiency. No nausea, vomiting, or muscle weakness. Last Vitamin D 24.8 on 04/02/2019.  Other depression, with emotional eating. Jenna [name] is struggling with emotional eating and using food for comfort to the extent that it is negatively impacting her health. She has been working on behavior modification techniques to help reduce her emotional eating and has been somewhat successful. She shows no sign of suicidal or homicidal ideations. Jenna [name] is taking Wellbutrin.  At risk for osteoporosis. Jenna [name] is at higher risk of osteopenia and osteoporosis due to Vitamin D deficiency.   Assessment/Plan:   Vitamin D deficiency. Low Vitamin D level contributes to fatigue and are associated with obesity, breast, and colon cancer. She was given a prescription for Vitamin D, Ergocalciferol, (DRISDOL) 1.25 MG (50000 UNIT) CAPS capsule every week #4 with 0 refills and will follow-up for routine testing of Vitamin D, at least 2-3 times per year to avoid over-replacement.      Other depression, with emotional eating. Behavior modification techniques were discussed today to help Jenna [name] deal with her emotional/non-hunger eating behaviors.  Orders and follow up as documented in patient record. She will continue Wellbutrin as directed.  At risk for osteoporosis. Jenna [name] was given approximately  15 minutes of osteoporosis prevention counseling today. Jenna [name] is at risk for osteopenia and osteoporosis due to her Vitamin D deficiency. She was encouraged to take her Vitamin D and follow her higher calcium diet and increase strengthening exercise to help strengthen her bones and decrease her risk of osteopenia and osteoporosis.  Repetitive spaced learning was employed today to elicit superior memory formation and behavioral change.  Class 3 severe obesity with serious comorbidity and body mass index (BMI) of 40.0 to 44.9 in adult, unspecified obesity type (HCC).  Jenna [name] is currently in the action stage of change. As such, her goal is to continue with weight loss efforts. She has agreed to the Category 1 Plan.   She will work on meal planning and mindful eating. She was changed to the Category 1 plan today.  Exercise goals: Jenna [name] will continue her current exercise regimen.  Behavioral modification strategies: increasing lean protein intake, decreasing simple carbohydrates, increasing vegetables, increasing water intake, decreasing eating out, no skipping meals, meal planning and cooking strategies, keeping healthy foods in the home, ways to avoid boredom eating, ways to avoid night time snacking, better snacking choices, emotional eating strategies and planning for success.  Jenna [name] has agreed to follow-up with our clinic in 2 weeks. She was informed of the importance of frequent follow-up visits to maximize her success with intensive lifestyle modifications for her multiple health conditions.   Objective:   Blood pressure 124/79, pulse 93, temperature 98.2 F (36.8 C), height 5\' 5"  (1.651 m), weight 268 lb (121.6 kg), last menstrual period 06/15/2019, SpO2 99 %. Body mass index is 44.6 kg/m.  General: Cooperative, alert, well developed, in  no acute distress. HEENT: Conjunctivae and lids unremarkable. Cardiovascular: Regular rhythm.  Lungs: Normal work of breathing. Neurologic:  No focal deficits.   Lab Results  Component Value Date   CREATININE 0.85 04/02/2019   BUN 8 04/02/2019   NA 142 04/02/2019   K 4.7 04/02/2019   CL 107 (H) 04/02/2019   CO2 22 04/02/2019   Lab Results  Component Value Date   ALT 13 04/02/2019   AST 16 04/02/2019   ALKPHOS 73 04/02/2019   BILITOT 0.4 04/02/2019   Lab Results  Component Value Date   HGBA1C 5.4 12/08/2018   HGBA1C 5.6 12/03/2017   Lab Results  Component Value Date   INSULIN 7.4 12/08/2018   Lab Results  Component Value Date   TSH 1.040 12/08/2018   Lab Results  Component Value Date   CHOL 143 12/08/2018   HDL 47 12/08/2018   LDLCALC 81 12/08/2018   TRIG 79 12/08/2018   Lab Results  Component Value Date   WBC 5.6 04/02/2019   HGB 12.5 04/02/2019   HCT 37.8 04/02/2019   MCV 89 04/02/2019   PLT 253 04/02/2019   Lab Results  Component Value Date   IRON 53 04/02/2019   TIBC 360 04/02/2019   FERRITIN 26 04/02/2019   Attestation Statements:   Reviewed by clinician on day of visit: allergies, medications, problem list, medical history, surgical history, family history, social history, and previous encounter notes.  Migdalia Dk, am acting as Location manager for [employer], DO   I have reviewed the above documentation for accuracy and completeness, and I agree with the above. Jearld Lesch, DO

## 2019-06-21 ENCOUNTER — Encounter: Payer: Self-pay | Admitting: Family Medicine

## 2019-07-07 ENCOUNTER — Other Ambulatory Visit: Payer: Self-pay

## 2019-07-07 ENCOUNTER — Encounter (INDEPENDENT_AMBULATORY_CARE_PROVIDER_SITE_OTHER): Payer: Self-pay | Admitting: Bariatrics

## 2019-07-07 ENCOUNTER — Ambulatory Visit (INDEPENDENT_AMBULATORY_CARE_PROVIDER_SITE_OTHER): Payer: Self-pay | Admitting: Bariatrics

## 2019-07-07 VITALS — BP 119/82 | HR 79 | Temp 98.2°F | Ht 65.0 in | Wt 270.0 lb

## 2019-07-07 DIAGNOSIS — Z9189 Other specified personal risk factors, not elsewhere classified: Secondary | ICD-10-CM

## 2019-07-07 DIAGNOSIS — Z6841 Body Mass Index (BMI) 40.0 and over, adult: Secondary | ICD-10-CM

## 2019-07-07 DIAGNOSIS — F3289 Other specified depressive episodes: Secondary | ICD-10-CM

## 2019-07-07 DIAGNOSIS — D508 Other iron deficiency anemias: Secondary | ICD-10-CM

## 2019-07-07 MED ORDER — BUPROPION HCL ER (SR) 200 MG PO TB12: 200.0000 mg | ORAL_TABLET | Freq: Every day | ORAL | 0 refills | Status: DC

## 2019-07-07 MED FILL — BUPROPION HCL SR 200 MG TAB: 200 | 30 days supply | Qty: 30 | Fill #0

## 2019-07-07 NOTE — Progress Notes (Signed)
Chief Complaint:   OBESITY Jenna [name] is here to discuss her progress with her obesity treatment plan along with follow-up of her obesity related diagnoses. Jenna [name] is on the Category 1 Plan and states she is following her eating plan approximately 90% of the time. Jenna [name] states she is walking/squats/leg lifts 15 minutes 2-3 times per week.  Today's visit was #: 31 Starting weight: 277 lbs Starting date: 12/08/2018 Today's weight: 270 lbs Today's date: 07/07/2019 Total lbs lost to date: 7 Total lbs lost since last in-office visit: 0  Interim History: Jenna [name] is up 2 lbs.  Subjective:   Other depression, with emotional eating. Jenna [name] is struggling with emotional eating and using food for comfort to the extent that it is negatively impacting her health. She has been working on behavior modification techniques to help reduce her emotional eating and has been somewhat successful. She shows no sign of suicidal or homicidal ideations.  Other iron deficiency anemia. Jenna [name] is taking iron supplementation.  CBC Latest Ref Rng & Units 04/02/2019 12/03/2017  WBC 3.4 - 10.8 x10E3/uL 5.6 7.0  Hemoglobin 11.1 - 15.9 g/dL 12.5 10.8(L)  Hematocrit 34.0 - 46.6 % 37.8 34.2  Platelets 150 - 450 x10E3/uL 253 295   Lab Results  Component Value Date   IRON 53 04/02/2019   TIBC 360 04/02/2019   FERRITIN 26 04/02/2019   Lab Results  Component Value Date   FYBOFBPZ02 585 04/02/2019   At risk for activity intolerance. Jenna [name] is at risk of exercise intolerance due to iron deficiency anemia.  Assessment/Plan:   Other depression, with emotional eating. Behavior modification techniques were discussed today to help Jenna [name] deal with her emotional/non-hunger eating behaviors.  Orders and follow up as documented in patient record. Jenna [name] was given a prescription for buPROPion (WELLBUTRIN SR) 200 MG 12 hr tablet 1 PO daily #30 with 0 refills.  Other iron deficiency anemia. Orders and  follow up as documented in patient record. Jenna [name] will continue her iron as directed.  Counseling . Iron is essential for our bodies to make red blood cells.  Reasons that someone may be deficient include: an iron-deficient diet (more likely in those following vegan or vegetarian diets), women with heavy menses, patients with GI disorders or poor absorption, patients that have had bariatric surgery, frequent blood donors, patients with cancer, and patients with heart disease.   Jenna [name] Kitchen An iron supplement has been recommended. This is found over-the-counter.  Jenna [name] foods include dark leafy greens, red and white meats, eggs, seafood, and beans.   . Certain foods and drinks prevent your body from absorbing iron properly. Avoid eating these foods in the same meal as iron-rich foods or with iron supplements. These foods include: coffee, black tea, and red wine; milk, dairy products, and foods that are high in calcium; beans and soybeans; whole grains.  . Constipation can be a side effect of iron supplementation. Increased water and fiber intake are helpful. Water goal: > 2 liters/day. Fiber goal: > 25 grams/day.  At risk for activity intolerance. Jenna [name] was given approximately 15 minutes of exercise intolerance counseling today. She is 49 y.o. female and has risk factors exercise intolerance including obesity. We discussed intensive lifestyle modifications today with an emphasis on specific weight loss instructions and strategies. Jenna [name] will slowly increase activity as tolerated.  Repetitive spaced learning was employed today to elicit superior memory formation and behavioral change.  Class 3 severe obesity with serious comorbidity and body mass index (BMI) of 40.0 to  44.9 in adult, unspecified obesity type (HCC).  Jenna [name] is currently in the action stage of change. As such, her goal is to continue with weight loss efforts. She has agreed to the Category 1 Plan.   She will work on meal planning,  intentional eating, and increasing her water intake.  Exercise goals: Jenna [name] will continue her activity as above.  Behavioral modification strategies: increasing lean protein intake, decreasing simple carbohydrates, increasing vegetables, increasing water intake, decreasing eating out, no skipping meals, meal planning and cooking strategies, keeping healthy foods in the home and planning for success.  Annalise has agreed to follow-up with our clinic in 2 weeks. She was informed of the importance of frequent follow-up visits to maximize her success with intensive lifestyle modifications for her multiple health conditions.   Objective:   Blood pressure 119/82, pulse 79, temperature 98.2 F (36.8 C), height 5\' 5"  (1.651 m), weight 270 lb (122.5 kg), last menstrual period 06/15/2019, SpO2 100 %. Body mass index is 44.93 kg/m.  General: Cooperative, alert, well developed, in no acute distress. HEENT: Conjunctivae and lids unremarkable. Cardiovascular: Regular rhythm.  Lungs: Normal work of breathing. Neurologic: No focal deficits.   Lab Results  Component Value Date   CREATININE 0.85 04/02/2019   BUN 8 04/02/2019   NA 142 04/02/2019   K 4.7 04/02/2019   CL 107 (H) 04/02/2019   CO2 22 04/02/2019   Lab Results  Component Value Date   ALT 13 04/02/2019   AST 16 04/02/2019   ALKPHOS 73 04/02/2019   BILITOT 0.4 04/02/2019   Lab Results  Component Value Date   HGBA1C 5.4 12/08/2018   HGBA1C 5.6 12/03/2017   Lab Results  Component Value Date   INSULIN 7.4 12/08/2018   Lab Results  Component Value Date   TSH 1.040 12/08/2018   Lab Results  Component Value Date   CHOL 143 12/08/2018   HDL 47 12/08/2018   LDLCALC 81 12/08/2018   TRIG 79 12/08/2018   Lab Results  Component Value Date   WBC 5.6 04/02/2019   HGB 12.5 04/02/2019   HCT 37.8 04/02/2019   MCV 89 04/02/2019   PLT 253 04/02/2019   Lab Results  Component Value Date   IRON 53 04/02/2019   TIBC 360  04/02/2019   FERRITIN 26 04/02/2019   Attestation Statements:   Reviewed by clinician on day of visit: allergies, medications, problem list, medical history, surgical history, family history, social history, and previous encounter notes.  04/04/2019, am acting as Fernanda Drum for Energy manager, DO   I have reviewed the above documentation for accuracy and completeness, and I agree with the above. [employer], DO

## 2019-07-14 ENCOUNTER — Encounter: Payer: Self-pay | Admitting: Orthopaedic Surgery

## 2019-07-14 ENCOUNTER — Ambulatory Visit (INDEPENDENT_AMBULATORY_CARE_PROVIDER_SITE_OTHER): Payer: Self-pay | Admitting: Orthopaedic Surgery

## 2019-07-14 ENCOUNTER — Other Ambulatory Visit: Payer: Self-pay

## 2019-07-14 VITALS — Ht 65.0 in | Wt 276.2 lb

## 2019-07-14 DIAGNOSIS — M25511 Pain in right shoulder: Secondary | ICD-10-CM

## 2019-07-14 DIAGNOSIS — M1712 Unilateral primary osteoarthritis, left knee: Secondary | ICD-10-CM

## 2019-07-14 MED FILL — ?ERGOCALCIFEROL 50000 UNITC: 1.25 MG | 28 days supply | Qty: 4 | Fill #0

## 2019-07-14 NOTE — Progress Notes (Signed)
Ms. Cardy returns today mainly for weight check.  Again she has end-stage arthritis left knee.  She is working with weight management she states she is changed her diet and is exercising daily.  In regards to her right shoulder she underwent an intra-articular injection with Dr. Prince Rome due to rotator cuff tendinopathy and severe intra-articular biceps tendinosis.  She states that the injection really helped her shoulder she is able to move the shoulder about really with minimal pain.  She is happy with the results.  Physical exam: Height 5 foot 5 weight 276.2 pounds BMI 45.96 Shoulder: Good range of motion of the right shoulder without pain.  Full overhead activity and internal and external rotation right shoulder.  Impression: Right shoulder tendinosis improved Left knee end-stage arthritis Obesity  Plan: She will continue to work on weight loss.  See her back in 6 weeks for height and weight check.  Questions were encouraged and answered.

## 2019-07-21 ENCOUNTER — Ambulatory Visit: Payer: Self-pay

## 2019-07-21 ENCOUNTER — Other Ambulatory Visit: Payer: Self-pay

## 2019-07-28 ENCOUNTER — Ambulatory Visit (INDEPENDENT_AMBULATORY_CARE_PROVIDER_SITE_OTHER): Payer: Self-pay | Admitting: Family Medicine

## 2019-07-28 ENCOUNTER — Encounter (INDEPENDENT_AMBULATORY_CARE_PROVIDER_SITE_OTHER): Payer: Self-pay | Admitting: Family Medicine

## 2019-07-28 ENCOUNTER — Other Ambulatory Visit: Payer: Self-pay

## 2019-07-28 ENCOUNTER — Ambulatory Visit (INDEPENDENT_AMBULATORY_CARE_PROVIDER_SITE_OTHER): Payer: Self-pay | Admitting: Bariatrics

## 2019-07-28 VITALS — BP 111/74 | HR 80 | Temp 98.5°F | Ht 65.0 in | Wt 270.0 lb

## 2019-07-28 DIAGNOSIS — F3289 Other specified depressive episodes: Secondary | ICD-10-CM

## 2019-07-28 DIAGNOSIS — Z6841 Body Mass Index (BMI) 40.0 and over, adult: Secondary | ICD-10-CM

## 2019-07-28 DIAGNOSIS — E88819 Insulin resistance, unspecified: Secondary | ICD-10-CM | POA: Insufficient documentation

## 2019-07-28 DIAGNOSIS — F32A Depression, unspecified: Secondary | ICD-10-CM | POA: Insufficient documentation

## 2019-07-28 DIAGNOSIS — E8881 Metabolic syndrome: Secondary | ICD-10-CM

## 2019-07-28 DIAGNOSIS — E559 Vitamin D deficiency, unspecified: Secondary | ICD-10-CM

## 2019-07-28 NOTE — Progress Notes (Signed)
Chief Complaint:   OBESITY Jenna [name] is here to discuss her progress with her obesity treatment plan along with follow-up of her obesity related diagnoses. Jenna [name] is on the Category 1 Plan and states she is following her eating plan approximately 100% of the time. Jenna [name] states she is walking, bike pedaling, and using an exercise app for 30-45 minutes 7 times per week.  Today's visit was #: 14 Starting weight: 277 lbs Starting date: 12/08/2018 Today's weight: 270 lbs Today's date: 07/28/2019 Total lbs lost to date: 7 Total lbs lost since last in-office visit: 0  Interim History: Jenna [name] is on Category 1, but she is replacing some meals with shakes. She just finished her menses and feels she is retaining water. She is up 2 lbs of water per bioimpedance scale.  She was switched to Category 1 at her last office visit. She initially thought she was adhering to this 100% but as we talked she realized she is going over on her snack calories. She is also having some potatoes at supper.   Subjective:   1. Insulin resistance Jenna [name] denies polyphagia, and she is not on metformin. Jenna [name] has a diagnosis of insulin resistance based on her elevated fasting insulin level >5. She continues to work on diet and exercise to decrease her risk of diabetes. Jenna [name] denies polyphagia, and she is not on metformin.  Lab Results  Component Value Date   INSULIN 7.4 12/08/2018   Lab Results  Component Value Date   HGBA1C 5.4 12/08/2018   '  2. Vitamin D deficiency Jenna [name]'s last Vit D was low at 24.8. She is on prescription Vit D.  3. Other depression, with emotional eating  Jenna [name] does not notice a difference in her hunger with bupropion. She feels it may be causing cravings and making her hungrier.  Assessment/Plan:   1. Insulin resistance Jenna [name] will continue her meal plan, and will continue to work on weight loss, exercise, and decreasing simple carbohydrates to help decrease  the risk of diabetes. We will check labs today. Jenna [name] agreed to follow-up with [place] as directed to closely monitor her progress.  - Comprehensive metabolic panel - Hemoglobin A1c - Insulin, random  2. Vitamin D deficiency Low Vitamin D level contributes to fatigue and are associated with obesity, breast, and colon cancer. Jenna [name] agreed to continue taking prescription Vitamin D 50,000 IU every week and will follow-up for routine testing of Vitamin D, at least 2-3 times per year to avoid over-replacement. We will check labs today.  - VITAMIN D 25 Hydroxy (Vit-D Deficiency, Fractures)  3. Other depression, with emotional eating  Behavior modification techniques were discussed today to help Jenna [name] deal with her emotional/non-hunger eating behaviors. Jenna [name] agreed to discontinue bupropion and will follow up as directed. Orders and follow up as documented in patient record.   4. Class 3 severe obesity with serious comorbidity and body mass index (BMI) of 40.0 to 44.9 in adult, unspecified obesity type (HCC) Jenna [name] is currently in the action stage of change. As such, her goal is to continue with weight loss efforts. She has agreed to keeping a food journal and adhering to recommended goals of 1000-1100 calories and 75 grams of protein daily.   Jenna [name] will cut out potatoes.  Handouts given today: Journaling and Protein Content of Foods.  Exercise goals: As is.  Behavioral modification strategies: decreasing simple carbohydrates and keeping a strict food journal.  Jenna [name] has agreed to follow-up with our clinic in 2 to 3 weeks.  She was informed of the importance of frequent follow-up visits to maximize her success with intensive lifestyle modifications for her multiple health conditions.   Jenna [name] was informed we would discuss her lab results at her next visit unless there is a critical issue that needs to be addressed sooner. Jenna [name] agreed to keep her next visit at the agreed upon  time to discuss these results.  Objective:   Blood pressure 111/74, pulse 80, temperature 98.5 F (36.9 C), temperature source Oral, height 5\' 5"  (1.651 m), weight 270 lb (122.5 kg), SpO2 100 %. Body mass index is 44.93 kg/m.  General: Cooperative, alert, well developed, in no acute distress. HEENT: Conjunctivae and lids unremarkable. Cardiovascular: Regular rhythm.  Lungs: Normal work of breathing. Neurologic: No focal deficits.   Lab Results  Component Value Date   CREATININE 0.85 04/02/2019   BUN 8 04/02/2019   NA 142 04/02/2019   K 4.7 04/02/2019   CL 107 (H) 04/02/2019   CO2 22 04/02/2019   Lab Results  Component Value Date   ALT 13 04/02/2019   AST 16 04/02/2019   ALKPHOS 73 04/02/2019   BILITOT 0.4 04/02/2019   Lab Results  Component Value Date   HGBA1C 5.4 12/08/2018   HGBA1C 5.6 12/03/2017   Lab Results  Component Value Date   INSULIN 7.4 12/08/2018   Lab Results  Component Value Date   TSH 1.040 12/08/2018   Lab Results  Component Value Date   CHOL 143 12/08/2018   HDL 47 12/08/2018   LDLCALC 81 12/08/2018   TRIG 79 12/08/2018   Lab Results  Component Value Date   WBC 5.6 04/02/2019   HGB 12.5 04/02/2019   HCT 37.8 04/02/2019   MCV 89 04/02/2019   PLT 253 04/02/2019   Lab Results  Component Value Date   IRON 53 04/02/2019   TIBC 360 04/02/2019   FERRITIN 26 04/02/2019   Attestation Statements:   Reviewed by clinician on day of visit: allergies, medications, problem list, medical history, surgical history, family history, social history, and previous encounter notes.   Wilhemena Durie, am acting as Location manager for [employer], FNP-C.  I have reviewed the above documentation for accuracy and completeness, and I agree with the above. -  Georgianne Fick, FNP

## 2019-07-29 LAB — VITAMIN D 25 HYDROXY (VIT D DEFICIENCY, FRACTURES): Vit D, 25-Hydroxy: 24.7 ng/mL — ABNORMAL LOW (ref 30.0–100.0)

## 2019-07-29 LAB — COMPREHENSIVE METABOLIC PANEL
ALT: 21 IU/L (ref 0–32)
AST: 16 IU/L (ref 0–40)
Albumin/Globulin Ratio: 1.8 (ref 1.2–2.2)
Albumin: 4.3 g/dL (ref 3.8–4.8)
Alkaline Phosphatase: 75 IU/L (ref 48–121)
BUN/Creatinine Ratio: 10 (ref 9–23)
BUN: 11 mg/dL (ref 6–24)
Bilirubin Total: 0.3 mg/dL (ref 0.0–1.2)
CO2: 21 mmol/L (ref 20–29)
Calcium: 9.2 mg/dL (ref 8.7–10.2)
Chloride: 109 mmol/L — ABNORMAL HIGH (ref 96–106)
Creatinine, Ser: 1.12 mg/dL — ABNORMAL HIGH (ref 0.57–1.00)
GFR calc Af Amer: 68 mL/min/{1.73_m2} (ref >59)
GFR calc non Af Amer: 59 mL/min/{1.73_m2} — ABNORMAL LOW (ref >59)
Globulin, Total: 2.4 g/dL (ref 1.5–4.5)
Glucose: 87 mg/dL (ref 65–99)
Potassium: 4.9 mmol/L (ref 3.5–5.2)
Sodium: 140 mmol/L (ref 134–144)
Total Protein: 6.7 g/dL (ref 6.0–8.5)

## 2019-07-29 LAB — HEMOGLOBIN A1C
Est. average glucose Bld gHb Est-mCnc: 111 mg/dL
Hgb A1c MFr Bld: 5.5 % (ref 4.8–5.6)

## 2019-07-29 LAB — INSULIN, RANDOM: INSULIN: 16.1 u[IU]/mL (ref 2.6–24.9)

## 2019-08-07 ENCOUNTER — Ambulatory Visit: Payer: Self-pay | Admitting: Family Medicine

## 2019-08-13 ENCOUNTER — Ambulatory Visit (INDEPENDENT_AMBULATORY_CARE_PROVIDER_SITE_OTHER): Payer: Self-pay | Admitting: Bariatrics

## 2019-08-21 NOTE — Progress Notes (Signed)
Virtual Visit via Telephone Note  I connected with Jenna [name], on 08/24/2019 at 3:25 PM by telephone due to the COVID-19 pandemic and verified that I am speaking with the correct person using two identifiers.  Due to current restrictions/limitations of in-office visits due to the COVID-19 pandemic, this scheduled clinical appointment was converted to a telehealth visit.   Consent: I discussed the limitations, risks, security and privacy concerns of performing an evaluation and management service by telephone and the availability of in person appointments. I also discussed with the patient that there may be a patient responsible charge related to this service. The patient expressed understanding and agreed to proceed.  Location of Patient: Home  Location of Provider: [facility]  Persons participating in Telemedicine visit: Rosita Fire, NP Mariane Baumgarten, CMA  History of Present Illness:  CC: Chronic Issues Follow-Up  Subjective: Jenna [name] is a 49 y.o. female with history of insulin resistance, primary osteoarthritis of both knees, low back pain, class 3 severe obesity with serious comorbidity and body mass index 40.0 to 44.9 in adult, vitamin D deficiency, and depression who presents for chronic issues follow-up.  1. GANGLION CYST OF DORSUM OF RIGHT WRIST FOLLOW-UP: Last visit 04/09/2019 with Dr. Chapman Fitch. During that encounter referred to Orthopedics for further evaluation and treatment.  Today patient reports she has seen Dr. Ninfa Linden with Orthopedics. Reports she received an injection of the right wrist around February/March 2021. Reports she plans to schedule another injection of the wrist soon.  2. ACUTE PAIN OF RIGHT SHOULDER FOLLOW-UP: Last visit 04/09/2019 with Dr. Chapman Fitch. During that encounter right shoulder pain thought to be possible associated with rotator cuff tendinopathy. Patient was advised that she may take over-the-counter  medication as needed and referred to Orthopedics for further evaluation and treatment.   Today reports she has seen Dr. Ninfa Linden with Orthopedics. Reports and MRI of the right shoulder was done and resulted arthritis. Reports she has had at least 2 injections in her right shoulder and that she is scheduled to return for another injection some time next week.   3. LOW BACK PAIN WITH RADIATION FOLLOW-UP:  Last visit 04/09/2019 with Dr. Chapman Fitch. During that encounter Duloxetine continued and referred to Orthopedics.  Today reports she is attending a weight loss classes named [facility]. Reports she is enjoying the class and has made some lifestyle changes in hopes of losing weight and being able to have her knee surgeries. Reports changes in her diet which includes increased protein, 1000 calorie daily diet, 7 gram protein limit, more fruit, [place],  chicken, limiting bread and increasing water. Reports she is somewhat disappointed because she is eating better but not losing as much weight as quickly as she would like. Reports exercising with a pedal bike. States she is still having bilateral knee pain and low back pain with muscle spasms. She hopes to be cleared for surgery soon  after she looses some weight. Reports Duloxetine is helping with back pain and requesting refill.  4. DEPRESSION FOLLOW-UP: Last visit 04/09/2019 with Dr. Chapman Fitch. During that encounter Duloxetine continued for management of depression.  Today reports Duloxetine is helping and requests refill. Denies thoughts of self-harm and suicidal ideation.  Past Medical History:  Diagnosis Date  . Arthritis   . Back pain   . Chest pain   . Depression   . Edema of both lower extremities   . High blood pressure   . Joint pain   .  Osteoarthritis    No Known Allergies  Current Outpatient Medications on File Prior to Visit  Medication Sig Dispense Refill  . DULoxetine (CYMBALTA) 60 MG capsule One tablet (60mg ) once  per day for back pain and mood 90 capsule 1  . ferrous sulfate 324 MG TBEC Take 324 mg by mouth.    . methocarbamol (ROBAXIN) 500 MG tablet Take 1 tablet (500 mg total) by mouth 3 (three) times daily. As needed for muscle spasm 90 tablet 3  . Vitamin D, Ergocalciferol, (DRISDOL) 1.25 MG (50000 UNIT) CAPS capsule Take 1 capsule (50,000 Units total) by mouth every 7 (seven) days. 4 capsule 0   No current facility-administered medications on file prior to visit.    Observations/Objective: Alert and oriented x 3. Not in acute distress. Physical examination not completed as this is a telemedicine visit.  Assessment and Plan: 1. Ganglion cyst of dorsum of right wrist: -Last visit 04/20/2019 with Dr. 06/18/2019. During that encounter right wrist cyst aspirated. Cyst was gelatinous and clear consistent with a ganglion cyst. Cyst decompressed fully.  -Today patient reports she plans to have a repeat aspiration of right wrist as the cyst has returned. Reports she is in stable condition. -Encouraged patient to keep follow-up appointments with Orthopedics.  2. Acute pain of right shoulder: -Last visit 04/20/2019 with Dr. 06/18/2019. During that encounter steroid injection in right shoulder subacromial space. -Last MRI of right shoulder on 06/11/2019 which resulted with tendinosis and osteoarthritis. At that time patient was not interested in surgery. -On 06/17/2019 patient had ultrasound-guided intra-articular right glenohumeral injection related to rotator cuff tendinopathy and intra-articular biceps tendinosis. -Today patient reports she has a repeat right shoulder injection scheduled for next week.  3. Low back pain with radiation: -Patient has low back pain and end-stage arthritis of left knee.  -Patient would like to have left knee surgery. Patient has been encouraged from both her primary physician and her orthopedic surgeon to lose some weight before surgery.   -Last visit 06/16/2019 with Dr. 08/16/2019. During  that encounter patient counseled due to her weight and BMI of 45.1 surgery can not be done. It was discussed with patient that her weight would need to be 237 pounds or lower to have a BMI that is acceptable for knee replacement. -Last visit 07/14/2019 with Dr. 09/13/2019. During that encounter the plan was for patient to continue to work on weight loss and to return in 6 weeks for height and weight check.  -Today patient reports she's attending weight management classes and enjoying them. Reports she has made changes in diet which includes increasing protein, vegetables, and fruit; increasing water intake; and limiting bread. Also reports she is exercising with a pedal bike. Reports she has follow-up appointment with Orthopedics scheduled for next week. -Will continue Cymbalta for management of low back pain with radiation.  -Encouraged patient to keep appointments with Orthopedics. - DULoxetine (CYMBALTA) 60 MG capsule; One tablet (60mg ) once per day for back pain and mood  Dispense: 90 capsule; Refill: 0  4. Mild episode of depression (HCC): -Today reports Duloxetine is helping with depression which is related to low back pain and left knee pain. -Denies ideas of self-harm and suicidal ideation. -Follow-up with primary physician as needed. - DULoxetine (CYMBALTA) 60 MG capsule; One tablet (60mg ) once per day for back pain and mood  Dispense: 90 capsule; Refill: 0  Follow Up Instructions: Patient was given clear instructions to go to Emergency Department or return to medical center if symptoms  don't improve, worsen, or new problems develop.The patient verbalized understanding.  I discussed the assessment and treatment plan with the patient. The patient was provided an opportunity to ask questions and all were answered. The patient agreed with the plan and demonstrated an understanding of the instructions.   The patient was advised to call back or seek an in-person evaluation if the symptoms worsen or  if the condition fails to improve as anticipated.  I provided 15 minutes total of non-face-to-face time during this encounter including median intraservice time, reviewing previous notes, labs, imaging, medications, management and patient verbalized understanding.    Rema Fendt, NP  [facility] [place], [place] [phone]   08/24/2019, 3:25 PM

## 2019-08-24 ENCOUNTER — Ambulatory Visit: Payer: Self-pay | Attending: Family | Admitting: Family

## 2019-08-24 DIAGNOSIS — F32A Depression, unspecified: Secondary | ICD-10-CM

## 2019-08-24 DIAGNOSIS — M67431 Ganglion, right wrist: Secondary | ICD-10-CM

## 2019-08-24 DIAGNOSIS — M545 Low back pain, unspecified: Secondary | ICD-10-CM

## 2019-08-24 DIAGNOSIS — F320 Major depressive disorder, single episode, mild: Secondary | ICD-10-CM

## 2019-08-24 DIAGNOSIS — M25511 Pain in right shoulder: Secondary | ICD-10-CM

## 2019-08-24 MED ORDER — DULOXETINE HCL 60 MG PO CPEP: ORAL_CAPSULE | ORAL | 0 refills | Status: DC

## 2019-08-24 MED FILL — DULoxetine HCL 60 MG CPEP: 60 | 30 days supply | Qty: 30 | Fill #0

## 2019-08-24 NOTE — Patient Instructions (Addendum)
Keep appointments with Orthopedics for low back back, left knee pain, right wrist cyst, and right shoulder pain. Continue Duloxetine for pain and depression management. Follow-up with primary physician as needed. Chronic Knee Pain, Adult Knee pain that lasts longer than 3 months is called chronic knee pain. You may have pain in one or both knees. Symptoms of chronic knee pain may also include swelling and stiffness. The most common cause is age-related wear and tear (osteoarthritis) of your knee joint. Many conditions can cause chronic knee pain. Treatment depends on the cause. The main treatments are physical therapy and weight loss. It may also be treated with medicines, injections, a knee sleeve or brace, and by using crutches. Rest, ice, compression (pressure), and elevation (RICE) therapy may also be recommended. Follow these instructions at home: If you have a knee sleeve or brace:   Wear it as told by your doctor. Remove it only as told by your doctor.  Loosen it if your toes: ? Tingle. ? Become numb. ? Turn cold and blue.  Keep it clean.  If the sleeve or brace is not waterproof: ? Do not let it get wet. ? Remove it if told by your doctor, or cover it with a watertight covering when you take a bath or shower. Managing pain, stiffness, and swelling      If told, put heat on your knee. Do this as often as told by your doctor. Use the heat source that your doctor recommends, such as a moist heat pack or a heating pad. ? If you have a removable sleeve or brace, remove it as told by your doctor. ? Place a towel between your skin and the heat source. ? Leave the heat on for 20-30 minutes. ? Remove the heat if your skin turns bright red. This is very important if you are unable to feel pain, heat, or cold. You may have a greater risk of getting burned.  If told, put ice on your knee. ? If you have a removable sleeve or brace, remove it as told by your doctor. ? Put ice in a plastic  bag. ? Place a towel between your skin and the bag. ? Leave the ice on for 20 minutes, 2-3 times a day.  Move your toes often.  Raise (elevate) the injured area above the level of your heart while you are sitting or lying down. Activity  Avoid activities where both feet leave the ground at the same time (high-impact activities). Examples are running, jumping rope, and doing jumping jacks.  Return to your normal activities as told by your doctor. Ask your doctor what activities are safe for you.  Follow the exercise plan that your doctor makes for you. Your doctor may suggest that you: ? Avoid activities that make knee pain worse. You may need to change the exercises that you do, the sports that you participate in, or your job duties. ? Wear shoes with cushioned soles. ? Avoid high-impact activities or sports that require running and sudden changes in direction. ? Do exercises or physical therapy as told by your doctor. Physical therapy is planned to match your needs and abilities. ? Do exercises that increase your balance and strength, such as tai chi and yoga.  Do not use your injured knee to support your body weight until your doctor says that you can. Use crutches, a cane, or a walker, as told by your doctor. General instructions  Take over-the-counter and prescription medicines only as told by your  doctor.  If you are overweight, work with your doctor and a food expert (dietitian) to set goals to lose weight. Being overweight can make your knee hurt more.  Do not use any products that contain nicotine or tobacco, such as cigarettes, e-cigarettes, and chewing tobacco. If you need help quitting, ask your doctor.  Keep all follow-up visits as told by your doctor. This is important. Contact a doctor if:  You have knee pain that is not getting better or gets worse.  You are not able to do your exercises due to knee pain. Get help right away if:  Your knee swells and the swelling  becomes worse.  You cannot move your knee.  You have very bad knee pain. Summary  Knee pain that lasts more than 3 months is considered chronic knee pain.  The main treatments for chronic knee pain are physical therapy and weight loss. You may also need to take medicines, wear a knee sleeve or brace, use crutches, and put ice or heat on your knee.  Lose weight if you are overweight. Work with your doctor and a food expert (dietitian) to help you set goals to lose weight. Being overweight can make your knee hurt more.  Work with a physical therapist to make a safe exercise program, as told by your doctor. This information is not intended to replace advice given to you by your health care provider. Make sure you discuss any questions you have with your health care provider. Document Revised: 05/08/2018 Document Reviewed: 05/08/2018 Elsevier Patient Education  [place].

## 2019-08-31 ENCOUNTER — Ambulatory Visit (INDEPENDENT_AMBULATORY_CARE_PROVIDER_SITE_OTHER): Payer: Self-pay | Admitting: Physician Assistant

## 2019-08-31 ENCOUNTER — Other Ambulatory Visit: Payer: Self-pay

## 2019-08-31 ENCOUNTER — Encounter: Payer: Self-pay | Admitting: Orthopaedic Surgery

## 2019-08-31 VITALS — Ht 65.0 in | Wt 277.0 lb

## 2019-08-31 DIAGNOSIS — M1712 Unilateral primary osteoarthritis, left knee: Secondary | ICD-10-CM

## 2019-08-31 DIAGNOSIS — M7541 Impingement syndrome of right shoulder: Secondary | ICD-10-CM

## 2019-08-31 MED ORDER — METHYLPREDNISOLONE ACETATE 40 MG/ML IJ SUSP
40.0000 mg | INTRAMUSCULAR | Status: AC | PRN
  Administered 2019-08-31: 40 mg via INTRA_ARTICULAR

## 2019-08-31 MED ORDER — LIDOCAINE HCL 1 % IJ SOLN
3.0000 mL | INTRAMUSCULAR | Status: AC | PRN
  Administered 2019-08-31: 3 mL

## 2019-08-31 NOTE — Progress Notes (Signed)
Office Visit Note   Patient: Jenna [name]           Date of Birth: Aug 20, 1970           MRN: [id number] Visit Date: 08/31/2019              Requested by: Antony Blackbird, MD [place],   [id number] PCP: Antony Blackbird, MD   Assessment & Plan: Visit Diagnoses:  1. Unilateral primary osteoarthritis, left knee   2. Impingement syndrome of right shoulder     Plan: Discussed with her trying a second subacromial injection right shoulder she is agreeable with this.  We will also send her to formal physical therapy for range of motion strengthening home exercise program and modalities to the right shoulder.  See her back in just 1 month to see how she is doing in regards to the shoulder.  In regards to her weight she will continue to work with weight loss clinic.  She understands that her BMI needs to be below 40.     Follow-Up Instructions: Return in about 4 weeks (around 09/28/2019).   Orders:  Orders Placed This Encounter  Procedures   Large Joint Inj   No orders of the defined types were placed in this encounter.     Procedures: Large Joint Inj: R subacromial bursa on 08/31/2019 11:50 AM Indications: pain Details: 22 G 1.5 in needle, superior approach  Arthrogram: No  Medications: 3 mL lidocaine 1 %; 40 mg methylPREDNISolone acetate 40 MG/ML Outcome: tolerated well, no immediate complications Procedure, treatment alternatives, risks and benefits explained, specific risks discussed. Consent was given by the patient. Immediately prior to procedure a time out was called to verify the correct patient, procedure, equipment, support staff and site/side marked as required. Patient was prepped and draped in the usual sterile fashion.       Clinical Data: No additional findings.   Subjective: Chief Complaint  Patient presents with   Left Knee - Pain    HPI [name] returns today for primarily weight check.  She continues to try to lose weight and is  going to weight loss clinic.  She has end-stage arthritis of her left knee is wanting to undergo knee arthroplasty. Also she is being seen for her right shoulder.  She states the intra-articular injection that Dr. Junius Roads gave her gave her good relief for about a month but her pain slowly returning she is wondering what else can be done.  She has had no new injury to the right shoulder.  Again she has rotator cuff tendinopathy.    Review of Systems   Objective: Vital Signs: Ht 5\' 5"  (1.651 m)    Wt 277 lb (125.6 kg)    BMI 46.10 kg/m   Physical Exam Constitutional:      Appearance: She is not ill-appearing or diaphoretic.  Pulmonary:     Effort: Pulmonary effort is normal.  Neurological:     Mental Status: She is alert and oriented to person, place, and time.  Psychiatric:        Mood and Affect: Mood normal.     Ortho Exam Right shoulder slight weakness with external rotation against resistance.  Otherwise 5 out of 5 strength bilateral shoulders with internal and external rotation.  Positive impingement testing on the right negative on the left.    Specialty Comments:  No specialty comments available.  Imaging: No results found.   PMFS History: Patient Active Problem List   Diagnosis  Date Noted   Insulin resistance 07/28/2019   Depression 07/28/2019   Vitamin D deficiency 12/09/2018   Class 3 severe obesity with serious comorbidity and body mass index (BMI) of 40.0 to 44.9 in adult (HCC) 01/17/2017   Low back pain 05/17/2016   Unilateral primary osteoarthritis, left knee 05/08/2016   Primary osteoarthritis of both knees 06/27/2015   Closed right fibular fracture 08/10/2014   Past Medical History:  Diagnosis Date   Arthritis    Back pain    Chest pain    Depression    Edema of both lower extremities    High blood pressure    Joint pain    Osteoarthritis     Family History  Problem Relation Age of Onset   Diabetes Mother    Hypertension Mother      Past Surgical History:  Procedure Laterality Date   CHOLECYSTECTOMY     TUBAL LIGATION     Social History   Occupational History   Occupation: Stay at home  Tobacco Use   Smoking status: Current Every Day Smoker    Packs/day: 0.10    Types: Cigarettes   Smokeless tobacco: Never Used  Vaping Use   Vaping Use: Never used  Substance and Sexual Activity   Alcohol use: No    Alcohol/week: 0.0 standard drinks   Drug use: No   Sexual activity: Not on file

## 2019-08-31 NOTE — Addendum Note (Signed)
Addended by: Mardene Celeste B on: 08/31/2019 01:20 PM   Modules accepted: Orders

## 2019-09-16 ENCOUNTER — Ambulatory Visit: Payer: Self-pay | Attending: Physician Assistant | Admitting: Physical Therapy

## 2019-09-16 ENCOUNTER — Other Ambulatory Visit: Payer: Self-pay

## 2019-09-16 ENCOUNTER — Encounter: Payer: Self-pay | Admitting: Physical Therapy

## 2019-09-16 DIAGNOSIS — G8929 Other chronic pain: Secondary | ICD-10-CM | POA: Insufficient documentation

## 2019-09-16 DIAGNOSIS — M25511 Pain in right shoulder: Secondary | ICD-10-CM | POA: Insufficient documentation

## 2019-09-16 DIAGNOSIS — M25611 Stiffness of right shoulder, not elsewhere classified: Secondary | ICD-10-CM | POA: Insufficient documentation

## 2019-09-16 DIAGNOSIS — R293 Abnormal posture: Secondary | ICD-10-CM | POA: Insufficient documentation

## 2019-09-16 DIAGNOSIS — M6281 Muscle weakness (generalized): Secondary | ICD-10-CM | POA: Insufficient documentation

## 2019-09-16 NOTE — Therapy (Signed)
[facility] Outpatient Rehabilitation [facility] [address] [place], [place], [address] Phone: [phone]   Fax:  [phone]  Physical Therapy Evaluation  Patient Details  Name: Jenna [name] MRN: [id number] Date of Birth: Dec 22, 1970 Referring Provider (PT): Kirtland Bouchard, [place]   Encounter Date: 09/16/2019   PT End of Session - 09/16/19 1145    Visit Number 1    Number of Visits 12    Date for PT Re-Evaluation 10/28/19    PT Start Time 1138    PT Stop Time 1225    PT Time Calculation (min) 47 min    Activity Tolerance Patient tolerated treatment well    Behavior During Therapy [facility] for tasks assessed/performed           Past Medical History:  Diagnosis Date   Arthritis    Back pain    Chest pain    Depression    Edema of both lower extremities    High blood pressure    Joint pain    Osteoarthritis     Past Surgical History:  Procedure Laterality Date   CHOLECYSTECTOMY     TUBAL LIGATION      There were no vitals filed for this visit.    Subjective Assessment - 09/16/19 1139    Subjective Pt reports R shoulder pain for a few months. Pt reports 3 injections and an MRI done. Pt states that second injection helped for a little bit but then it started up again. Pt is right handed; first felt like a pull and progressed to where some nights she would wake up and lifting her arm would hurt. Pt reports hardly being able to lift a gallon of milk. Pt reports top of shoulder hurts the most and pain would go up her neck and down to her elbow/wrist.    Patient is accompained by: Family member    Limitations House hold activities;Lifting    Diagnostic tests MRI: Moderate arthritis in shoulder    Patient Stated Goals Decrease pain    Currently in Pain? Yes    Pain Score 4     Pain Location Shoulder    Pain Orientation Right    Pain Descriptors / Indicators Aching;Sore;Tightness;Tender    Pain Radiating Towards down to elbow    Pain Onset More  than a month ago    Pain Frequency Constant    Aggravating Factors  Any arm movement    Pain Relieving Factors Muscle relaxer, heat              [facility] PT Assessment - 09/16/19 0001      Assessment   Medical Diagnosis M75.41 (ICD-10-CM) - Impingement syndrome of right shoulder    Referring Provider (PT) Kirtland Bouchard, PA-C    Hand Dominance Right    Prior Therapy None      Precautions   Precautions None      Restrictions   Weight Bearing Restrictions No      Balance Screen   Has the patient fallen in the past 6 months No      Home Environment   Living Environment Private residence    Living Arrangements Spouse/significant other      Prior Function   Level of Independence Independent    Vocation Unemployed    Leisure Read      Observation/Other Assessments   Focus on Therapeutic Outcomes (FOTO)  60%      Posture/Postural Control   Postural Limitations Rounded Shoulders;Increased thoracic kyphosis  Posture Comments anterior and superiorly placed humerus      AROM   Overall AROM Comments All motion is WFL; however, has pain with motion.     Right Shoulder Flexion 92 Degrees   to pain   Right Shoulder ABduction 110 Degrees   to pain   Right Shoulder External Rotation 45 Degrees   to pain     Strength   Right Shoulder Flexion 4-/5    Right Shoulder ABduction 4-/5    Right Shoulder Internal Rotation 3+/5    Right Shoulder External Rotation 3+/5    Right Elbow Flexion 4-/5    Right Elbow Extension 4-/5      Flexibility   Soft Tissue Assessment /Muscle Length yes   R shoulder 11.5 cm, L shoulder 9 cmelevated from mat table      Palpation   Palpation comment Tender bilat upper trap, levator, and cervicothoracic paraspinals      Special Tests   Rotator Cuff Impingment tests Neer impingement test;Empty Can test      Neer Impingement test    Findings Positive    Side Right      Empty Can test   Findings Positive    Side Right                       Objective measurements completed on examination: See above findings.               PT Education - 09/16/19 1259    Education Details Discussed physiology, anatomy and POC    Person(s) Educated Patient    Methods Explanation;Demonstration;Handout;Verbal cues    Comprehension Verbalized understanding;Returned demonstration;Tactile cues required            PT Short Term Goals - 09/16/19 1250      PT SHORT TERM GOAL #1   Title Pt will have decreased rounded shoulders by ~1cm in supine    Baseline 11.5 cm on R, 9 cm on L (measuring acromion to table)    Time 3    Period Weeks    Status New    Target Date 10/07/19             PT Long Term Goals - 09/16/19 1252      PT LONG TERM GOAL #1   Title Pt will be independent with her HEP    Baseline Limited    Time 6    Period Weeks    Status New    Target Date 10/28/19      PT LONG TERM GOAL #2   Title Pt will have complete pain free ROM in all directions for overhead tasks    Baseline Flexion ~90 deg, abduction ~100-110 deg until pain    Time 6    Period Weeks    Status New    Target Date 10/28/19      PT LONG TERM GOAL #3   Title Pt will be able to lift a gallon of milk without pain    Baseline Unable    Time 6    Period Weeks    Status New    Target Date 10/28/19      PT LONG TERM GOAL #4   Title Pt will have improved FOTO score to 39%    Baseline 60% limitation    Time 6    Period Weeks    Status New    Target Date 10/28/19  Plan - 09/16/19 1243    Clinical Impression Statement Pt is a 49 y/o F presenting to clinic with R shoulder pain. Signs and symptoms present closely to impingement syndrome. Pt presents with rounded shoulders, anterior and superiorly rotated humerus to scapula, decreased R scapulohumeral rhythm, with decreased strength and ROM due to pain. Pt with highly tender to palpation trapezius and forearm musculature with multiple trigger  points. Pt would highly benefit from therapy to address these issues and optimize her level of function.    Personal Factors and Comorbidities Comorbidity 1;Time since onset of injury/illness/exacerbation;Comorbidity 2    Comorbidities arthritis, osteoporosis, depression    Examination-Activity Limitations Bathing;Dressing;Reach Overhead;Transfers    Examination-Participation Restrictions Cleaning;Community Activity;Driving;Laundry    Clinical Decision Making Moderate    Rehab Potential Good    PT Frequency 2x / week    PT Duration 6 weeks    PT Treatment/Interventions ADLs/Self Care Home Management;Cryotherapy;Electrical Stimulation;Iontophoresis 4mg /ml Dexamethasone;Moist Heat;Ultrasound;Functional mobility training;Therapeutic activities;Therapeutic exercise;Neuromuscular re-education;Patient/family education;Manual techniques;Passive range of motion;Dry needling;Taping;Vasopneumatic Device    PT Next Visit Plan Assess response to HEP, assess cervical & thoracic spine. Provide manual therapy as indicated for tender upper trap, cervicothoracic spine, and shoulders. Stretch pecs, upper trap and levators, check lat tightness. Strengthen scapular muscles. Consider taping, may benefit from DN.    PT Home Exercise Plan [email or web address]    Consulted and Agree with Plan of Care Patient           Patient will benefit from skilled therapeutic intervention in order to improve the following deficits and impairments:  Decreased coordination, Decreased range of motion, Increased fascial restricitons, Increased muscle spasms, Impaired UE functional use, Decreased activity tolerance, Pain, Hypomobility, Impaired flexibility, Decreased mobility, Decreased strength, Postural dysfunction  Visit Diagnosis: Chronic right shoulder pain  Stiffness of right shoulder, not elsewhere classified     Problem List Patient Active Problem List   Diagnosis Date Noted   Insulin resistance 07/28/2019   Depression  07/28/2019   Vitamin D deficiency 12/09/2018   Class 3 severe obesity with serious comorbidity and body mass index (BMI) of 40.0 to 44.9 in adult (HCC) 01/17/2017   Low back pain 05/17/2016   Unilateral primary osteoarthritis, left knee 05/08/2016   Primary osteoarthritis of both knees 06/27/2015   Closed right fibular fracture 08/10/2014    [facility] April Ma L Kionna Brier PT, DPT 09/16/2019, 1:00 PM  [facility] [address] [place], [place], [place] Phone: [phone]   Fax:  [phone]  Name: Dajane Valli MRN: [name] Date of Birth: 14-Sep-1970

## 2019-09-16 NOTE — Patient Instructions (Signed)
Access Code: [email or web address] URL: [email or web address] Date: 09/16/2019 Prepared by: Vernon Prey April Kirstie Peri  Exercises Seated Scapular Retraction - 1 x daily - 7 x weekly - 3 sets - 10 reps Shoulder External Rotation and Scapular Retraction with Resistance - 1 x daily - 7 x weekly - 3 sets - 10 reps Doorway Pec Stretch at 60 Elevation - 1 x daily - 7 x weekly - 3 sets - 10 reps Doorway Pec Stretch at 90 Degrees Abduction - 1 x daily - 7 x weekly - 3 sets - 10 reps Seated Gentle Upper Trapezius Stretch - 1 x daily - 7 x weekly - 3 sets - 10 reps Gentle Levator Scapulae Stretch - 1 x daily - 7 x weekly - 3 sets - 10 reps Sternocleidomastoid Stretch - 1 x daily - 7 x weekly - 3 sets - 10 reps

## 2019-09-23 ENCOUNTER — Ambulatory Visit: Payer: Self-pay | Admitting: Physical Therapy

## 2019-09-25 ENCOUNTER — Emergency Department (HOSPITAL_COMMUNITY)
Admission: EM | Admit: 2019-09-25 | Discharge: 2019-09-26 | Disposition: A | Discharge status: Home / Self Care | Payer: Self-pay | Attending: Emergency Medicine | Admitting: Emergency Medicine

## 2019-09-25 ENCOUNTER — Ambulatory Visit (HOSPITAL_COMMUNITY)
Admission: EM | Admit: 2019-09-25 | Discharge: 2019-09-25 | Disposition: A | Discharge status: Home / Self Care | Payer: Self-pay | Attending: Emergency Medicine | Admitting: Emergency Medicine

## 2019-09-25 ENCOUNTER — Encounter (HOSPITAL_COMMUNITY): Payer: Self-pay | Admitting: Emergency Medicine

## 2019-09-25 ENCOUNTER — Emergency Department (HOSPITAL_COMMUNITY): Payer: Self-pay

## 2019-09-25 ENCOUNTER — Ambulatory Visit: Payer: Self-pay

## 2019-09-25 ENCOUNTER — Encounter (HOSPITAL_COMMUNITY): Payer: Self-pay

## 2019-09-25 ENCOUNTER — Other Ambulatory Visit: Payer: Self-pay

## 2019-09-25 DIAGNOSIS — M79602 Pain in left arm: Secondary | ICD-10-CM | POA: Insufficient documentation

## 2019-09-25 DIAGNOSIS — F17210 Nicotine dependence, cigarettes, uncomplicated: Secondary | ICD-10-CM | POA: Insufficient documentation

## 2019-09-25 DIAGNOSIS — R079 Chest pain, unspecified: Secondary | ICD-10-CM

## 2019-09-25 DIAGNOSIS — M62838 Other muscle spasm: Secondary | ICD-10-CM | POA: Insufficient documentation

## 2019-09-25 LAB — CBC
HCT: 37.3 % (ref 36.0–46.0)
Hemoglobin: 11.9 g/dL — ABNORMAL LOW (ref 12.0–15.0)
MCH: 29.3 pg (ref 26.0–34.0)
MCHC: 31.9 g/dL (ref 30.0–36.0)
MCV: 91.9 fL (ref 80.0–100.0)
Platelets: 269 10*3/uL (ref 150–400)
RBC: 4.06 MIL/uL (ref 3.87–5.11)
RDW: 14.7 % (ref 11.5–15.5)
WBC: 8.9 10*3/uL (ref 4.0–10.5)
nRBC: 0 % (ref 0.0–0.2)

## 2019-09-25 LAB — BASIC METABOLIC PANEL
Anion gap: 6 (ref 5–15)
BUN: 8 mg/dL (ref 6–20)
CO2: 26 mmol/L (ref 22–32)
Calcium: 8.9 mg/dL (ref 8.9–10.3)
Chloride: 108 mmol/L (ref 98–111)
Creatinine, Ser: 0.89 mg/dL (ref 0.44–1.00)
GFR calc Af Amer: >60 mL/min (ref >60)
GFR calc non Af Amer: >60 mL/min (ref >60)
Glucose, Bld: 105 mg/dL — ABNORMAL HIGH (ref 70–99)
Potassium: 3.6 mmol/L (ref 3.5–5.1)
Sodium: 140 mmol/L (ref 135–145)

## 2019-09-25 LAB — D-DIMER, QUANTITATIVE: D-Dimer, Quant: 0.43 ug/mL-FEU (ref 0.00–0.50)

## 2019-09-25 LAB — TROPONIN I (HIGH SENSITIVITY): Troponin I (High Sensitivity): 16 ng/L (ref <18)

## 2019-09-25 IMAGING — DX DG CHEST 2V
2 series · 2 of 2 positions shown · non-contrast
Comparison: [DATE].

CLINICAL DATA: Shortness of breath.

EXAM:
CHEST - 2 VIEW

[w chest pa]
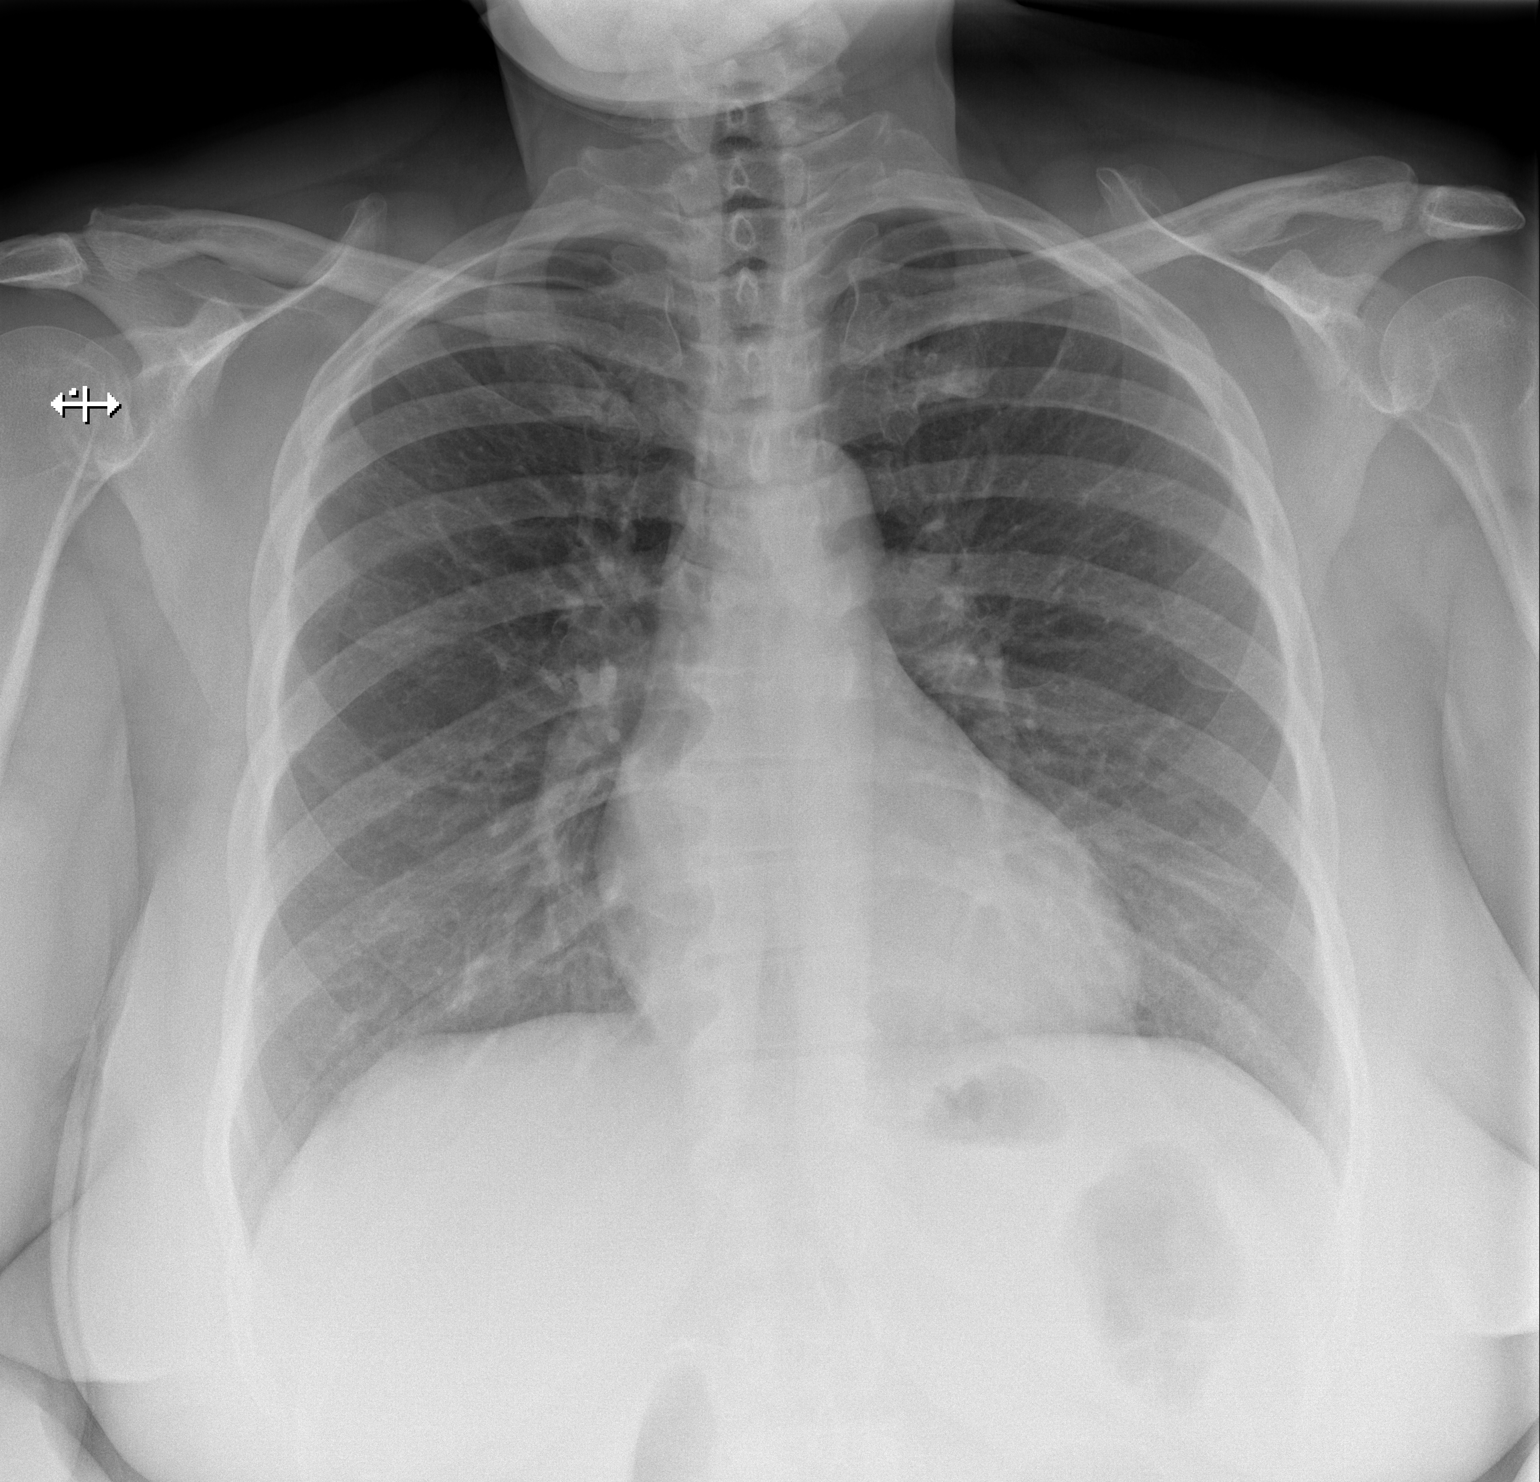

[w chest lat]
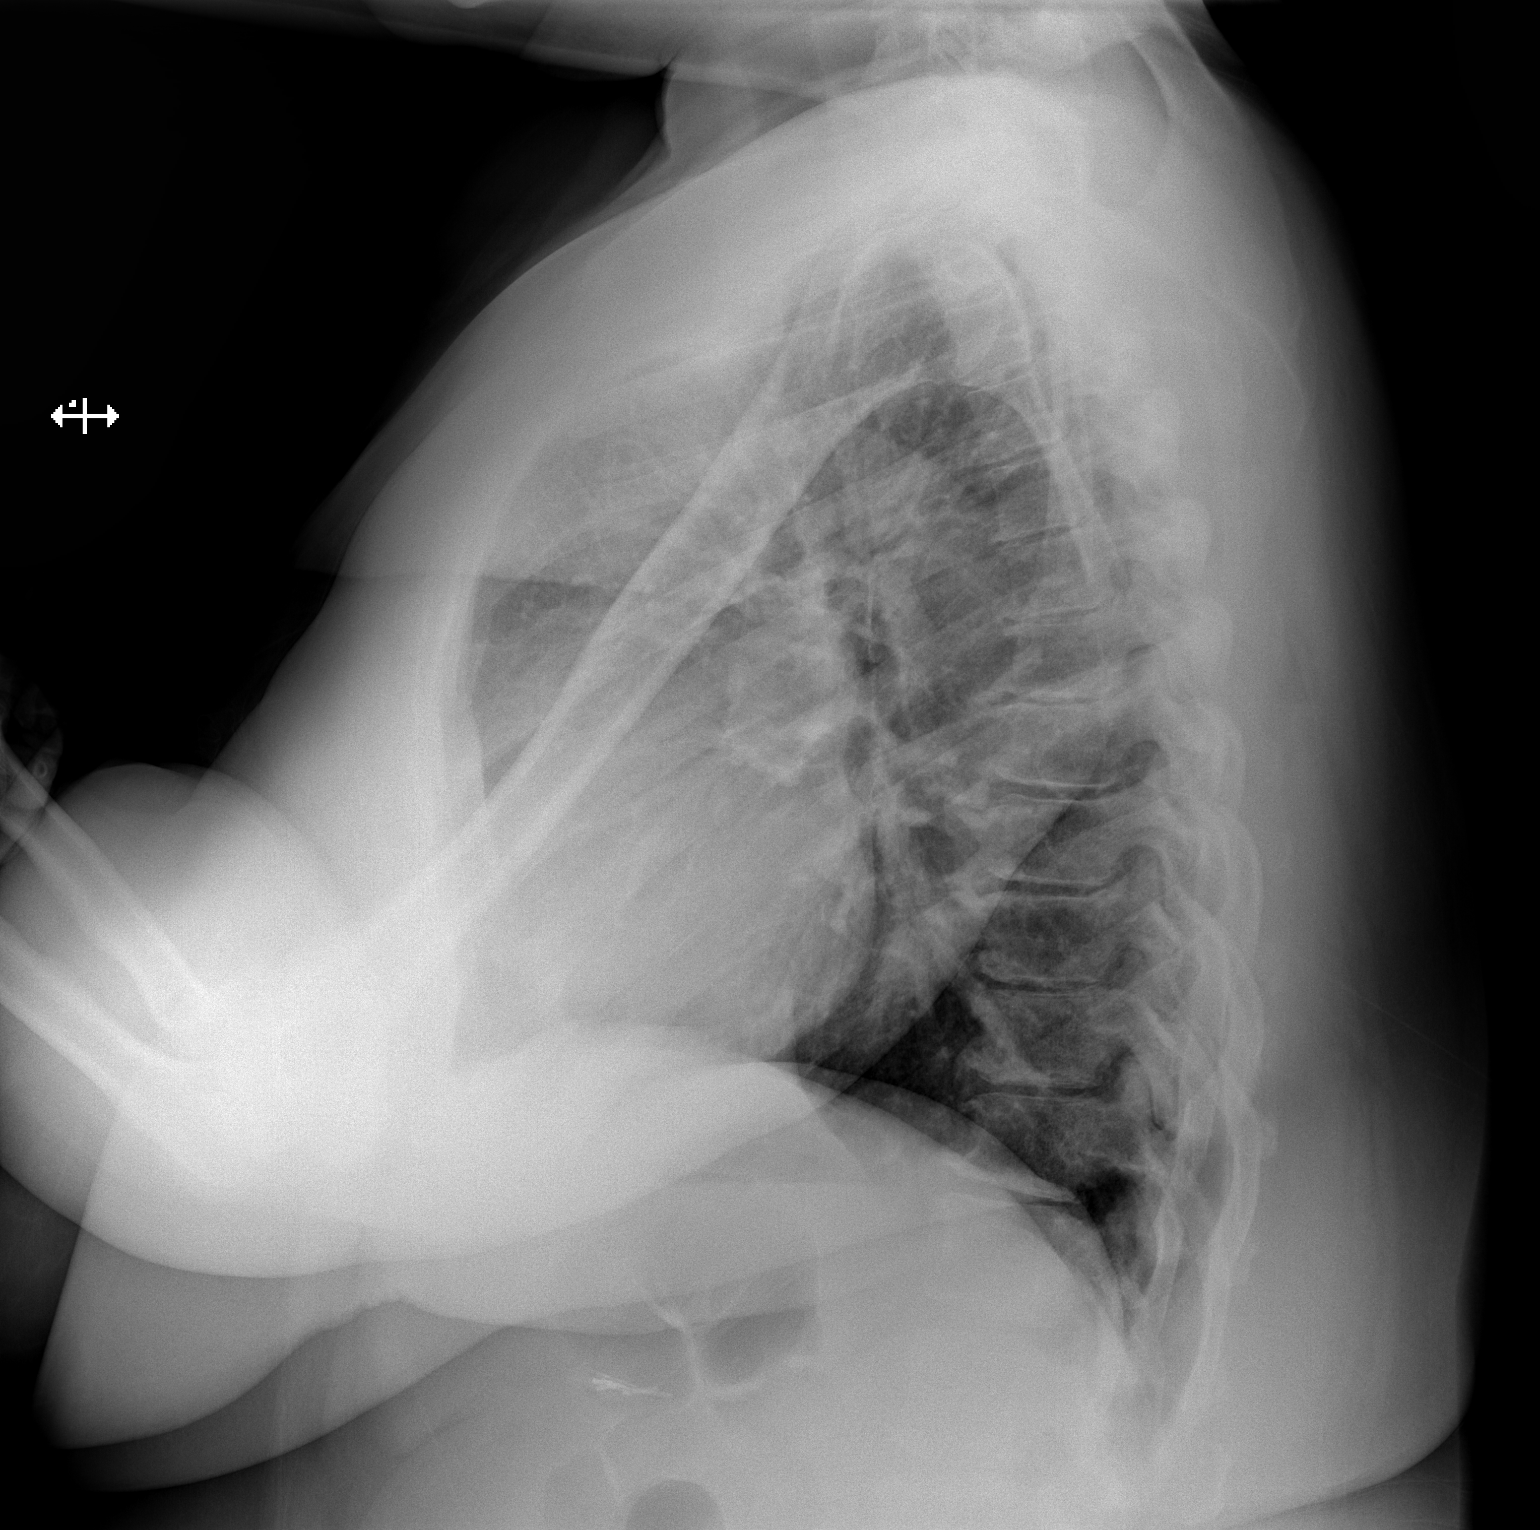

[2 of 2 positions shown; findings below may reference images not displayed]

FINDINGS: The heart size and mediastinal contours are within normal limits.
Both lungs are clear. No pneumothorax or pleural effusion is noted.
The visualized skeletal structures are unremarkable.
IMPRESSION: No active cardiopulmonary disease.

## 2019-09-25 MED ORDER — KETOROLAC TROMETHAMINE 30 MG/ML IJ SOLN
30.0000 mg | Freq: Once | INTRAMUSCULAR | Status: AC
  Administered 2019-09-25: 30 mg via INTRAMUSCULAR
  Filled 2019-09-25: qty 1

## 2019-09-25 MED ORDER — CYCLOBENZAPRINE HCL 10 MG PO TABS
10.0000 mg | ORAL_TABLET | Freq: Once | ORAL | Status: AC
  Administered 2019-09-26: 10 mg via ORAL
  Filled 2019-09-25: qty 1

## 2019-09-25 NOTE — ED Provider Notes (Signed)
[facility] EMERGENCY DEPARTMENT Provider Note   CSN: [id number] Arrival date & time: 09/25/19  1528     History Chief Complaint  Patient presents with  . Shortness of Breath    Jenna [name] is a 49 y.o. female.  Patient presents to the emergency department with a chief complaint of left upper neck and left upper back pain.  She states that the pain radiates from her neck down to her wrist and her left arm.  She states that the pain takes her breath away.  It is worsened with palpation of the muscles in the left upper back and neck.  She denies any injury.  She denies having any chest pain.  She states that she was seen at urgent care earlier today and was sent to the emergency department because she had an abnormal EKG.  She has tried using a topical creams on her muscles without relief.  She denies any history of heart problems.  Denies any history of PE.  She denies any recent travel, surgery, or immobilization.  She is not on any OCPs.  The history is provided by the patient. No language interpreter was used.       Past Medical History:  Diagnosis Date  . Arthritis   . Back pain   . Chest pain   . Depression   . Edema of both lower extremities   . High blood pressure   . Joint pain   . Osteoarthritis     Patient Active Problem List   Diagnosis Date Noted  . Insulin resistance 07/28/2019  . Depression 07/28/2019  . Vitamin D deficiency 12/09/2018  . Class 3 severe obesity with serious comorbidity and body mass index (BMI) of 40.0 to 44.9 in adult (HCC) 01/17/2017  . Low back pain 05/17/2016  . Unilateral primary osteoarthritis, left knee 05/08/2016  . Primary osteoarthritis of both knees 06/27/2015  . Closed right fibular fracture 08/10/2014    Past Surgical History:  Procedure Laterality Date  . CHOLECYSTECTOMY    . TUBAL LIGATION       OB History   No obstetric history on file.     Family History  Problem Relation Age of Onset  .  Diabetes Mother   . Hypertension Mother     Social History   Tobacco Use  . Smoking status: Current Every Day Smoker    Packs/day: 0.10    Types: Cigarettes  . Smokeless tobacco: Never Used  Vaping Use  . Vaping Use: Never used  Substance Use Topics  . Alcohol use: No    Alcohol/week: 0.0 standard drinks  . Drug use: No    Home Medications Prior to Admission medications   Medication Sig Start Date End Date Taking? Authorizing Provider  DULoxetine (CYMBALTA) 60 MG capsule One tablet (60mg ) once per day for back pain and mood 08/24/19   08/26/19, NP  ferrous sulfate 324 MG TBEC Take 324 mg by mouth.    [provider]  methocarbamol (ROBAXIN) 500 MG tablet Take 1 tablet (500 mg total) by mouth 3 (three) times daily. As needed for muscle spasm 07/27/18   Fulp, Cammie, MD  Vitamin D, Ergocalciferol, (DRISDOL) 1.25 MG (50000 UNIT) CAPS capsule Take 1 capsule (50,000 Units total) by mouth every 7 (seven) days. 06/18/19   08/18/19, DO    Allergies    Patient has no known allergies.  Review of Systems   Review of Systems  All other systems reviewed and  are negative.   Physical Exam Updated Vital Signs BP (!) 151/79 (BP Location: Right Arm)   Pulse 64   Temp 97.6 F (36.4 C) (Oral)   Resp 17   SpO2 100%   Physical Exam Vitals and nursing note reviewed.  Constitutional:      General: She is not in acute distress.    Appearance: She is well-developed.  HENT:     Head: Normocephalic and atraumatic.  Eyes:     Conjunctiva/sclera: Conjunctivae normal.  Cardiovascular:     Rate and Rhythm: Normal rate and regular rhythm.     Heart sounds: No murmur heard.   Pulmonary:     Effort: Pulmonary effort is normal. No respiratory distress.     Breath sounds: Normal breath sounds.  Abdominal:     Palpations: Abdomen is soft.     Tenderness: There is no abdominal tenderness.  Musculoskeletal:        General: Normal range of motion.     Cervical back: Neck  supple.     Comments: Tenderness to palpation of left sided cervical paraspinal muscles, left upper trapezius Range of motion of bilateral extremities is 5/5, though she states that ranging her left arm causes worsening pain in her back and neck. Strength 5/5 bilateral upper extremities  Skin:    General: Skin is warm and dry.  Neurological:     Mental Status: She is alert and oriented to person, place, and time.     Comments: Sensation intact  Psychiatric:        Mood and Affect: Mood normal.        Behavior: Behavior normal.     ED Results / Procedures / Treatments   Labs (all labs ordered are listed, but only abnormal results are displayed) Labs Reviewed  BASIC METABOLIC PANEL - Abnormal; Notable for the following components:      Result Value   Glucose, Bld 105 (*)    All other components within normal limits  CBC - Abnormal; Notable for the following components:   Hemoglobin 11.9 (*)    All other components within normal limits  D-DIMER, QUANTITATIVE (NOT AT [facility])  TROPONIN I (HIGH SENSITIVITY)  TROPONIN I (HIGH SENSITIVITY)    EKG None  Radiology DG Chest 2 View  Result Date: 09/25/2019 CLINICAL DATA:  Shortness of breath. EXAM: CHEST - 2 VIEW COMPARISON:  May 15, 2015. FINDINGS: The heart size and mediastinal contours are within normal limits. Both lungs are clear. No pneumothorax or pleural effusion is noted. The visualized skeletal structures are unremarkable. IMPRESSION: No active cardiopulmonary disease. Electronically Signed   By: Lupita Raider M.D.   On: 09/25/2019 17:04    Procedures Procedures (including critical care time)  Medications Ordered in ED Medications  ketorolac (TORADOL) 30 MG/ML injection 30 mg (has no administration in time range)    ED Course  I have reviewed the triage vital signs and the nursing notes.  Pertinent labs & imaging results that were available during my care of the patient were reviewed by me and considered in my  medical decision making (see chart for details).    MDM Rules/Calculators/A&P                          This patient complains of muscle pain and tenderness to her left upper back and left cervical paraspinal muscles.  She also states that the pain takes her breath away and was seen in urgent care  earlier today and had an abnormal EKG. This involves an extensive number of treatment options, and is a complaint that carries with it a high risk of complications and morbidity.  The differential diagnosis includes muscle spasm, muscle strain, torticollis, ACS, PE.  Pertinent Labs I ordered, reviewed, and interpreted labs, which included CBC, BMP, D-dimer, and troponin.  CBC and BMP are unremarkable.  D-dimer is 0.43, troponin is 16, will need repeat troponin.  Repeat troponin is 17.  Imaging Interpretation I ordered imaging studies which included chest x-ray.  I independently visualized and interpreted the chest x-ray, which showed no acute process.   Medications I ordered medication Toradol for pain.  Sources Previous records obtained and reviewed and show patient was seen at urgent care earlier today and sent to the ED for abnormal EKG.  EKG from urgent care showed inverted t-waves inferiorly.  This is no longer present on repeat EKG.  Repeat EKG is without significant changes from prior performed tonight in the ED.  Consultants None  Critical Interventions  None  Reassessments After the interventions stated above, I reevaluated the patient and found stable.  She still has reproducible muscular pain in her back and neck, but the symptoms have subsided some since receiving Toradol and Flexeril in the ED.  I do not believe her symptoms to be ACS.  She is low risk for PE given normal D-dimer.  Believe her symptoms are musculoskeletal, and that she can be safely discharged home.  Final Clinical Impression(s) / ED Diagnoses Final diagnoses:  Pain of left upper extremity  Muscle spasm     Rx / DC Orders ED Discharge Orders    None       Roxy Horseman, PA-C 09/26/19 0319    Mesner, Barbara Cower, MD 09/26/19 [id number]

## 2019-09-25 NOTE — ED Provider Notes (Signed)
[facility]    CSN: [id number] Arrival date & time: 09/25/19  1351      History   Chief Complaint Chief Complaint  Patient presents with  . chills, head, arm, neck pain    HPI Jenna [name] is a 49 y.o. female.   Jenna [name] presents with complaints of left shoulder, neck, chest and arm pain which started yesterday. She was getting out of bed when it started. Worse with lying. Worst this morning around 0200. Chills, no diaphoresis. No shortness of breath . Chest discomfort with deep breathing. She does smoke. History of hypertension. Grandparents with cardiac disease. Pain is constant. She feels fatigued. Mild nausea. No vomiting. She took naproxen this morning which didn't help.    EKG reviewed with scattered stwave inversions noted compared to previous ekg, noted tachycardia on arrival, with EKG rate of 99.          Past Medical History:  Diagnosis Date  . Arthritis   . Back pain   . Chest pain   . Depression   . Edema of both lower extremities   . High blood pressure   . Joint pain   . Osteoarthritis     Patient Active Problem List   Diagnosis Date Noted  . Insulin resistance 07/28/2019  . Depression 07/28/2019  . Vitamin D deficiency 12/09/2018  . Class 3 severe obesity with serious comorbidity and body mass index (BMI) of 40.0 to 44.9 in adult (HCC) 01/17/2017  . Low back pain 05/17/2016  . Unilateral primary osteoarthritis, left knee 05/08/2016  . Primary osteoarthritis of both knees 06/27/2015  . Closed right fibular fracture 08/10/2014    Past Surgical History:  Procedure Laterality Date  . CHOLECYSTECTOMY    . TUBAL LIGATION      OB History   No obstetric history on file.      Home Medications    Prior to Admission medications   Medication Sig Start Date End Date Taking? Authorizing Provider  DULoxetine (CYMBALTA) 60 MG capsule One tablet (60mg ) once per day for back pain and mood 08/24/19   08/26/19, NP  ferrous  sulfate 324 MG TBEC Take 324 mg by mouth.    [provider]  methocarbamol (ROBAXIN) 500 MG tablet Take 1 tablet (500 mg total) by mouth 3 (three) times daily. As needed for muscle spasm 07/27/18   Fulp, Cammie, MD  Vitamin D, Ergocalciferol, (DRISDOL) 1.25 MG (50000 UNIT) CAPS capsule Take 1 capsule (50,000 Units total) by mouth every 7 (seven) days. 06/18/19   08/18/19, DO    Family History Family History  Problem Relation Age of Onset  . Diabetes Mother   . Hypertension Mother     Social History Social History   Tobacco Use  . Smoking status: Current Every Day Smoker    Packs/day: 0.10    Types: Cigarettes  . Smokeless tobacco: Never Used  Vaping Use  . Vaping Use: Never used  Substance Use Topics  . Alcohol use: No    Alcohol/week: 0.0 standard drinks  . Drug use: No     Allergies   Patient has no known allergies.   Review of Systems Review of Systems   Physical Exam Triage Vital Signs ED Triage Vitals  Enc Vitals Group     BP 09/25/19 1438 131/79     Pulse Rate 09/25/19 1438 (!) 109     Resp 09/25/19 1438 18     Temp 09/25/19 1438 98.2 F (36.8 C)  Temp Source 09/25/19 1438 Oral     SpO2 09/25/19 1438 100 %     Weight 09/25/19 1439 275 lb (124.7 kg)     Height 09/25/19 1439 5\' 5"  (1.651 m)     Head Circumference --      Peak Flow --      Pain Score 09/25/19 1438 10     Pain Loc --      Pain Edu? --      Excl. in GC? --    No data found.  Updated Vital Signs BP 131/79   Pulse (!) 109   Temp 98.2 F (36.8 C) (Oral)   Resp 18   Ht 5\' 5"  (1.651 m)   Wt 275 lb (124.7 kg)   SpO2 100%   BMI 45.76 kg/m   Visual Acuity Right Eye Distance:   Left Eye Distance:   Bilateral Distance:    Right Eye Near:   Left Eye Near:    Bilateral Near:     Physical Exam   UC Treatments / Results  Labs (all labs ordered are listed, but only abnormal results are displayed) Labs Reviewed - No data to display  EKG   Radiology No  results found.  Procedures Procedures (including critical care time)  Medications Ordered in UC Medications - No data to display  Initial Impression / Assessment and Plan / UC Course  I have reviewed the triage vital signs and the nursing notes.  Pertinent labs & imaging results that were available during my care of the patient were reviewed by me and considered in my medical decision making (see chart for details).     Patient appears uncomfortable, rubbing left shoulder; noted stwave changes from previous ekgs with arm/shoulder/neck discomfort in this overweight african [birth year] female. Patient's daughter is going to drive her directly to the ER now for further cardiac evaluation.  Final Clinical Impressions(s) / UC Diagnoses   Final diagnoses:  Chest pain, unspecified type  Diffuse pain in left upper extremity     Discharge Instructions     Go to the ER now for further evaluation of your symptoms due to ekg changes    ED Prescriptions    None     PDMP not reviewed this encounter.   , NP 09/25/19 1514

## 2019-09-25 NOTE — Discharge Instructions (Signed)
Go to the ER now for further evaluation of your symptoms due to ekg changes

## 2019-09-25 NOTE — ED Triage Notes (Signed)
PT c/o chills, neck, head and left arm pain, fatigue since yesterday. Pt denies injury to any of those areas. Pt walked to exam room well. Pt able to move all extremities. Pt states has numbness and tingling in left hand.

## 2019-09-25 NOTE — ED Triage Notes (Signed)
Patient arrives to ED with complaints of left arm pain that started yesterday and shortness of breath that started today. Patient states that she's also had some chills and fatigue that has come and gone. Denies fevers & loss of smell/taste.

## 2019-09-26 LAB — TROPONIN I (HIGH SENSITIVITY): Troponin I (High Sensitivity): 17 ng/L (ref <18)

## 2019-09-26 MED ORDER — CYCLOBENZAPRINE HCL 10 MG PO TABS
10.0000 mg | ORAL_TABLET | Freq: Three times a day (TID) | ORAL | 0 refills | Status: DC | PRN
Start: 2019-09-26 — End: 2021-01-23

## 2019-09-26 NOTE — Discharge Instructions (Signed)
You had an abnormal EKG at urgent care and were sent to the ER.  Your ER workup is reassuring. Your symptoms are not thought to be caused by your heart.  Your EKGs in the ER did not show the same abnormality seen at urgent care.  You symptoms are thought to be muscular.  Please take medications as prescribed.  Please follow-up with your doctor closely.

## 2019-09-26 NOTE — ED Notes (Signed)
All discharge instructions reviewed with pt, who denies questions at this time. Pt discharged without issue.

## 2019-09-26 NOTE — ED Notes (Signed)
Pt's vitals updated and currently getting dressed for discharge.

## 2019-09-28 ENCOUNTER — Encounter: Payer: Self-pay | Admitting: Orthopaedic Surgery

## 2019-09-28 ENCOUNTER — Ambulatory Visit (INDEPENDENT_AMBULATORY_CARE_PROVIDER_SITE_OTHER): Payer: Self-pay | Admitting: Orthopaedic Surgery

## 2019-09-28 ENCOUNTER — Other Ambulatory Visit: Payer: Self-pay

## 2019-09-28 VITALS — Ht 65.0 in | Wt 274.0 lb

## 2019-09-28 DIAGNOSIS — M25512 Pain in left shoulder: Secondary | ICD-10-CM

## 2019-09-28 MED ORDER — METHYLPREDNISOLONE 4 MG PO TABS
ORAL_TABLET | ORAL | 0 refills | Status: DC
Start: 2019-09-28 — End: 2021-01-23

## 2019-09-28 MED FILL — ?METHYLPREDNISOLONE 4MG TAB: 4 | 6 days supply | Qty: 21 | Fill #0

## 2019-09-28 NOTE — Progress Notes (Signed)
The patient comes in today with acute left shoulder pain.  She has some will be following for her right shoulder as well as both her knees.  She is morbidly obese with a BMI of over 45.  She actually went to the emergency room this past weekend after she had been to urgent care and they had noted some EKG changes.  Apparently she had a full work-up in the emergency room and was not found to have any type of cardiac issues.  They felt was musculoskeletal and gave her an injection of Toradol and some Flexeril.  She is still reporting some anterior shoulder pain today.  On exam I can easily put her left shoulder through full range of motion but it is painful to her.  Is not severe and there is no weakness in the shoulder.  I did offer her an intra-articular steroid injection left shoulder or least a subacromial injection.  She has deferred this.  We can only see if we can calm down her symptoms with a 6-day steroid taper.  From a total knee standpoint.  We still cannot proceed with anything due to her morbid obesity until she gets a BMI below 40.  I would like to see her back in 4 weeks for repeat weight and BMI calculation.  All questions and concerns were answered addressed.  We'll also see what her left shoulder is feeling like at that visit.

## 2019-09-30 ENCOUNTER — Ambulatory Visit: Payer: Self-pay

## 2019-10-02 ENCOUNTER — Ambulatory Visit: Payer: Self-pay

## 2019-10-02 ENCOUNTER — Telehealth: Payer: Self-pay

## 2019-10-02 NOTE — Telephone Encounter (Signed)
Pt was informed of recent attendance record with 2 cancels and 2 no shows. Pt was reminded of upcoming appt on 10/06/19 at 11:45 and that another no show would result in the cancellations of future appts, and appts. will then only be scheduled 1 at a time.

## 2019-10-06 ENCOUNTER — Encounter: Payer: Self-pay | Admitting: Physical Therapy

## 2019-10-06 ENCOUNTER — Other Ambulatory Visit: Payer: Self-pay

## 2019-10-06 ENCOUNTER — Ambulatory Visit: Payer: Self-pay | Admitting: Physical Therapy

## 2019-10-06 DIAGNOSIS — M6281 Muscle weakness (generalized): Secondary | ICD-10-CM

## 2019-10-06 DIAGNOSIS — M25611 Stiffness of right shoulder, not elsewhere classified: Secondary | ICD-10-CM

## 2019-10-06 DIAGNOSIS — R293 Abnormal posture: Secondary | ICD-10-CM

## 2019-10-06 DIAGNOSIS — M25511 Pain in right shoulder: Secondary | ICD-10-CM

## 2019-10-07 ENCOUNTER — Encounter: Payer: Self-pay | Admitting: Physical Therapy

## 2019-10-07 NOTE — Therapy (Signed)
[facility] [address] [place], [place], [address] Phone: [phone]   Fax:  [phone]  Physical Therapy Treatment  Patient Details  Name: Jenna [name] MRN: [id number] Date of Birth: [birth year] Referring Provider (PT): Kirtland Bouchard, [place]   Encounter Date: 10/06/2019   PT End of Session - 10/06/19 1206    Visit Number 2    Number of Visits 12    Date for PT Re-Evaluation 10/28/19    PT Start Time 1147    PT Stop Time 1230    PT Time Calculation (min) 43 min    Activity Tolerance Patient tolerated treatment well    Behavior During Therapy [facility] for tasks assessed/performed           Past Medical History:  Diagnosis Date  . Arthritis   . Back pain   . Chest pain   . Depression   . Edema of both lower extremities   . High blood pressure   . Joint pain   . Osteoarthritis     Past Surgical History:  Procedure Laterality Date  . CHOLECYSTECTOMY    . TUBAL LIGATION      There were no vitals filed for this visit.   Subjective Assessment - 10/06/19 1155    Subjective Pt reports she is still in a lot of pain. Says she was sore from the exercises the first session. She continues to move her arm at home including in overhead motions, but she says it does hurt.    Patient is accompained by: Family member    Limitations House hold activities;Lifting    Diagnostic tests MRI: Moderate arthritis in shoulder    Patient Stated Goals Decrease pain    Currently in Pain? Yes    Pain Score 4     Pain Location Shoulder    Pain Orientation Left    Pain Descriptors / Indicators Shooting    Pain Type Acute pain    Pain Radiating Towards down to elbow    Pain Onset More than a month ago    Pain Frequency Constant    Aggravating Factors  any arm movement    Pain Relieving Factors muscle relaxer, heat                             [facility] Adult PT Treatment/Exercise - 10/07/19 0001      Exercises    Exercises Shoulder      Shoulder Exercises: Supine   Flexion 10 reps;AAROM   with wand     Shoulder Exercises: Standing   Extension 15 reps   red band   Row 15 reps   red band     Shoulder Exercises: Stretch   Other Shoulder Stretches Upper trap stretch 2x30 sec R only      Modalities   Modalities Iontophoresis      Iontophoresis   Type of Iontophoresis Dexamethasone    Location Biceps tendon    Time 6 hours      Manual Therapy   Manual Therapy Soft tissue mobilization;Joint mobilization    Joint Mobilization Posterior and inferior GH glides Grade I-II    Soft tissue mobilization trigger point release to upper trap                  PT Education - 10/06/19 1310    Education Details HEP and symptom management    Person(s) Educated Patient    Methods Explanation;Demonstration;Tactile cues;Verbal  cues;Handout    Comprehension Tactile cues required;Returned demonstration;Verbal cues required;Verbalized understanding            PT Short Term Goals - 10/07/19 0930      PT SHORT TERM GOAL #1   Title Pt will have decreased rounded shoulders by ~1cm in supine    Baseline 11.5 cm on R, 9 cm on L (measuring acromion to table)    Time 3    Period Weeks    Status New    Target Date 10/07/19             PT Long Term Goals - 09/16/19 1252      PT LONG TERM GOAL #1   Title Pt will be independent with her HEP    Baseline Limited    Time 6    Period Weeks    Status New    Target Date 10/28/19      PT LONG TERM GOAL #2   Title Pt will have complete pain free ROM in all directions for overhead tasks    Baseline Flexion ~90 deg, abduction ~100-110 deg until pain    Time 6    Period Weeks    Status New    Target Date 10/28/19      PT LONG TERM GOAL #3   Title Pt will be able to lift a gallon of milk without pain    Baseline Unable    Time 6    Period Weeks    Status New    Target Date 10/28/19      PT LONG TERM GOAL #4   Title Pt will have improved FOTO  score to 39%    Baseline 60% limitation    Time 6    Period Weeks    Status New    Target Date 10/28/19                 Plan - 10/06/19 1311    Clinical Impression Statement Pt presents with continued shoulder pain and neck tightness. Therapy did STW to release trigger points in the upper traps. Posterior and inferior GH glides were done to improve GH motion and decrease pain. Pt stated seeing dots when laying down, BP was taken at 130/88. Therapy reviewed upper trap stretch to decrease tightness in the muscle. Educated patient on the importance of correct posture, pulling her shoulders back, to increase space in shoulder joint.    Comorbidities arthritis, osteoporosis, depression    Examination-Activity Limitations Bathing;Dressing;Reach Overhead;Transfers    Examination-Participation Restrictions Cleaning;Community Activity;Driving;Laundry    Stability/Clinical Decision Making Evolving/Moderate complexity    Clinical Decision Making Moderate    Rehab Potential Good    PT Frequency 2x / week    PT Duration 6 weeks    PT Treatment/Interventions ADLs/Self Care Home Management;Cryotherapy;Electrical Stimulation;Iontophoresis 4mg /ml Dexamethasone;Moist Heat;Ultrasound;Functional mobility training;Therapeutic activities;Therapeutic exercise;Neuromuscular re-education;Patient/family education;Manual techniques;Passive range of motion;Dry needling;Taping;Vasopneumatic Device    PT Next Visit Plan Assess response to HEP, assess cervical & thoracic spine. Provide manual therapy as indicated for tender upper trap, cervicothoracic spine, and shoulders. Stretch pecs, upper trap and levators, check lat tightness. Strengthen scapular muscles. Consider taping, may benefit from DN.    PT Home Exercise Plan [id number] ; rows, shoulder extension, upper trap stretch    Consulted and Agree with Plan of Care Patient           Patient will benefit from skilled therapeutic intervention in order to improve  the following deficits and impairments:  Decreased coordination,  Decreased range of motion, Increased fascial restricitons, Increased muscle spasms, Impaired UE functional use, Decreased activity tolerance, Pain, Hypomobility, Impaired flexibility, Decreased mobility, Decreased strength, Postural dysfunction  Visit Diagnosis: Chronic right shoulder pain  Stiffness of right shoulder, not elsewhere classified  Abnormal posture  Muscle weakness (generalized)     Problem List Patient Active Problem List   Diagnosis Date Noted  . Insulin resistance 07/28/2019  . Depression 07/28/2019  . Vitamin D deficiency 12/09/2018  . Class 3 severe obesity with serious comorbidity and body mass index (BMI) of 40.0 to 44.9 in adult (HCC) 01/17/2017  . Low back pain 05/17/2016  . Unilateral primary osteoarthritis, left knee 05/08/2016  . Primary osteoarthritis of both knees 06/27/2015  . Closed right fibular fracture 08/10/2014    Dessie Coma PT DPT  10/07/2019, 9:31 AM  Lowell Bouton SPT  10/06/2019  During this treatment session, the therapist was present, participating in and directing the treatment.  [facility] Out[facility] [address] [place], [place], [address] Phone: [phone]   Fax:  [phone]  Name: Jenna [name] MRN: [id number] Date of Birth: October 30, 1970

## 2019-10-08 ENCOUNTER — Ambulatory Visit: Payer: Self-pay | Admitting: Physical Therapy

## 2019-10-12 ENCOUNTER — Ambulatory Visit: Payer: Self-pay | Attending: Physician Assistant | Admitting: Physical Therapy

## 2019-10-12 ENCOUNTER — Encounter: Payer: Self-pay | Admitting: Physical Therapy

## 2019-10-12 ENCOUNTER — Other Ambulatory Visit: Payer: Self-pay

## 2019-10-12 DIAGNOSIS — M25611 Stiffness of right shoulder, not elsewhere classified: Secondary | ICD-10-CM | POA: Insufficient documentation

## 2019-10-12 DIAGNOSIS — G8929 Other chronic pain: Secondary | ICD-10-CM | POA: Insufficient documentation

## 2019-10-12 DIAGNOSIS — R293 Abnormal posture: Secondary | ICD-10-CM | POA: Insufficient documentation

## 2019-10-12 DIAGNOSIS — M6281 Muscle weakness (generalized): Secondary | ICD-10-CM | POA: Insufficient documentation

## 2019-10-12 DIAGNOSIS — M25511 Pain in right shoulder: Secondary | ICD-10-CM | POA: Insufficient documentation

## 2019-10-12 NOTE — Therapy (Signed)
[facility] [address] [place], [place], [address] Phone: [phone]   Fax:  [phone]  Physical Therapy Treatment  Patient Details  Name: Jenna [name] MRN: [id number] Date of Birth: February 17, 1971 Referring Provider (PT): Kirtland Bouchard, [place]   Encounter Date: 10/12/2019   PT End of Session - 10/12/19 1227    Visit Number 3    Number of Visits 12    Date for PT Re-Evaluation 10/28/19    PT Start Time 1150    PT Stop Time 1231    PT Time Calculation (min) 41 min    Activity Tolerance Patient tolerated treatment well    Behavior During Therapy [facility] for tasks assessed/performed           Past Medical History:  Diagnosis Date  . Arthritis   . Back pain   . Chest pain   . Depression   . Edema of both lower extremities   . High blood pressure   . Joint pain   . Osteoarthritis     Past Surgical History:  Procedure Laterality Date  . CHOLECYSTECTOMY    . TUBAL LIGATION      There were no vitals filed for this visit.   Subjective Assessment - 10/12/19 1157    Subjective Pt reports she is still in pain. She says she is still doing activities at home, even moving her shoulder into pain. She has not been doing the exercises at home.    Patient is accompained by: Family member    Limitations House hold activities;Lifting    Diagnostic tests MRI: Moderate arthritis in shoulder    Patient Stated Goals Decrease pain    Currently in Pain? Yes    Pain Score 5     Pain Location Shoulder    Pain Orientation Left    Pain Descriptors / Indicators Shooting    Pain Type Acute pain    Pain Radiating Towards down to elbow    Pain Onset More than a month ago    Pain Frequency Constant              [facility] PT Assessment - 10/12/19 0001      Assessment   Medical Diagnosis M75.41 (ICD-10-CM) - Impingement syndrome of right shoulder    Referring Provider (PT) Marcella Dubs                          [facility] Adult PT Treatment/Exercise - 10/12/19 0001      Shoulder Exercises: Supine   Flexion 10 reps;AAROM   x 2 sets     Shoulder Exercises: Sidelying   External Rotation Limitations 1x10 reps no weight; 1x10 1lb       Shoulder Exercises: Standing   Extension Limitations 2x10 red band    Row Limitations 2x10 red band      Shoulder Exercises: Pulleys   Flexion 2 minutes      Iontophoresis   Type of Iontophoresis Dexamethasone    Location Biceps tendon    Time 6 hours      Manual Therapy   Manual Therapy Joint mobilization    Joint Mobilization Posterior and inferior GH glides Grade I-II                  PT Education - 10/12/19 1249    Education Details HEP; symptom management, A&P of the shoulder joint    Person(s) Educated Patient    Methods Explanation;Demonstration;Tactile cues;Verbal  cues    Comprehension Verbalized understanding;Returned demonstration;Verbal cues required;Tactile cues required            PT Short Term Goals - 10/07/19 0930      PT SHORT TERM GOAL #1   Title Pt will have decreased rounded shoulders by ~1cm in supine    Baseline 11.5 cm on R, 9 cm on L (measuring acromion to table)    Time 3    Period Weeks    Status New    Target Date 10/07/19             PT Long Term Goals - 09/16/19 1252      PT LONG TERM GOAL #1   Title Pt will be independent with her HEP    Baseline Limited    Time 6    Period Weeks    Status New    Target Date 10/28/19      PT LONG TERM GOAL #2   Title Pt will have complete pain free ROM in all directions for overhead tasks    Baseline Flexion ~90 deg, abduction ~100-110 deg until pain    Time 6    Period Weeks    Status New    Target Date 10/28/19      PT LONG TERM GOAL #3   Title Pt will be able to lift a gallon of milk without pain    Baseline Unable    Time 6    Period Weeks    Status New    Target Date 10/28/19      PT LONG TERM GOAL #4   Title Pt will  have improved FOTO score to 39%    Baseline 60% limitation    Time 6    Period Weeks    Status New    Target Date 10/28/19                 Plan - 10/12/19 1253    Clinical Impression Statement Pt continues to present with shoulder pain in areas of the biceps long head tendon and supraspinatus tendon. Therapy did inferior and posterior shoulder glides to increase motion of GH joint and increase joint space. Therapy educated patient on monitoring her pain at home and limiting overhead motions which cause pain. Therapy continued to work on shoulder and posterior chain strengthening in symptom free range to improve rounded shoulder posture.    Personal Factors and Comorbidities Comorbidity 1;Time since onset of injury/illness/exacerbation;Comorbidity 2    Comorbidities arthritis, osteoporosis, depression    Examination-Activity Limitations Bathing;Dressing;Reach Overhead;Transfers    Examination-Participation Restrictions Cleaning;Community Activity;Driving;Laundry    Stability/Clinical Decision Making Evolving/Moderate complexity    Clinical Decision Making Moderate    Rehab Potential Good    PT Frequency 2x / week    PT Duration 6 weeks    PT Treatment/Interventions ADLs/Self Care Home Management;Cryotherapy;Electrical Stimulation;Iontophoresis 4mg /ml Dexamethasone;Moist Heat;Ultrasound;Functional mobility training;Therapeutic activities;Therapeutic exercise;Neuromuscular re-education;Patient/family education;Manual techniques;Passive range of motion;Dry needling;Taping;Vasopneumatic Device    PT Next Visit Plan Assess response to HEP, assess cervical & thoracic spine. Provide manual therapy as indicated for tender upper trap, cervicothoracic spine, and shoulders. Stretch pecs, upper trap and levators, check lat tightness. Strengthen scapular muscles. Consider taping, may benefit from DN.    PT Home Exercise Plan [id number] ; rows, shoulder extension, upper trap stretch    Consulted and  Agree with Plan of Care Patient           Patient will benefit from skilled therapeutic intervention in order to  improve the following deficits and impairments:  Decreased coordination, Decreased range of motion, Increased fascial restricitons, Increased muscle spasms, Impaired UE functional use, Decreased activity tolerance, Pain, Hypomobility, Impaired flexibility, Decreased mobility, Decreased strength, Postural dysfunction  Visit Diagnosis: Chronic right shoulder pain  Abnormal posture  Muscle weakness (generalized)     Problem List Patient Active Problem List   Diagnosis Date Noted  . Insulin resistance 07/28/2019  . Depression 07/28/2019  . Vitamin D deficiency 12/09/2018  . Class 3 severe obesity with serious comorbidity and body mass index (BMI) of 40.0 to 44.9 in adult (HCC) 01/17/2017  . Low back pain 05/17/2016  . Unilateral primary osteoarthritis, left knee 05/08/2016  . Primary osteoarthritis of both knees 06/27/2015  . Closed right fibular fracture 08/10/2014    Dessie Coma  PT DPT  10/12/2019, 2:34 PM   Lowell Bouton SPT  10/12/2019  During this treatment session, the therapist was present, participating in and directing the treatment.   [facility] Outpatient Rehabilitation [facility] [address] [place], [place], [address] Phone: [phone]   Fax:  [phone]  Name: Jenna [name] MRN: [id number] Date of Birth: 01-10-1971

## 2019-10-14 ENCOUNTER — Ambulatory Visit: Payer: Self-pay

## 2019-10-14 ENCOUNTER — Other Ambulatory Visit: Payer: Self-pay

## 2019-10-14 DIAGNOSIS — M6281 Muscle weakness (generalized): Secondary | ICD-10-CM

## 2019-10-14 DIAGNOSIS — R293 Abnormal posture: Secondary | ICD-10-CM

## 2019-10-14 DIAGNOSIS — M25611 Stiffness of right shoulder, not elsewhere classified: Secondary | ICD-10-CM

## 2019-10-14 DIAGNOSIS — G8929 Other chronic pain: Secondary | ICD-10-CM

## 2019-10-14 NOTE — Therapy (Signed)
[facility] Outpatient Rehabilitation [facility] [address] [place], [place], [address] Phone: [phone]   Fax:  [phone]  Physical Therapy Treatment  Patient Details  Name: Jenna [name] MRN: [id number] Date of Birth: 06/08/1970 Referring Provider (PT): Kirtland Bouchard, [place]   Encounter Date: 10/14/2019   PT End of Session - 10/14/19 2115    Visit Number 4    Number of Visits 12    Date for PT Re-Evaluation 10/28/19    PT Start Time 1137    PT Stop Time 1220    PT Time Calculation (min) 43 min    Activity Tolerance Patient tolerated treatment well    Behavior During Therapy [facility] for tasks assessed/performed           Past Medical History:  Diagnosis Date   Arthritis    Back pain    Chest pain    Depression    Edema of both lower extremities    High blood pressure    Joint pain    Osteoarthritis     Past Surgical History:  Procedure Laterality Date   CHOLECYSTECTOMY     TUBAL LIGATION      There were no vitals filed for this visit.   Subjective Assessment - 10/14/19 1437    Subjective Pt reports she is having a good day and her R shoulder pain is low. She reports pain yesterday was higher at 5/10. Pt reports difficulty with r shoulder pain with sleeping.    Limitations House hold activities;Lifting    Diagnostic tests MRI: Moderate arthritis in shoulder    Patient Stated Goals Decrease pain    Currently in Pain? Yes    Pain Score 3     Pain Location Shoulder    Pain Orientation Right    Pain Descriptors / Indicators Shooting;Aching    Pain Type Acute pain    Pain Radiating Towards to elbow    Pain Onset More than a month ago    Pain Frequency Constant                             [facility] Adult PT Treatment/Exercise - 10/14/19 0001      Self-Care   Self-Care Other Self-Care Comments    Other Self-Care Comments  See Education      Exercises   Exercises Shoulder      Shoulder Exercises: Sidelying    External Rotation Limitations 1x10 reps no weight; 1x10 1lb       Shoulder Exercises: Standing   Extension Limitations 2x10 red band    Row Limitations 2x10 red band      Shoulder Exercises: Stretch   Other Shoulder Stretches Upper trap stretch 2x30 sec R only      Modalities   Modalities Iontophoresis      Iontophoresis   Type of Iontophoresis Dexamethasone    Location Biceps tendon    Time 6 hours      Manual Therapy   Manual Therapy Joint mobilization    Joint Mobilization Posterior and inferior GH glides Grade 2    Soft tissue mobilization Cross fricition massage to the ant. GH area                  PT Education - 10/14/19 2114    Education Details HEP: Pendulum was added to HEP as a tool for pain management c GH distraction. Instructed pt in sleeping positions and R UE support to assist pain  management for more comfortable sleep. Instructed pt in cross friction massage to the ant. GH area for tendonitis management. Recommended pt completing 3x daily as tolerated.    Person(s) Educated Patient    Methods Explanation;Demonstration;Tactile cues;Verbal cues;Handout    Comprehension Verbalized understanding;Returned demonstration;Verbal cues required            PT Short Term Goals - 10/07/19 0930      PT SHORT TERM GOAL #1   Title Pt will have decreased rounded shoulders by ~1cm in supine    Baseline 11.5 cm on R, 9 cm on L (measuring acromion to table)    Time 3    Period Weeks    Status New    Target Date 10/07/19             PT Long Term Goals - 09/16/19 1252      PT LONG TERM GOAL #1   Title Pt will be independent with her HEP    Baseline Limited    Time 6    Period Weeks    Status New    Target Date 10/28/19      PT LONG TERM GOAL #2   Title Pt will have complete pain free ROM in all directions for overhead tasks    Baseline Flexion ~90 deg, abduction ~100-110 deg until pain    Time 6    Period Weeks    Status New    Target Date 10/28/19       PT LONG TERM GOAL #3   Title Pt will be able to lift a gallon of milk without pain    Baseline Unable    Time 6    Period Weeks    Status New    Target Date 10/28/19      PT LONG TERM GOAL #4   Title Pt will have improved FOTO score to 39%    Baseline 60% limitation    Time 6    Period Weeks    Status New    Target Date 10/28/19                 Plan - 10/14/19 2145    Clinical Impression Statement PT focused on pain management with recommended positions for sleeping c UE support, sue of pendulum exs for [facility] distraction and use of cross friction massage for tendonitis. Strengthening for the shouder girdle was continued as well as iontophoresis which the pt reposts as being helpful c pain.    Personal Factors and Comorbidities Comorbidity 1;Time since onset of injury/illness/exacerbation;Comorbidity 2    Comorbidities arthritis, osteoporosis, depression    Examination-Activity Limitations Bathing;Dressing;Reach Overhead;Transfers    Examination-Participation Restrictions Cleaning;Community Activity;Driving;Laundry    Stability/Clinical Decision Making Evolving/Moderate complexity    Clinical Decision Making Moderate    Rehab Potential Good    PT Frequency 2x / week    PT Duration 6 weeks    PT Treatment/Interventions ADLs/Self Care Home Management;Cryotherapy;Electrical Stimulation;Iontophoresis 4mg /ml Dexamethasone;Moist Heat;Ultrasound;Functional mobility training;Therapeutic activities;Therapeutic exercise;Neuromuscular re-education;Patient/family education;Manual techniques;Passive range of motion;Dry needling;Taping;Vasopneumatic Device    PT Next Visit Plan Assess response to HEP, assess cervical & thoracic spine. Provide manual therapy as indicated for tender upper trap, cervicothoracic spine, and shoulders. Stretch pecs, upper trap and levators, check lat tightness. Strengthen scapular muscles. Consider taping, may benefit from DN.    PT Home Exercise Plan [id number] ;  rows, shoulder extension, upper trap stretch    Consulted and Agree with Plan of Care Patient  Patient will benefit from skilled therapeutic intervention in order to improve the following deficits and impairments:  Decreased coordination, Decreased range of motion, Increased fascial restricitons, Increased muscle spasms, Impaired UE functional use, Decreased activity tolerance, Pain, Hypomobility, Impaired flexibility, Decreased mobility, Decreased strength, Postural dysfunction  Visit Diagnosis: Chronic right shoulder pain  Abnormal posture  Muscle weakness (generalized)  Stiffness of right shoulder, not elsewhere classified     Problem List Patient Active Problem List   Diagnosis Date Noted   Insulin resistance 07/28/2019   Depression 07/28/2019   Vitamin D deficiency 12/09/2018   Class 3 severe obesity with serious comorbidity and body mass index (BMI) of 40.0 to 44.9 in adult (HCC) 01/17/2017   Low back pain 05/17/2016   Unilateral primary osteoarthritis, left knee 05/08/2016   Primary osteoarthritis of both knees 06/27/2015   Closed right fibular fracture 08/10/2014   Joellyn Rued MS, PT 10/14/19 9:56 PM  [facility] [address] [place], [place], [address] Phone: [phone]   Fax:  [phone]  Name: Fredna Stricker MRN: [id number] Date of Birth: Jul 29, 1970

## 2019-10-19 ENCOUNTER — Ambulatory Visit: Payer: Self-pay | Admitting: Physical Therapy

## 2019-10-21 ENCOUNTER — Other Ambulatory Visit: Payer: Self-pay

## 2019-10-21 ENCOUNTER — Ambulatory Visit: Payer: Self-pay

## 2019-10-21 DIAGNOSIS — R293 Abnormal posture: Secondary | ICD-10-CM

## 2019-10-21 DIAGNOSIS — G8929 Other chronic pain: Secondary | ICD-10-CM

## 2019-10-21 DIAGNOSIS — M25611 Stiffness of right shoulder, not elsewhere classified: Secondary | ICD-10-CM

## 2019-10-21 DIAGNOSIS — M6281 Muscle weakness (generalized): Secondary | ICD-10-CM

## 2019-10-22 NOTE — Therapy (Addendum)
[place], [place], [address] Phone: [phone]   Fax:  [phone]  Physical Therapy Treatment and Discharge  Patient Details  Name: Jenna [name] MRN: [id number] Date of Birth: [birth year] Referring Provider (PT): Pete Pelt, [place]   Encounter Date: 10/21/2019   PT End of Session - 10/22/19 0832    Visit Number 5    Number of Visits 12    Date for PT Re-Evaluation 10/28/19    PT Start Time 0017    PT Stop Time 1219    PT Time Calculation (min) 43 min    Activity Tolerance Patient tolerated treatment well    Behavior During Therapy [facility] for tasks assessed/performed           Past Medical History:  Diagnosis Date  . Arthritis   . Back pain   . Chest pain   . Depression   . Edema of both lower extremities   . High blood pressure   . Joint pain   . Osteoarthritis     Past Surgical History:  Procedure Laterality Date  . CHOLECYSTECTOMY    . TUBAL LIGATION      There were no vitals filed for this visit.   Subjective Assessment - 10/21/19 1143    Subjective Pt reports some aching with her R shoulder.    Diagnostic tests MRI: Moderate arthritis in shoulder    Patient Stated Goals Decrease pain    Currently in Pain? Yes    Pain Score 3     Pain Location Shoulder    Pain Orientation Right    Pain Descriptors / Indicators Aching    Pain Type Acute pain    Pain Onset More than a month ago    Pain Frequency Constant    Aggravating Factors  arm movements    Pain Relieving Factors muscle relaxor, heat              [facility] PT Assessment - 10/22/19 0001      AROM   Right Shoulder Flexion 120 Degrees    Right Shoulder ABduction 115 Degrees                         [facility] Adult PT Treatment/Exercise - 10/22/19 0001      Exercises   Exercises Shoulder;Neck      Shoulder Exercises: Supine   Protraction Strengthening;Both;10 reps    Protraction Weight (lbs) none     Protraction Limitations 2 sets; seratus punch    ABduction Strengthening;Both;10 reps    Theraband Level (Shoulder ABduction) Level 2 (Red)    ABduction Limitations horizonal abd      Shoulder Exercises: Seated   Retraction Strengthening;AROM;Both;10 reps      Shoulder Exercises: Standing   External Rotation Strengthening;5 reps    Theraband Level (Shoulder External Rotation) Level 2 (Red)    External Rotation Limitations DCed due to bicep tendon pain    Row Limitations 2x10 green band      Neck Exercises: Stretches   Upper Trapezius Stretch Right;3 reps;20 seconds    Upper Trapezius Stretch Limitations with GH jt inf. distraction    Levator Stretch Right;2 reps;20 seconds    Corner Stretch 2 reps;20 seconds    Corner Stretch Limitations 60d; 90d    Other Neck Stretches SCM stretch 2x; 20 sec; Rt                  PT Education - 10/22/19  0615    Education Details HEP modified    Person(s) Educated Patient    Methods Explanation;Demonstration;Tactile cues;Verbal cues;Handout    Comprehension Verbalized understanding;Returned demonstration;Tactile cues required;Verbal cues required;Need further instruction            PT Short Term Goals - 10/07/19 0930      PT SHORT TERM GOAL #1   Title Pt will have decreased rounded shoulders by ~1cm in supine    Baseline 11.5 cm on R, 9 cm on L (measuring acromion to table)    Time 3    Period Weeks    Status New    Target Date 10/07/19             PT Long Term Goals - 09/16/19 1252      PT LONG TERM GOAL #1   Title Pt will be independent with her HEP    Baseline Limited    Time 6    Period Weeks    Status New    Target Date 10/28/19      PT LONG TERM GOAL #2   Title Pt will have complete pain free ROM in all directions for overhead tasks    Baseline Flexion ~90 deg, abduction ~100-110 deg until pain    Time 6    Period Weeks    Status New    Target Date 10/28/19      PT LONG TERM GOAL #3   Title Pt will be  able to lift a gallon of milk without pain    Baseline Unable    Time 6    Period Weeks    Status New    Target Date 10/28/19      PT LONG TERM GOAL #4   Title Pt will have improved FOTO score to 39%    Baseline 60% limitation    Time 6    Period Weeks    Status New    Target Date 10/28/19                 Plan - 10/22/19 0617    Clinical Impression Statement Pt had skin irritation from ionto. and was held today. Continued with ther ex to address shoulder pain symptomatic of impingement/bicep tendinitis with exs. modified due some irritating the bicep tendon. R shoulder flex has improved from 92d to 120d, and abd from 110d to 115d.    Personal Factors and Comorbidities Comorbidity 1;Time since onset of injury/illness/exacerbation;Comorbidity 2    Comorbidities arthritis, osteoporosis, depression    Examination-Activity Limitations Bathing;Dressing;Reach Overhead;Transfers    Examination-Participation Restrictions Cleaning;Community Activity;Driving;Laundry    Stability/Clinical Decision Making Evolving/Moderate complexity    Clinical Decision Making Moderate    Rehab Potential Good    PT Frequency 2x / week    PT Duration 6 weeks    PT Treatment/Interventions ADLs/Self Care Home Management;Cryotherapy;Electrical Stimulation;Iontophoresis 63m/ml Dexamethasone;Moist Heat;Ultrasound;Functional mobility training;Therapeutic activities;Therapeutic exercise;Neuromuscular re-education;Patient/family education;Manual techniques;Passive range of motion;Dry needling;Taping;Vasopneumatic Device    PT Next Visit Plan Assess resonse to HEP modifications. Continue ionto as indicated. Check skin irritation..    PT Home Exercise Plan [id number] ; rows, shoulder extension, upper trap stretch; DC Tband sh ER; added supine horizonal abd c Tband and serratus punch.    Consulted and Agree with Plan of Care Patient           PHYSICAL THERAPY DISCHARGE SUMMARY  Visits from Start of Care:  5  Current functional level related to goals / functional outcomes: See above   Remaining deficits:  See above   Education / Equipment: See above  Plan:                                                    Patient goals were not met. Patient is being discharged due to not returning since the last visit.  ?????         Patient will benefit from skilled therapeutic intervention in order to improve the following deficits and impairments:  Decreased coordination, Decreased range of motion, Increased fascial restricitons, Increased muscle spasms, Impaired UE functional use, Decreased activity tolerance, Pain, Hypomobility, Impaired flexibility, Decreased mobility, Decreased strength, Postural dysfunction  Visit Diagnosis: Chronic right shoulder pain  Abnormal posture  Muscle weakness (generalized)  Stiffness of right shoulder, not elsewhere classified     Problem List Patient Active Problem List   Diagnosis Date Noted  . Insulin resistance 07/28/2019  . Depression 07/28/2019  . Vitamin D deficiency 12/09/2018  . Class 3 severe obesity with serious comorbidity and body mass index (BMI) of 40.0 to 44.9 in adult ([place]) 01/17/2017  . Low back pain 05/17/2016  . Unilateral primary osteoarthritis, left knee 05/08/2016  . Primary osteoarthritis of both knees 06/27/2015  . Closed right fibular fracture 08/10/2014    Gar Ponto MS, PT 10/22/19 8:35 AM   Hinton Dyer, [place], DPT 12/17/2019 3:07 PM  [facility] [address] [place], [place], [address] Phone: [phone]   Fax:  [phone]  Name: Karlisa Gaubert MRN: [id number] Date of Birth: 03-28-1970

## 2019-10-26 ENCOUNTER — Ambulatory Visit: Payer: Self-pay | Admitting: Orthopaedic Surgery

## 2019-10-26 ENCOUNTER — Ambulatory Visit: Payer: Self-pay | Admitting: Physical Therapy

## 2019-10-28 ENCOUNTER — Telehealth: Payer: Self-pay | Admitting: Physical Therapy

## 2019-10-28 ENCOUNTER — Ambulatory Visit: Payer: Self-pay | Admitting: Physical Therapy

## 2019-10-28 NOTE — Telephone Encounter (Signed)
Talked to pt in regards to her missed 11:30 am appointment.   Pt states she thought her appointment was tomorrow (she thought today was Tuesday) and is dealing with random ankle pain and swelling. Pt informed that she has no other appointments scheduled with therapy and she is free to call to schedule more appointments or if she no longer feels she needs PT services. Pt verbalizes understanding.  Qunisha Bryk April Dell Ponto, PT, DPT

## 2019-11-04 ENCOUNTER — Encounter: Payer: Self-pay | Admitting: Orthopaedic Surgery

## 2019-11-04 ENCOUNTER — Ambulatory Visit (INDEPENDENT_AMBULATORY_CARE_PROVIDER_SITE_OTHER): Payer: Self-pay | Admitting: Orthopaedic Surgery

## 2019-11-04 VITALS — Ht 65.0 in | Wt 280.0 lb

## 2019-11-04 DIAGNOSIS — M25511 Pain in right shoulder: Secondary | ICD-10-CM

## 2019-11-04 DIAGNOSIS — M1712 Unilateral primary osteoarthritis, left knee: Secondary | ICD-10-CM

## 2019-11-04 DIAGNOSIS — M25512 Pain in left shoulder: Secondary | ICD-10-CM

## 2019-11-04 NOTE — Progress Notes (Signed)
Office Visit Note   Patient: Jenna [name]           Date of Birth: May 19, 1970           MRN: [id number] Visit Date: 11/04/2019              Requested by: Cain Saupe, MD [address] [place],  [place] [address] PCP: Cain Saupe, MD   Assessment & Plan: Visit Diagnoses:  1. Acute pain of left shoulder   2. Acute pain of right shoulder   3. Unilateral primary osteoarthritis, left knee     Plan: She will return to the weight loss clinic and discuss with them her diet and how they can assist her in losing weight.  Recommend she continue her home exercise program for her shoulders and also for cardiovascular.  She will follow-up with [place] in 3 months for repeat height and weight.  Questions were encouraged and answered.  Follow-Up Instructions: Return in about 3 months (around 02/04/2020).   Orders:  No orders of the defined types were placed in this encounter.  No orders of the defined types were placed in this encounter.     Procedures: No procedures performed   Clinical Data: No additional findings.   Subjective: No chief complaint on file.   HPI HPI: Jenna [name] returns today follow-up of her left shoulder and also to discuss her weight and possible left knee replacement.  Again she has severe arthritis of the left knee.  She continues to work on weight loss.  She is exercising on her own and dieting.  She has not been to weight loss clinic lately.  Regards to both shoulders she states her shoulders are doing well she is doing home exercise program she does feel that the Medrol Dosepak helped with these. Review of Systems See HPI otherwise negative  Objective: Vital Signs: Ht 5\' 5"  (1.651 m)   Wt 280 lb (127 kg)   BMI 46.59 kg/m   Physical Exam Constitutional:      Appearance: She is not ill-appearing or diaphoretic.  Pulmonary:     Effort: Pulmonary effort is normal.  Neurological:     Mental Status: She is alert and oriented to person, place, and  time.  Psychiatric:        Mood and Affect: Mood normal.     Ortho Exam Bilateral shoulders full overhead activity without pain.  Impingement testing bilaterally is negative.  External and internal rotation against resistance reveals 5 5 strength bilaterally. Specialty Comments:  No specialty comments available.  Imaging: No results found.   PMFS History: Patient Active Problem List   Diagnosis Date Noted  . Insulin resistance 07/28/2019  . Depression 07/28/2019  . Vitamin D deficiency 12/09/2018  . Class 3 severe obesity with serious comorbidity and body mass index (BMI) of 40.0 to 44.9 in adult ([facility]) 01/17/2017  . Low back pain 05/17/2016  . Unilateral primary osteoarthritis, left knee 05/08/2016  . Primary osteoarthritis of both knees 06/27/2015  . Closed right fibular fracture 08/10/2014   Past Medical History:  Diagnosis Date  . Arthritis   . Back pain   . Chest pain   . Depression   . Edema of both lower extremities   . High blood pressure   . Joint pain   . Osteoarthritis     Family History  Problem Relation Age of Onset  . Diabetes Mother   . Hypertension Mother     Past Surgical History:  Procedure Laterality  Date  . CHOLECYSTECTOMY    . TUBAL LIGATION     Social History   Occupational History  . Occupation: Stay at home  Tobacco Use  . Smoking status: Current Every Day Smoker    Packs/day: 0.10    Types: Cigarettes  . Smokeless tobacco: Never Used  Vaping Use  . Vaping Use: Never used  Substance and Sexual Activity  . Alcohol use: No    Alcohol/week: 0.0 standard drinks  . Drug use: No  . Sexual activity: Not on file

## 2020-01-21 ENCOUNTER — Other Ambulatory Visit: Payer: Self-pay

## 2020-01-21 ENCOUNTER — Ambulatory Visit: Payer: Self-pay | Attending: Physician Assistant | Admitting: Physician Assistant

## 2020-01-21 ENCOUNTER — Encounter: Payer: Self-pay | Admitting: Physician Assistant

## 2020-01-21 VITALS — BP 137/84 | HR 85 | Temp 97.1°F | Ht 65.0 in | Wt 289.0 lb

## 2020-01-21 DIAGNOSIS — R8281 Pyuria: Secondary | ICD-10-CM

## 2020-01-21 DIAGNOSIS — N898 Other specified noninflammatory disorders of vagina: Secondary | ICD-10-CM

## 2020-01-21 DIAGNOSIS — R102 Pelvic and perineal pain: Secondary | ICD-10-CM

## 2020-01-21 DIAGNOSIS — N926 Irregular menstruation, unspecified: Secondary | ICD-10-CM

## 2020-01-21 LAB — POCT URINALYSIS DIP (CLINITEK)
Bilirubin, UA: NEGATIVE
Glucose, UA: NEGATIVE mg/dL
Ketones, POC UA: NEGATIVE mg/dL
Nitrite, UA: NEGATIVE
POC PROTEIN,UA: NEGATIVE
Spec Grav, UA: 1.025 (ref 1.010–1.025)
Urobilinogen, UA: 0.2 E.U./dL
pH, UA: 5.5 (ref 5.0–8.0)

## 2020-01-21 LAB — POCT URINE PREGNANCY: Preg Test, Ur: NEGATIVE

## 2020-01-21 MED ORDER — SULFAMETHOXAZOLE-TRIMETHOPRIM 800-160 MG PO TABS: 1.0000 | ORAL_TABLET | Freq: Two times a day (BID) | ORAL | 0 refills | Status: DC

## 2020-01-21 MED FILL — ?BACTRIM DS 800-160 MG TABS: 5 days supply | Qty: 10 | Fill #0

## 2020-01-21 NOTE — Patient Instructions (Signed)
Drink 80-100 ounces water daily 

## 2020-01-21 NOTE — Progress Notes (Signed)
Patient ID: Jenna [name], female   DOB: 1970-10-14, 49 y.o.   MRN: [id number]    Jenna [name], is a 49 y.o. female  RKY:[id number]  BTD:[id number]  DOB - [birth year]  Subjective:  Chief Complaint and HPI: Jenna [name] is a 49 y.o. female here today 2 periods last month. This has never happened.  Denies new hot flashes or menopausal symptoms.  Periods usu normal.  Monogamous with partner of 4 years.  He does not have symptoms. No fever.  No N/V/D.   Discharge white and yellow.   Vaginal odor for a little over a week.  Some pelvic cramping that feels simoilar to a period but mild in nature.  Also for about 1 week.  No dysuria.     ROS:   Constitutional:  No f/c, No night sweats, No unexplained weight loss. EENT:  No vision changes, No blurry vision, No hearing changes. No mouth, throat, or ear problems.  Respiratory: No cough, No SOB Cardiac: No CP, no palpitations GI:  No abd pain, No N/V/D. GU: No Urinary s/sx Musculoskeletal: No joint pain Neuro: No headache, no dizziness, no motor weakness.  Skin: No rash Endocrine:  No polydipsia. No polyuria.  Psych: Denies SI/HI  No problems updated.  ALLERGIES: No Known Allergies  PAST MEDICAL HISTORY: Past Medical History:  Diagnosis Date  . Arthritis   . Back pain   . Chest pain   . Depression   . Edema of both lower extremities   . High blood pressure   . Joint pain   . Osteoarthritis     MEDICATIONS AT HOME: Prior to Admission medications   Medication Sig Start Date End Date Taking? Authorizing Provider  DULoxetine (CYMBALTA) 60 MG capsule One tablet (60mg ) once per day for back pain and mood Patient taking differently: Take 60 mg by mouth daily.  08/24/19  Yes 08/26/19, Amy J, NP  ferrous sulfate 324 MG TBEC Take 324 mg by mouth every other day.    Yes [provider]  methocarbamol (ROBAXIN) 500 MG tablet Take 1 tablet (500 mg total) by mouth 3 (three) times daily. As needed for muscle spasm Patient taking  differently: Take 500 mg by mouth 3 (three) times daily as needed for muscle spasms.  07/27/18  Yes Fulp, Cammie, MD  methylPREDNISolone (MEDROL) 4 MG tablet Medrol dose pack. Take as instructed 09/28/19  Yes 09/30/19, MD  naproxen sodium (ALEVE) 220 MG tablet Take 220-440 mg by mouth 2 (two) times daily as needed (Pain).   Yes [provider]  cyclobenzaprine (FLEXERIL) 10 MG tablet Take 1 tablet (10 mg total) by mouth 3 (three) times daily as needed for muscle spasms. Patient not taking: Reported on 01/21/2020 09/26/19   09/28/19, PA-C  sulfamethoxazole-trimethoprim (BACTRIM DS) 800-160 MG tablet Take 1 tablet by mouth 2 (two) times daily. 01/21/20   13/11/21, PA-C  Vitamin D, Ergocalciferol, (DRISDOL) 1.25 MG (50000 UNIT) CAPS capsule Take 1 capsule (50,000 Units total) by mouth every 7 (seven) days. Patient not taking: Reported on 01/21/2020 06/18/19   08/18/19 A, DO     Objective:  EXAM:   Vitals:   01/21/20 1405  BP: 137/84  Pulse: 85  Temp: (!) 97.1 F (36.2 C)  TempSrc: Temporal  SpO2: 99%  Weight: 289 lb (131.1 kg)  Height: 5\' 5"  (1.651 m)    General appearance : A&OX3. NAD. Non-toxic-appearing HEENT: Atraumatic and Normocephalic.  PERRLA. EOM intact.   Chest/Lungs:  Breathing-non-labored, Good air  entry bilaterally, breath sounds normal without rales, rhonchi, or wheezing  CVS: S1 S2 regular, no murmurs, gallops, rubs  Abdomen: Bowel sounds present, Non tender and not distended with no gaurding, rigidity or rebound. Extremities: Bilateral Lower Ext shows no edema, both legs are warm to touch with = pulse throughout Neurology:  CN II-XII grossly intact, Non focal.   Psych:  TP linear. J/I WNL. Normal speech. Appropriate eye contact and affect.  Skin:  No Rash  Data Review Lab Results  Component Value Date   HGBA1C 5.5 07/28/2019   HGBA1C 5.4 12/08/2018   HGBA1C 5.6 12/03/2017     Assessment & Plan   1. Vaginal  odor Self-swab/monogamous with partner - Cervicovaginal ancillary only - POCT URINALYSIS DIP (CLINITEK) - POCT urine pregnancy  2. Vaginal discharge - Cervicovaginal ancillary only - POCT URINALYSIS DIP (CLINITEK)  3. Suprapubic cramping Increase water intake - POCT urine pregnancy  4. Abnormal menstrual periods ?peri-menopausal - POCT urine pregnancy  5. Pyuria Increase fluids as discussed - sulfamethoxazole-trimethoprim (BACTRIM DS) 800-160 MG tablet; Take 1 tablet by mouth 2 (two) times daily.  Dispense: 10 tablet; Refill: 0 - Urine Culture  Patient have been counseled extensively about nutrition and exercise  Return in about 6 months (around 07/20/2020), or if symptoms worsen or fail to improve, for PCP.  The patient was given clear instructions to go to ER or return to medical center if symptoms don't improve, worsen or new problems develop. The patient verbalized understanding. The patient was told to call to get lab results if they haven't heard anything in the next week.     Georgian Co, PA-C [facility] [place], [place] [phone]   01/21/2020, 2:59 PM

## 2020-01-22 LAB — CERVICOVAGINAL ANCILLARY ONLY
Bacterial Vaginitis (gardnerella): POSITIVE — AB
Candida Glabrata: NEGATIVE
Candida Vaginitis: NEGATIVE
Chlamydia: NEGATIVE
Comment: NEGATIVE
Comment: NEGATIVE
Comment: NEGATIVE
Comment: NEGATIVE
Comment: NEGATIVE
Comment: NORMAL
Neisseria Gonorrhea: NEGATIVE
Trichomonas: POSITIVE — AB

## 2020-01-24 LAB — URINE CULTURE

## 2020-01-27 ENCOUNTER — Other Ambulatory Visit: Payer: Self-pay | Admitting: Physician Assistant

## 2020-01-27 MED ORDER — METRONIDAZOLE 500 MG PO TABS
500.0000 mg | ORAL_TABLET | Freq: Two times a day (BID) | ORAL | 0 refills | Status: DC
Start: 2020-01-27 — End: 2020-02-11

## 2020-01-27 MED ORDER — FLUCONAZOLE 150 MG PO TABS
150.0000 mg | ORAL_TABLET | Freq: Once | ORAL | 0 refills | Status: DC
Start: 2020-01-27 — End: 2020-01-27

## 2020-01-27 MED FILL — FLUCONAZOLE 150 MG TABLET: 150 | 1 days supply | Qty: 1 | Fill #0

## 2020-01-27 MED FILL — ?METRONIDAZOLE 500 MG TABS: 500 | 7 days supply | Qty: 14 | Fill #0

## 2020-01-28 ENCOUNTER — Other Ambulatory Visit: Payer: Self-pay | Admitting: Family Medicine

## 2020-01-28 DIAGNOSIS — M545 Low back pain, unspecified: Secondary | ICD-10-CM

## 2020-01-28 NOTE — Telephone Encounter (Signed)
Requested medication (s) are due for refill today: no  Requested medication (s) are on the active medication list: yes  Last refill:  11/21/2018  Future visit scheduled: no  Notes to clinic:  this refill cannot be delegated    Requested Prescriptions  Pending Prescriptions Disp Refills   methocarbamol (ROBAXIN) 500 MG tablet [Pharmacy Med Name: METHOCARBAMOL 500 MG TABS 500 Tablet] 90 tablet 3    Sig: Take 1 tablet (500 mg total) by mouth 3 (three) times daily. As needed for muscle spasm      Not Delegated - Analgesics:  Muscle Relaxants Failed - 01/28/2020 10:06 AM      Failed - This refill cannot be delegated      Passed - Valid encounter within last 6 months    Recent Outpatient Visits           1 week ago Vaginal odor   [facility] [place], [place], [place]   5 months ago Ganglion cyst of dorsum of right wrist   [facility] [employer], [place], NP   9 months ago Ganglion cyst of dorsum of right wrist   [facility] [place], [place], MD   11 months ago Rash of foot   [facility] F[place], [place], MD   1 year ago Depressed mood    [facility] [place], [place], MD       Future Appointments             In 1 week Magnus Ivan, Vanita Panda, MD [facility]

## 2020-01-29 MED FILL — ?METHOCARBAMOL 500MG TAB: 500 | 30 days supply | Qty: 90 | Fill #0

## 2020-02-08 ENCOUNTER — Ambulatory Visit (INDEPENDENT_AMBULATORY_CARE_PROVIDER_SITE_OTHER): Payer: Self-pay | Admitting: Orthopaedic Surgery

## 2020-02-08 VITALS — Ht 65.0 in | Wt 284.0 lb

## 2020-02-08 DIAGNOSIS — M1712 Unilateral primary osteoarthritis, left knee: Secondary | ICD-10-CM

## 2020-02-08 NOTE — Progress Notes (Signed)
The patient is a 49 year old female with known significant osteoarthritis of her left knee.  She has tried and failed other forms of conservative treatment.  At some point she does need a knee replacement.  However, she is morbidly obese.  We last weighed her 3 months ago and her weight was 280 pounds.  She has gone up today to a weight of 284 pounds.  Her BMI is 47.26.  That is also increased.  I discussed this with her in detail.  We are not in the position of being able to safely recommend an elective left knee replacement given her weight.  She understands there is a high complication rate with joint replacement surgery and the population of patients above a 40 BMI.  I would not be comfortable with proceeding with surgery until her weight is much less.  All questions and concerns were answered and addressed.  We can see her back in 3 months with a repeat weight and BMI calculation.

## 2020-02-11 ENCOUNTER — Other Ambulatory Visit: Payer: Self-pay

## 2020-02-11 ENCOUNTER — Other Ambulatory Visit: Payer: Self-pay | Admitting: Physician Assistant

## 2020-02-11 ENCOUNTER — Ambulatory Visit: Payer: Self-pay | Attending: Physician Assistant | Admitting: Physician Assistant

## 2020-02-11 ENCOUNTER — Ambulatory Visit: Payer: Self-pay

## 2020-02-11 ENCOUNTER — Encounter: Payer: Self-pay | Admitting: Physician Assistant

## 2020-02-11 VITALS — BP 176/136 | HR 130 | Temp 98.4°F | Resp 18 | Ht 65.0 in | Wt 288.0 lb

## 2020-02-11 DIAGNOSIS — L500 Allergic urticaria: Secondary | ICD-10-CM

## 2020-02-11 DIAGNOSIS — L270 Generalized skin eruption due to drugs and medicaments taken internally: Secondary | ICD-10-CM

## 2020-02-11 DIAGNOSIS — T7840XA Allergy, unspecified, initial encounter: Secondary | ICD-10-CM

## 2020-02-11 DIAGNOSIS — T50905A Adverse effect of unspecified drugs, medicaments and biological substances, initial encounter: Secondary | ICD-10-CM

## 2020-02-11 MED ORDER — METHYLPREDNISOLONE SODIUM SUCC 125 MG IJ SOLR
125.0000 mg | Freq: Once | INTRAMUSCULAR | Status: AC
  Administered 2020-02-11: 125 mg via INTRAMUSCULAR

## 2020-02-11 MED ORDER — HYDROXYZINE HCL 25 MG PO TABS: ORAL_TABLET | ORAL | 0 refills | Status: DC

## 2020-02-11 MED FILL — hydrOXYzine HCL 25 MG TABS: 25 | 3 days supply | Qty: 30 | Fill #0

## 2020-02-11 NOTE — Telephone Encounter (Signed)
Pt. Reports she took one dose of fluconazole Tuesday and Tuesday night started breaking out in a rash. Have hives today on legs,arms and trunk. Very itchy. Warm transfer to [facility] in the practice for an appointment.  Answer Assessment - Initial Assessment Questions 1. APPEARANCE of RASH: "Describe the rash." (e.g., spots, blisters, raised areas, skin peeling, scaly)     Hives 2. SIZE: "How big are the spots?" (e.g., tip of pen, eraser, coin; inches, centimeters)     Quarter size and smaller 3. LOCATION: "Where is the rash located?"     Trunk, legs,arms 4. COLOR: "What color is the rash?" (Note: It is difficult to assess rash color in people with darker-colored skin. When this situation occurs, simply ask the caller to describe what they see.)     Yes 5. ONSET: "When did the rash begin?"     Tuesday night 6. FEVER: "Do you have a fever?" If Yes, ask: "What is your temperature, how was it measured, and when did it start?"     No 7. ITCHING: "Does the rash itch?" If Yes, ask: "How bad is the itch?" (Scale 1-10; or mild, moderate, severe)     Moderate 8. CAUSE: "What do you think is causing the rash?"     Medication 9. NEW MEDICATION: "What new medication are you taking?" (e.g., name of antibiotic) "When did you start taking this medication?".     Diflucan 10. OTHER SYMPTOMS: "Do you have any other symptoms?" (e.g., sore throat, fever, joint pain)       No 11. PREGNANCY: "Is there any chance you are pregnant?" "When was your last menstrual period?"       No  Protocols used: RASH - WIDESPREAD ON DRUGS-A-AH

## 2020-02-11 NOTE — Progress Notes (Signed)
Established Patient Office Visit  Subjective:  Patient ID: Jenna [name], female    DOB: [birth year]  Age: 49 y.o. MRN: [id number]  CC: No chief complaint on file.   HPI Cyndel Griffey reports that she took a dose of fluconazole on Tuesday and later that evening started breaking out in a rash.  Describes it as very itchy, on legs, arms, and trunk.  Denies any new lotions, fragrances, detergents, body washes  Has tried Benadryl cream without any relief.  Past Medical History:  Diagnosis Date  . Arthritis   . Back pain   . Chest pain   . Depression   . Edema of both lower extremities   . High blood pressure   . Joint pain   . Osteoarthritis     Past Surgical History:  Procedure Laterality Date  . CHOLECYSTECTOMY    . TUBAL LIGATION      Family History  Problem Relation Age of Onset  . Diabetes Mother   . Hypertension Mother     Social History   Socioeconomic History  . Marital status: Single    Spouse name: Not on file  . Number of children: Not on file  . Years of education: Not on file  . Highest education level: Not on file  Occupational History  . Occupation: Stay at home  Tobacco Use  . Smoking status: Current Every Day Smoker    Packs/day: 0.10    Types: Cigarettes  . Smokeless tobacco: Never Used  Vaping Use  . Vaping Use: Never used  Substance and Sexual Activity  . Alcohol use: No    Alcohol/week: 0.0 standard drinks  . Drug use: No  . Sexual activity: Not on file  Other Topics Concern  . Not on file  Social History Narrative  . Not on file   Social Determinants of Health   Financial Resource Strain:   . Difficulty of Paying Living Expenses: Not on file  Food Insecurity:   . Worried About Programme researcher, broadcasting/film/video in the Last Year: Not on file  . Ran Out of Food in the Last Year: Not on file  Transportation Needs:   . Lack of Transportation (Medical): Not on file  . Lack of Transportation (Non-Medical): Not on file  Physical Activity:    . Days of Exercise per Week: Not on file  . Minutes of Exercise per Session: Not on file  Stress:   . Feeling of Stress : Not on file  Social Connections:   . Frequency of Communication with Friends and Family: Not on file  . Frequency of Social Gatherings with Friends and Family: Not on file  . Attends Religious Services: Not on file  . Active Member of Clubs or Organizations: Not on file  . Attends Banker Meetings: Not on file  . Marital Status: Not on file  Intimate Partner Violence:   . Fear of Current or Ex-Partner: Not on file  . Emotionally Abused: Not on file  . Physically Abused: Not on file  . Sexually Abused: Not on file    Outpatient Medications Prior to Visit  Medication Sig Dispense Refill  . DULoxetine (CYMBALTA) 60 MG capsule One tablet (60mg ) once per day for back pain and mood (Patient taking differently: Take 60 mg by mouth daily. ) 90 capsule 0  . ferrous sulfate 324 MG TBEC Take 324 mg by mouth every other day.     . methocarbamol (ROBAXIN) 500 MG tablet TAKE 1 TABLET (500 MG  TOTAL) BY MOUTH 3 (THREE) TIMES DAILY. AS NEEDED FOR MUSCLE SPASM 90 tablet 1  . naproxen sodium (ALEVE) 220 MG tablet Take 220-440 mg by mouth 2 (two) times daily as needed (Pain).    . cyclobenzaprine (FLEXERIL) 10 MG tablet Take 1 tablet (10 mg total) by mouth 3 (three) times daily as needed for muscle spasms. (Patient not taking: Reported on 02/11/2020) 10 tablet 0  . methylPREDNISolone (MEDROL) 4 MG tablet Medrol dose pack. Take as instructed 21 tablet 0  . Vitamin D, Ergocalciferol, (DRISDOL) 1.25 MG (50000 UNIT) CAPS capsule Take 1 capsule (50,000 Units total) by mouth every 7 (seven) days. (Patient not taking: Reported on 01/21/2020) 4 capsule 0  . metroNIDAZOLE (FLAGYL) 500 MG tablet Take 1 tablet (500 mg total) by mouth 2 (two) times daily. 14 tablet 0  . sulfamethoxazole-trimethoprim (BACTRIM DS) 800-160 MG tablet Take 1 tablet by mouth 2 (two) times daily. 10 tablet 0    No facility-administered medications prior to visit.    Allergies  Allergen Reactions  . Diflucan [Fluconazole] Hives    ROS Review of Systems  Constitutional: Negative.   HENT: Negative.  Negative for trouble swallowing.   Eyes: Negative.   Respiratory: Negative.  Negative for shortness of breath.   Cardiovascular: Negative.   Gastrointestinal: Negative.   Endocrine: Negative.   Genitourinary: Negative.   Musculoskeletal: Negative.   Allergic/Immunologic: Negative.   Neurological: Negative.   Hematological: Negative.   Psychiatric/Behavioral: Negative.       Objective:    Physical Exam Vitals and nursing note reviewed.  Constitutional:      General: She is not in acute distress.    Appearance: Normal appearance. She is not ill-appearing.  HENT:     Head: Normocephalic and atraumatic.     Right Ear: External ear normal.     Left Ear: External ear normal.     Nose: Nose normal.     Mouth/Throat:     Mouth: Mucous membranes are moist.     Pharynx: Oropharynx is clear. No pharyngeal swelling.  Eyes:     Extraocular Movements: Extraocular movements intact.     Conjunctiva/sclera: Conjunctivae normal.     Pupils: Pupils are equal, round, and reactive to light.  Cardiovascular:     Rate and Rhythm: Regular rhythm. Tachycardia present.  Pulmonary:     Effort: Pulmonary effort is normal.     Breath sounds: Normal breath sounds.  Abdominal:     General: Abdomen is flat.     Palpations: Abdomen is soft.  Musculoskeletal:        General: Normal range of motion.     Cervical back: Normal range of motion and neck supple.  Skin:    General: Skin is warm.     Findings: Rash present. Rash is urticarial.     Comments: Generalized trunk, both arms, both legs and back  Neurological:     General: No focal deficit present.     Mental Status: She is alert and oriented to person, place, and time.  Psychiatric:        Mood and Affect: Mood normal.        Thought Content:  Thought content normal.        Judgment: Judgment normal.     BP (!) 176/136 (BP Location: Right Arm, Patient Position: Sitting, Cuff Size: Large)   Pulse (!) 130   Temp 98.4 F (36.9 C) (Oral)   Resp 18   Ht 5\' 5"  (1.651 m)  Wt 288 lb (130.6 kg)   SpO2 100%   BMI 47.93 kg/m  Wt Readings from Last 3 Encounters:  02/11/20 288 lb (130.6 kg)  02/08/20 284 lb (128.8 kg)  01/21/20 289 lb (131.1 kg)     Health Maintenance Due  Topic Date Due  . Hepatitis C Screening  Never done  . COVID-19 Vaccine (1) Never done  . HIV Screening  Never done  . TETANUS/TDAP  Never done  . PAP SMEAR-Modifier  Never done  . INFLUENZA VACCINE  10/11/2019    There are no preventive care reminders to display for this patient.  Lab Results  Component Value Date   TSH 1.040 12/08/2018   Lab Results  Component Value Date   WBC 8.9 09/25/2019   HGB 11.9 (L) 09/25/2019   HCT 37.3 09/25/2019   MCV 91.9 09/25/2019   PLT 269 09/25/2019   Lab Results  Component Value Date   NA 140 09/25/2019   K 3.6 09/25/2019   CO2 26 09/25/2019   GLUCOSE 105 (H) 09/25/2019   BUN 8 09/25/2019   CREATININE 0.89 09/25/2019   BILITOT 0.3 07/28/2019   ALKPHOS 75 07/28/2019   AST 16 07/28/2019   ALT 21 07/28/2019   PROT 6.7 07/28/2019   ALBUMIN 4.3 07/28/2019   CALCIUM 8.9 09/25/2019   ANIONGAP 6 09/25/2019   Lab Results  Component Value Date   CHOL 143 12/08/2018   Lab Results  Component Value Date   HDL 47 12/08/2018   Lab Results  Component Value Date   LDLCALC 81 12/08/2018   Lab Results  Component Value Date   TRIG 79 12/08/2018   No results found for: CHOLHDL Lab Results  Component Value Date   HGBA1C 5.5 07/28/2019      Assessment & Plan:   Problem List Items Addressed This Visit    None    Visit Diagnoses    Drug eruption    -  Primary   Allergic reaction, initial encounter       Relevant Medications   methylPREDNISolone sodium succinate (SOLU-MEDROL) 125 mg/2 mL  injection 125 mg (Completed)   Urticaria due to drug allergy       Relevant Medications   hydrOXYzine (ATARAX/VISTARIL) 25 MG tablet    1. Drug eruption No difficulty breathing, not eating or drinking, patient given steroid injection in office, trial hydroxyzine 25 to 50 mg as needed for pruritus.  Red flags given for prompt evaluation in emergency department  2. Allergic reaction, initial encounter   3. Urticaria due to drug allergy  - hydrOXYzine (ATARAX/VISTARIL) 25 MG tablet; Take 1-2 tabs PO q6-8hrs PRN for itching  Dispense: 30 tablet; Refill: 0   I have reviewed the patient's medical history (PMH, PSH, Social History, Family History, Medications, and allergies) , and have been updated if relevant. I spent 20 minutes reviewing chart and  face to face time with patient.    Meds ordered this encounter  Medications  . hydrOXYzine (ATARAX/VISTARIL) 25 MG tablet    Sig: Take 1-2 tabs PO q6-8hrs PRN for itching    Dispense:  30 tablet    Refill:  0    Order Specific Question:   Supervising Provider    Answer:   Delford Field, PATRICK E [[id number]]  . methylPREDNISolone sodium succinate (SOLU-MEDROL) 125 mg/2 mL injection 125 mg    Follow-up: Return if symptoms worsen or fail to improve.    Kasandra Knudsen Mayers, PA-C

## 2020-02-11 NOTE — Patient Instructions (Signed)
You were given a shot of steroids today, this should help calm down the hives shortly.  I sent a prescription for hydroxyzine to the pharmacy, it is a 25 mg tablet, you can take 1-2 of these to help with the itching.  You can use this every 6-8 hours.  I hope that you feel better soon  Kennieth Rad, PA-C Physician Assistant [place] [email or web address]   Drug Allergy A drug allergy happens when the body's disease-fighting system (immune system) reacts badly to a medicine. Drug allergies range from mild to severe. Some allergic reactions occur one week or more after you are exposed to a medicine (delayed reaction). A sudden (acute), severe allergic reaction that affects multiple areas of the body is called an anaphylactic reaction (anaphylaxis). Anaphylaxis can be life-threatening. All allergic reactions to a medicine require medical evaluation, even if the allergic reaction appears to be mild. What are the causes? This condition is caused by the immune system wrongly identifying a medicine as being harmful. When this happens, the body releases proteins (antibodies) and other compounds, such as histamine, into the bloodstream. This causes swelling in certain tissues and reduces blood flow to important areas, such as the heart and lungs. Almost any medicine can cause an allergic reaction. Medicines that commonly cause allergic reactions (are common allergens) include:  Penicillin.  Sulfa medicines (sulfonamides).  Medicines that numb certain areas of the body (local anesthetics).  X-ray dyes that contain iodine. What are the signs or symptoms? Common symptoms of a mild allergic reaction include:  Nasal congestion.  Tingling in the mouth.  An itchy, red rash. Common symptoms of a severe allergic reaction include:  Swelling of the eyes, lips, face, or tongue.  Swelling of the back of the mouth and the  throat.  Wheezing.  A hoarse voice.  Itchy, red, swollen areas of skin (hives).  Dizziness or light-headedness.  Fainting.  Anxiety or confusion.  Abdominal pain.  Difficulty breathing, speaking, or swallowing.  Chest tightness.  Fast or irregular heartbeats (palpitations).  Vomiting.  Diarrhea. How is this diagnosed? This condition is diagnosed based on a physical exam and your history of recent exposure to one or more medicines. You may be referred for follow-up testing by a health care provider who specializes in allergies. This testing can confirm the diagnosis of a drug allergy and determine which medicines you are allergic to. Testing may include:  Skin tests. These may involve: ? Injecting a small amount of the possible allergen between layers of your skin (intradermal injection). ? Applying patches to your skin.  Blood tests.  Drug challenge. For this test, a health care provider gives you a small amount of a medicine in gradual doses while watching for an allergic reaction. If you are unsure of what caused your allergic reaction, your health care provider may ask you for:  Information about all medicines that you take on a regular basis.  The date and time of your reaction. How is this treated? There is no cure for allergies. However, an allergic reaction can be treated with:  Medicines that help: ? Reduce pain and swelling (NSAIDs). ? Relieve itching and hives (antihistamines). ? Reduce swelling (corticosteroids).  Respiratory inhalers. These are inhaled medicines that help open (dilate) the airways in your lungs.  Injections of medicine that helps to relax the muscles in your airways and tighten your blood vessels (epinephrine). Severe allergic reactions, such as anaphylaxis, require immediate treatment in a hospital. You may need to  be hospitalized for observation. You may also be prescribed rescue medicines, such as epinephrine. Epinephrine comes in  many forms, including what is commonly called an auto-injector "pen" (pre-filled automatic epinephrine injection device). Follow these instructions at home: If you have a severe allergy   Always keep an auto-injector pen or your anaphylaxis kit near you. These can be lifesaving if you have a severe reaction. Use your auto-injector pen or anaphylaxis kit as told by your health care provider.  Make sure that you, the members of your household, and your employer know: ? How to use an anaphylaxis kit. ? How to use an auto-injector pen to give you an epinephrine injection.  Replace your epinephrine immediately after you use your auto-injector pen, in case you have another reaction.  Wear a medical alert bracelet or necklace that states your drug allergy, if told by your health care provider. General instructions  Avoidmedicines that you are allergic to.  Take over-the-counter and prescription medicines only as told by your health care provider.  If you were given medicines to treat your reaction, do not drive until your health care provider approves.  If you have hives or a rash: ? Use an over-the-counter antihistamineas told by your health care provider. ? Apply cold, wet cloths (cold compresses) to your skin or take baths or showers in cool water. Avoid hot water.  If you had tests done, it is up to you to get your test results. Ask your health care provider when your results will be ready.  Tell any health care providers who care for you that you have a drug allergy.  Keep all follow-up visits as told by your health care provider. This is important. Contact a health care provider if:  You think that you are having a mild allergic reaction. Symptoms of an allergic reaction usually start within 30 minutes after you are exposed to a medicine.  You have symptoms that last more than 2 days after your reaction.  Your symptoms get worse.  You develop new symptoms. Get help right away  if:  You needed to use epinephrine. ? An epinephrine injection helps to manage life-threatening allergic reactions, but you still need to go to the emergency room even if epinephrine seems to work. This is important because anaphylaxis may happen again within 72 hours (rebound anaphylaxis). ? If you used epinephrine to treat anaphylaxis outside of the hospital, you need additional medical care. This may include more doses of epinephrine.  You develop symptoms of a severe allergic reaction. These symptoms may represent a serious problem that is an emergency. Do not wait to see if the symptoms will go away. Use your auto-injector pen or anaphylaxis kit as you have been instructed, and get medical help right away. Call your local emergency services (911 in the [place] Do not drive yourself to the hospital. Summary  A drug allergy happens when the body's disease-fighting system reacts badly to a medicine.  Drug allergies range from mild to severe. In some cases, an allergic reaction may be life-threatening.  If you have a severe allergy, always keep an auto-injector pen or your anaphylaxis kit near you. This information is not intended to replace advice given to you by your health care provider. Make sure you discuss any questions you have with your health care provider. Document Revised: 09/11/2017 Document Reviewed: 09/11/2017 Elsevier Patient Education  [place].

## 2020-02-11 NOTE — Progress Notes (Signed)
Patient has not eaten today and patient has not taken medication today. Patient complains of a rash beginning Tuesday night and whelped up all over this morning! Patient has used alcohol, Vaseline, benadryl and now has a burning sensation from scratching. No new exposure. Patient took medication for yeast on Tuesday - which she has never used prior- and reaction occurred.

## 2020-02-22 ENCOUNTER — Ambulatory Visit: Payer: Self-pay

## 2020-05-09 ENCOUNTER — Ambulatory Visit (INDEPENDENT_AMBULATORY_CARE_PROVIDER_SITE_OTHER): Payer: Self-pay | Admitting: Orthopaedic Surgery

## 2020-05-09 ENCOUNTER — Encounter: Payer: Self-pay | Admitting: Orthopaedic Surgery

## 2020-05-09 VITALS — Ht 65.0 in | Wt 291.0 lb

## 2020-05-09 DIAGNOSIS — M1712 Unilateral primary osteoarthritis, left knee: Secondary | ICD-10-CM

## 2020-05-09 NOTE — Progress Notes (Signed)
The patient is a 50 year old female is here today for a weight and BMI calculation.  Her BMI has always been over 40.  She is in need of knee replacement surgery at some point given the severe arthritis she has in her left knee.  She has had no other acute change in her medical status.  Today she is actually weighs more than she did the last time.  Her weight is 291 with a BMI of 48.42.  I talked to her and let her know that there is not any way that we can get her on the operating room schedule for knee replacement surgery because he would not be a safe given her obesity.  We can certainly see her back in 3 months for repeat weight and BMI calculation.

## 2020-08-11 ENCOUNTER — Ambulatory Visit (INDEPENDENT_AMBULATORY_CARE_PROVIDER_SITE_OTHER): Payer: Self-pay | Admitting: Orthopaedic Surgery

## 2020-08-11 ENCOUNTER — Encounter: Payer: Self-pay | Admitting: Orthopaedic Surgery

## 2020-08-11 ENCOUNTER — Other Ambulatory Visit: Payer: Self-pay

## 2020-08-11 VITALS — Ht 65.0 in | Wt 300.0 lb

## 2020-08-11 DIAGNOSIS — M1712 Unilateral primary osteoarthritis, left knee: Secondary | ICD-10-CM

## 2020-08-11 NOTE — Progress Notes (Signed)
The patient comes in today for continued follow-up as it relates to her the severe arthritis she has in her left knee.  Her obesity is been the limiting factor on [place] being able to proceed for a total knee arthroplasty.  Her last BMI with [place] was 48.42 with a weight of 291.  Today her weight is 300 with a BMI of 49.92.  She has been to the weight loss clinic before and she says she is working on diet but I am at a loss of what else that I can do for her given her weight gain.  Her left knee is still certainly painful throughout its arc of motion and has signs of arthritis.  Unfortunate she is at a weight that we cannot proceed with any type of surgery safely.  I [facility] that [facility] at [facility] so [facility] can help determine why she is now put weight on in spite of what she tells me about her diet.  She is already on anti-inflammatories and other medications for pain.  At this point I will see her back in 6 months with a repeat weight and BMI calculation.  At this point also steroid injections have not helped her at all so I am at a loss what else that I can recommend.

## 2020-08-24 ENCOUNTER — Telehealth: Payer: Self-pay

## 2020-08-24 ENCOUNTER — Other Ambulatory Visit: Payer: Self-pay

## 2020-08-24 ENCOUNTER — Ambulatory Visit: Payer: Self-pay | Attending: Family Medicine

## 2020-09-15 ENCOUNTER — Ambulatory Visit: Payer: Self-pay | Admitting: Physician Assistant

## 2020-09-17 ENCOUNTER — Emergency Department (HOSPITAL_BASED_OUTPATIENT_CLINIC_OR_DEPARTMENT_OTHER)
Admission: EM | Admit: 2020-09-17 | Discharge: 2020-09-17 | Disposition: A | Discharge status: Home / Self Care | Payer: Self-pay | Attending: Emergency Medicine | Admitting: Emergency Medicine

## 2020-09-17 ENCOUNTER — Emergency Department (HOSPITAL_BASED_OUTPATIENT_CLINIC_OR_DEPARTMENT_OTHER): Payer: Self-pay

## 2020-09-17 ENCOUNTER — Other Ambulatory Visit: Payer: Self-pay

## 2020-09-17 ENCOUNTER — Encounter (HOSPITAL_BASED_OUTPATIENT_CLINIC_OR_DEPARTMENT_OTHER): Payer: Self-pay

## 2020-09-17 DIAGNOSIS — F17210 Nicotine dependence, cigarettes, uncomplicated: Secondary | ICD-10-CM | POA: Insufficient documentation

## 2020-09-17 DIAGNOSIS — I10 Essential (primary) hypertension: Secondary | ICD-10-CM | POA: Insufficient documentation

## 2020-09-17 DIAGNOSIS — M79644 Pain in right finger(s): Secondary | ICD-10-CM | POA: Insufficient documentation

## 2020-09-17 DIAGNOSIS — M79641 Pain in right hand: Secondary | ICD-10-CM

## 2020-09-17 IMAGING — CR DG HAND COMPLETE 3+V*R*
3 series · 3 of 3 positions shown · non-contrast
Comparison: None.

CLINICAL DATA: Onset right thumb pain and popping 1 month ago. No
known injury.

EXAM:
RIGHT HAND - COMPLETE 3+ VIEW

[x hand pa right]
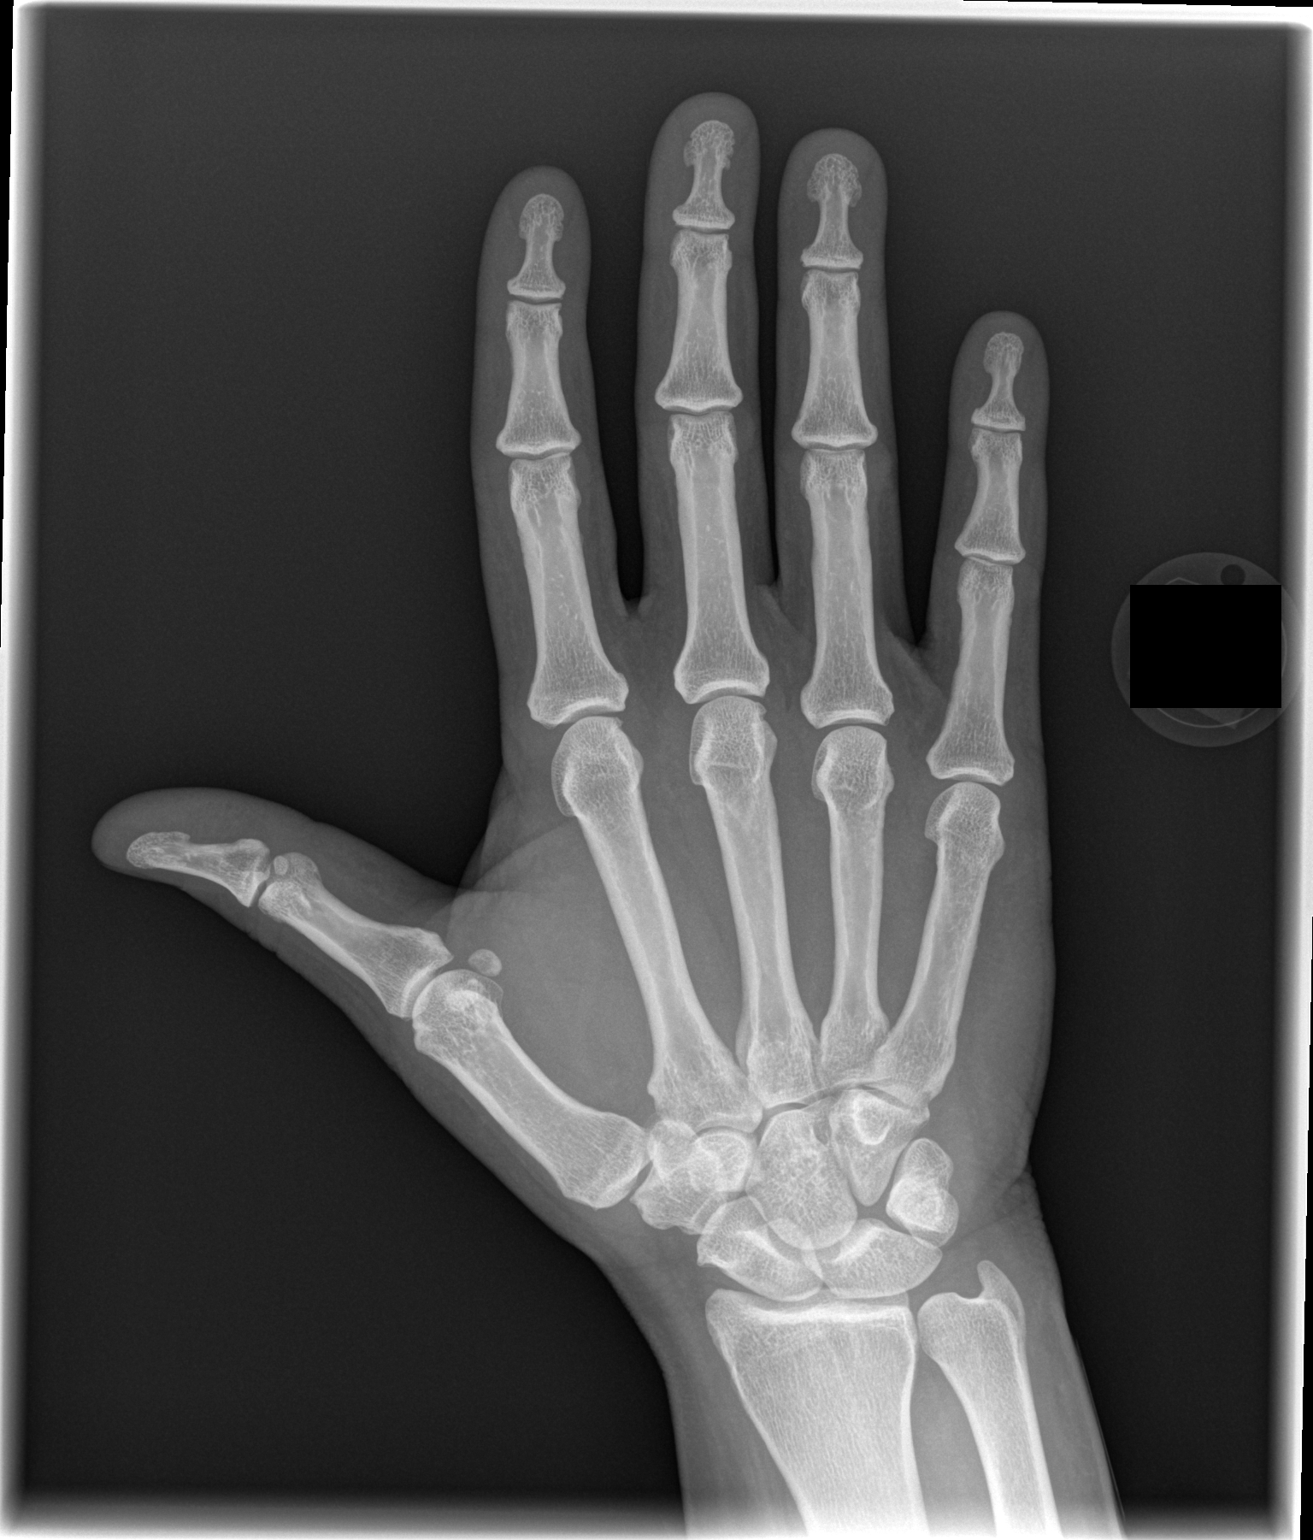

[x hand oblique right]
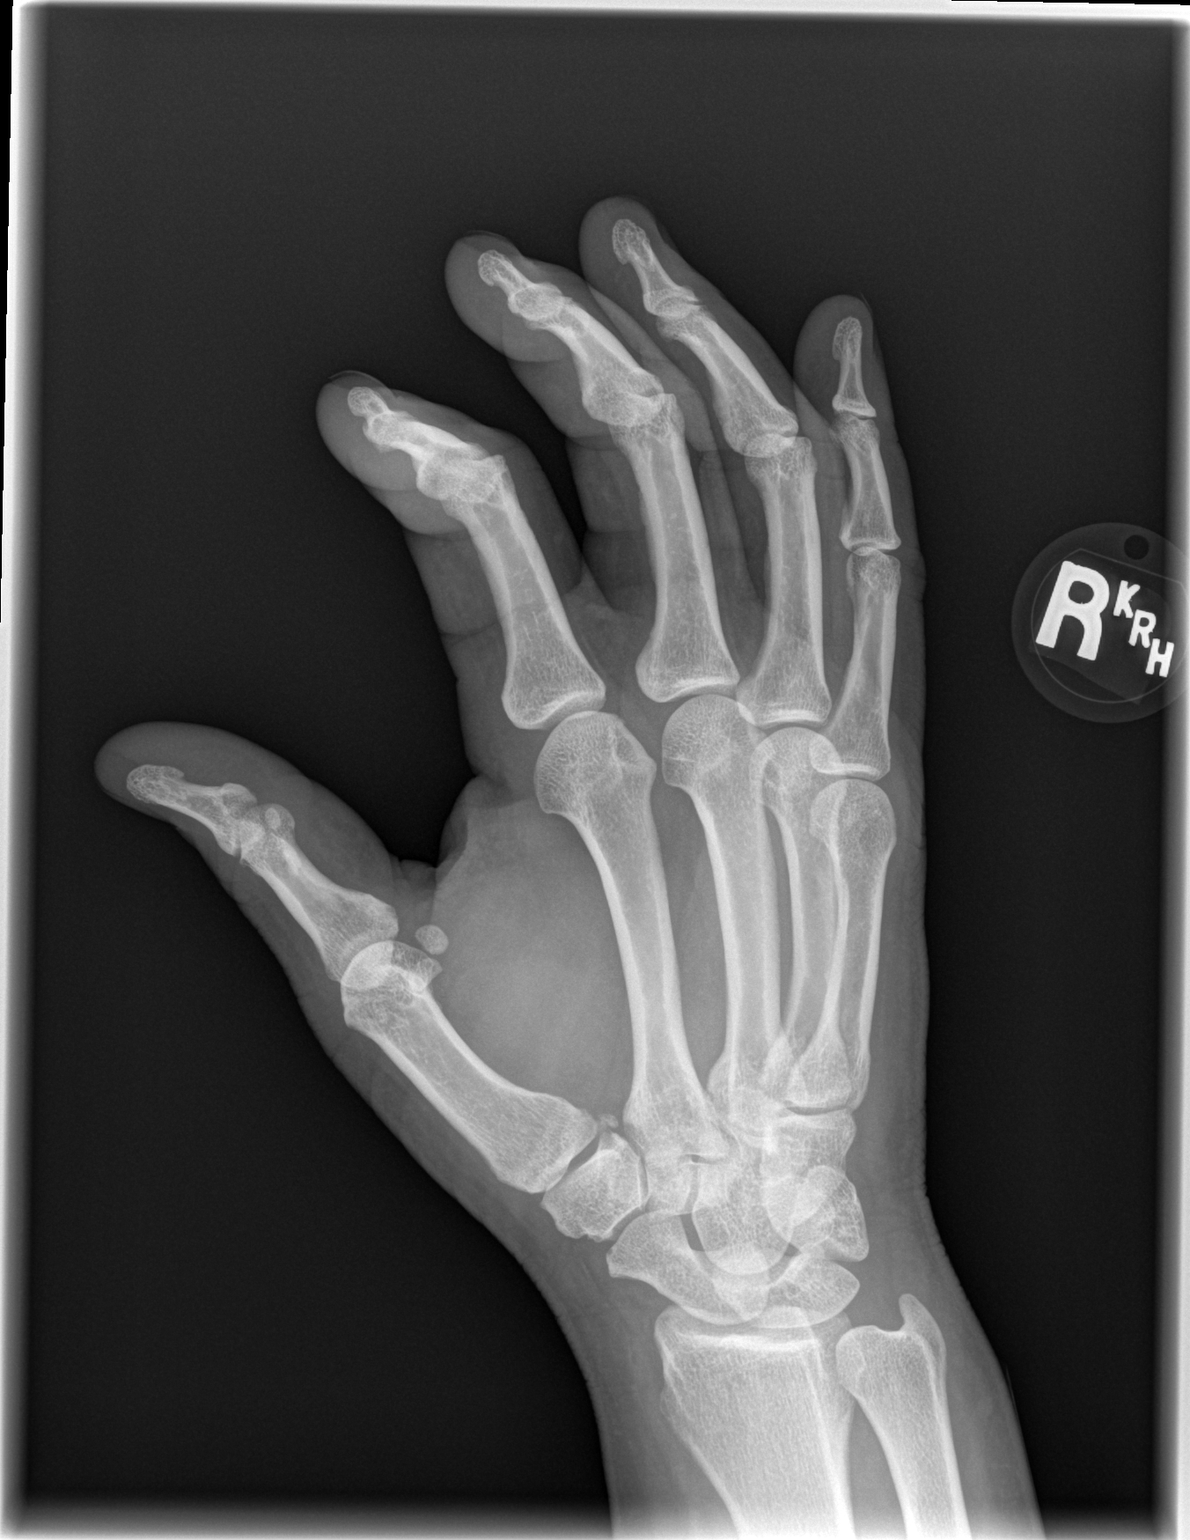

[x hand lat right]
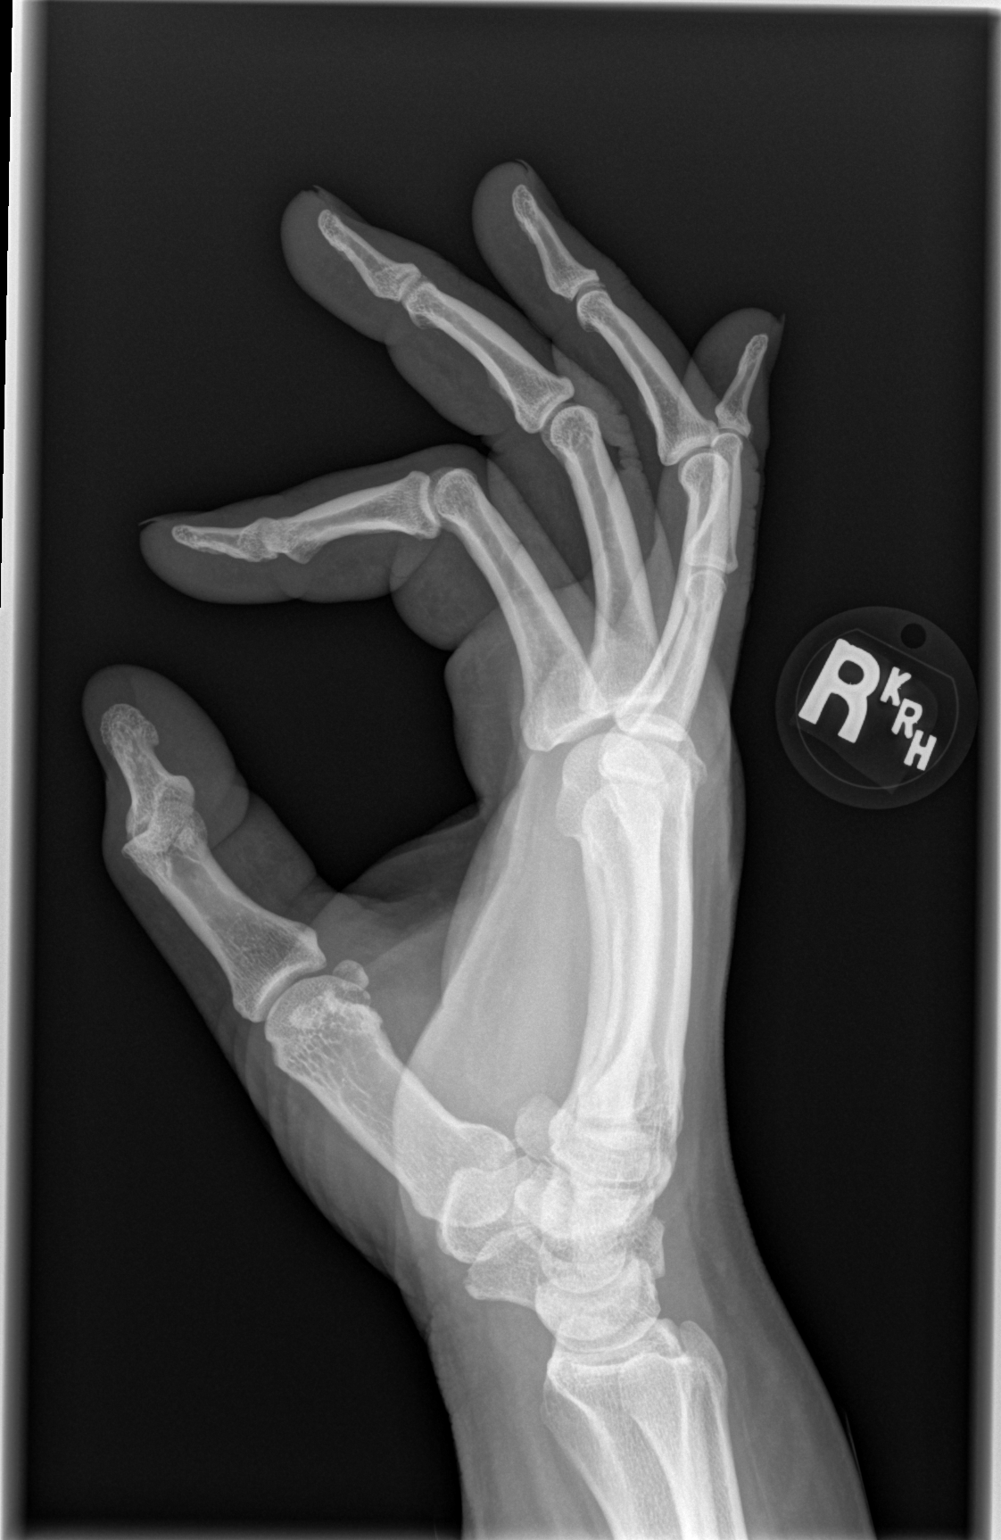

[3 of 3 positions shown; findings below may reference images not displayed]

FINDINGS: There is no acute bony or joint abnormality. Joint spaces and
alignment are maintained. Mineralization is normal. No erosion,
osteophytosis or periostitis. Soft tissues are negative. No
chondrocalcinosis.
IMPRESSION: Negative exam.

## 2020-09-17 NOTE — ED Provider Notes (Signed)
[facility] EMERGENCY DEPARTMENT Provider Note   CSN: [id number] Arrival date & time: 09/17/20  1429     History Chief Complaint  Patient presents with   Thumb Pain    Jenna [name] is a 50 y.o. female.  HPI Patient is a 50 year old female with past medical history detailed below presented today with thumb discomfort for the past month.  She states that she has not had any trauma to her hand.  She states that when she flexes the MCP she notices that she has a popping sensation in the thenar eminence of her hand.  She states that it is achy and uncomfortable it pops.  She denies any bruising or bleeding.  Denies any lacerations or abrasions.  No trauma  Denies any numbness or weakness in her hand.  No other associated symptoms or aggravating or mitigating factors apart from those described above.  She is taken no medications for her symptoms.     Past Medical History:  Diagnosis Date   Arthritis    Back pain    Chest pain    Depression    Edema of both lower extremities    High blood pressure    Joint pain    Osteoarthritis     Patient Active Problem List   Diagnosis Date Noted   Severe obesity (BMI >= 40) (HCC) 02/08/2020   Insulin resistance 07/28/2019   Depression 07/28/2019   Vitamin D deficiency 12/09/2018   Class 3 severe obesity with serious comorbidity and body mass index (BMI) of 40.0 to 44.9 in adult (HCC) 01/17/2017   Low back pain 05/17/2016   Unilateral primary osteoarthritis, left knee 05/08/2016   Primary osteoarthritis of both knees 06/27/2015   Closed right fibular fracture 08/10/2014    Past Surgical History:  Procedure Laterality Date   CHOLECYSTECTOMY     TUBAL LIGATION       OB History   No obstetric history on file.     Family History  Problem Relation Age of Onset   Diabetes Mother    Hypertension Mother     Social History   Tobacco Use   Smoking status: Every Day    Packs/day: 0.10    Pack years: 0.00    Types:  Cigarettes   Smokeless tobacco: Never  Vaping Use   Vaping Use: Never used  Substance Use Topics   Alcohol use: No    Alcohol/week: 0.0 standard drinks   Drug use: No    Home Medications Prior to Admission medications   Medication Sig Start Date End Date Taking? Authorizing Provider  cyclobenzaprine (FLEXERIL) 10 MG tablet Take 1 tablet (10 mg total) by mouth 3 (three) times daily as needed for muscle spasms. Patient not taking: Reported on 02/11/2020 09/26/19   Roxy Horseman, PA-C  DULoxetine (CYMBALTA) 60 MG capsule One tablet (60mg ) once per day for back pain and mood Patient taking differently: Take 60 mg by mouth daily.  08/24/19   08/26/19, NP  ferrous sulfate 324 MG TBEC Take 324 mg by mouth every other day.     [provider]  fluconazole (DIFLUCAN) 150 MG tablet TAKE 1 TABLET (150 MG TOTAL) BY MOUTH ONCE FOR 1 DOSE. AFTER YOU COMPLETE THE ANTIBIOTICS 01/27/20 01/26/21  01/28/21, PA-C  hydrOXYzine (ATARAX/VISTARIL) 25 MG tablet TAKE 1 TO 2 TABLETS BY MOUTH EVERY 6 TO 8 HOURS AS NEEDED FOR ITCHING 02/11/20 02/10/21  Mayers, Cari S, PA-C  methocarbamol (ROBAXIN) 500 MG tablet TAKE 1  TABLET (500 MG TOTAL) BY MOUTH 3 (THREE) TIMES DAILY. AS NEEDED FOR MUSCLE SPASM 01/29/20   Fulp, Cammie, MD  methylPREDNISolone (MEDROL) 4 MG tablet Medrol dose pack. Take as instructed 09/28/19   Kathryne Hitch, MD  naproxen sodium (ALEVE) 220 MG tablet Take 220-440 mg by mouth 2 (two) times daily as needed (Pain).    [provider]  Vitamin D, Ergocalciferol, (DRISDOL) 1.25 MG (50000 UNIT) CAPS capsule Take 1 capsule (50,000 Units total) by mouth every 7 (seven) days. Patient not taking: Reported on 01/21/2020 06/18/19   Corinna Capra A, DO    Allergies    Diflucan [fluconazole]  Review of Systems   Review of Systems  Constitutional:  Negative for fever.  HENT:  Negative for congestion.   Respiratory:  Negative for shortness of breath.   Cardiovascular:   Negative for chest pain.  Gastrointestinal:  Negative for abdominal distention.  Musculoskeletal:        Right thumb pain/hand pain  Neurological:  Negative for dizziness and headaches.   Physical Exam Updated Vital Signs BP 135/80 (BP Location: Left Arm)   Pulse 79   Temp 98.4 F (36.9 C) (Oral)   Resp 18   Ht 5\' 5"  (1.651 m)   Wt 131.5 kg   LMP 09/07/2020   SpO2 100%   BMI 48.26 kg/m   Physical Exam Vitals and nursing note reviewed.  Constitutional:      General: She is not in acute distress.    Appearance: Normal appearance. She is not ill-appearing.  HENT:     Head: Normocephalic and atraumatic.  Eyes:     General: No scleral icterus.       Right eye: No discharge.        Left eye: No discharge.     Conjunctiva/sclera: Conjunctivae normal.  Pulmonary:     Effort: Pulmonary effort is normal.     Breath sounds: No stridor.  Musculoskeletal:     Comments: Some mild tenderness palpation of the thenar eminence of the right hand.  There is full range of motion at the MCP and DIP of the right thumb good cap refill.  Sensation intact.  Notably there is some popping when patient flexes/adducts her right thumb.  Skin:    General: Skin is warm and dry.  Neurological:     Mental Status: She is alert and oriented to person, place, and time. Mental status is at baseline.    ED Results / Procedures / Treatments   Labs (all labs ordered are listed, but only abnormal results are displayed) Labs Reviewed - No data to display  EKG None  Radiology No results found.  Procedures Procedures   Medications Ordered in ED Medications - No data to display  ED Course  I have reviewed the triage vital signs and the nursing notes.  Pertinent labs & imaging results that were available during my care of the patient were reviewed by me and considered in my medical decision making (see chart for details).  Patient is a 50 year old female with past medical history detailed in HPI  presented for right thumb pain for 1 month.  It is atraumatic.  Her x-ray is without abnormality.  I suspect trigger finger given the popping she is having.  [facility]erred to [facility].  Patient states that she has orthopedic doctor she would prefer to follow-up with. I recommend Tylenol ibuprofen cool compresses.  She is agreeable to plan.    MDM Rules/Calculators/A&P  CLINICAL DATA: Onset right thumb pain and popping 1 month ago. No known injury.  EXAM: RIGHT HAND - COMPLETE 3+ VIEW  COMPARISON: None.  FINDINGS: There is no acute bony or joint abnormality. Joint spaces and alignment are maintained. Mineralization is normal. No erosion, osteophytosis or periostitis. Soft tissues are negative. No chondrocalcinosis.  IMPRESSION: Negative exam.   Electronically Signed By: Drusilla Kanner M.D. On: 09/17/2020 14:58   Final Clinical Impression(s) / ED Diagnoses Final diagnoses:  Pain of right hand    Rx / DC Orders ED Discharge Orders     None        Gailen Shelter, [place] 09/17/20 1619    Tegeler, Canary Brim, MD 09/17/20 1650

## 2020-09-17 NOTE — Discharge Instructions (Addendum)
I strongly suspect that you have a trigger finger.  Please follow-up with your orthopedic doctor today and evaluate for this possibility.  Use Tylenol and ibuprofen for pain as discussed below.  Please use Tylenol or ibuprofen for pain.  You may use 600 mg ibuprofen every 6 hours or 1000 mg of Tylenol every 6 hours.  You may choose to alternate between the 2.  This would be most effective.  Not to exceed 4 g of Tylenol within 24 hours.  Not to exceed 3200 mg ibuprofen 24 hours.

## 2020-09-17 NOTE — ED Triage Notes (Signed)
Pt c/o right thumb pain that goes into hand x 1 month. Denies known injury. States when she bends finger it feels like it "gets stuck"

## 2020-09-22 ENCOUNTER — Encounter: Payer: Self-pay | Admitting: Physician Assistant

## 2020-09-22 ENCOUNTER — Ambulatory Visit (INDEPENDENT_AMBULATORY_CARE_PROVIDER_SITE_OTHER): Payer: Self-pay | Admitting: Physician Assistant

## 2020-09-22 DIAGNOSIS — M65311 Trigger thumb, right thumb: Secondary | ICD-10-CM

## 2020-09-22 MED ORDER — LIDOCAINE HCL 1 % IJ SOLN
0.5000 mL | INTRAMUSCULAR | Status: AC | PRN
  Administered 2020-09-22: .5 mL

## 2020-09-22 MED ORDER — METHYLPREDNISOLONE ACETATE 40 MG/ML IJ SUSP
20.0000 mg | INTRAMUSCULAR | Status: AC | PRN
Start: 2020-09-22 — End: 2020-09-22
  Administered 2020-09-22: 20 mg

## 2020-09-22 NOTE — Progress Notes (Signed)
   Procedure Note  Patient: Jenna [name]             Date of Birth: 1971/03/03           MRN: [id number]             Visit Date: 09/22/2020 HPI: Jenna [name] well-known [employer] comes in with new complaint of right hand pain.  She was seen in the ER on 09/17/2020 due to the pain in her hand.  She states pains been ongoing for the past month.  No known injury.  She states she is having popping of the thumb.  Notes swelling.  She tries to keep the finger straight.  She is tried naproxen with no real relief.  She did try homemade splint to stabilize it.  She is right-hand dominant.  She is nondiabetic. 3 views right hand are reviewed from 09/17/2020.  These showed no acute fractures no bony abnormalities.  No subluxations dislocations particularly involving the right thumb.  ROS: See HPI  Physical exam General well-developed well-nourished female no acute distress mood affect appropriate. Psych: Alert and oriented x3 Right hand: No rashes skin lesions ulcerations.  Right slight edema no erythema.  Tenderness over the A1 pulley.  Active triggering of the right thumb.  No other triggering throughout the wrist and hand.  Nontender at the [facility] joint.  Procedures: Visit Diagnoses:  1. Trigger thumb, right thumb     Hand/UE Inj: R thumb A1 for trigger finger on 09/22/2020 5:16 PM Medications: 0.5 mL lidocaine 1 %; 20 mg methylPREDNISolone acetate 40 MG/ML Consent was given by the patient. Immediately prior to procedure a time out was called to verify the correct patient, procedure, equipment, support staff and site/side marked as required. Patient was prepped and draped in the usual sterile fashion.    Plan: Patient tolerated injection well.  Did discuss with her conservative treatment versus injection versus surgery.  She would like to try the conservative treatment today.  She will follow-up with [place] if triggering and pain can continue or become worse.  She will also begin applying Voltaren gel 2 g 4  times daily to the A1 pulley region which is reviewed with her.

## 2020-10-06 ENCOUNTER — Other Ambulatory Visit: Payer: Self-pay

## 2020-10-06 ENCOUNTER — Encounter: Payer: Self-pay | Admitting: Physician Assistant

## 2020-10-06 ENCOUNTER — Ambulatory Visit: Payer: Self-pay | Attending: Physician Assistant | Admitting: Physician Assistant

## 2020-10-06 VITALS — BP 124/81 | HR 99 | Ht 65.0 in | Wt 305.0 lb

## 2020-10-06 DIAGNOSIS — R519 Headache, unspecified: Secondary | ICD-10-CM

## 2020-10-06 DIAGNOSIS — F320 Major depressive disorder, single episode, mild: Secondary | ICD-10-CM

## 2020-10-06 DIAGNOSIS — Z131 Encounter for screening for diabetes mellitus: Secondary | ICD-10-CM

## 2020-10-06 DIAGNOSIS — N951 Menopausal and female climacteric states: Secondary | ICD-10-CM

## 2020-10-06 DIAGNOSIS — R635 Abnormal weight gain: Secondary | ICD-10-CM

## 2020-10-06 DIAGNOSIS — E559 Vitamin D deficiency, unspecified: Secondary | ICD-10-CM

## 2020-10-06 DIAGNOSIS — F32A Depression, unspecified: Secondary | ICD-10-CM

## 2020-10-06 DIAGNOSIS — M545 Low back pain, unspecified: Secondary | ICD-10-CM

## 2020-10-06 DIAGNOSIS — N921 Excessive and frequent menstruation with irregular cycle: Secondary | ICD-10-CM

## 2020-10-06 DIAGNOSIS — Z09 Encounter for follow-up examination after completed treatment for conditions other than malignant neoplasm: Secondary | ICD-10-CM

## 2020-10-06 MED ORDER — METHOCARBAMOL 500 MG PO TABS
500.0000 mg | ORAL_TABLET | Freq: Three times a day (TID) | ORAL | 1 refills | Status: DC
  Filled 2020-10-06: qty 90, 30d supply, fill #0
  Filled 2021-01-24: qty 90, 30d supply, fill #1

## 2020-10-06 MED ORDER — DULOXETINE HCL 60 MG PO CPEP
ORAL_CAPSULE | ORAL | 0 refills | Status: DC
  Filled 2020-10-06: qty 30, 30d supply, fill #0
  Filled 2021-01-24: qty 30, 30d supply, fill #1
  Filled 2021-02-28: qty 30, 30d supply, fill #2
  Filled 2021-08-21 – 2021-08-22 (×3): qty 30, 30d supply, fill #0

## 2020-10-06 MED ORDER — FLUTICASONE PROPIONATE 50 MCG/ACT NA SUSP
2.0000 | Freq: Every day | NASAL | 6 refills | Status: DC
  Filled 2020-10-06: qty 16, 30d supply, fill #0
  Filled 2021-02-28: qty 16, 30d supply, fill #1

## 2020-10-06 MED ORDER — NAPROXEN 500 MG PO TABS
500.0000 mg | ORAL_TABLET | Freq: Two times a day (BID) | ORAL | 1 refills | Status: DC
  Filled 2020-10-06: qty 60, 30d supply, fill #0
  Filled 2021-02-28: qty 60, 30d supply, fill #1
  Filled 2021-07-12: qty 60, 30d supply, fill #0

## 2020-10-06 NOTE — Progress Notes (Signed)
Patient ID: Jenna [name], female   DOB: 1970-05-23, 50 y.o.   MRN: [id number]   Jenna [name], is a 50 y.o. female  TFT:[id number]  HCW:[id number]  DOB - 1970-09-09  Subjective:  Chief Complaint and HPI: Jenna [name] is a 50 y.o. female here today to establish care and for a follow up visit-seen at ED 09/17/2020 for trigger thumb and has already seen ortho in f/up and received a steroid injection.  Also sees them for knee and aback problems.  Several other issues of concern today.  HA for 1.5 weeks; wakes up with them daily.  No thunderclapp.  Resolves with OTC.  She is having some congestion.  She is due to see her eye doctor too.  Also concerned bc of difficulty losing weight.  She loses then gains it right back.    Periods becoming a little irregular and having more clots when she has a period.  Some night sweats/hot flashes but not every day.    ED/Hospital notes reviewed.   Social History: Family history:  ROS:   Constitutional:  No f/c, No night sweats, No unexplained weight loss. EENT:  No vision changes, No blurry vision, No hearing changes. No mouth, throat, or ear problems. +congestion/no discolored mucus Respiratory: No cough, No SOB Cardiac: No CP, no palpitations GI:  No abd pain, No N/V/D. GU: No Urinary s/sx Musculoskeletal: sees ortho Neuro: + headache, no dizziness, no motor weakness.  Skin: No rash Endocrine:  No polydipsia. No polyuria.  Psych: Denies SI/HI  No problems updated.  ALLERGIES: Allergies  Allergen Reactions   Diflucan [Fluconazole] Hives    PAST MEDICAL HISTORY: Past Medical History:  Diagnosis Date   Arthritis    Back pain    Chest pain    Depression    Edema of both lower extremities    High blood pressure    Joint pain    Osteoarthritis     MEDICATIONS AT HOME: Prior to Admission medications   Medication Sig Start Date End Date Taking? Authorizing Provider  cyclobenzaprine (FLEXERIL) 10 MG tablet Take 1 tablet (10 mg  total) by mouth 3 (three) times daily as needed for muscle spasms. 09/26/19  Yes Roxy Horseman, PA-C  ferrous sulfate 324 MG TBEC Take 324 mg by mouth every other day.    Yes [provider]  methylPREDNISolone (MEDROL) 4 MG tablet Medrol dose pack. Take as instructed 09/28/19  Yes Kathryne Hitch, MD  naproxen (NAPROSYN) 500 MG tablet Take 1 tablet (500 mg total) by mouth 2 (two) times daily with a meal. Prn pain 10/06/20  Yes Anessia Oakland M, PA-C  Vitamin D, Ergocalciferol, (DRISDOL) 1.25 MG (50000 UNIT) CAPS capsule Take 1 capsule (50,000 Units total) by mouth every 7 (seven) days. 06/18/19  Yes Corinna Capra A, DO  DULoxetine (CYMBALTA) 60 MG capsule One tablet (60mg ) once per day for back pain and mood 10/06/20   10/08/20 M, PA-C  fluconazole (DIFLUCAN) 150 MG tablet TAKE 1 TABLET (150 MG TOTAL) BY MOUTH ONCE FOR 1 DOSE. AFTER YOU COMPLETE THE ANTIBIOTICS Patient not taking: Reported on 10/06/2020 01/27/20 01/26/21  01/28/21, PA-C  hydrOXYzine (ATARAX/VISTARIL) 25 MG tablet TAKE 1 TO 2 TABLETS BY MOUTH EVERY 6 TO 8 HOURS AS NEEDED FOR ITCHING Patient not taking: Reported on 10/06/2020 02/11/20 02/10/21  Mayers, 14/2/22, PA-C  methocarbamol (ROBAXIN) 500 MG tablet Take 1 tablet (500 mg total) by mouth 3 (three) times daily. As needed for muscle spasm 10/06/20   McKenzie,  Marzella Schlein, PA-C     Objective:  EXAM:   Vitals:   10/06/20 1442  BP: 124/81  Pulse: 99  SpO2: 99%  Weight: (!) 305 lb (138.3 kg)  Height: 5\' 5"  (1.651 m)    General appearance : A&OX3. NAD. Non-toxic-appearing;  morbidly obese HEENT: Atraumatic and Normocephalic.  PERRLA. EOM intact.  Nasal turbinates enlarged and bluish Neck: supple, no JVD. No cervical lymphadenopathy. No thyromegaly Chest/Lungs:  Breathing-non-labored, Good air entry bilaterally, breath sounds normal without rales, rhonchi, or wheezing  CVS: S1 S2 regular, no murmurs, gallops, rubs  Extremities: Bilateral Lower Ext shows  no edema, both legs are warm to touch with = pulse throughout Neurology:  CN II-XII grossly intact, Non focal.   Psych:  TP linear. J/I WNL. Normal speech. Appropriate eye contact and affect.  Skin:  No Rash  Data Review Lab Results  Component Value Date   HGBA1C 5.5 07/28/2019   HGBA1C 5.4 12/08/2018   HGBA1C 5.6 12/03/2017     Assessment & Plan   1. Vitamin D deficiency - Vitamin D, 25-hydroxy  2. Weight gain Proper diet, active lifestyle recommended - Comprehensive metabolic panel - Thyroid Panel With TSH - Lipid panel  3. Frequent headaches Likely sinus related-start flonase and tylenol as needed  4. Perimenopause - Comprehensive metabolic panel - CBC with Differential/Platelet - Thyroid Panel With TSH - FSH/LH  5. Menorrhagia with irregular cycle - Comprehensive metabolic panel - CBC with Differential/Platelet - Thyroid Panel With TSH  6. Screening for diabetes mellitus I have had a lengthy discussion and provided education about insulin resistance and the intake of too much sugar/refined carbohydrates.  I have advised the patient to work at a goal of eliminating sugary drinks, candy, desserts, sweets, refined sugars, processed foods, and white carbohydrates.  The patient expresses understanding.  - Hemoglobin A1c  7. Low back pain with radiation No changes - methocarbamol (ROBAXIN) 500 MG tablet; Take 1 tablet (500 mg total) by mouth 3 (three) times daily. As needed for muscle spasm  Dispense: 90 tablet; Refill: 1 - DULoxetine (CYMBALTA) 60 MG capsule; One tablet (60mg ) once per day for back pain and mood  Dispense: 90 capsule; Refill: 0 - naproxen (NAPROSYN) 500 MG tablet; Take 1 tablet (500 mg total) by mouth 2 (two) times daily with a meal. Prn pain  Dispense: 60 tablet; Refill: 1  8. Mild episode of depression (HCC) stable - DULoxetine (CYMBALTA) 60 MG capsule; One tablet (60mg ) once per day for back pain and mood  Dispense: 90 capsule; Refill: 0  9.  Encounter for examination following treatment at hospital     Patient have been counseled extensively about nutrition and exercise  Return for see PCP in 4-6 months.  The patient was given clear instructions to go to ER or return to medical center if symptoms don't improve, worsen or new problems develop. The patient verbalized understanding. The patient was told to call to get lab results if they haven't heard anything in the next week.     12/05/2017, PA-C [facility], [place] [facility]   10/06/2020, 2:56 PM

## 2020-10-07 LAB — CBC WITH DIFFERENTIAL/PLATELET
Basophils Absolute: 0 10*3/uL (ref 0.0–0.2)
Basos: 0 %
EOS (ABSOLUTE): 0.3 10*3/uL (ref 0.0–0.4)
Eos: 4 %
Hematocrit: 37.6 % (ref 34.0–46.6)
Hemoglobin: 12.2 g/dL (ref 11.1–15.9)
Immature Grans (Abs): 0 10*3/uL (ref 0.0–0.1)
Immature Granulocytes: 0 %
Lymphocytes Absolute: 2.7 10*3/uL (ref 0.7–3.1)
Lymphs: 40 %
MCH: 28.4 pg (ref 26.6–33.0)
MCHC: 32.4 g/dL (ref 31.5–35.7)
MCV: 88 fL (ref 79–97)
Monocytes Absolute: 0.4 10*3/uL (ref 0.1–0.9)
Monocytes: 6 %
Neutrophils Absolute: 3.4 10*3/uL (ref 1.4–7.0)
Neutrophils: 50 %
Platelets: 279 10*3/uL (ref 150–450)
RBC: 4.29 x10E6/uL (ref 3.77–5.28)
RDW: 14.9 % (ref 11.7–15.4)
WBC: 6.8 10*3/uL (ref 3.4–10.8)

## 2020-10-07 LAB — COMPREHENSIVE METABOLIC PANEL
ALT: 15 IU/L (ref 0–32)
AST: 15 IU/L (ref 0–40)
Albumin/Globulin Ratio: 1.5 (ref 1.2–2.2)
Albumin: 4.1 g/dL (ref 3.8–4.8)
Alkaline Phosphatase: 78 IU/L (ref 44–121)
BUN/Creatinine Ratio: 13 (ref 9–23)
BUN: 10 mg/dL (ref 6–24)
Bilirubin Total: 0.2 mg/dL (ref 0.0–1.2)
CO2: 22 mmol/L (ref 20–29)
Calcium: 9.4 mg/dL (ref 8.7–10.2)
Chloride: 105 mmol/L (ref 96–106)
Creatinine, Ser: 0.8 mg/dL (ref 0.57–1.00)
Globulin, Total: 2.7 g/dL (ref 1.5–4.5)
Glucose: 123 mg/dL — ABNORMAL HIGH (ref 65–99)
Potassium: 4.1 mmol/L (ref 3.5–5.2)
Sodium: 140 mmol/L (ref 134–144)
Total Protein: 6.8 g/dL (ref 6.0–8.5)
eGFR: 90 mL/min/{1.73_m2} (ref >59)

## 2020-10-07 LAB — HEMOGLOBIN A1C
Est. average glucose Bld gHb Est-mCnc: 126 mg/dL
Hgb A1c MFr Bld: 6 % — ABNORMAL HIGH (ref 4.8–5.6)

## 2020-10-07 LAB — THYROID PANEL WITH TSH
Free Thyroxine Index: 1.9 (ref 1.2–4.9)
T3 Uptake Ratio: 27 % (ref 24–39)
T4, Total: 7 ug/dL (ref 4.5–12.0)
TSH: 1.4 u[IU]/mL (ref 0.450–4.500)

## 2020-10-07 LAB — FSH/LH
FSH: 12.4 m[IU]/mL
LH: 5.3 m[IU]/mL

## 2020-10-07 LAB — VITAMIN D 25 HYDROXY (VIT D DEFICIENCY, FRACTURES): Vit D, 25-Hydroxy: 12.6 ng/mL — ABNORMAL LOW (ref 30.0–100.0)

## 2020-10-07 LAB — LIPID PANEL
Chol/HDL Ratio: 3.8 ratio (ref 0.0–4.4)
Cholesterol, Total: 163 mg/dL (ref 100–199)
HDL: 43 mg/dL (ref >39)
LDL Chol Calc (NIH): 89 mg/dL (ref 0–99)
Triglycerides: 181 mg/dL — ABNORMAL HIGH (ref 0–149)
VLDL Cholesterol Cal: 31 mg/dL (ref 5–40)

## 2020-10-13 ENCOUNTER — Other Ambulatory Visit: Payer: Self-pay | Admitting: Physician Assistant

## 2020-10-13 ENCOUNTER — Other Ambulatory Visit: Payer: Self-pay

## 2020-10-13 DIAGNOSIS — E559 Vitamin D deficiency, unspecified: Secondary | ICD-10-CM

## 2020-10-13 MED ORDER — VITAMIN D (ERGOCALCIFEROL) 1.25 MG (50000 UNIT) PO CAPS
50000.0000 [IU] | ORAL_CAPSULE | ORAL | 0 refills | Status: DC
  Filled 2020-10-13 – 2021-01-24 (×2): qty 4, 28d supply, fill #0
  Filled 2021-02-28 – 2021-04-13 (×2): qty 4, 28d supply, fill #1
  Filled 2021-04-14: qty 4, 28d supply, fill #0
  Filled 2021-07-12: qty 4, 28d supply, fill #1
  Filled 2021-08-21 – 2021-10-10 (×4): qty 4, 28d supply, fill #2

## 2020-10-21 ENCOUNTER — Other Ambulatory Visit: Payer: Self-pay

## 2020-11-29 ENCOUNTER — Ambulatory Visit: Payer: Self-pay | Admitting: Internal Medicine

## 2021-01-19 ENCOUNTER — Ambulatory Visit: Payer: Self-pay | Admitting: Physician Assistant

## 2021-01-23 ENCOUNTER — Other Ambulatory Visit: Payer: Self-pay

## 2021-01-23 ENCOUNTER — Encounter: Payer: Self-pay | Admitting: Internal Medicine

## 2021-01-23 ENCOUNTER — Ambulatory Visit: Payer: Self-pay | Attending: Internal Medicine | Admitting: Internal Medicine

## 2021-01-23 VITALS — BP 113/78 | HR 106 | Resp 16 | Ht 65.0 in | Wt 310.6 lb

## 2021-01-23 DIAGNOSIS — R0683 Snoring: Secondary | ICD-10-CM | POA: Insufficient documentation

## 2021-01-23 DIAGNOSIS — R0602 Shortness of breath: Secondary | ICD-10-CM | POA: Insufficient documentation

## 2021-01-23 DIAGNOSIS — R42 Dizziness and giddiness: Secondary | ICD-10-CM | POA: Insufficient documentation

## 2021-01-23 DIAGNOSIS — Z6841 Body Mass Index (BMI) 40.0 and over, adult: Secondary | ICD-10-CM | POA: Insufficient documentation

## 2021-01-23 DIAGNOSIS — Z2821 Immunization not carried out because of patient refusal: Secondary | ICD-10-CM

## 2021-01-23 DIAGNOSIS — Z23 Encounter for immunization: Secondary | ICD-10-CM

## 2021-01-23 DIAGNOSIS — R000 Tachycardia, unspecified: Secondary | ICD-10-CM | POA: Insufficient documentation

## 2021-01-23 DIAGNOSIS — H43393 Other vitreous opacities, bilateral: Secondary | ICD-10-CM | POA: Insufficient documentation

## 2021-01-23 DIAGNOSIS — R030 Elevated blood-pressure reading, without diagnosis of hypertension: Secondary | ICD-10-CM | POA: Insufficient documentation

## 2021-01-23 DIAGNOSIS — E569 Vitamin deficiency, unspecified: Secondary | ICD-10-CM

## 2021-01-23 DIAGNOSIS — M542 Cervicalgia: Secondary | ICD-10-CM | POA: Insufficient documentation

## 2021-01-23 DIAGNOSIS — E559 Vitamin D deficiency, unspecified: Secondary | ICD-10-CM | POA: Insufficient documentation

## 2021-01-23 DIAGNOSIS — D509 Iron deficiency anemia, unspecified: Secondary | ICD-10-CM | POA: Insufficient documentation

## 2021-01-23 MED ORDER — DICLOFENAC SODIUM 1 % EX GEL
2.0000 g | Freq: Four times a day (QID) | CUTANEOUS | 1 refills | Status: DC
  Filled 2021-01-23: qty 100, 16d supply, fill #0

## 2021-01-23 NOTE — Progress Notes (Signed)
Patient ID: Jenna [name], female    DOB: [birth year]  MRN: [id number]  CC: Establish Care   Subjective: Jenna [name] is a 50 y.o. female who presents for chronic disease management.  Previous PCP was Dr. Jillyn Hidden who is no longer with the practice.  She has seen the physician assistant in July. Her concerns today include:  Patient with history of morbid obesity, preDM, OA of the knees, chronic lower back pain.  Depression, vitamin D deficiency,  Vit D def: dx on last visit 09/2020.  Took high dose vit D until she ran out.  Out x 1 mth.  Takes [facility] Also dx with preDM on last visit.  Started using Splenda instead of sugar.  She has stopped dringking soft drinks and has cut back on snacking. Admits she loves potatoes including fries.  Eating keto bars and slim fast bar for snack.  BP a little elev today. Reports being on BP med about 15 yrs ago.  But since then it has not been bad without medication.  C/o pain LT side of neck x 1.5 wks.  Not sure if she slept wrong.  Using Biofreeze which helps at nights. Always has numbness in hands but this is not new.  No initiating factors for the neck pain.  On Cymbalta for LBP and mood.  She finds the Cymbalta to be helpful.  Takes iron QOD for history of iron deficiency anemia.  Last H/H done in July was 12.2/37.6. In the past wk she gets dizziness when sitting; not with position changes. Episodes last for 1-2 mins. No hearing changes. Endorses "a sound in the ears" which last for 5 mins.  Noted to have increased pulse on her vitals today.  I inquired about whether she feels any palpitations.  She tells me she does feel her heart beating faster when she does a lot of walking.  This is not new.  States that she has noticed this for the past 6 months.  She endorses shortness of breath also with minimal exertion which she states she is noted for the past several weeks.  No lower extremity edema.  She sleeps on 3 pillows.  She has not had any recent  long distance traveling.  She is not on any hormonal therapy.  Reports being told that she snores very loud at nights.  Someone told her that she may have sleep apnea.  She feels rested when she wakes up in the mornings but endorses morning headaches and tiredness throughout the day..   Complains of floaters in the visual field over the past several weeks.  HM:  due for pap.  Declines flu shot and pneumonia vaccine.  Due for tdapt Patient Active Problem List   Diagnosis Date Noted   Severe obesity (BMI >= 40) (HCC) 02/08/2020   Insulin resistance 07/28/2019   Depression 07/28/2019   Vitamin D deficiency 12/09/2018   Class 3 severe obesity with serious comorbidity and body mass index (BMI) of 40.0 to 44.9 in adult (HCC) 01/17/2017   Low back pain 05/17/2016   Unilateral primary osteoarthritis, left knee 05/08/2016   Primary osteoarthritis of both knees 06/27/2015   Closed right fibular fracture 08/10/2014     Current Outpatient Medications on File Prior to Visit  Medication Sig Dispense Refill   DULoxetine (CYMBALTA) 60 MG capsule Take one capsule (60mg ) once per day for back pain and mood 90 capsule 0   ferrous sulfate 324 MG TBEC Take 324 mg by mouth every  other day.      fluticasone (FLONASE) 50 MCG/ACT nasal spray Place 2 sprays into both nostrils daily. 16 g 6   hydrOXYzine (ATARAX/VISTARIL) 25 MG tablet TAKE 1 TO 2 TABLETS BY MOUTH EVERY 6 TO 8 HOURS AS NEEDED FOR ITCHING (Patient not taking: Reported on 10/06/2020) 30 tablet 0   methocarbamol (ROBAXIN) 500 MG tablet Take 1 tablet (500 mg total) by mouth 3 (three) times daily. As needed for muscle spasm 90 tablet 1   naproxen (NAPROSYN) 500 MG tablet Take 1 tablet (500 mg total) by mouth 2 (two) times daily with a meal as needed for pain 60 tablet 1   Vitamin D, Ergocalciferol, (DRISDOL) 1.25 MG (50000 UNIT) CAPS capsule Take 1 capsule (50,000 Units total) by mouth every 7 (seven) days. 16 capsule 0   No current  facility-administered medications on file prior to visit.    Allergies  Allergen Reactions   Diflucan [Fluconazole] Hives    Social History   Socioeconomic History   Marital status: Single    Spouse name: Not on file   Number of children: Not on file   Years of education: Not on file   Highest education level: Not on file  Occupational History   Occupation: Stay at home  Tobacco Use   Smoking status: Every Day    Packs/day: 0.10    Types: Cigarettes   Smokeless tobacco: Never  Vaping Use   Vaping Use: Never used  Substance and Sexual Activity   Alcohol use: No    Alcohol/week: 0.0 standard drinks   Drug use: No   Sexual activity: Not on file  Other Topics Concern   Not on file  Social History Narrative   Not on file   Social Determinants of Health   Financial Resource Strain: Not on file  Food Insecurity: Not on file  Transportation Needs: Not on file  Physical Activity: Not on file  Stress: Not on file  Social Connections: Not on file  Intimate Partner Violence: Not on file    Family History  Problem Relation Age of Onset   Diabetes Mother    Hypertension Mother     Past Surgical History:  Procedure Laterality Date   CHOLECYSTECTOMY     TUBAL LIGATION      ROS: Review of Systems Negative except as stated above  PHYSICAL EXAM: BP 113/78   Pulse (!) 106   Resp 16   Ht 5\' 5"  (1.651 m)   Wt (!) 310 lb 9.6 oz (140.9 kg)   SpO2 100%   BMI 51.69 kg/m   Physical Exam Sitting: Blood pressure 135/90, pulse of 105 Standing: Blood pressure 137/86, pulse of 122 General appearance - alert, well appearing, morbidly obese middle-age African-American female and in no distress Mental status - normal mood, behavior, speech, dress, motor activity, and thought processes Eyes -slightly pale conjunctiva. Ears - bilateral TM's and external ear canals normal Mouth - mucous membranes moist, pharynx normal without lesions Neck -no cervical lymphadenopathy.  No  thyroid enlargement or nodules palpated.  No carotid bruits. Chest - clear to auscultation, no wheezes, rales or rhonchi, symmetric air entry Heart -mild tachycardia but regular. Neurological - cranial nerves II through XII intact, motor and sensory grossly normal bilaterally.  Romberg is negative.  Gait is a little wide-based favoring her knees. Musculoskeletal -mild tenderness of the paraspinal muscles on the left side of the neck. Extremities -no lower extremity edema.  Depression screen [facility] 2/9 10/06/2020 01/21/2020 12/08/2018  Decreased Interest  1 0 3  Down, Depressed, Hopeless 0 1 1  PHQ - 2 Score 1 1 4   Altered sleeping 2 2 3   Tired, decreased energy 3 1 2   Change in appetite 2 0 3  Feeling bad or failure about yourself  0 0 0  Trouble concentrating 0 0 0  Moving slowly or fidgety/restless 0 0 2  Suicidal thoughts 0 0 0  PHQ-9 Score 8 4 14   Difficult doing work/chores - - Very difficult    CMP Latest Ref Rng & Units 10/06/2020 09/25/2019 07/28/2019  Glucose 65 - 99 mg/dL 224(M) 250(I) 87  BUN 6 - 24 mg/dL 10 8 11   Creatinine 0.57 - 1.00 mg/dL 3.70 4.88 8.91(Q)  Sodium 134 - 144 mmol/L 140 140 140  Potassium 3.5 - 5.2 mmol/L 4.1 3.6 4.9  Chloride 96 - 106 mmol/L 105 108 109(H)  CO2 20 - 29 mmol/L 22 26 21   Calcium 8.7 - 10.2 mg/dL 9.4 8.9 9.2  Total Protein 6.0 - 8.5 g/dL 6.8 - 6.7  Total Bilirubin 0.0 - 1.2 mg/dL 0.2 - 0.3  Alkaline Phos 44 - 121 IU/L 78 - 75  AST 0 - 40 IU/L 15 - 16  ALT 0 - 32 IU/L 15 - 21   Lipid Panel     Component Value Date/Time   CHOL 163 10/06/2020 1458   TRIG 181 (H) 10/06/2020 1458   HDL 43 10/06/2020 1458   CHOLHDL 3.8 10/06/2020 1458   LDLCALC 89 10/06/2020 1458    CBC    Component Value Date/Time   WBC 6.8 10/06/2020 1458   WBC 8.9 09/25/2019 1548   RBC 4.29 10/06/2020 1458   RBC 4.06 09/25/2019 1548   HGB 12.2 10/06/2020 1458   HCT 37.6 10/06/2020 1458   PLT 279 10/06/2020 1458   MCV 88 10/06/2020 1458   MCH 28.4 10/06/2020  1458   MCH 29.3 09/25/2019 1548   MCHC 32.4 10/06/2020 1458   MCHC 31.9 09/25/2019 1548   RDW 14.9 10/06/2020 1458   LYMPHSABS 2.7 10/06/2020 1458   EOSABS 0.3 10/06/2020 1458   BASOSABS 0.0 10/06/2020 1458   EKG reveals sinus tachycardia with a rate of 102.  Nonspecific T wave abnormalities in leads III and aVF foot.  ASSESSMENT AND PLAN: 1. Acute neck pain Likely strain.  She has methocarbamol at home.  I recommend using it as needed.  Have also given some Voltaren gel to use - diclofenac Sodium (VOLTAREN) 1 % GEL; Apply 2 g topically 4 (four) times daily.  Dispense: 100 g; Refill: 1  2. Morbid obesity (HCC) Dietary counseling given.  Advised to eliminate sugary drinks from her diet, cut back on portion sizes of white carbohydrates, incorporate fresh fruits and vegetables into the diet daily and eat more lean white meat instead of red meat.  Printed information also provided.  3. Dizziness Differential diagnosis can include worsening anemia (as she does have history of iron deficiency), tachycardia.  We will check CBC and TSH today.  Advised to keep herself well-hydrated. - CBC  4. SOB (shortness of breath) Can be due to deconditioning from obesity.  Other things in the differential can include heart failure, PE.  We will check BNP and D-dimer. - Brain natriuretic peptide - D-dimer, quantitative - Basic Metabolic Panel  5. Tachycardia - EKG 12-Lead - TSH  6. Loud snoring - PSG Sleep Study; Future  7. Vitreous floaters of both eyes Recommend that she be seen by an ophthalmologist.  She has the  orange card but this does not cover for eye exams.  8. Vitamin deficiency - VITAMIN D 25 Hydroxy (Vit-D Deficiency, Fractures)  9. Influenza vaccination declined   10. Pneumococcal vaccination declined  11.  Elevated blood pressure Repeat blood pressure done by me today using a large cuff is elevated.  DASH diet discussed and encouraged.  Advised patient that if she has access  to a blood pressure device she should check her blood pressure a few times a week and write down the numbers.  We will get her in with the clinical pharmacist in about 1 month for recheck.  At the end patient ask about a disability form.  I told her that I do not complete disability forms but she can request her records if she is applying for disability.   Patient was given the opportunity to ask questions.  Patient verbalized understanding of the plan and was able to repeat key elements of the plan.   Orders Placed This Encounter  Procedures   VITAMIN D 25 Hydroxy (Vit-D Deficiency, Fractures)   CBC   TSH   Brain natriuretic peptide   D-dimer, quantitative   Basic Metabolic Panel   EKG 12-Lead   PSG Sleep Study     Requested Prescriptions   Signed Prescriptions Disp Refills   diclofenac Sodium (VOLTAREN) 1 % GEL 100 g 1    Sig: Apply 2 g topically 4 (four) times daily.    Return in about 4 months (around 05/23/2021) for Give appt with [facility] in 1 mth for BP check.  Jonah Blue, MD, FACP

## 2021-01-23 NOTE — Patient Instructions (Addendum)
Use the Voltaren gel on the neck.  This is an anti-inflammatory gel.  Use your methocarbamol which is your muscle relaxant as needed.  This will help loosen up the muscles in the neck.  I recommend that she try to get in with an optometrist or ophthalmologist for the floaters.  Healthy Eating Following a healthy eating pattern may help you to achieve and maintain a healthy body weight, reduce the risk of chronic disease, and live a long and productive life. It is important to follow a healthy eating pattern at an appropriate calorie level for your body. Your nutritional needs should be met primarily through food by choosing a variety of nutrient-rich foods. What are tips for following this plan? Reading food labels Read labels and choose the following: Reduced or low sodium. Juices with 100% fruit juice. Foods with low saturated fats and high polyunsaturated and monounsaturated fats. Foods with whole grains, such as whole wheat, cracked wheat, brown rice, and wild rice. Whole grains that are fortified with folic acid. This is recommended for women who are pregnant or who want to become pregnant. Read labels and avoid the following: Foods with a lot of added sugars. These include foods that contain brown sugar, corn sweetener, corn syrup, dextrose, fructose, glucose, high-fructose corn syrup, honey, invert sugar, lactose, malt syrup, maltose, molasses, raw sugar, sucrose, trehalose, or turbinado sugar. Do not eat more than the following amounts of added sugar per day: 6 teaspoons (25 g) for women. 9 teaspoons (38 g) for men. Foods that contain processed or refined starches and grains. Refined grain products, such as white flour, degermed cornmeal, white bread, and white rice. Shopping Choose nutrient-rich snacks, such as vegetables, whole fruits, and nuts. Avoid high-calorie and high-sugar snacks, such as potato chips, fruit snacks, and candy. Use oil-based dressings and spreads on foods  instead of solid fats such as butter, stick margarine, or cream cheese. Limit pre-made sauces, mixes, and "instant" products such as flavored rice, instant noodles, and ready-made pasta. Try more plant-protein sources, such as tofu, tempeh, black beans, edamame, lentils, nuts, and seeds. Explore eating plans such as the Mediterranean diet or vegetarian diet. Cooking Use oil to saut or stir-fry foods instead of solid fats such as butter, stick margarine, or lard. Try baking, boiling, grilling, or broiling instead of frying. Remove the fatty part of meats before cooking. Steam vegetables in water or broth. Meal planning  At meals, imagine dividing your plate into fourths: One-half of your plate is fruits and vegetables. One-fourth of your plate is whole grains. One-fourth of your plate is protein, especially lean meats, poultry, eggs, tofu, beans, or nuts. Include low-fat dairy as part of your daily diet. Lifestyle Choose healthy options in all settings, including home, work, school, restaurants, or stores. Prepare your food safely: Wash your hands after handling raw meats. Keep food preparation surfaces clean by regularly washing with hot, soapy water. Keep raw meats separate from ready-to-eat foods, such as fruits and vegetables. Cook seafood, meat, poultry, and eggs to the recommended internal temperature. Store foods at safe temperatures. In general: Keep cold foods at 44F (4.4C) or below. Keep hot foods at 144F (60C) or above. Keep your freezer at [facility] (-17.8C) or below. Foods are no longer safe to eat when they have been between the temperatures of 40-144F (4.4-60C) for more than 2 hours. What foods should I eat? Fruits Aim to eat 2 cup-equivalents of fresh, canned (in natural juice), or frozen fruits each day. Examples of 1 cup-equivalent  of fruit include 1 small apple, 8 large strawberries, 1 cup canned fruit,  cup dried fruit, or 1 cup 100% juice. Vegetables Aim to eat  2-3 cup-equivalents of fresh and frozen vegetables each day, including different varieties and colors. Examples of 1 cup-equivalent of vegetables include 2 medium carrots, 2 cups raw, leafy greens, 1 cup chopped vegetable (raw or cooked), or 1 medium baked potato. Grains Aim to eat 6 ounce-equivalents of whole grains each day. Examples of 1 ounce-equivalent of grains include 1 slice of bread, 1 cup ready-to-eat cereal, 3 cups popcorn, or  cup cooked rice, pasta, or cereal. Meats and other proteins Aim to eat 5-6 ounce-equivalents of protein each day. Examples of 1 ounce-equivalent of protein include 1 egg, 1/2 cup nuts or seeds, or 1 tablespoon (16 g) peanut butter. A cut of meat or fish that is the size of a deck of cards is about 3-4 ounce-equivalents. Of the protein you eat each week, try to have at least 8 ounces come from seafood. This includes salmon, trout, herring, and anchovies. Dairy Aim to eat 3 cup-equivalents of fat-free or low-fat dairy each day. Examples of 1 cup-equivalent of dairy include 1 cup (240 mL) milk, 8 ounces (250 g) yogurt, 1 ounces (44 g) natural cheese, or 1 cup (240 mL) fortified soy milk. Fats and oils Aim for about 5 teaspoons (21 g) per day. Choose monounsaturated fats, such as canola and olive oils, avocados, peanut butter, and most nuts, or polyunsaturated fats, such as sunflower, corn, and soybean oils, walnuts, pine nuts, sesame seeds, sunflower seeds, and flaxseed. Beverages Aim for six 8-oz glasses of water per day. Limit coffee to three to five 8-oz cups per day. Limit caffeinated beverages that have added calories, such as soda and energy drinks. Limit alcohol intake to no more than 1 drink a day for nonpregnant women and 2 drinks a day for men. One drink equals 12 oz of beer (355 mL), 5 oz of wine (148 mL), or 1 oz of hard liquor (44 mL). Seasoning and other foods Avoid adding excess amounts of salt to your foods. Try flavoring foods with herbs and spices  instead of salt. Avoid adding sugar to foods. Try using oil-based dressings, sauces, and spreads instead of solid fats. This information is based on general [place] nutrition guidelines. For more information, visit [employer]es. Exact amounts may vary based on your nutrition needs. Summary A healthy eating plan may help you to maintain a healthy weight, reduce the risk of chronic diseases, and stay active throughout your life. Plan your meals. Make sure you eat the right portions of a variety of nutrient-rich foods. Try baking, boiling, grilling, or broiling instead of frying. Choose healthy options in all settings, including home, work, school, restaurants, or stores. This information is not intended to replace advice given to you by your health care provider. Make sure you discuss any questions you have with your health care provider. Document Revised: 10/25/2020 Document Reviewed: 10/25/2020 Elsevier Patient Education  Portal.

## 2021-01-24 ENCOUNTER — Other Ambulatory Visit: Payer: Self-pay

## 2021-01-24 ENCOUNTER — Other Ambulatory Visit: Payer: Self-pay | Admitting: Physician Assistant

## 2021-01-24 DIAGNOSIS — L500 Allergic urticaria: Secondary | ICD-10-CM

## 2021-01-24 LAB — BASIC METABOLIC PANEL
BUN/Creatinine Ratio: 8 — ABNORMAL LOW (ref 9–23)
BUN: 7 mg/dL (ref 6–24)
CO2: 21 mmol/L (ref 20–29)
Calcium: 9.2 mg/dL (ref 8.7–10.2)
Chloride: 102 mmol/L (ref 96–106)
Creatinine, Ser: 0.91 mg/dL (ref 0.57–1.00)
Glucose: 103 mg/dL — ABNORMAL HIGH (ref 70–99)
Potassium: 4.2 mmol/L (ref 3.5–5.2)
Sodium: 138 mmol/L (ref 134–144)
eGFR: 77 mL/min/{1.73_m2} (ref >59)

## 2021-01-24 LAB — CBC
Hematocrit: 37.4 % (ref 34.0–46.6)
Hemoglobin: 12.5 g/dL (ref 11.1–15.9)
MCH: 29.5 pg (ref 26.6–33.0)
MCHC: 33.4 g/dL (ref 31.5–35.7)
MCV: 88 fL (ref 79–97)
Platelets: 284 10*3/uL (ref 150–450)
RBC: 4.24 x10E6/uL (ref 3.77–5.28)
RDW: 15.1 % (ref 11.7–15.4)
WBC: 8 10*3/uL (ref 3.4–10.8)

## 2021-01-24 LAB — BRAIN NATRIURETIC PEPTIDE: BNP: 5.9 pg/mL (ref 0.0–100.0)

## 2021-01-24 LAB — D-DIMER, QUANTITATIVE: D-DIMER: 0.39 mg/L FEU (ref 0.00–0.49)

## 2021-01-24 LAB — TSH: TSH: 1.43 u[IU]/mL (ref 0.450–4.500)

## 2021-01-24 LAB — VITAMIN D 25 HYDROXY (VIT D DEFICIENCY, FRACTURES): Vit D, 25-Hydroxy: 14.8 ng/mL — ABNORMAL LOW (ref 30.0–100.0)

## 2021-01-25 ENCOUNTER — Other Ambulatory Visit: Payer: Self-pay

## 2021-01-25 MED ORDER — HYDROXYZINE HCL 25 MG PO TABS
ORAL_TABLET | ORAL | 0 refills | Status: DC
Start: 2021-01-25 — End: 2021-04-13
  Filled 2021-01-25: qty 30, 4d supply, fill #0

## 2021-01-27 ENCOUNTER — Other Ambulatory Visit: Payer: Self-pay

## 2021-02-06 ENCOUNTER — Ambulatory Visit: Payer: Self-pay | Admitting: Internal Medicine

## 2021-02-09 ENCOUNTER — Other Ambulatory Visit: Payer: Self-pay

## 2021-02-09 ENCOUNTER — Ambulatory Visit (INDEPENDENT_AMBULATORY_CARE_PROVIDER_SITE_OTHER): Payer: Self-pay | Admitting: Orthopaedic Surgery

## 2021-02-09 ENCOUNTER — Ambulatory Visit (INDEPENDENT_AMBULATORY_CARE_PROVIDER_SITE_OTHER): Payer: Self-pay

## 2021-02-09 VITALS — Wt 313.0 lb

## 2021-02-09 DIAGNOSIS — G8929 Other chronic pain: Secondary | ICD-10-CM

## 2021-02-09 DIAGNOSIS — M545 Low back pain, unspecified: Secondary | ICD-10-CM

## 2021-02-09 MED ORDER — GABAPENTIN 300 MG PO CAPS
300.0000 mg | ORAL_CAPSULE | Freq: Three times a day (TID) | ORAL | 1 refills | Status: DC | PRN
  Filled 2021-02-09: qty 60, 20d supply, fill #0
  Filled 2021-04-13: qty 60, 20d supply, fill #1
  Filled 2021-04-14: qty 60, 20d supply, fill #0

## 2021-02-09 MED ORDER — TIZANIDINE HCL 4 MG PO TABS
4.0000 mg | ORAL_TABLET | Freq: Three times a day (TID) | ORAL | 0 refills | Status: DC | PRN
  Filled 2021-02-09: qty 60, 20d supply, fill #0

## 2021-02-09 NOTE — Progress Notes (Signed)
The patient is a 50 year old female well-known to [place].  She has been dealing with osteoarthritis of her left knee and at some point is in need of knee replacement but her BMI has been too high for [place] to consider this.  She is coming in today for a weight check and BMI calculation but she is not having significant low back pain with left-sided sciatica.  She continues to have left knee pain as well.  [facility]  They were trying to work on weight loss but she has had to change of the medications.  She does take Cymbalta for low back pain and for her mood.  That has not been effective for low back pain but has helped otherwise.  She does describe pain across lower aspect of her lumbar spine and it does radiate into the left sciatic area.  On exam her left knee does hyperextend as does her right knee.  Her left knee is quite painful to her through the arc of the arc of motion.  She does have a positive straight leg raise on the left side and pain in the lower aspect lumbar spine and left-sided sciatica.  2 views of the lumbar spine show normal alignment and normal maintained disc spaces and disc heights.  The foramina are open as well.  Her weight today is 313 pounds with a BMI of 52.09.  She understands that weight is a factor with both her knees and her back.  We will see if [facility] outpatient [facility] at [facility] we will see her for any modalities that can help decrease her back pain and her left-sided sciatica.  From medicine standpoint we will try some Zanaflex for spasms and Neurontin 300 mg for nerve pain.  I will see her back in 2 months to see how she is doing overall.  We will have a repeat weight and BMI calculation at that standpoint.

## 2021-02-10 ENCOUNTER — Other Ambulatory Visit: Payer: Self-pay

## 2021-02-15 ENCOUNTER — Ambulatory Visit: Payer: Self-pay | Attending: Orthopaedic Surgery

## 2021-02-15 ENCOUNTER — Other Ambulatory Visit: Payer: Self-pay

## 2021-02-15 DIAGNOSIS — M6281 Muscle weakness (generalized): Secondary | ICD-10-CM | POA: Insufficient documentation

## 2021-02-15 DIAGNOSIS — M25511 Pain in right shoulder: Secondary | ICD-10-CM | POA: Insufficient documentation

## 2021-02-15 DIAGNOSIS — M5441 Lumbago with sciatica, right side: Secondary | ICD-10-CM | POA: Insufficient documentation

## 2021-02-15 DIAGNOSIS — M545 Low back pain, unspecified: Secondary | ICD-10-CM | POA: Insufficient documentation

## 2021-02-15 DIAGNOSIS — R293 Abnormal posture: Secondary | ICD-10-CM | POA: Insufficient documentation

## 2021-02-15 DIAGNOSIS — M5442 Lumbago with sciatica, left side: Secondary | ICD-10-CM | POA: Insufficient documentation

## 2021-02-15 DIAGNOSIS — M5386 Other specified dorsopathies, lumbar region: Secondary | ICD-10-CM | POA: Insufficient documentation

## 2021-02-15 DIAGNOSIS — G8929 Other chronic pain: Secondary | ICD-10-CM | POA: Insufficient documentation

## 2021-02-16 NOTE — Therapy (Signed)
[facility] Outpatient Rehabilitation [facility] [address] [place], [place], [address] Phone: [phone]   Fax:  [phone]  Physical Therapy Evaluation  Patient Details  Name: Jenna [name] MRN: [id number] Date of Birth: 1970-07-31 Referring Provider (PT): Kathryne Hitch, MD   Encounter Date: 02/15/2021   PT End of Session - 02/16/21 0610     Visit Number 1    Number of Visits 17    Date for PT Re-Evaluation 04/20/21    Authorization Type [facility] orange card    Authorization Time Period Reassess FOTO on the 6th and 11th visits    Progress Note Due on Visit 10    PT Start Time 1100    PT Stop Time 1150    PT Time Calculation (min) 50 min    Activity Tolerance Patient tolerated treatment well    Behavior During Therapy WFL for tasks assessed/performed             Past Medical History:  Diagnosis Date   Arthritis    Back pain    Chest pain    Depression    Edema of both lower extremities    High blood pressure    Joint pain    Osteoarthritis     Past Surgical History:  Procedure Laterality Date   CHOLECYSTECTOMY     TUBAL LIGATION      There were no vitals filed for this visit.    Subjective Assessment - 02/15/21 1119     Subjective Pt reports LBP for 2-3 years which has gradually wrosened over the past years with intermittent pain down each leg separately or at the same time. Aditionally, pt reports bilat numbess, primarily of her feet and has a Hx of bilat knee pain, L>R. Hoping to have a TKR if she can decrease her BMI.    Pertinent History Arthritis     Back pain     Chest pain     Depression     Edema of both lower extremities     High blood pressure     Joint pain     Osteoarthritis    Obesity    Limitations Standing;Walking;House hold activities    How long can you sit comfortably? Reports she needs to shift alot, about 10 mins    How long can you stand comfortably? 10 mins    How long can you walk comfortably? 5 mins     Diagnostic tests 02/09/21 from Dr. Eliberto Ivory note: 2 views of the lumbar spine show normal alignment and normal maintained disc spaces and disc heights.  The foramina are open as well.    Patient Stated Goals Relief for my back    Currently in Pain? Yes    Pain Score 5    3-9/10   Pain Location Back    Pain Orientation Mid;Lower;Posterior    Pain Descriptors / Indicators Burning;Aching    Pain Type Chronic pain    Pain Onset More than a month ago    Pain Frequency Constant    Aggravating Factors  standing, walking, lifting    Pain Relieving Factors lying down, meds                [facility] PT Assessment - 02/16/21 0001       Assessment   Medical Diagnosis Chronic low back pain, unspecified back pain laterality, unspecified whether sciatica present    Referring Provider (PT) Kathryne Hitch, MD    Onset Date/Surgical Date --   approx 3  years ago   Hand Dominance Right    Prior Therapy No      Precautions   Precautions None      Restrictions   Weight Bearing Restrictions No      Balance Screen   Has the patient fallen in the past 6 months No      Home Environment   Additional Comments Pt reports being able to access and negotiate within her home      Prior Function   Level of Independence Independent    Vocation Unemployed   applying for disability     Cognition   Overall Cognitive Status Within Functional Limits for tasks assessed      Observation/Other Assessments   Observations Frequent shifting in sitting and standing which pt reports is helpful with her pain    Focus on Therapeutic Outcomes (FOTO)  40% functional ability      Sensation   Light Touch Appears Intact      Posture/Postural Control   Posture/Postural Control Postural limitations    Postural Limitations Rounded Shoulders;Forward head;Increased lumbar lordosis;Decreased thoracic kyphosis;Anterior pelvic tilt      ROM / Strength   AROM / PROM / Strength AROM;Strength      AROM   AROM  Assessment Site Lumbar    Lumbar Flexion mod limitation c pulling pain; lordosis does not rreverse c flexion    Lumbar Extension min limitation c achy pain    Lumbar - Right Side Bend min limitation with achy/pulling pain    Lumbar - Left Side Bend min limitation with achy/pulling pain    Lumbar - Right Rotation min limitation with achy/pulling pain    Lumbar - Left Rotation min limitation with achy/pulling pain      Strength   Overall Strength Comments LEs are grossly 4 to 4+/5 limited by pt's report of pain with no overt weakness      Palpation   Palpation comment TTP of the lumbar paraspinals and bilat SI area      Transfers   Transfers Sit to Stand;Stand to Sit    Sit to Stand 6: Modified independent (Device/Increase time)   decreased pace     Ambulation/Gait   Ambulation/Gait Yes    Ambulation/Gait Assistance 6: Modified independent (Device/Increase time)   decreased pace   Gait Pattern Step-through pattern                        Objective measurements completed on examination: See above findings.   [facility] Adult PT Treatment/Exercise:  Therapeutic Exercise: - Seated forward trunk flexion c physioball, x5, 20" - PPT c slight lifts for ab acticivation, x15, 3"              PT Education - 02/16/21 0739     Education Details Eval findings, POC, HEP, options for grab bar to get in/out of tub, reviewd slleeping position and support for less pain during sleep.    Person(s) Educated Patient    Methods Explanation;Demonstration;Tactile cues;Verbal cues    Comprehension Verbalized understanding;Returned demonstration;Verbal cues required;Tactile cues required              PT Short Term Goals - 02/16/21 1336       PT SHORT TERM GOAL #1   Title Pt will be Ind in an initial HEP    Baseline started on eval    Status New    Target Date 03/09/21      PT SHORT TERM GOAL #2  Title Pt will voice understanding of measures to manage and reduce LBP.     Status New    Target Date 03/09/21               PT Long Term Goals - 02/16/21 1346       PT LONG TERM GOAL #1   Title Pt will be Ind in a final HEP to maintain achieved LOF    Status New    Target Date 04/22/21      PT LONG TERM GOAL #2   Title Pt will demonstrate increased forward trunk flexion with decrease in lordotic curve    Baseline lordotic curve no reduced with FF    Status New    Target Date 04/22/21      PT LONG TERM GOAL #3   Title Pt will demonstrate proper body mechanics for household activities    Status New    Target Date 04/22/21      PT LONG TERM GOAL #4   Title Pt's functional ability per FOTO will improve to 54%    Baseline 40%    Status New    Target Date 04/22/21      PT LONG TERM GOAL #5   Title Ptwill report improved LBP and LE pain to 5/10 or less with daily activities    Baseline 3-9/10    Status New    Target Date 04/22/21                    Plan - 02/16/21 0742     Clinical Impression Statement Pt presents to PT c a Hx of chronic LBP and intermittent pain down both LEs separately or at the same time. Significant findings including obesity c increased lordosis and anterior pelvic tilt c increased pain in prolonged sitting and standing. Pt's lumbar lordosis does not reverse with forward flexion. PT was initiated for lumbar flexion biased ROM exs and abdominal strengthening for lumbopelvic stability. Pt will benefit from PT 2w8 to optimize pt's functional ability with decreased LBP and LE pain.    Personal Factors and Comorbidities Comorbidity 1;Comorbidity 2;Comorbidity 3+;Past/Current Experience;Fitness;Time since onset of injury/illness/exacerbation    Comorbidities Arthritis     Back pain     Chest pain     Depression     Edema of both lower extremities     High blood pressure     Joint pain     Osteoarthritis    Obesity    Examination-Activity Limitations Bend;Lift;Stand;Sleep;Locomotion Level;Transfers    Stability/Clinical  Decision Making Evolving/Moderate complexity    Clinical Decision Making Moderate    Rehab Potential Fair    PT Frequency 2x / week    PT Duration 8 weeks    PT Treatment/Interventions ADLs/Self Care Home Management;Aquatic Therapy;Electrical Stimulation;Iontophoresis 4mg /ml Dexamethasone;Moist Heat;Traction;Therapeutic exercise;Therapeutic activities;Balance training;Gait training;Stair training;Functional mobility training;Patient/family education;Manual techniques;Dry needling;Passive range of motion;Taping;Spinal Manipulations    PT Next Visit Plan Review HEP and FOTO. progress ther ex as indicated for lumbopelvic flexibility and strengthening    PT Home Exercise Plan [email or web address]    Consulted and Agree with Plan of Care Patient             Patient will benefit from skilled therapeutic intervention in order to improve the following deficits and impairments:  Difficulty walking, Decreased range of motion, Decreased activity tolerance, Obesity, Pain, Decreased strength, Postural dysfunction  Visit Diagnosis: Chronic bilateral low back pain with bilateral sciatica  Decreased ROM of lumbar spine  Muscle weakness (generalized)  Problem List Patient Active Problem List   Diagnosis Date Noted   Loud snoring 01/23/2021   Severe obesity (BMI >= 40) (HCC) 02/08/2020   Insulin resistance 07/28/2019   Depression 07/28/2019   Vitamin D deficiency 12/09/2018   Class 3 severe obesity with serious comorbidity and body mass index (BMI) of 40.0 to 44.9 in adult (HCC) 01/17/2017   Low back pain 05/17/2016   Unilateral primary osteoarthritis, left knee 05/08/2016   Primary osteoarthritis of both knees 06/27/2015   Closed right fibular fracture 08/10/2014    Joellyn Rued MS, PT 02/16/21 2:01 PM  [facility] Outpatient Rehabilitation [facility] [address] [place], [place], [address] Phone: [phone]   Fax:  [phone]  Name: Jenna [name] MRN: [id number] Date of  Birth: [birth year]

## 2021-02-21 NOTE — Progress Notes (Signed)
° °  S:    PCP: Dr. Laural Benes  Patient presents to the clinic for hypertension evaluation, counseling, and management. Patient was referred and last seen by Primary Care Provider on 01/23/21. Patient without diagnosis of HTN but BP was elevated at last visit.   Today, patient endorses headaches on most days. Reports some dizziness while sitting (not from sitting to standing) 4-5 times in last month.   Current BP Medications include:  none  Antihypertensives tried in the past include: took a medication many years ago (>10 years ago) but doesn't remember what it was  Dietary habits include: tries to limit salt intake, drinks 4 cups of coffee per day. Takes a green tea fat burner supplement (pill).  Exercise habits include: tries to move as much as possible, limited by osteoarthritis in knees/pain in back - started PT for back Family/Social history: HTN in mother, father, grandparents  O:  Vitals:   02/22/21 1134  BP: 118/79  Pulse: 87   Home BP readings: has a cuff at home but hasn't checked it   Last 3 Office BP readings: BP Readings from Last 3 Encounters:  01/23/21 113/78  10/06/20 124/81  09/17/20 135/80   BMET    Component Value Date/Time   NA 138 01/23/2021 1525   K 4.2 01/23/2021 1525   CL 102 01/23/2021 1525   CO2 21 01/23/2021 1525   GLUCOSE 103 (H) 01/23/2021 1525   GLUCOSE 105 (H) 09/25/2019 1548   BUN 7 01/23/2021 1525   CREATININE 0.91 01/23/2021 1525   CALCIUM 9.2 01/23/2021 1525   GFRNONAA >60 09/25/2019 1548   GFRAA >60 09/25/2019 1548   Renal function: CrCl cannot be calculated (Patient's most recent lab result is older than the maximum 21 days allowed.).  Clinical ASCVD: No  The 10-year ASCVD risk score (Arnett DK, et al., 2019) is: 2.4%   Values used to calculate the score:     Age: 89 years     Sex: Female     Is Non-Hispanic African American: Yes     Diabetic: No     Tobacco smoker: Yes     Systolic Blood Pressure: 113 mmHg     Is BP treated:  No     HDL Cholesterol: 43 mg/dL     Total Cholesterol: 163 mg/dL   A/P: Elevated BP at previous visit currently controlled on no medications. BP Goal = <130/80 mmHg. No medications needed at this time.  -Counseled on lifestyle modifications for blood pressure control including reduced dietary sodium, increased exercise, adequate sleep.  Total time in face-to-face counseling 20 minutes. F/U next PCP visit 05/23/21.    Pervis Hocking, PharmD PGY2 Ambulatory Care Pharmacy Resident 02/22/2021 11:28 AM

## 2021-02-22 ENCOUNTER — Ambulatory Visit: Payer: Self-pay | Attending: Internal Medicine | Admitting: Pharmacist

## 2021-02-22 ENCOUNTER — Other Ambulatory Visit: Payer: Self-pay

## 2021-02-22 VITALS — BP 118/79 | HR 87

## 2021-02-22 DIAGNOSIS — R030 Elevated blood-pressure reading, without diagnosis of hypertension: Secondary | ICD-10-CM

## 2021-02-23 ENCOUNTER — Ambulatory Visit: Payer: Self-pay

## 2021-02-27 ENCOUNTER — Ambulatory Visit: Payer: Self-pay | Admitting: Physical Therapy

## 2021-02-27 ENCOUNTER — Encounter: Payer: Self-pay | Admitting: Physical Therapy

## 2021-02-27 ENCOUNTER — Other Ambulatory Visit: Payer: Self-pay

## 2021-02-27 DIAGNOSIS — M6281 Muscle weakness (generalized): Secondary | ICD-10-CM

## 2021-02-27 DIAGNOSIS — G8929 Other chronic pain: Secondary | ICD-10-CM

## 2021-02-27 NOTE — Therapy (Signed)
[facility] Outpatient Rehabilitation [facility] [address] [place], [place], [address] Phone: [phone]   Fax:  [phone]  Physical Therapy Treatment  Patient Details  Name: Jenna [name] MRN: [id number] Date of Birth: May 16, 1970 Referring Provider (PT): Kathryne Hitch, MD   Encounter Date: 02/27/2021   PT End of Session - 02/27/21 0948     Visit Number 2    Number of Visits 17    Date for PT Re-Evaluation 04/20/21    PT Start Time 0948   pt arrived late   PT Stop Time 1014    PT Time Calculation (min) 26 min    Activity Tolerance Patient tolerated treatment well             Past Medical History:  Diagnosis Date   Arthritis    Back pain    Chest pain    Depression    Edema of both lower extremities    High blood pressure    Joint pain    Osteoarthritis     Past Surgical History:  Procedure Laterality Date   CHOLECYSTECTOMY     TUBAL LIGATION      There were no vitals filed for this visit.   Subjective Assessment - 02/27/21 0949     Subjective "I am still having pain in the back.    Currently in Pain? Yes    Pain Score 5     Pain Location Back    Pain Orientation Lower    Pain Descriptors / Indicators Aching;Sore    Pain Onset More than a month ago    Pain Frequency Intermittent    Aggravating Factors  Standing/ walking, bending sitting,    Pain Relieving Factors lying, stretching.                            [facility] Adult PT Treatment:     DATE: 02/27/2021 Therapeutic Exercise: LTR 2 x 10 Posterior pelvic tilt 2 x 10 holding 5 seconds Tactile cues for proper form Supine marching 2 x 10 alternating  Manual Therapy:  MTPR along bil lumbar paraspinals How to perform at home with ball on the wall Therapeutic Activity: Log rolling Self Care: Log rolling with proper form/ technique to reduce abnormal tension/ torque on the back              PT Education - 02/27/21 1001     Education  Details Reviewed HEP and updated today. Reviewed FOTO assessment    Methods Explanation    Comprehension Verbalized understanding              PT Short Term Goals - 02/16/21 1336       PT SHORT TERM GOAL #1   Title Pt will be Ind in an initial HEP    Baseline started on eval    Status New    Target Date 03/09/21      PT SHORT TERM GOAL #2   Title Pt will voice understanding of measures to manage and reduce LBP.    Status New    Target Date 03/09/21               PT Long Term Goals - 02/16/21 1346       PT LONG TERM GOAL #1   Title Pt will be Ind in a final HEP to maintain achieved LOF    Status New    Target Date 04/22/21      PT  LONG TERM GOAL #2   Title Pt will demonstrate increased forward trunk flexion with decrease in lordotic curve    Baseline lordotic curve no reduced with FF    Status New    Target Date 04/22/21      PT LONG TERM GOAL #3   Title Pt will demonstrate proper body mechanics for household activities    Status New    Target Date 04/22/21      PT LONG TERM GOAL #4   Title Pt's functional ability per FOTO will improve to 54%    Baseline 40%    Status New    Target Date 04/22/21      PT LONG TERM GOAL #5   Title Ptwill report improved LBP and LE pain to 5/10 or less with daily activities    Baseline 3-9/10    Status New    Target Date 04/22/21                   Plan - 02/27/21 1015     Clinical Impression Statement Shortened session due to pt arriving late. REviewed HEP and updated today as well as reviewed FOTO assessment. Worked on log rolling and core strengthening which she did require cues for proper form. reviewed trigger point release and how to perform at home.    Comorbidities Arthritis     Back pain     Chest pain     Depression     Edema of both lower extremities     High blood pressure     Joint pain     Osteoarthritis    Obesity    Examination-Activity Limitations Bend;Lift;Stand;Sleep;Locomotion Level;Transfers     PT Treatment/Interventions ADLs/Self Care Home Management;Aquatic Therapy;Electrical Stimulation;Iontophoresis 4mg /ml Dexamethasone;Moist Heat;Traction;Therapeutic exercise;Therapeutic activities;Balance training;Gait training;Stair training;Functional mobility training;Patient/family education;Manual techniques;Dry needling;Passive range of motion;Taping;Spinal Manipulations    PT Next Visit Plan Review HEP  progress ther ex as indicated for lumbopelvic flexibility and strengthening    PT Home Exercise Plan [email or web address]    Consulted and Agree with Plan of Care Patient             Patient will benefit from skilled therapeutic intervention in order to improve the following deficits and impairments:  Difficulty walking, Decreased range of motion, Decreased activity tolerance, Obesity, Pain, Decreased strength, Postural dysfunction  Visit Diagnosis: Chronic bilateral low back pain with bilateral sciatica  Muscle weakness (generalized)  Chronic right shoulder pain     Problem List Patient Active Problem List   Diagnosis Date Noted   Loud snoring 01/23/2021   Severe obesity (BMI >= 40) (HCC) 02/08/2020   Insulin resistance 07/28/2019   Depression 07/28/2019   Vitamin D deficiency 12/09/2018   Class 3 severe obesity with serious comorbidity and body mass index (BMI) of 40.0 to 44.9 in adult (HCC) 01/17/2017   Low back pain 05/17/2016   Unilateral primary osteoarthritis, left knee 05/08/2016   Primary osteoarthritis of both knees 06/27/2015   Closed right fibular fracture 08/10/2014   08/12/2014 PT, DPT, LAT, ATC  02/27/21  10:17 AM     [facility] Outpatient Rehabilitation [facility] [address] [place], [place], [place] Phone: [phone]   Fax:  [phone]  Name: Kellsey Sansone MRN: [name] Date of Birth: 08/25/1970

## 2021-02-27 NOTE — Patient Instructions (Signed)
Access Code: [email or web address] URL: [email or web address] Date: 02/27/2021 Prepared by: Lulu Riding  Exercises Seated Flexion Stretch with Swiss Ball - 4 x daily - 7 x weekly - 1 sets - 10 reps - 20 hold Supine Posterior Pelvic Tilt - 2 x daily - 7 x weekly - 1 sets - 10 reps - 3 hold Supine March - 1 x daily - 7 x weekly - 2 sets - 10 reps - 5 hold Lower Trunk Rotation - 1 x daily - 7 x weekly - 2 sets - 10 reps

## 2021-02-28 ENCOUNTER — Other Ambulatory Visit: Payer: Self-pay

## 2021-02-28 ENCOUNTER — Other Ambulatory Visit: Payer: Self-pay | Admitting: Internal Medicine

## 2021-03-01 ENCOUNTER — Other Ambulatory Visit: Payer: Self-pay

## 2021-03-01 ENCOUNTER — Ambulatory Visit: Payer: Self-pay

## 2021-03-01 VITALS — BP 146/86 | HR 96

## 2021-03-01 DIAGNOSIS — M6281 Muscle weakness (generalized): Secondary | ICD-10-CM

## 2021-03-01 DIAGNOSIS — M5442 Lumbago with sciatica, left side: Secondary | ICD-10-CM

## 2021-03-01 DIAGNOSIS — M5386 Other specified dorsopathies, lumbar region: Secondary | ICD-10-CM

## 2021-03-01 DIAGNOSIS — R293 Abnormal posture: Secondary | ICD-10-CM

## 2021-03-01 MED ORDER — FERROUS SULFATE 324 MG PO TBEC
324.0000 mg | DELAYED_RELEASE_TABLET | ORAL | 1 refills | Status: DC
Start: 2021-03-01 — End: 2022-07-17
  Filled 2021-03-01 – 2021-07-12 (×4): qty 30, 60d supply, fill #0

## 2021-03-01 NOTE — Therapy (Signed)
[facility] Outpatient Rehabilitation [facility] [address] [place], [place], [address] Phone: [phone]   Fax:  [phone]  Physical Therapy Treatment  Patient Details  Name: Jenna [name] MRN: [id number] Date of Birth: [birth year] Referring Provider (PT): Kathryne Hitch, MD   Encounter Date: 03/01/2021   PT End of Session - 03/01/21 1244     Visit Number 3    Number of Visits 17    Date for PT Re-Evaluation 04/20/21    Authorization Type [facility] orange card    Authorization Time Period Reassess FOTO on the 6th and 11th visits    Progress Note Due on Visit 10    PT Start Time 1107    PT Stop Time 1150    PT Time Calculation (min) 43 min    Activity Tolerance Patient limited by pain    Behavior During Therapy [facility] for tasks assessed/performed             Past Medical History:  Diagnosis Date   Arthritis    Back pain    Chest pain    Depression    Edema of both lower extremities    High blood pressure    Joint pain    Osteoarthritis     Past Surgical History:  Procedure Laterality Date   CHOLECYSTECTOMY     TUBAL LIGATION      Vitals:   03/01/21 1246  BP: (!) 146/86  Pulse: 96  SpO2: 99%     Subjective Assessment - 03/01/21 1246     Subjective I'm doing my HEP 2x a day. "I push through the exs despite the pain". I did not tolerate the tennis ball massage to my low back. I was restless sleeping last night, and my chest is sore today. I do not have a Hx of heart issues.    Pertinent History Arthritis     Back pain     Chest pain     Depression     Edema of both lower extremities     High blood pressure     Joint pain     Osteoarthritis    Obesity    Limitations Standing;Walking;House hold activities    How long can you sit comfortably? 15 mins    How long can you stand comfortably? 5 mins    How long can you walk comfortably? 10 steps    Diagnostic tests 02/09/21 from Dr. Eliberto Ivory note: 2 views of the lumbar spine show normal  alignment and normal maintained disc spaces and disc heights.  The foramina are open as well.    Patient Stated Goals Relief for my back    Currently in Pain? Yes    Pain Score 6     Pain Location Back    Pain Orientation Lower;Posterior    Pain Descriptors / Indicators Aching;Burning;Sharp    Pain Type Chronic pain    Pain Radiating Towards No LE pain 03/01/21    Aggravating Factors  Standing, walking, bending, lifting, up/down steps, sweeping, mopping    Pain Relieving Factors Lying down and stretching                        [facility] Adult PT Treatment:     DATE: 02/27/2021 Therapeutic Exercise:   Seated Forward trunk bending along seat of facing chair x5 15" Supine LTR 2 x 10 Supine Posterior pelvic tilt x10 holding 5 seconds Tactile cues for proper form Supine marching x10 alternating  Supine with  knees flexed on bolster- increased pt's LBP Side lying with knees flexed- pt reported relief of pain briefly  - Pt reported LBP and shifted her positioning consistently to minimize pain  Seated Forward trunk bending with downward UE pressure on SPC for trunk support/minimize low back compression 2 x 5 15" Seated Posterior pelvic tilt  2 x10 holding 5 seconds with downward UE pressure on SPC for trunk support/minimize low back compression  Supine marching 2x10 alternating with downward UE pressure on SPC for trunk support/minimize low back compression   - Pt reported improved low back discomfort and better quality of completion c therex in sitting c SPC support  Manual Therapy:  Therapeutic Activity: Log rolling Self Care: Log rolling with proper form/ technique to reduce abnormal tension/ torque on the back. Pt returned demonstration                 PT Education - 03/01/21 1251     Education Details Pt is to complete the HEP c SPC c UE press downs for support/trunk lengthening. All exs except for LTR.    Person(s) Educated Patient    Methods  Explanation;Demonstration;Tactile cues;Verbal cues;Handout    Comprehension Verbalized understanding;Returned demonstration;Verbal cues required;Tactile cues required;Need further instruction              PT Short Term Goals - 02/16/21 1336       PT SHORT TERM GOAL #1   Title Pt will be Ind in an initial HEP    Baseline started on eval    Status New    Target Date 03/09/21      PT SHORT TERM GOAL #2   Title Pt will voice understanding of measures to manage and reduce LBP.    Status New    Target Date 03/09/21               PT Long Term Goals - 02/16/21 1346       PT LONG TERM GOAL #1   Title Pt will be Ind in a final HEP to maintain achieved LOF    Status New    Target Date 04/22/21      PT LONG TERM GOAL #2   Title Pt will demonstrate increased forward trunk flexion with decrease in lordotic curve    Baseline lordotic curve no reduced with FF    Status New    Target Date 04/22/21      PT LONG TERM GOAL #3   Title Pt will demonstrate proper body mechanics for household activities    Status New    Target Date 04/22/21      PT LONG TERM GOAL #4   Title Pt's functional ability per FOTO will improve to 54%    Baseline 40%    Status New    Target Date 04/22/21      PT LONG TERM GOAL #5   Title Ptwill report improved LBP and LE pain to 5/10 or less with daily activities    Baseline 3-9/10    Status New    Target Date 04/22/21                   Plan - 03/01/21 1253     Clinical Impression Statement Pt's vitals were found to be elevated, but appropriate for participation in PT. Pt was instructed in the signs and symptoms of a MI and advised to seek medical attention should she experince these signs and symptoms. With the completion of therex today, pt reported  no issue with chest soreness. Pt was able recall signs and symptoms and voiced understanding. Therex was completed for lumbopelvic flexibility/strengthening initially in supine and pt  consistently reported low back  discomfort and shifted her position for relief. Pt was then positioned in sitting and a SPC was provided in front for trunk support/minimize low back compression, and pt was able to tolerate the therex much better. Pt has a SPC at home and is going to continue her HEP in this manner. Pt has not experinced an overall improvement with her low back pain since the  start of PT. Pt tolerated today's session without an increase in her baseline pain.    Personal Factors and Comorbidities Comorbidity 1;Comorbidity 2;Comorbidity 3+;Past/Current Experience;Fitness;Time since onset of injury/illness/exacerbation    Comorbidities Arthritis     Back pain     Chest pain     Depression     Edema of both lower extremities     High blood pressure     Joint pain     Osteoarthritis    Obesity    Examination-Activity Limitations Bend;Lift;Stand;Sleep;Locomotion Level;Transfers    Stability/Clinical Decision Making Evolving/Moderate complexity    Clinical Decision Making Moderate    Rehab Potential Fair    PT Frequency 2x / week    PT Duration 8 weeks    PT Treatment/Interventions ADLs/Self Care Home Management;Aquatic Therapy;Electrical Stimulation;Iontophoresis 4mg /ml Dexamethasone;Moist Heat;Traction;Therapeutic exercise;Therapeutic activities;Balance training;Gait training;Stair training;Functional mobility training;Patient/family education;Manual techniques;Dry needling;Passive range of motion;Taping;Spinal Manipulations    PT Next Visit Plan Review HEP  progress ther ex as indicated for lumbopelvic flexibility and strengthening. Assess tolerance to HEP completion in sitting c SPC support    PT Home Exercise Plan [id number]    Consulted and Agree with Plan of Care Patient             Patient will benefit from skilled therapeutic intervention in order to improve the following deficits and impairments:  Difficulty walking, Decreased range of motion, Decreased activity tolerance,  Obesity, Pain, Decreased strength, Postural dysfunction  Visit Diagnosis: Chronic bilateral low back pain with bilateral sciatica  Muscle weakness (generalized)  Decreased ROM of lumbar spine  Abnormal posture     Problem List Patient Active Problem List   Diagnosis Date Noted   Loud snoring 01/23/2021   Severe obesity (BMI >= 40) (HCC) 02/08/2020   Insulin resistance 07/28/2019   Depression 07/28/2019   Vitamin D deficiency 12/09/2018   Class 3 severe obesity with serious comorbidity and body mass index (BMI) of 40.0 to 44.9 in adult (HCC) 01/17/2017   Low back pain 05/17/2016   Unilateral primary osteoarthritis, left knee 05/08/2016   Primary osteoarthritis of both knees 06/27/2015   Closed right fibular fracture 08/10/2014    08/12/2014 MS, PT 03/01/21 1:26 PM   [facility] [address] [place], [place], [place] Phone: [phone]   Fax:  [phone]  Name: Jenna [name] MRN: [name] Date of Birth: 1970-07-27

## 2021-03-07 ENCOUNTER — Ambulatory Visit: Payer: Self-pay | Admitting: Physical Therapy

## 2021-03-08 ENCOUNTER — Other Ambulatory Visit: Payer: Self-pay

## 2021-03-09 ENCOUNTER — Encounter: Payer: Self-pay | Admitting: Physical Therapy

## 2021-03-09 ENCOUNTER — Other Ambulatory Visit: Payer: Self-pay

## 2021-03-09 ENCOUNTER — Ambulatory Visit: Payer: Self-pay | Admitting: Physical Therapy

## 2021-03-09 DIAGNOSIS — M5442 Lumbago with sciatica, left side: Secondary | ICD-10-CM

## 2021-03-09 DIAGNOSIS — M6281 Muscle weakness (generalized): Secondary | ICD-10-CM

## 2021-03-09 DIAGNOSIS — M5386 Other specified dorsopathies, lumbar region: Secondary | ICD-10-CM

## 2021-03-09 NOTE — Therapy (Signed)
[facility] Outpatient Rehabilitation [facility] [address] [place], [place], [address] Phone: [phone]   Fax:  [phone]  Physical Therapy Treatment  Patient Details  Name: Jenna [name] MRN: [id number] Date of Birth: 29-Jan-1971 Referring Provider (PT): Kathryne Hitch, MD   Encounter Date: 03/09/2021   PT End of Session - 03/09/21 1016     Visit Number 4    Number of Visits 17    Date for PT Re-Evaluation 04/20/21    Authorization Type [facility] orange card    Authorization Time Period Reassess FOTO on the 6th and 11th visits    PT Start Time 1015    PT Stop Time 1057    PT Time Calculation (min) 42 min    Activity Tolerance Patient limited by pain    Behavior During Therapy [facility] for tasks assessed/performed             Past Medical History:  Diagnosis Date   Arthritis    Back pain    Chest pain    Depression    Edema of both lower extremities    High blood pressure    Joint pain    Osteoarthritis     Past Surgical History:  Procedure Laterality Date   CHOLECYSTECTOMY     TUBAL LIGATION      There were no vitals filed for this visit.   Subjective Assessment - 03/09/21 1018     Subjective "I am still have pain in the leg on my RLE which started about 2 days ago. I wasn't able to make it in the last session due to the pain in the R hip."    Diagnostic tests 02/09/21 from Dr. Eliberto Ivory note: 2 views of the lumbar spine show normal alignment and normal maintained disc spaces and disc heights.  The foramina are open as well.    Currently in Pain? Yes    Pain Score 7     Pain Location Back    Pain Orientation Right;Lower    Pain Type Chronic pain    Pain Onset More than a month ago    Pain Frequency Intermittent    Aggravating Factors  standing, walking,                      [facility] Adult PT Treatment:                                                DATE: 12/29/202. Therapeutic Exercise: Nu-step L 5 x 5 min   Piriformis stretch seated focusing on the R only  Manual Therapy: IASTM along the R glute med/ piriformis  Trigger Point Dry-Needling  Treatment instructions: Expect mild to moderate muscle soreness. S/S of pneumothorax if dry needled over a lung field, and to seek immediate medical attention should they occur. Patient verbalized understanding of these instructions and education.  Patient Consent Given: Yes Education handout provided: Yes Muscles treated: R piriformis Electrical stimulation performed: Yes Parameters:  CPS 20, x 8 min adjusting intermittently Treatment response/outcome: Twitch response elicited and Palpable decrease in muscle tension  Neuromuscular re-ed: Gait training utilizing heel strike/ toe off and reciprocal arm swing Demonstration and cues to avoid walking with knees locked in extension                      PT Short  Term Goals - 02/16/21 1336       PT SHORT TERM GOAL #1   Title Pt will be Ind in an initial HEP    Baseline started on eval    Status New    Target Date 03/09/21      PT SHORT TERM GOAL #2   Title Pt will voice understanding of measures to manage and reduce LBP.    Status New    Target Date 03/09/21               PT Long Term Goals - 02/16/21 1346       PT LONG TERM GOAL #1   Title Pt will be Ind in a final HEP to maintain achieved LOF    Status New    Target Date 04/22/21      PT LONG TERM GOAL #2   Title Pt will demonstrate increased forward trunk flexion with decrease in lordotic curve    Baseline lordotic curve no reduced with FF    Status New    Target Date 04/22/21      PT LONG TERM GOAL #3   Title Pt will demonstrate proper body mechanics for household activities    Status New    Target Date 04/22/21      PT LONG TERM GOAL #4   Title Pt's functional ability per FOTO will improve to 54%    Baseline 40%    Status New    Target Date 04/22/21      PT LONG TERM GOAL #5   Title Ptwill report  improved LBP and LE pain to 5/10 or less with daily activities    Baseline 3-9/10    Status New    Target Date 04/22/21                   Plan - 03/09/21 1034     Clinical Impression Statement pt reports increaesd R hip pain with referral down the RLE. Educated and consent was provided for TPDN focusing on the R piriformis combined with Estim followed with IASTM techniques. she completed all exercises today but did report soreness during session. began PNE noting that hurt doesn't equal harm and controlled exercise is helpful to controlling pain. Time was taken to review gait training and max verbal cues / demonstration were required for heel strike/ toe off.    Comorbidities Arthritis     Back pain     Chest pain     Depression     Edema of both lower extremities     High blood pressure     Joint pain     Osteoarthritis    Obesity    PT Treatment/Interventions ADLs/Self Care Home Management;Aquatic Therapy;Electrical Stimulation;Iontophoresis 4mg /ml Dexamethasone;Moist Heat;Traction;Therapeutic exercise;Therapeutic activities;Balance training;Gait training;Stair training;Functional mobility training;Patient/family education;Manual techniques;Dry needling;Passive range of motion;Taping;Spinal Manipulations    PT Next Visit Plan Review HEP. Response to TPDN, did she work on .  progress ther ex as indicated for lumbopelvic flexibility and strengthening. Assess tolerance to HEP completion in sitting c SPC support    PT Home Exercise Plan [id number]    Consulted and Agree with Plan of Care Patient             Patient will benefit from skilled therapeutic intervention in order to improve the following deficits and impairments:  Difficulty walking, Decreased range of motion, Decreased activity tolerance, Obesity, Pain, Decreased strength, Postural dysfunction  Visit Diagnosis: Chronic bilateral low back pain with bilateral sciatica  Muscle weakness (generalized)  Decreased  ROM of lumbar spine     Problem List Patient Active Problem List   Diagnosis Date Noted   Loud snoring 01/23/2021   Severe obesity (BMI >= 40) (HCC) 02/08/2020   Insulin resistance 07/28/2019   Depression 07/28/2019   Vitamin D deficiency 12/09/2018   Class 3 severe obesity with serious comorbidity and body mass index (BMI) of 40.0 to 44.9 in adult (HCC) 01/17/2017   Low back pain 05/17/2016   Unilateral primary osteoarthritis, left knee 05/08/2016   Primary osteoarthritis of both knees 06/27/2015   Closed right fibular fracture 08/10/2014   Lulu Riding PT, DPT, LAT, ATC  03/09/21  10:58 AM     [facility] [address] [place], [place], [address] Phone: [phone]   Fax:  [phone]  Name: Jenna [name] MRN: [id number] Date of Birth: 10-Oct-1970

## 2021-03-14 ENCOUNTER — Ambulatory Visit: Payer: Self-pay

## 2021-03-16 ENCOUNTER — Ambulatory Visit: Payer: Self-pay | Attending: Orthopaedic Surgery | Admitting: Physical Therapy

## 2021-03-16 DIAGNOSIS — M6281 Muscle weakness (generalized): Secondary | ICD-10-CM | POA: Insufficient documentation

## 2021-03-16 DIAGNOSIS — M5442 Lumbago with sciatica, left side: Secondary | ICD-10-CM | POA: Insufficient documentation

## 2021-03-16 DIAGNOSIS — R293 Abnormal posture: Secondary | ICD-10-CM | POA: Insufficient documentation

## 2021-03-16 DIAGNOSIS — M5386 Other specified dorsopathies, lumbar region: Secondary | ICD-10-CM | POA: Insufficient documentation

## 2021-03-16 DIAGNOSIS — M5441 Lumbago with sciatica, right side: Secondary | ICD-10-CM | POA: Insufficient documentation

## 2021-03-16 DIAGNOSIS — G8929 Other chronic pain: Secondary | ICD-10-CM | POA: Insufficient documentation

## 2021-03-20 ENCOUNTER — Ambulatory Visit: Payer: Self-pay | Admitting: Physical Therapy

## 2021-03-20 ENCOUNTER — Other Ambulatory Visit: Payer: Self-pay

## 2021-03-20 ENCOUNTER — Encounter: Payer: Self-pay | Admitting: Physical Therapy

## 2021-03-20 DIAGNOSIS — M6281 Muscle weakness (generalized): Secondary | ICD-10-CM

## 2021-03-20 DIAGNOSIS — G8929 Other chronic pain: Secondary | ICD-10-CM

## 2021-03-20 NOTE — Therapy (Signed)
[facility] [address] [place], [place], [address] Phone: [phone]   Fax:  [phone]  Physical Therapy Treatment  Patient Details  Name: Jenna GildingBronnita Simmon MRN: [id number] Date of Birth: 07/12/1970 Referring Provider (PT): Kathryne HitchBlackman, Christopher Y, MD   Encounter Date: 03/20/2021   PT End of Session - 03/20/21 1011     Visit Number 5    Number of Visits 17    Date for PT Re-Evaluation 04/20/21    Authorization Type self pay    Authorization Time Period Reassess FOTO on the 6th and 11th visits    Progress Note Due on Visit 10    PT Start Time 1011    PT Stop Time 1056    PT Time Calculation (min) 45 min    Activity Tolerance Patient limited by pain    Behavior During Therapy [facility] for tasks assessed/performed             Past Medical History:  Diagnosis Date   Arthritis    Back pain    Chest pain    Depression    Edema of both lower extremities    High blood pressure    Joint pain    Osteoarthritis     Past Surgical History:  Procedure Laterality Date   CHOLECYSTECTOMY     TUBAL LIGATION      There were no vitals filed for this visit.   Subjective Assessment - 03/20/21 1012     Subjective "I was pretty sore after the DN which took about 4 days for it calm down. I am having my sorenss in my back and knees."    Diagnostic tests 02/09/21 from Dr. Eliberto IvoryBlackman's note: 2 views of the lumbar spine show normal alignment and normal maintained disc spaces and disc heights.  The foramina are open as well.    Patient Stated Goals Relief for my back    Currently in Pain? Yes    Pain Score 4     Pain Location Back    Pain Orientation Right    Pain Descriptors / Indicators Aching    Pain Type Chronic pain    Pain Onset More than a month ago    Pain Frequency Constant    Aggravating Factors  standing/ walking                        [facility] Adult PT Treatment:                                                 DATE: 03/20/2021  Therapeutic Exercise: Nu-step L5 x 6 min UE/LE Low back stretch 2 x 30 sec seated reaching foward Supine posterior pelvic tilt 1 x 10 holding 5 seconds Tactile cues for proper form Supine marching 2 x 20 with GTB around ankles Cues for proper breathing Clamshell 2 x 20 GTB Manual Therapy: MTPR along the lumbar paraspinal How to use theracane for trigger point release. Neuromuscular re-ed: Sit to stand 2 x 10 from elevated table Verbal cues for proper form with scooting forward, rocking forward and standing up/                    PT Short Term Goals - 02/16/21 1336       PT SHORT TERM GOAL #1   Title Pt will  be Ind in an initial HEP    Baseline started on eval    Status New    Target Date 03/09/21      PT SHORT TERM GOAL #2   Title Pt will voice understanding of measures to manage and reduce LBP.    Status New    Target Date 03/09/21               PT Long Term Goals - 02/16/21 1346       PT LONG TERM GOAL #1   Title Pt will be Ind in a final HEP to maintain achieved LOF    Status New    Target Date 04/22/21      PT LONG TERM GOAL #2   Title Pt will demonstrate increased forward trunk flexion with decrease in lordotic curve    Baseline lordotic curve no reduced with FF    Status New    Target Date 04/22/21      PT LONG TERM GOAL #3   Title Pt will demonstrate proper body mechanics for household activities    Status New    Target Date 04/22/21      PT LONG TERM GOAL #4   Title Pt's functional ability per FOTO will improve to 54%    Baseline 40%    Status New    Target Date 04/22/21      PT LONG TERM GOAL #5   Title Ptwill report improved LBP and LE pain to 5/10 or less with daily activities    Baseline 3-9/10    Status New    Target Date 04/22/21                   Plan - 03/20/21 1023     Clinical Impression Statement pt arrives to session noting continued low back pain and bil knee pain today. She did  report some increased soreness for about 4 days following the last session with TPDN, but reported it calmed down following. pt opted to hold off on TPDN and focus on MTPR techniques. Continued working on hip/ core strengthening which she was able to complete but required cues for proper breathing. Trialed practicing gait training with SPC to reduce lateral sway and take pressure off her back and knees.    Personal Factors and Comorbidities Comorbidity 1;Comorbidity 2;Comorbidity 3+;Past/Current Experience;Fitness;Time since onset of injury/illness/exacerbation    Comorbidities Arthritis     Back pain     Chest pain     Depression     Edema of both lower extremities     High blood pressure     Joint pain     Osteoarthritis    Obesity    Examination-Activity Limitations Bend;Lift;Stand;Sleep;Locomotion Level;Transfers    PT Treatment/Interventions ADLs/Self Care Home Management;Aquatic Therapy;Electrical Stimulation;Iontophoresis 4mg /ml Dexamethasone;Moist Heat;Traction;Therapeutic exercise;Therapeutic activities;Balance training;Gait training;Stair training;Functional mobility training;Patient/family education;Manual techniques;Dry needling;Passive range of motion;Taping;Spinal Manipulations    PT Next Visit Plan Review HEP. did she work on .  progress ther ex as indicated for lumbopelvic flexibility and strengthening. Assess tolerance to HEP completion in sitting c SPC support    PT Home Exercise Plan [id number]    Consulted and Agree with Plan of Care Patient             Patient will benefit from skilled therapeutic intervention in order to improve the following deficits and impairments:  Difficulty walking, Decreased range of motion, Decreased activity tolerance, Obesity, Pain, Decreased strength, Postural dysfunction  Visit Diagnosis: Chronic bilateral low  back pain with bilateral sciatica  Muscle weakness (generalized)     Problem List Patient Active Problem List    Diagnosis Date Noted   Loud snoring 01/23/2021   Severe obesity (BMI >= 40) (HCC) 02/08/2020   Insulin resistance 07/28/2019   Depression 07/28/2019   Vitamin D deficiency 12/09/2018   Class 3 severe obesity with serious comorbidity and body mass index (BMI) of 40.0 to 44.9 in adult (HCC) 01/17/2017   Low back pain 05/17/2016   Unilateral primary osteoarthritis, left knee 05/08/2016   Primary osteoarthritis of both knees 06/27/2015   Closed right fibular fracture 08/10/2014   Lulu Riding PT, DPT, LAT, ATC  03/20/21  10:59 AM      [facility] [address] [place], [place], [address] Phone: [phone]   Fax:  [phone]  Name: Kerrigan Gombos MRN: [id number] Date of Birth: [birth year]

## 2021-03-21 ENCOUNTER — Ambulatory Visit: Payer: Self-pay

## 2021-03-23 ENCOUNTER — Ambulatory Visit: Payer: Self-pay

## 2021-03-24 ENCOUNTER — Ambulatory Visit: Payer: Self-pay | Attending: Internal Medicine

## 2021-03-24 ENCOUNTER — Other Ambulatory Visit: Payer: Self-pay

## 2021-03-30 ENCOUNTER — Other Ambulatory Visit: Payer: Self-pay

## 2021-03-30 ENCOUNTER — Ambulatory Visit: Payer: Self-pay

## 2021-03-30 DIAGNOSIS — G8929 Other chronic pain: Secondary | ICD-10-CM

## 2021-03-30 DIAGNOSIS — M5386 Other specified dorsopathies, lumbar region: Secondary | ICD-10-CM

## 2021-03-30 DIAGNOSIS — M6281 Muscle weakness (generalized): Secondary | ICD-10-CM

## 2021-03-30 DIAGNOSIS — R293 Abnormal posture: Secondary | ICD-10-CM

## 2021-03-30 NOTE — Therapy (Addendum)
[place], [place], [address] Phone: [phone]   Fax:  [phone]  Physical Therapy Treatment/Discharge  Patient Details  Name: Jenna [name] MRN: [id number] Date of Birth: [birth year] Referring Provider (PT): Mcarthur Rossetti, MD   Encounter Date: 03/30/2021   PT End of Session - 03/30/21 1549     Visit Number 6    Number of Visits 17    Date for PT Re-Evaluation 04/20/21    Authorization Type self pay    Authorization Time Period Reassess FOTO on the 6th and 11th visits    Progress Note Due on Visit 10    PT Start Time 1547    PT Stop Time 1630    PT Time Calculation (min) 43 min    Activity Tolerance Patient limited by pain    Behavior During Therapy [facility] for tasks assessed/performed             Past Medical History:  Diagnosis Date   Arthritis    Back pain    Chest pain    Depression    Edema of both lower extremities    High blood pressure    Joint pain    Osteoarthritis     Past Surgical History:  Procedure Laterality Date   CHOLECYSTECTOMY     TUBAL LIGATION      There were no vitals filed for this visit.   Subjective Assessment - 03/30/21 1554     Subjective My back still hurts. Standing and walking still increase my back pain.    Pertinent History Arthritis     Back pain     Chest pain     Depression     Edema of both lower extremities     High blood pressure     Joint pain     Osteoarthritis    Obesity    Limitations Standing;Walking;House hold activities    Diagnostic tests 02/09/21 from Dr. Trevor Mace note: 2 views of the lumbar spine show normal alignment and normal maintained disc spaces and disc heights.  The foramina are open as well.    Currently in Pain? Yes    Pain Score 5     Pain Location Back    Pain Orientation Right    Pain Descriptors / Indicators Aching    Pain Type Chronic pain    Pain Onset More than a month ago    Pain Frequency Constant     Aggravating Factors  standing/ walking    Pain Relieving Factors Lying down and stretching                               [facility] Adult PT Treatment:                                                DATE: 03/30/2021   Therapeutic Exercise: Nu-step L5 x 5 min UE/LE Repoted of knee  Low back stretch 5 x 10 sec seated reaching forward rolling forward with theraball Seated downward  arm presess on foam roller c abdominal activiation x10, 3" Seated marching 2 x 20  5.   Seated Clamshell 2 x 20 GTB  6.    LAQ x10  7.    Standing shoulder row, x15  8.    Standing shoulder  ext x15  Rests breaks were provided with standing exs with pt reporting knee pain   Neuromuscular re-ed: Sit to stand x10 from elevated table            PT Short Term Goals - 02/16/21 1336       PT SHORT TERM GOAL #1   Title Pt will be Ind in an initial HEP    Baseline started on eval    Status New    Target Date 03/09/21      PT SHORT TERM GOAL #2   Title Pt will voice understanding of measures to manage and reduce LBP.    Status New    Target Date 03/09/21               PT Long Term Goals - 02/16/21 1346       PT LONG TERM GOAL #1   Title Pt will be Ind in a final HEP to maintain achieved LOF    Status New    Target Date 04/22/21      PT LONG TERM GOAL #2   Title Pt will demonstrate increased forward trunk flexion with decrease in lordotic curve    Baseline lordotic curve no reduced with FF    Status New    Target Date 04/22/21      PT LONG TERM GOAL #3   Title Pt will demonstrate proper body mechanics for household activities    Status New    Target Date 04/22/21      PT LONG TERM GOAL #4   Title Pt's functional ability per FOTO will improve to 54%    Baseline 40%    Status New    Target Date 04/22/21      PT LONG TERM GOAL #5   Title Ptwill report improved LBP and LE pain to 5/10 or less with daily activities    Baseline 3-9/10    Status New    Target Date  04/22/21                   Plan - 03/30/21 1601     Clinical Impression Statement PT was completd for lumbopelvic flexibility and strengthening. Pt reported her low back did not feel comfortable lying in supine. Therex were then completd in sitting and standing. Standing tolerance was limited by knee pain. Pt reported low back/hip/and/or knee pain during the session, but did not demonstrate an overall increase in pain after the session with pt walking with an appropriate gait pattern and normal pace.  Will assess pt's response to today's session next appt, benefit for continuation vs. Dc.    Personal Factors and Comorbidities Comorbidity 1;Comorbidity 2;Comorbidity 3+;Past/Current Experience;Fitness;Time since onset of injury/illness/exacerbation    Comorbidities Arthritis     Back pain     Chest pain     Depression     Edema of both lower extremities     High blood pressure     Joint pain     Osteoarthritis    Obesity    Examination-Activity Limitations Bend;Lift;Stand;Sleep;Locomotion Level;Transfers    Stability/Clinical Decision Making Evolving/Moderate complexity    Clinical Decision Making Moderate    Rehab Potential Fair    PT Frequency 2x / week    PT Duration 8 weeks    PT Treatment/Interventions ADLs/Self Care Home Management;Aquatic Therapy;Electrical Stimulation;Iontophoresis 77m/ml Dexamethasone;Moist Heat;Traction;Therapeutic exercise;Therapeutic activities;Balance training;Gait training;Stair training;Functional mobility training;Patient/family education;Manual techniques;Dry needling;Passive range of motion;Taping;Spinal Manipulations    PT Next Visit Plan Review HEP/ posture especially  with prolonged standing. review sit to stand biomechanics.  progress ther ex as indicated for lumbopelvic flexibility and strengthening. Assess tolerance to HEP completion in sitting c SPC support    PT Home Exercise Plan [id number]    Consulted and Agree with Plan of Care Patient              Patient will benefit from skilled therapeutic intervention in order to improve the following deficits and impairments:  Difficulty walking, Decreased range of motion, Decreased activity tolerance, Obesity, Pain, Decreased strength, Postural dysfunction  Visit Diagnosis: Chronic bilateral low back pain with bilateral sciatica  Muscle weakness (generalized)  Decreased ROM of lumbar spine  Abnormal posture     Problem List Patient Active Problem List   Diagnosis Date Noted   Loud snoring 01/23/2021   Severe obesity (BMI >= 40) (HCC) 02/08/2020   Insulin resistance 07/28/2019   Depression 07/28/2019   Vitamin D deficiency 12/09/2018   Class 3 severe obesity with serious comorbidity and body mass index (BMI) of 40.0 to 44.9 in adult ([place]) 01/17/2017   Low back pain 05/17/2016   Unilateral primary osteoarthritis, left knee 05/08/2016   Primary osteoarthritis of both knees 06/27/2015   Closed right fibular fracture 08/10/2014    Gar Ponto, PT 03/30/2021, 6:22 PM  PHYSICAL THERAPY DISCHARGE SUMMARY  Visits from Start of Care: 6  Current functional level related to goals / functional outcomes: See above   Remaining deficits: See above   Education / Equipment: HEP   Patient agrees to discharge. Patient goals were not met. Patient is being discharged due to not returning since the last visit.  Gar Ponto MS, PT 05/09/21 10:45 AM    [facility] [address] [place], [place], [address] Phone: [phone]   Fax:  [phone]  Name: Lenetta Piche MRN: [id number] Date of Birth: May 13, 1970

## 2021-04-05 ENCOUNTER — Ambulatory Visit: Payer: Self-pay

## 2021-04-05 ENCOUNTER — Telehealth: Payer: Self-pay

## 2021-04-05 NOTE — Telephone Encounter (Signed)
LVM for no show appt. Pt has no other appts scheduled. Advised pt she may scheduled another appt, but if the facility does not hear from her in 1 month, she will be Dced from PT services

## 2021-04-12 ENCOUNTER — Ambulatory Visit (INDEPENDENT_AMBULATORY_CARE_PROVIDER_SITE_OTHER): Payer: Self-pay | Admitting: Orthopaedic Surgery

## 2021-04-12 ENCOUNTER — Encounter: Payer: Self-pay | Admitting: Orthopaedic Surgery

## 2021-04-12 ENCOUNTER — Ambulatory Visit: Payer: Self-pay

## 2021-04-12 ENCOUNTER — Other Ambulatory Visit: Payer: Self-pay

## 2021-04-12 VITALS — Wt 319.0 lb

## 2021-04-12 DIAGNOSIS — M545 Low back pain, unspecified: Secondary | ICD-10-CM

## 2021-04-12 DIAGNOSIS — M542 Cervicalgia: Secondary | ICD-10-CM

## 2021-04-12 DIAGNOSIS — G8929 Other chronic pain: Secondary | ICD-10-CM

## 2021-04-12 MED ORDER — PREDNISONE 50 MG PO TABS
ORAL_TABLET | ORAL | 0 refills | Status: DC
  Filled 2021-04-12: qty 5, 5d supply, fill #0

## 2021-04-12 NOTE — Telephone Encounter (Signed)
° °  Chief Complaint: Tingling left arm Symptoms: Neck pain  Frequency: Started 1 week ago Pertinent Negatives: Patient denies weakness Disposition: [] ED /[] Urgent Care (no appt availability in office) / [] Appointment(In office/virtual)/ []  [place] [facility]/ [] Home Care/ [] Refused Recommended Disposition /[x] [facility]/ []  Follow-up with PCP Additional Notes:    Reason for Disposition  Neck pain (and neurologic deficit)  Answer Assessment - Initial Assessment Questions 1. SYMPTOM: "What is the main symptom you are concerned about?" (e.g., weakness, numbness)     Left tingling and numbness 2. ONSET: "When did this start?" (minutes, hours, days; while sleeping)     Started 1 week ago 3. LAST NORMAL: "When was the last time you (the patient) were normal (no symptoms)?"     2 weeks ago 4. PATTERN "Does this come and go, or has it been constant since it started?"  "Is it present now?"     Comes and neck 5. CARDIAC SYMPTOMS: "Have you had any of the following symptoms: chest pain, difficulty breathing, palpitations?"     No 6. NEUROLOGIC SYMPTOMS: "Have you had any of the following symptoms: headache, dizziness, vision loss, double vision, changes in speech, unsteady on your feet?"     Neck pain 7. OTHER SYMPTOMS: "Do you have any other symptoms?"     No 8. PREGNANCY: "Is there any chance you are pregnant?" "When was your last menstrual period?"     no  Protocols used: Neurologic Deficit-A-AH

## 2021-04-12 NOTE — Progress Notes (Signed)
The patient is someone that I have seen before.  She is only 51 years old and does have severe arthritis in her left knee.  We wanted to see her back today for a new weight and BMI calculation.  She has been working on diet but she is actually gaining weight.  Her BMI now today is 53.08.  She has been dealing with a weeks worth of neck pain and numbness and tingling down her left arm after sleeping wrong she states.  She is not a diabetic that she is aware of.  She does have full range of motion of her neck as well as her left shoulder elbow and hand.  There is subjective numbness going down the left arm but no weakness on my exam.  She is already on Neurontin and Zanaflex.  I counseled her again about weight loss.  I did offer her 5 days of prednisone 50 mg to see if this will help with the radicular pain and we can always try therapy if that does not help and increasing Neurontin.  All questions and concerns were answered addressed.  From my standpoint I do not need to see her back for 6 months unless things are worsening and at that visit we will have a new weight and BMI calculation.  She understands that we cannot proceed with surgery safely on her left knee until she loses significant weight.

## 2021-04-13 ENCOUNTER — Other Ambulatory Visit: Payer: Self-pay | Admitting: Orthopaedic Surgery

## 2021-04-13 ENCOUNTER — Other Ambulatory Visit: Payer: Self-pay | Admitting: Family Medicine

## 2021-04-13 ENCOUNTER — Other Ambulatory Visit: Payer: Self-pay

## 2021-04-13 DIAGNOSIS — L500 Allergic urticaria: Secondary | ICD-10-CM

## 2021-04-13 MED ORDER — HYDROXYZINE HCL 25 MG PO TABS
ORAL_TABLET | ORAL | 0 refills | Status: DC
  Filled 2021-04-13: qty 30, fill #0
  Filled 2021-04-14: qty 30, 3d supply, fill #0

## 2021-04-13 MED ORDER — TIZANIDINE HCL 4 MG PO TABS
4.0000 mg | ORAL_TABLET | Freq: Three times a day (TID) | ORAL | 0 refills | Status: DC | PRN
  Filled 2021-04-13 – 2021-04-14 (×2): qty 60, 20d supply, fill #0

## 2021-04-14 ENCOUNTER — Other Ambulatory Visit: Payer: Self-pay

## 2021-04-17 ENCOUNTER — Other Ambulatory Visit: Payer: Self-pay

## 2021-04-19 ENCOUNTER — Other Ambulatory Visit: Payer: Self-pay

## 2021-05-01 ENCOUNTER — Telehealth: Payer: Self-pay | Admitting: Orthopaedic Surgery

## 2021-05-01 NOTE — Telephone Encounter (Signed)
Received call from pt, needs last 2 ov notes faxed to her atty @ [employer]'s office. I advised she needs to sign authorization or she can reach out to their office to send their request. I emailed her release form [email or web address] and she stated she is going to call her atty office as well.

## 2021-05-23 ENCOUNTER — Other Ambulatory Visit: Payer: Self-pay

## 2021-05-23 ENCOUNTER — Encounter: Payer: Self-pay | Admitting: Internal Medicine

## 2021-05-23 ENCOUNTER — Ambulatory Visit: Payer: Self-pay | Attending: Internal Medicine | Admitting: Internal Medicine

## 2021-05-23 DIAGNOSIS — R0683 Snoring: Secondary | ICD-10-CM

## 2021-05-23 DIAGNOSIS — F320 Major depressive disorder, single episode, mild: Secondary | ICD-10-CM

## 2021-05-23 DIAGNOSIS — M25512 Pain in left shoulder: Secondary | ICD-10-CM

## 2021-05-23 DIAGNOSIS — E559 Vitamin D deficiency, unspecified: Secondary | ICD-10-CM

## 2021-05-23 DIAGNOSIS — Z1211 Encounter for screening for malignant neoplasm of colon: Secondary | ICD-10-CM

## 2021-05-23 DIAGNOSIS — G8929 Other chronic pain: Secondary | ICD-10-CM

## 2021-05-23 DIAGNOSIS — R030 Elevated blood-pressure reading, without diagnosis of hypertension: Secondary | ICD-10-CM

## 2021-05-23 DIAGNOSIS — M170 Bilateral primary osteoarthritis of knee: Secondary | ICD-10-CM

## 2021-05-23 DIAGNOSIS — R7303 Prediabetes: Secondary | ICD-10-CM

## 2021-05-23 LAB — POCT GLYCOSYLATED HEMOGLOBIN (HGB A1C): HbA1c, POC (prediabetic range): 5.8 % (ref 5.7–6.4)

## 2021-05-23 NOTE — Progress Notes (Signed)
? ? ?Patient ID: Jenna [name], female    DOB: 1970/06/28  MRN: [id number] ? ?CC: Chronic disease management. ? ?Subjective: ?Jenna [name] is a 51 y.o. female who presents for chronic disease management. ?Her concerns today include:  ?Patient with history of morbid obesity, preDM, OA of the knees, chronic lower back pain.  Depression (on Cymbalta for LBP and mood), vitamin D deficiency, ? ?OA knees:  saw Dr. Magnus Ivan x 2 since last visit.  Needs TKR on LT but has to get wgh down.  Also diagnosed with chronic lower back pain with left-sided sciatica.  Placed on Zanaflex and gabapentin.  Referred for some physical therapy which she has completed. ?Today she complains of pain in the left shoulder that started about 2 months ago.  No initiating factors. ?Dull ache. ?Worse when laying on the left shoulder or when lifting objects. ?Noticed numbness in the fingers of the left hand and tingling in the left upper arm. ? ?Obesity/PreDM:  ?A1C 5.8 ?Tries to exercise but it hurts. Does squats and stretch bands for arms and legs.  Also uses a floor pedaler ?-Started taking  ProBio Slim from [place]. Probiotic that suppresses appetite. Started taking 04/12/2021.  Reports it has decreased her appetite and has taken her cravings down.  Loss 6 lbs so far ?BP elevated on last visit.Recheck BP by pharmacist 02/2021 was nl. ?Limits salt ?Has a device at home but not checking ? ?Vit D def:  taking the high dose vit D ? ?Referred for sleep study on last visit.  No appt as yet.  Now has OC. ? ?Pos Dep screen:  had 2 recent family deaths - a cousin and uncle.  "My body hurts and I can't do as much as I use to do." ?Still on Cymbalta.  Helpful ? ?HM:  due for colon CA screen,  no fhx of colon CA.  Due for PAP.   ? ?Patient Active Problem List  ? Diagnosis Date Noted  ? Loud snoring 01/23/2021  ? Severe obesity (BMI >= 40) (HCC) 02/08/2020  ? Insulin resistance 07/28/2019  ? Depression 07/28/2019  ? Vitamin D deficiency 12/09/2018  ? Class 3  severe obesity with serious comorbidity and body mass index (BMI) of 40.0 to 44.9 in adult [facility]) 01/17/2017  ? Low back pain 05/17/2016  ? Unilateral primary osteoarthritis, left knee 05/08/2016  ? Primary osteoarthritis of both knees 06/27/2015  ? Closed right fibular fracture 08/10/2014  ?  ? ?Current Outpatient Medications on File Prior to Visit  ?Medication Sig Dispense Refill  ? diclofenac Sodium (VOLTAREN) 1 % GEL Apply 2 g topically 4 (four) times daily. 100 g 1  ? DULoxetine (CYMBALTA) 60 MG capsule Take one capsule (60mg ) once per day for back pain and mood 90 capsule 0  ? ferrous sulfate 324 MG TBEC Take 1 tablet (324 mg total) by mouth every other day. 30 tablet 1  ? fluticasone (FLONASE) 50 MCG/ACT nasal spray Place 2 sprays into both nostrils daily. 16 g 6  ? gabapentin (NEURONTIN) 300 MG capsule Take 1 capsule (300 mg total) by mouth 3 (three) times daily as needed. 60 capsule 1  ? hydrOXYzine (ATARAX) 25 MG tablet TAKE 1 TO 2 TABLETS BY MOUTH EVERY 6 TO 8 HOURS AS NEEDED FOR ITCHING 30 tablet 0  ? methocarbamol (ROBAXIN) 500 MG tablet Take 1 tablet (500 mg total) by mouth 3 (three) times daily. As needed for muscle spasm 90 tablet 1  ? naproxen (NAPROSYN) 500 MG tablet Take  1 tablet (500 mg total) by mouth 2 (two) times daily with a meal as needed for pain 60 tablet 1  ? tiZANidine (ZANAFLEX) 4 MG tablet Take 1 tablet (4 mg total) by mouth every 8 (eight) hours as needed for muscle spasms. 60 tablet 0  ? Vitamin D, Ergocalciferol, (DRISDOL) 1.25 MG (50000 UNIT) CAPS capsule Take 1 capsule (50,000 Units total) by mouth every 7 (seven) days. 16 capsule 0  ? ?No current facility-administered medications on file prior to visit.  ? ? ?Allergies  ?Allergen Reactions  ? Diflucan [Fluconazole] Hives  ? ? ?Social History  ? ?Socioeconomic History  ? Marital status: Single  ?  Spouse name: Not on file  ? Number of children: Not on file  ? Years of education: Not on file  ? Highest education level: Not on file   ?Occupational History  ? Occupation: Stay at home  ?Tobacco Use  ? Smoking status: Every Day  ?  Packs/day: 0.10  ?  Types: Cigarettes  ? Smokeless tobacco: Never  ?Vaping Use  ? Vaping Use: Never used  ?Substance and Sexual Activity  ? Alcohol use: No  ?  Alcohol/week: 0.0 standard drinks  ? Drug use: No  ? Sexual activity: Not on file  ?Other Topics Concern  ? Not on file  ?Social History Narrative  ? Not on file  ? ?Social Determinants of Health  ? ?Financial Resource Strain: Not on file  ?Food Insecurity: Not on file  ?Transportation Needs: Not on file  ?Physical Activity: Not on file  ?Stress: Not on file  ?Social Connections: Not on file  ?Intimate Partner Violence: Not on file  ? ? ?Family History  ?Problem Relation Age of Onset  ? Diabetes Mother   ? Hypertension Mother   ? ? ?Past Surgical History:  ?Procedure Laterality Date  ? CHOLECYSTECTOMY    ? TUBAL LIGATION    ? ? ?ROS: ?Review of Systems ?Negative except as stated above ? ?PHYSICAL EXAM: ?BP 130/85   Pulse 93   Resp 16   Wt (!) 313 lb 12.8 oz (142.3 kg)   SpO2 98%   BMI 52.22 kg/m?   ?Wt Readings from Last 3 Encounters:  ?05/23/21 (!) 313 lb 12.8 oz (142.3 kg)  ?04/12/21 (!) 319 lb (144.7 kg)  ?02/09/21 (!) 313 lb (142 kg)  ? ? ?Physical Exam ? ?General appearance - alert, well appearing, and in no distress ?Mental status - normal mood, behavior, speech, dress, motor activity, and thought processes ?Neck - supple, no significant adenopathy ?Chest - clear to auscultation, no wheezes, rales or rhonchi, symmetric air entry ?Heart - normal rate, regular rhythm, normal S1, S2, no murmurs, rubs, clicks or gallops ?Musculoskeletal -left shoulder: Mild point tenderness on the anterior joint.  Mild discomfort with passive range of motion in all direction.  Drop arm test positive. ?Grip 4/5 bilaterally but I question effort.  Power in the upper extremity 5/5 on the right.  She gives to pain on the left. ?Extremities -no lower extremity  edema. ? ?[facility] 05/23/2021 10/06/2020 01/21/2020  ?Decreased Interest 1 1 0  ?Down, Depressed, Hopeless 2 0 1  ?PHQ - 2 Score 3 1 1   ?Altered sleeping 1 2 2   ?Tired, decreased energy 3 3 1   ?Change in appetite 0 2 0  ?Feeling bad or failure about yourself  0 0 0  ?Trouble concentrating 0 0 0  ?Moving slowly or fidgety/restless 3 0 0  ?Suicidal thoughts 0 0  0  ?PHQ-9 Score 10 8 4   ?Difficult doing work/chores - - -  ? ?generalized anxiety disorder ?GAD 7 : Generalized Anxiety Score 10/06/2020 01/21/2020 07/23/2018 05/05/2018  ?Nervous, Anxious, on Edge 0 2 1 0  ?Control/stop worrying 0 1 3 2   ?Worry too much - different things 0 2 3 1   ?Trouble relaxing 0 1 1 1   ?Restless 0 1 0 1  ?Easily annoyed or irritable 1 1 1 2   ?Afraid - awful might happen 0 0 1 1  ?Total GAD 7 Score 1 8 10 8   ?Anxiety Difficulty - - Somewhat difficult -  ? ? ? ? ?CMP Latest Ref Rng & Units 01/23/2021 10/06/2020 09/25/2019  ?Glucose 70 - 99 mg/dL ) ) )  ?BUN 6 - 24 mg/dL 7 10 8   ?Creatinine 0.57 - 1.00 mg/dL 01/25/2021 10/08/2020 09/27/2019  ?Sodium 134 - 144 mmol/L 138 140 140  ?Potassium 3.5 - 5.2 mmol/L 4.2 4.1 3.6  ?Chloride 96 - 106 mmol/L 102 105 108  ?CO2 20 - 29 mmol/L 21 22 26   ?Calcium 8.7 - 10.2 mg/dL 9.2 9.4 8.9  ?Total Protein 6.0 - 8.5 g/dL - 6.8 -  ?Total Bilirubin 0.0 - 1.2 mg/dL - 0.2 -  ?Alkaline Phos 44 - 121 IU/L - 78 -  ?AST 0 - 40 IU/L - 15 -  ?ALT 0 - 32 IU/L - 15 -  ? ?Lipid Panel  ?   ?Component Value Date/Time  ? CHOL 163 10/06/2020 1458  ? TRIG 181 (H) 10/06/2020 1458  ? HDL 43 10/06/2020 1458  ? CHOLHDL 3.8 10/06/2020 1458  ? LDLCALC 89 10/06/2020 1458  ? ? ?CBC ?   ?Component Value Date/Time  ? WBC 8.0 01/23/2021 1525  ? WBC 8.9 09/25/2019 1548  ? RBC 4.24 01/23/2021 1525  ? RBC 4.06 09/25/2019 1548  ? HGB 12.5 01/23/2021 1525  ? HCT 37.4 01/23/2021 1525  ? PLT 284 01/23/2021 1525  ? MCV 88 01/23/2021 1525  ? MCH 29.5 01/23/2021 1525  ? MCH 29.3 09/25/2019 1548  ? MCHC 33.4 01/23/2021 1525  ? MCHC 31.9  09/25/2019 1548  ? RDW 15.1 01/23/2021 1525  ? LYMPHSABS 2.7 10/06/2020 1458  ? EOSABS 0.3 10/06/2020 1458  ? BASOSABS 0.0 10/06/2020 1458  ? ? ?ASSESSMENT AND PLAN: ? ?1. Morbid obesity (HCC) ?Commended on weight loss so fa

## 2021-05-23 NOTE — Patient Instructions (Addendum)
Go to radiology at [facility] to get the x-ray of your shoulder. ? ?Stop at the lab to get the stool kit for colon cancer screening.  ? ?Try to get [employer]. ?

## 2021-05-25 ENCOUNTER — Telehealth: Payer: Self-pay

## 2021-05-25 NOTE — Telephone Encounter (Signed)
Reached out the [facility] had to lvm. ? ?Left a detailed vm making aware of who I am and pt information to reach out to her to get scheduled for sleep study and if they have any questions or concerns to give me a call  ?

## 2021-05-25 NOTE — Telephone Encounter (Signed)
-----   Message from Marcine Matar, MD sent at 05/23/2021  5:32 PM EDT ----- ?Regarding: Sleep study ?Pt now has [employer].  She was never called by [facility] sleep lab to schedule for sleep study.  Can you please call them to follow-up on this?  Thanks ? ?

## 2021-06-30 ENCOUNTER — Ambulatory Visit (HOSPITAL_BASED_OUTPATIENT_CLINIC_OR_DEPARTMENT_OTHER): Payer: Self-pay | Attending: Internal Medicine | Admitting: Internal Medicine

## 2021-06-30 VITALS — Ht 65.0 in | Wt 319.0 lb

## 2021-06-30 DIAGNOSIS — R0683 Snoring: Secondary | ICD-10-CM | POA: Insufficient documentation

## 2021-07-09 DIAGNOSIS — R0683 Snoring: Secondary | ICD-10-CM

## 2021-07-09 NOTE — Procedures (Signed)
? ? ?  Patient Name: Jenna [name], Jenna [name] ?Study Date: 06/30/2021 ?Gender: Female ?D.O.B: Jan 06, 1971 ?Age (years): 26 ?Referring Provider: Karle Plumber MD ?Height (inches): 65 ?Interpreting Physician: Baird Lyons MD, ABSM ?Weight (lbs): 319 ?RPSGT: Jorge Ny ?BMI: 53 ?MRN: [id number] ?Neck Size: 15.00 ? ?CLINICAL INFORMATION ?Sleep Study Type: NPSG ?Indication for sleep study: Morbid Obesity, Snoring ?Epworth Sleepiness Score: 18 ? ?SLEEP STUDY TECHNIQUE ?As per the AASM Manual for the Scoring of Sleep and Associated Events v2.3 (April 2016) with a hypopnea requiring 4% desaturations. ? ?The channels recorded and monitored were frontal, central and occipital EEG, electrooculogram (EOG), submentalis EMG (chin), nasal and oral airflow, thoracic and abdominal wall motion, anterior tibialis EMG, snore microphone, electrocardiogram, and pulse oximetry. ? ?MEDICATIONS ?Medications self-administered by patient taken the night of the study : Tizandine ? ?SLEEP ARCHITECTURE ?The study was initiated at 10:19:01 PM and ended at 5:01:42 AM. ? ?Sleep onset time was 68.8 minutes and the sleep efficiency was 74.9%%. The total sleep time was 301.5 minutes. ? ?Stage REM latency was 148.5 minutes. ? ?The patient spent 10.6%% of the night in stage N1 sleep, 70.1%% in stage N2 sleep, 0.0%% in stage N3 and 19.2% in REM. ? ?Alpha intrusion was absent. ? ?Supine sleep was 60.86%. ? ?RESPIRATORY PARAMETERS ?The overall apnea/hypopnea index (AHI) was 4.6 per hour. There were 6 total apneas, including 6 obstructive, 0 central and 0 mixed apneas. There were 17 hypopneas and 131 RERAs. ? ?The AHI during Stage REM sleep was 20.7 per hour. ? ?AHI while supine was 4.3 per hour. ? ?The mean oxygen saturation was 95.0%. The minimum SpO2 during sleep was 86.0%. ? ?loud snoring was noted during this study. ? ?CARDIAC DATA ?The 2 lead EKG demonstrated sinus rhythm. The mean heart rate was 82.4 beats per minute. Other EKG findings include:  PVCs. ? ?LEG MOVEMENT DATA ?The total PLMS were 0 with a resulting PLMS index of 0.0. Associated arousal with leg movement index was 5.4 . ? ?IMPRESSIONS ?- No significant obstructive sleep apnea occurred during this study (AHI = 4.6/h). ?- Mild oxygen desaturation was noted during this study (Min O2 = 86.0%). Mean 95% ?- The patient snored with loud snoring volume. ?- EKG findings include PVCs. ?- Total Limb Movements 136 (27/ hr). Limb Movements with Arousal 27 (5.4/ hr). ? ?DIAGNOSIS ?- Primary Snoring ?- Limb Movement Sleep Disturbance ? ?RECOMMENDATIONS ?- Consider trial of Mirapex, Requip, or Sinemet for treatment of Periodic Leg Movements of Sleep. ?- Be careful with alcohol, sedatives and other CNS depressants that may worsen sleep apnea and disrupt normal sleep architecture. ?- Sleep hygiene should be reviewed to assess factors that may improve sleep quality. ?- Weight management and regular exercise should be initiated or continued if appropriate. ? ?[Electronically signed] 07/09/2021 12:16 PM ? ?Baird Lyons MD, ABSM ?Diplomate, Tax adviser of Sleep Medicine ?NPI: [id number] ? ?  ? ? ? ? ? ? ? ? ? ? ? ? ? ? ? ? ? ? ? ? ? ?Rodneisha Bonnet ?Diplomate, Tax adviser of Sleep Medicine ? ?ELECTRONICALLY SIGNED ON:  07/09/2021, 12:13 PM ?[place] ?PH: [phone]   FX: [phone] ?ACCREDITED BY THE AMERICAN ACADEMY OF SLEEP MEDICINE ?

## 2021-07-11 ENCOUNTER — Ambulatory Visit: Payer: Self-pay | Attending: Internal Medicine | Admitting: Internal Medicine

## 2021-07-11 ENCOUNTER — Other Ambulatory Visit: Payer: Self-pay

## 2021-07-11 ENCOUNTER — Encounter: Payer: Self-pay | Admitting: Internal Medicine

## 2021-07-11 ENCOUNTER — Other Ambulatory Visit (HOSPITAL_COMMUNITY)
Admission: RE | Admit: 2021-07-11 | Discharge: 2021-07-11 | Disposition: A | Discharge status: Home / Self Care | Payer: Self-pay | Source: Ambulatory Visit | Attending: Internal Medicine | Admitting: Internal Medicine

## 2021-07-11 VITALS — BP 133/86 | HR 98 | Temp 98.4°F | Resp 16 | Ht 65.0 in | Wt 314.0 lb

## 2021-07-11 DIAGNOSIS — Z1159 Encounter for screening for other viral diseases: Secondary | ICD-10-CM

## 2021-07-11 DIAGNOSIS — G4761 Periodic limb movement disorder: Secondary | ICD-10-CM

## 2021-07-11 DIAGNOSIS — Z114 Encounter for screening for human immunodeficiency virus [HIV]: Secondary | ICD-10-CM

## 2021-07-11 DIAGNOSIS — Z124 Encounter for screening for malignant neoplasm of cervix: Secondary | ICD-10-CM

## 2021-07-11 DIAGNOSIS — R030 Elevated blood-pressure reading, without diagnosis of hypertension: Secondary | ICD-10-CM

## 2021-07-11 DIAGNOSIS — Z1231 Encounter for screening mammogram for malignant neoplasm of breast: Secondary | ICD-10-CM

## 2021-07-11 MED ORDER — ROPINIROLE HCL 0.5 MG PO TABS
0.5000 mg | ORAL_TABLET | Freq: Every day | ORAL | 1 refills | Status: DC
  Filled 2021-07-11: qty 30, 30d supply, fill #0
  Filled 2021-10-10 – 2022-02-13 (×3): qty 30, 30d supply, fill #1

## 2021-07-11 NOTE — Progress Notes (Signed)
? ? ?Patient ID: Jenna [name], female    DOB: 13-Dec-1970  MRN: [id number] ? ?CC: Gynecologic Exam ? ? ?Subjective: ?Jenna [name] is a 51 y.o. female who presents for pap ?Her concerns today include:  ?Patient with history of morbid obesity, preDM, OA of the knees, chronic lower back pain.  Depression (on Cymbalta for LBP and mood), vitamin D deficiency, ? ?GYN History:  ?Pt is G6P6 ?Any hx of abn paps?: no ?Menses regular or irregular?:  regular but for past 2 mths she had only spotting for a few days ?How long does menses last? 5 days except for past 2 mths ?Menstrual flow light or heavy?:  moderate but light for past 2 mths ?Method of birth control?:  tubal ligation ?Any vaginal dischg at this time?:  no ?Dysuria?: no ?Any hx of STI?:  Trichomonas last yr. Treated ?Sexually active with how many partners: one female partner ?Desires STI screen: yes ?Last MMG:  due for one ?Family hx of uterine, cervical or breast cancer?:  no ? ?Turned in FIT kit today.   ? ?Elev BP on last visit.  Limiting salt ?Checks BP daily.  Reports range 120s/77-80 ? ?Patient had sleep study done since last visit.  This revealed loud snoring but no sleep apnea.  She had periodic limb movement with arousal. ? ?Patient Active Problem List  ? Diagnosis Date Noted  ? Loud snoring 01/23/2021  ? Severe obesity (BMI >= 40) ([place]) 02/08/2020  ? Insulin resistance 07/28/2019  ? Depression 07/28/2019  ? Vitamin D deficiency 12/09/2018  ? Class 3 severe obesity with serious comorbidity and body mass index (BMI) of 40.0 to 44.9 in adult [facility]) 01/17/2017  ? Low back pain 05/17/2016  ? Unilateral primary osteoarthritis, left knee 05/08/2016  ? Primary osteoarthritis of both knees 06/27/2015  ? Closed right fibular fracture 08/10/2014  ?  ? ?Current Outpatient Medications on File Prior to Visit  ?Medication Sig Dispense Refill  ? DULoxetine (CYMBALTA) 60 MG capsule Take one capsule (56m) once per day for back pain and mood 90 capsule 0  ? ferrous sulfate  324 MG TBEC Take 1 tablet (324 mg total) by mouth every other day. 30 tablet 1  ? fluticasone (FLONASE) 50 MCG/ACT nasal spray Place 2 sprays into both nostrils daily. 16 g 6  ? gabapentin (NEURONTIN) 300 MG capsule Take 1 capsule (300 mg total) by mouth 3 (three) times daily as needed. 60 capsule 1  ? hydrOXYzine (ATARAX) 25 MG tablet TAKE 1 TO 2 TABLETS BY MOUTH EVERY 6 TO 8 HOURS AS NEEDED FOR ITCHING 30 tablet 0  ? methocarbamol (ROBAXIN) 500 MG tablet Take 1 tablet (500 mg total) by mouth 3 (three) times daily. As needed for muscle spasm 90 tablet 1  ? naproxen (NAPROSYN) 500 MG tablet Take 1 tablet (500 mg total) by mouth 2 (two) times daily with a meal as needed for pain 60 tablet 1  ? diclofenac Sodium (VOLTAREN) 1 % GEL Apply 2 g topically 4 (four) times daily. (Patient not taking: Reported on 07/11/2021) 100 g 1  ? tiZANidine (ZANAFLEX) 4 MG tablet Take 1 tablet (4 mg total) by mouth every 8 (eight) hours as needed for muscle spasms. (Patient not taking: Reported on 07/11/2021) 60 tablet 0  ? Vitamin D, Ergocalciferol, (DRISDOL) 1.25 MG (50000 UNIT) CAPS capsule Take 1 capsule (50,000 Units total) by mouth every 7 (seven) days. (Patient not taking: Reported on 07/11/2021) 16 capsule 0  ? ?No current facility-administered medications on file  prior to visit.  ? ? ?Allergies  ?Allergen Reactions  ? Diflucan [Fluconazole] Hives  ? ? ?Social History  ? ?Socioeconomic History  ? Marital status: Single  ?  Spouse name: Not on file  ? Number of children: Not on file  ? Years of education: Not on file  ? Highest education level: Not on file  ?Occupational History  ? Occupation: Stay at home  ?Tobacco Use  ? Smoking status: Every Day  ?  Packs/day: 0.10  ?  Types: Cigarettes  ? Smokeless tobacco: Never  ?Vaping Use  ? Vaping Use: Never used  ?Substance and Sexual Activity  ? Alcohol use: No  ?  Alcohol/week: 0.0 standard drinks  ? Drug use: No  ? Sexual activity: Not on file  ?Other Topics Concern  ? Not on file  ?Social  History Narrative  ? Not on file  ? ?Social Determinants of Health  ? ?Financial Resource Strain: Not on file  ?Food Insecurity: Not on file  ?Transportation Needs: Not on file  ?Physical Activity: Not on file  ?Stress: Not on file  ?Social Connections: Not on file  ?Intimate Partner Violence: Not on file  ? ? ?Family History  ?Problem Relation Age of Onset  ? Diabetes Mother   ? Hypertension Mother   ? ? ?Past Surgical History:  ?Procedure Laterality Date  ? CHOLECYSTECTOMY    ? TUBAL LIGATION    ? ? ?ROS: ?Review of Systems ?Negative except as stated above ? ?PHYSICAL EXAM: ?BP 133/86 (BP Location: Left Arm, Patient Position: Sitting, Cuff Size: Large)   Pulse 98   Temp 98.4 ?F (36.9 ?C) (Oral)   Resp 16   Ht '5\' 5"'  (1.651 m)   Wt (!) 314 lb (142.4 kg)   LMP 06/15/2021 (Approximate)   SpO2 99%   BMI 52.25 kg/m?   ?Physical Exam ?BP 123/92 ?General appearance - alert, well appearing, and in no distress ?Mental status - normal mood, behavior, speech, dress, motor activity, and thought processes ?Breasts: RN Carilyn Goodpasture present for breast and pelvic exam: No axillary lymphadenopathy.  Breasts are pendulous.  No palpable masses. ?Pelvic - normal external genitalia, vulva, vagina, cervix, uterus and adnexa ? ? ? ?  Latest Ref Rng & Units 01/23/2021  ?  3:25 PM 10/06/2020  ?  2:58 PM 09/25/2019  ?  3:48 PM  ?CMP  ?Glucose 70 - 99 mg/dL 103   123   105    ?BUN 6 - 24 mg/dL '7   10   8    ' ?Creatinine 0.57 - 1.00 mg/dL 0.91   0.80   0.89    ?Sodium 134 - 144 mmol/L 138   140   140    ?Potassium 3.5 - 5.2 mmol/L 4.2   4.1   3.6    ?Chloride 96 - 106 mmol/L 102   105   108    ?CO2 20 - 29 mmol/L '21   22   26    ' ?Calcium 8.7 - 10.2 mg/dL 9.2   9.4   8.9    ?Total Protein 6.0 - 8.5 g/dL  6.8     ?Total Bilirubin 0.0 - 1.2 mg/dL  0.2     ?Alkaline Phos 44 - 121 IU/L  78     ?AST 0 - 40 IU/L  15     ?ALT 0 - 32 IU/L  15     ? ?Lipid Panel  ?   ?Component Value Date/Time  ? CHOL 163 10/06/2020  1458  ? TRIG 181 (H)  10/06/2020 1458  ? HDL 43 10/06/2020 1458  ? CHOLHDL 3.8 10/06/2020 1458  ? [place] 89 10/06/2020 1458  ? ? ?CBC ?   ?Component Value Date/Time  ? WBC 8.0 01/23/2021 1525  ? WBC 8.9 09/25/2019 1548  ? RBC 4.24 01/23/2021 1525  ? RBC 4.06 09/25/2019 1548  ? HGB 12.5 01/23/2021 1525  ? HCT 37.4 01/23/2021 1525  ? PLT 284 01/23/2021 1525  ? MCV 88 01/23/2021 1525  ? MCH 29.5 01/23/2021 1525  ? MCH 29.3 09/25/2019 1548  ? MCHC 33.4 01/23/2021 1525  ? MCHC 31.9 09/25/2019 1548  ? RDW 15.1 01/23/2021 1525  ? LYMPHSABS 2.7 10/06/2020 1458  ? EOSABS 0.3 10/06/2020 1458  ? BASOSABS 0.0 10/06/2020 1458  ? ? ?ASSESSMENT AND PLAN: ?1. Pap smear for cervical cancer screening ?- Cervicovaginal ancillary only ?- Cytology - PAP ? ?2. Encounter for screening mammogram for malignant neoplasm of breast ?- MM Digital Screening; Future ? ?3. Screening for HIV (human immunodeficiency virus) ?- HIV antibody (with reflex) ? ?4. Need for hepatitis C screening test ?- Hepatitis C Antibody ? ?5. Elevated blood-pressure reading without diagnosis of hypertension ?Systolic blood pressure still elevated but patient reports good readings at home.  She will continue DASH diet.  I have requested that she write down her numbers and return to see our clinical pharmacist in 1 month. ? ?6. Periodic limb movement ?Went over sleep study results with her.  No significant obstructive sleep apnea occurred despite snoring.  Advised to be careful with alcohol, sedatives as they may disrupt normal sleep architecture.  Good sleep hygiene encouraged. ?Discussed trial of low-dose Requip for the periodic limb movement.  Advised to be vigilant for any emergence of compulsive behaviors while on this medication.  If it occurs, she should let me know. ?- rOPINIRole (REQUIP) 0.5 MG tablet; Take 1 tablet (0.5 mg total) by mouth at bedtime.  Dispense: 30 tablet; Refill: 1 ? ? ? ?Patient was given the opportunity to ask questions.  Patient verbalized understanding of the  plan and was able to repeat key elements of the plan.  ? ?This documentation was completed using Radio producer.  Any transcriptional errors are unintentional. ? ?No orders of the defined types w

## 2021-07-11 NOTE — Progress Notes (Signed)
Request refill for Vitamin D and tizanidine ?

## 2021-07-11 NOTE — Patient Instructions (Addendum)
Continue to monitor Blood pressure.  Record readings and bring with you in 1 month for repeat blood pressure check ? ? ?

## 2021-07-12 ENCOUNTER — Other Ambulatory Visit: Payer: Self-pay | Admitting: Internal Medicine

## 2021-07-12 ENCOUNTER — Ambulatory Visit: Payer: Self-pay | Admitting: Registered"

## 2021-07-12 ENCOUNTER — Other Ambulatory Visit: Payer: Self-pay

## 2021-07-12 ENCOUNTER — Encounter: Payer: Self-pay | Admitting: Internal Medicine

## 2021-07-12 LAB — CERVICOVAGINAL ANCILLARY ONLY
Bacterial Vaginitis (gardnerella): POSITIVE — AB
Candida Glabrata: NEGATIVE
Candida Vaginitis: NEGATIVE
Chlamydia: NEGATIVE
Comment: NEGATIVE
Comment: NEGATIVE
Comment: NEGATIVE
Comment: NEGATIVE
Comment: NEGATIVE
Comment: NORMAL
Neisseria Gonorrhea: NEGATIVE
Trichomonas: NEGATIVE

## 2021-07-12 LAB — HEPATITIS C ANTIBODY: Hep C Virus Ab: NONREACTIVE

## 2021-07-12 LAB — HIV ANTIBODY (ROUTINE TESTING W REFLEX): HIV Screen 4th Generation wRfx: NONREACTIVE

## 2021-07-12 MED ORDER — METRONIDAZOLE 500 MG PO TABS
500.0000 mg | ORAL_TABLET | Freq: Two times a day (BID) | ORAL | 0 refills | Status: DC
  Filled 2021-07-12: qty 14, 7d supply, fill #0

## 2021-07-13 ENCOUNTER — Other Ambulatory Visit: Payer: Self-pay

## 2021-07-14 ENCOUNTER — Encounter: Payer: Self-pay | Admitting: Internal Medicine

## 2021-07-14 LAB — CYTOLOGY - PAP
Comment: NEGATIVE
Diagnosis: UNDETERMINED — AB
High risk HPV: NEGATIVE

## 2021-07-14 LAB — FECAL OCCULT BLOOD, IMMUNOCHEMICAL: Fecal Occult Bld: POSITIVE — AB

## 2021-07-15 ENCOUNTER — Encounter: Payer: Self-pay | Admitting: Internal Medicine

## 2021-07-15 ENCOUNTER — Other Ambulatory Visit: Payer: Self-pay | Admitting: Internal Medicine

## 2021-07-15 DIAGNOSIS — R195 Other fecal abnormalities: Secondary | ICD-10-CM

## 2021-07-17 ENCOUNTER — Other Ambulatory Visit: Payer: Self-pay

## 2021-07-17 DIAGNOSIS — Z1231 Encounter for screening mammogram for malignant neoplasm of breast: Secondary | ICD-10-CM

## 2021-07-17 NOTE — Addendum Note (Signed)
Addended by: Narda Rutherford on: 07/17/2021 12:59 PM ? ? Modules accepted: Orders ? ?

## 2021-07-28 ENCOUNTER — Encounter: Payer: Self-pay | Admitting: Internal Medicine

## 2021-07-28 ENCOUNTER — Ambulatory Visit (HOSPITAL_COMMUNITY)
Admission: EM | Admit: 2021-07-28 | Discharge: 2021-07-28 | Disposition: A | Discharge status: Home / Self Care | Payer: Self-pay | Attending: Family Medicine | Admitting: Family Medicine

## 2021-07-28 ENCOUNTER — Encounter (HOSPITAL_COMMUNITY): Payer: Self-pay | Admitting: Emergency Medicine

## 2021-07-28 ENCOUNTER — Other Ambulatory Visit: Payer: Self-pay

## 2021-07-28 ENCOUNTER — Ambulatory Visit (INDEPENDENT_AMBULATORY_CARE_PROVIDER_SITE_OTHER): Payer: Self-pay

## 2021-07-28 DIAGNOSIS — R200 Anesthesia of skin: Secondary | ICD-10-CM

## 2021-07-28 DIAGNOSIS — M5412 Radiculopathy, cervical region: Secondary | ICD-10-CM

## 2021-07-28 DIAGNOSIS — M25512 Pain in left shoulder: Secondary | ICD-10-CM

## 2021-07-28 DIAGNOSIS — M542 Cervicalgia: Secondary | ICD-10-CM

## 2021-07-28 DIAGNOSIS — R202 Paresthesia of skin: Secondary | ICD-10-CM

## 2021-07-28 IMAGING — DX DG SHOULDER 2+V*L*
3 series · 3 of 3 positions shown · non-contrast
Comparison: No recent relevant priors available for comparison
dictation.

CLINICAL DATA: Shoulder pain since [REDACTED].

EXAM:
LEFT SHOULDER - 2+ VIEW

[shoulder grashey]
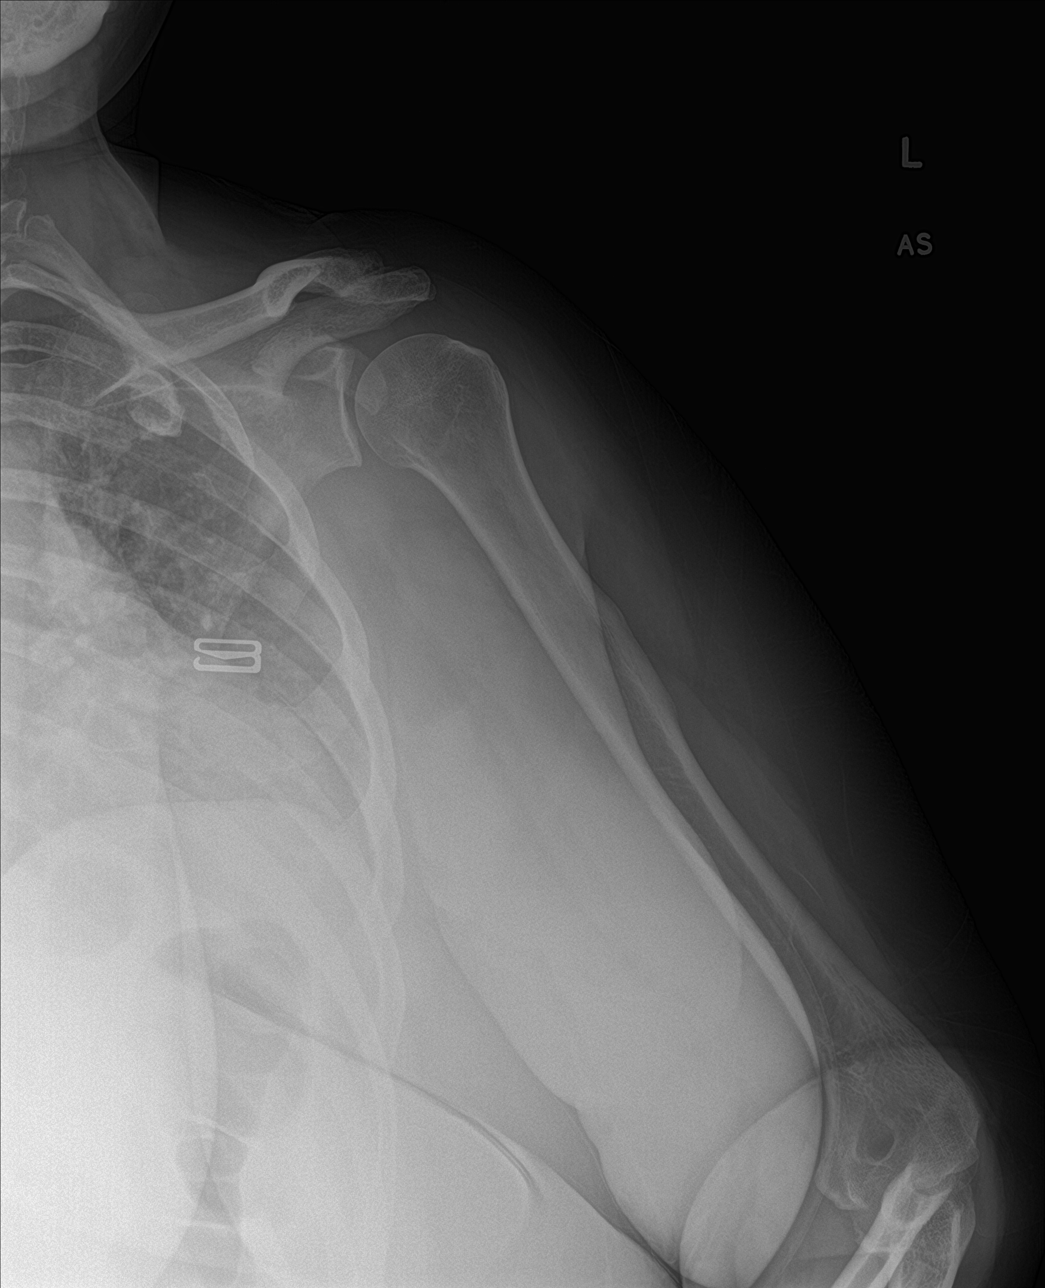

[shoulder y-view]
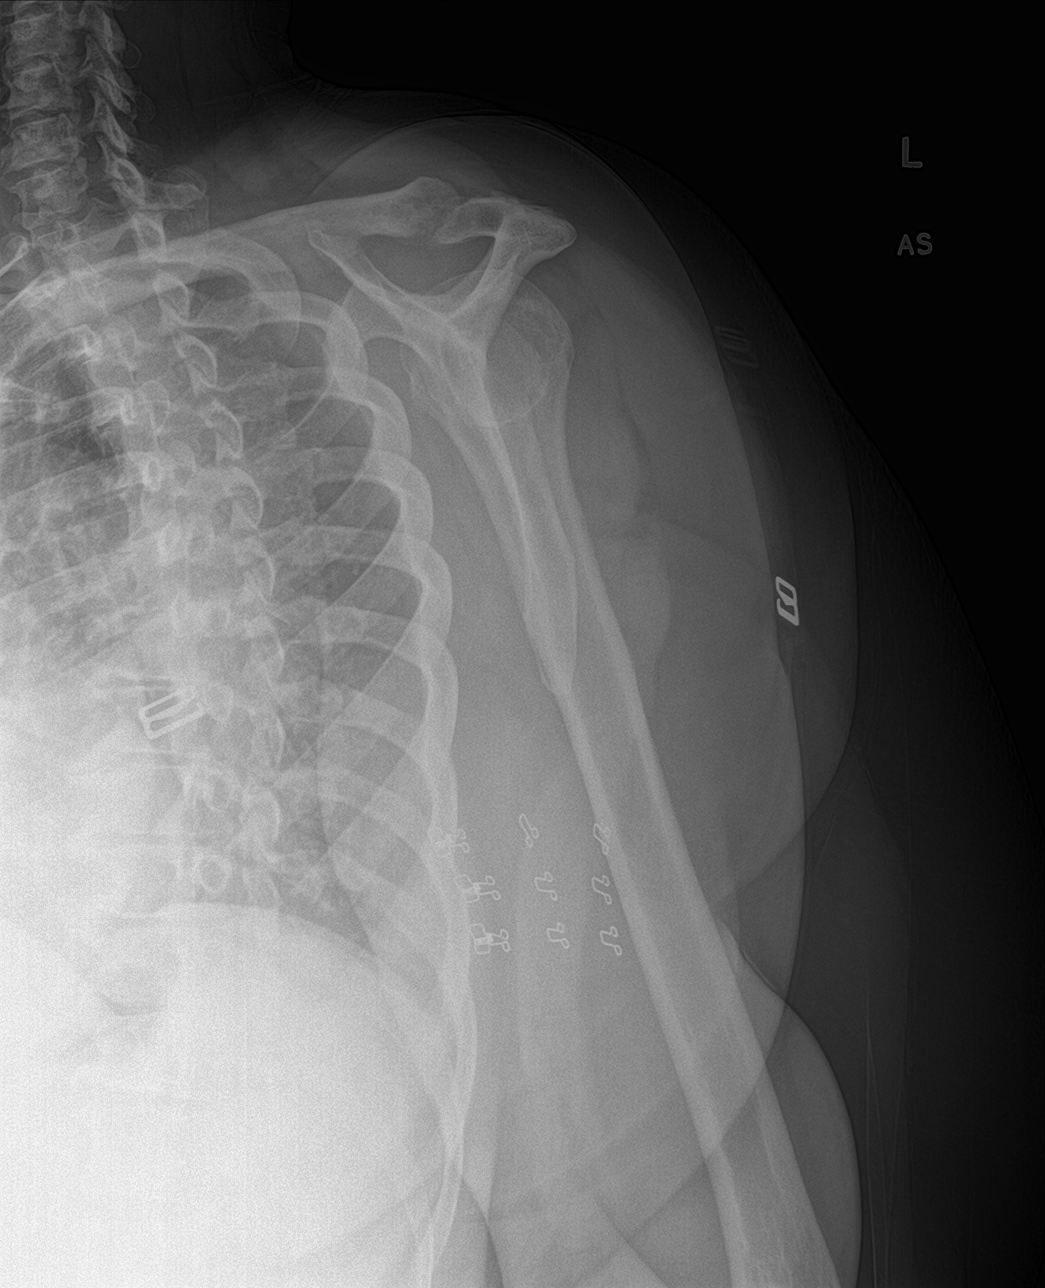

[shoulder axial]
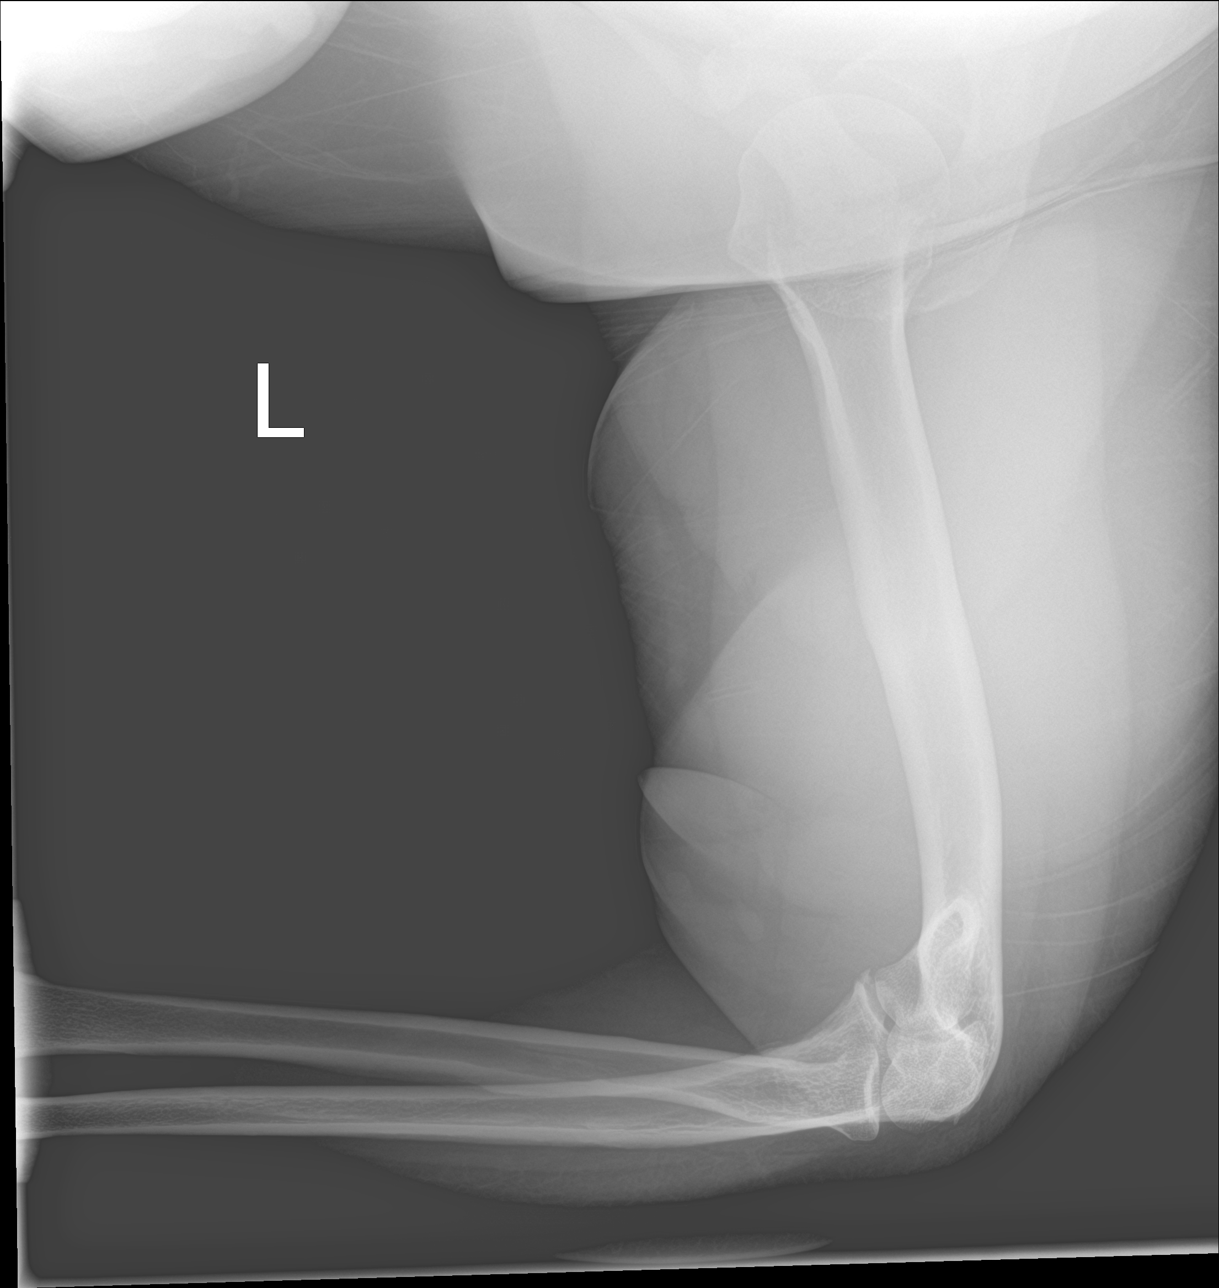

[3 of 3 positions shown; findings below may reference images not displayed]

FINDINGS: There is no evidence of fracture or dislocation. There is no
evidence of significant arthropathy or other focal bone abnormality.
Soft tissues are unremarkable.
IMPRESSION: No acute osseous abnormality.

## 2021-07-28 IMAGING — DX DG CERVICAL SPINE COMPLETE 4+V
5 series · 5 of 5 positions shown · non-contrast
Comparison: None Available.

CLINICAL DATA: Neck and shoulder pain

EXAM:
CERVICAL SPINE - COMPLETE 4+ VIEW

[c-spine lat]
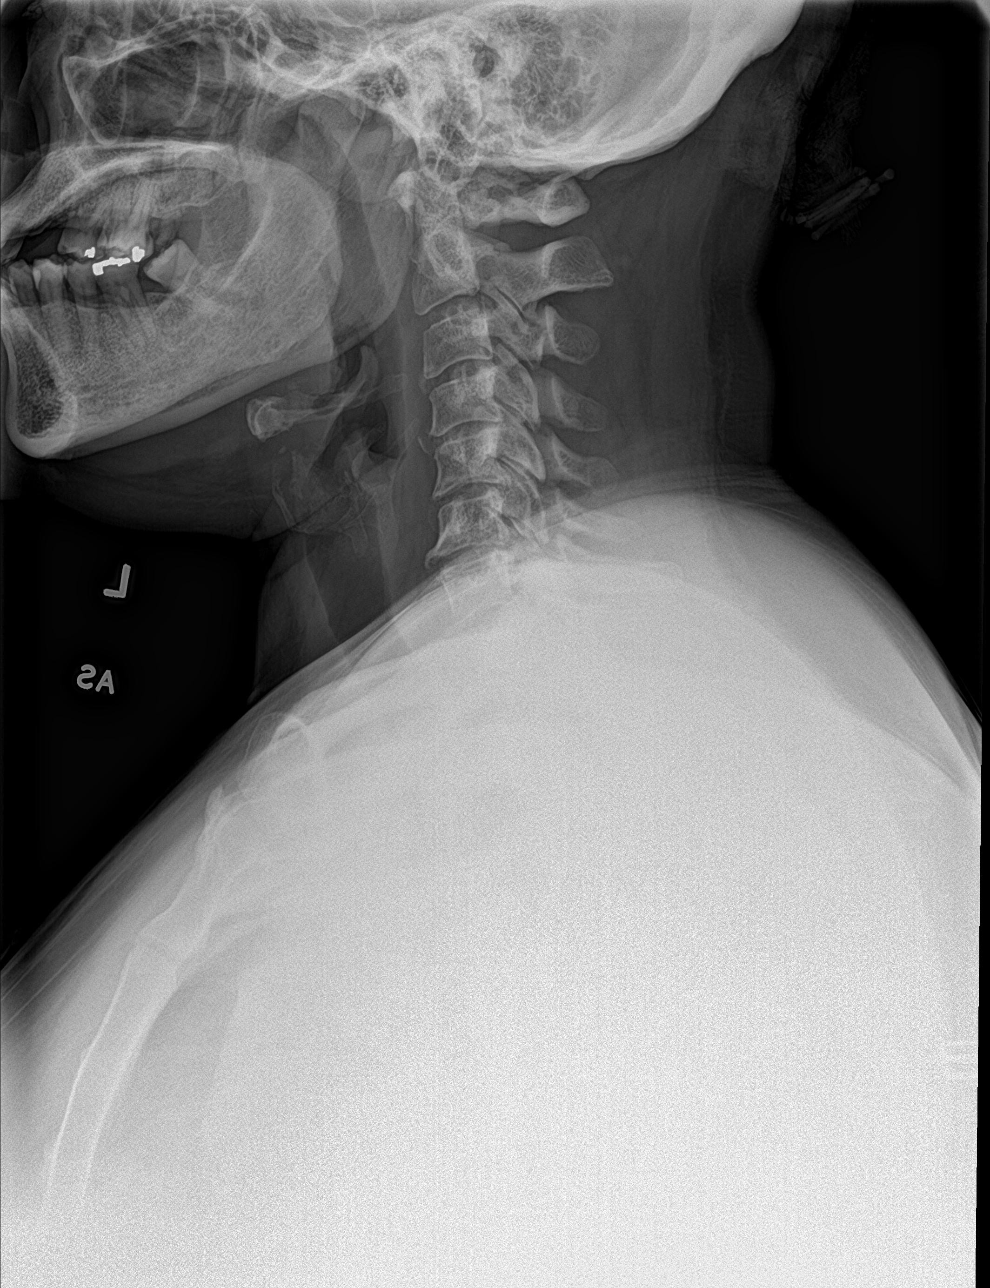

[c-spine obl (1 of 2)]
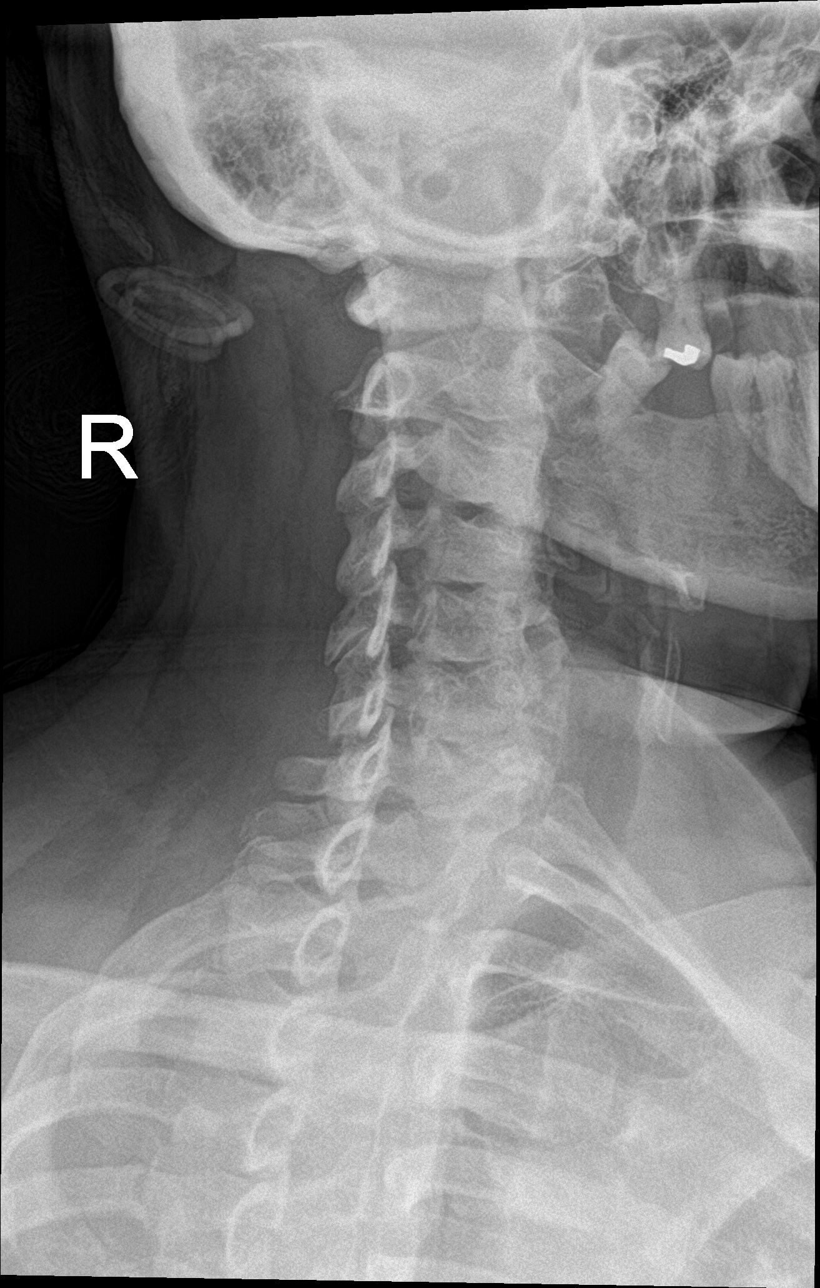

[c-spine obl (2 of 2)]
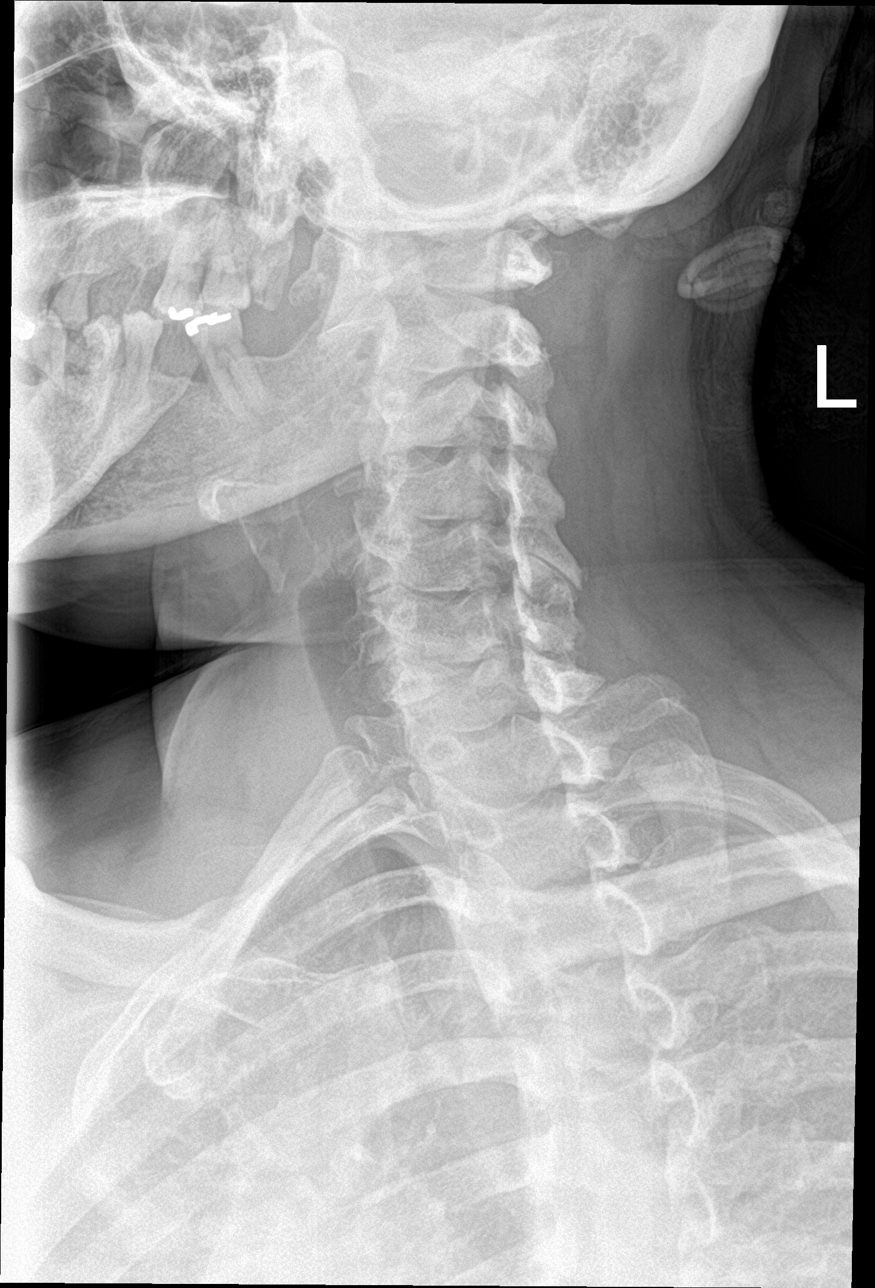

[c-spine ap]
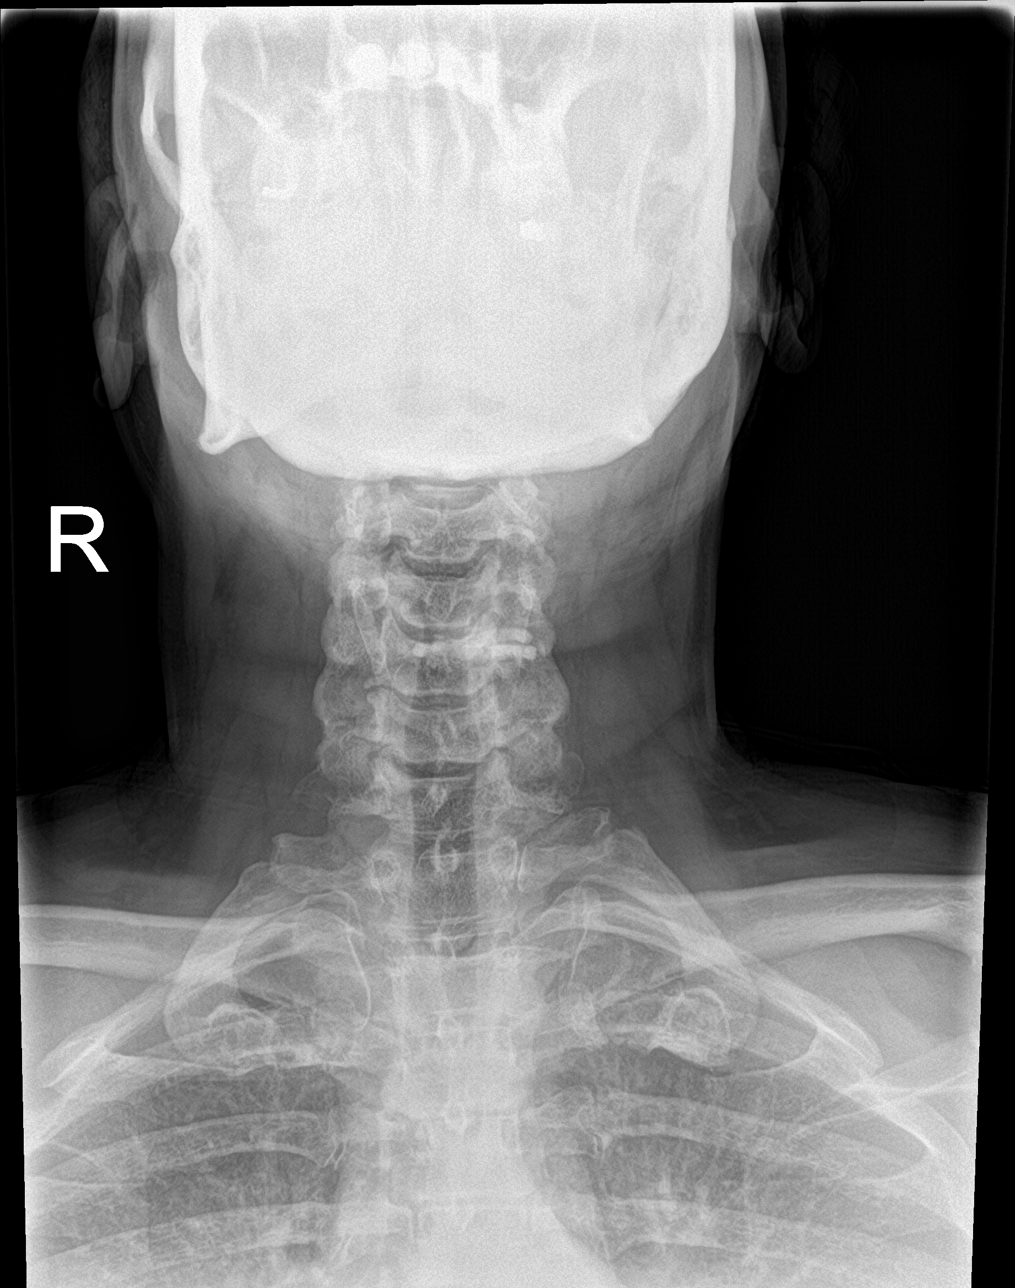

[c-spine open mouth]
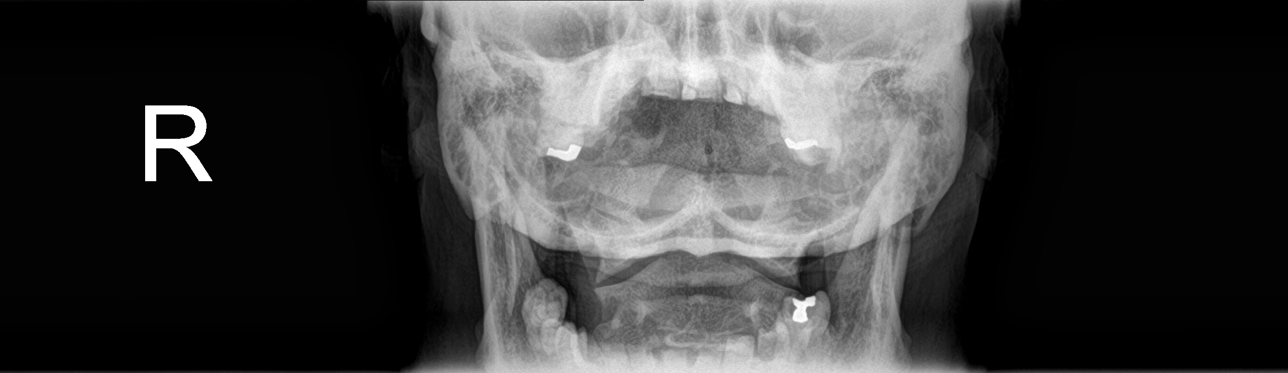

[5 of 5 positions shown; findings below may reference images not displayed]

FINDINGS: Cervical spine is visualized to the level of T1.

Vertebral body heights are maintained. No static listhesis. Loss of
the normal cervical lordosis with straightening. Prevertebral soft
tissues are normal. No acute fracture.

Degenerative disease with mild disc height loss at C4-5, C5-6 and
C6-7. Bilateral uncovertebral degenerative changes at C4-5 and C5-6
with foraminal encroachment.
IMPRESSION: 1. Mild cervical spine spondylosis as described above.
2. No acute osseous injury of the cervical spine.

## 2021-07-28 MED ORDER — METHYLPREDNISOLONE 4 MG PO TBPK
ORAL_TABLET | ORAL | 0 refills | Status: DC
Start: 2021-07-28 — End: 2021-09-25
  Filled 2021-07-28: qty 21, 6d supply, fill #0

## 2021-07-28 NOTE — Discharge Instructions (Signed)
You were seen today for neck and arm pain.  I believe this is likely from a pinched nerve.  Your shoulder xray was normal.  The cervical spine xray did show degenerative changes (arthritis).   I have sent out a medication to help with your pain.  I recommend you follow up with your primary care provider for further discussion and testing regarding your symptoms.

## 2021-07-28 NOTE — ED Provider Notes (Signed)
[facility]    CSN: [id number] Arrival date & time: 07/28/21  1010      History   Chief Complaint Chief Complaint  Patient presents with   Numbness    HPI Jenna [name] is a 51 y.o. female.   Patient is here for left shoulder and arm numbness.  Started in feb as a tingling sensation.  She did see her pcp, discussed an xray of the shoulder but has not had this done yet.  She is having worsening numbness, tingling and pain in her shoulder, neck, down the arm.  She gets numbness and tingling into the fingers at times.   It gets really intense;   She is using biofreeze without help.  She is on a muscle relaxer without help.   Past Medical History:  Diagnosis Date   Arthritis    Back pain    Chest pain    Depression    Edema of both lower extremities    High blood pressure    Joint pain    Osteoarthritis     Patient Active Problem List   Diagnosis Date Noted   Positive FIT (fecal immunochemical test) 07/15/2021   Periodic limb movement 07/11/2021   Loud snoring 01/23/2021   Severe obesity (BMI >= 40) ([facility]) 02/08/2020   Insulin resistance 07/28/2019   Depression 07/28/2019   Vitamin D deficiency 12/09/2018   Class 3 severe obesity with serious comorbidity and body mass index (BMI) of 40.0 to 44.9 in adult ([facility]) 01/17/2017   Low back pain 05/17/2016   Unilateral primary osteoarthritis, left knee 05/08/2016   Primary osteoarthritis of both knees 06/27/2015   Closed right fibular fracture 08/10/2014    Past Surgical History:  Procedure Laterality Date   CHOLECYSTECTOMY     TUBAL LIGATION      OB History   No obstetric history on file.      Home Medications    Prior to Admission medications   Medication Sig Start Date End Date Taking? Authorizing Provider  diclofenac Sodium (VOLTAREN) 1 % GEL Apply 2 g topically 4 (four) times daily. Patient not taking: Reported on 07/11/2021 01/23/21   Marcine MatarJohnson, Deborah B, MD  DULoxetine (CYMBALTA) 60 MG capsule  Take one capsule (60mg ) once per day for back pain and mood 10/06/20   Anders SimmondsMcClung, Angela M, PA-C  ferrous sulfate 324 MG TBEC Take 1 tablet (324 mg total) by mouth every other day. 03/01/21   Marcine MatarJohnson, Deborah B, MD  fluticasone (FLONASE) 50 MCG/ACT nasal spray Place 2 sprays into both nostrils daily. 10/06/20   Anders SimmondsMcClung, Angela M, PA-C  gabapentin (NEURONTIN) 300 MG capsule Take 1 capsule (300 mg total) by mouth 3 (three) times daily as needed. 02/09/21   Kathryne HitchBlackman, Christopher Y, MD  hydrOXYzine (ATARAX) 25 MG tablet TAKE 1 TO 2 TABLETS BY MOUTH EVERY 6 TO 8 HOURS AS NEEDED FOR ITCHING 04/13/21 04/13/22  Georganna SkeansWilson, Amelia, MD  methocarbamol (ROBAXIN) 500 MG tablet Take 1 tablet (500 mg total) by mouth 3 (three) times daily. As needed for muscle spasm 10/06/20   Georgian CoMcClung, Angela M, PA-C  metroNIDAZOLE (FLAGYL) 500 MG tablet Take 1 tablet (500 mg total) by mouth 2 (two) times daily. 07/12/21   Marcine MatarJohnson, Deborah B, MD  naproxen (NAPROSYN) 500 MG tablet Take 1 tablet (500 mg total) by mouth 2 (two) times daily with a meal as needed for pain 10/06/20   Georgian CoMcClung, Angela M, PA-C  rOPINIRole (REQUIP) 0.5 MG tablet Take 1 tablet (0.5 mg total) by  mouth at bedtime. 07/11/21   Marcine Matar, MD  tiZANidine (ZANAFLEX) 4 MG tablet Take 1 tablet (4 mg total) by mouth every 8 (eight) hours as needed for muscle spasms. Patient not taking: Reported on 07/11/2021 04/13/21   Kathryne Hitch, MD  Vitamin D, Ergocalciferol, (DRISDOL) 1.25 MG (50000 UNIT) CAPS capsule Take 1 capsule (50,000 Units total) by mouth every 7 (seven) days. Patient not taking: Reported on 07/11/2021 10/13/20   Anders Simmonds, PA-C    Family History Family History  Problem Relation Age of Onset   Diabetes Mother    Hypertension Mother     Social History Social History   Tobacco Use   Smoking status: Every Day    Packs/day: 0.10    Types: Cigarettes   Smokeless tobacco: Never  Vaping Use   Vaping Use: Never used  Substance Use Topics   Alcohol  use: No    Alcohol/week: 0.0 standard drinks   Drug use: No     Allergies   Diflucan [fluconazole]   Review of Systems Review of Systems  Constitutional: Negative.   HENT: Negative.    Respiratory: Negative.    Gastrointestinal: Negative.   Genitourinary: Negative.   Musculoskeletal: Negative.   Neurological:  Positive for numbness.    Physical Exam Triage Vital Signs ED Triage Vitals  Enc Vitals Group     BP 07/28/21 1027 (!) 151/98     Pulse Rate 07/28/21 1026 (!) 105     Resp 07/28/21 1026 17     Temp 07/28/21 1026 98.1 F (36.7 C)     Temp Source 07/28/21 1026 Oral     SpO2 07/28/21 1026 95 %     Weight 07/28/21 1024 (!) 314 lb (142.4 kg)     Height 07/28/21 1024 5\' 5"  (1.651 m)     Head Circumference --      Peak Flow --      Pain Score 07/28/21 1023 6     Pain Loc --      Pain Edu? --      Excl. in GC? --    No data found.  Updated Vital Signs BP (!) 151/98   Pulse (!) 105   Temp 98.1 F (36.7 C) (Oral)   Resp 17   Ht 5\' 5"  (1.651 m)   Wt (!) 142.4 kg   SpO2 95%   BMI 52.25 kg/m   Visual Acuity Right Eye Distance:   Left Eye Distance:   Bilateral Distance:    Right Eye Near:   Left Eye Near:    Bilateral Near:     Physical Exam Cardiovascular:     Rate and Rhythm: Normal rate.  Pulmonary:     Effort: Pulmonary effort is normal.  Musculoskeletal:     Comments: TTP to the cervical spine, left neck, and left shoulder;  decreased rom at the neck due to pain.  Pain with movement of the left shoulder;  Normal hand grip strength;  Spurlings + on the left  Skin:    General: Skin is warm.  Neurological:     General: No focal deficit present.     Mental Status: She is alert.  Psychiatric:        Mood and Affect: Mood normal.     UC Treatments / Results  Labs (all labs ordered are listed, but only abnormal results are displayed) Labs Reviewed - No data to display  EKG   Radiology DG Cervical Spine Complete  Result Date:  07/28/2021 CLINICAL DATA:  Neck and shoulder pain EXAM: CERVICAL SPINE - COMPLETE 4+ VIEW COMPARISON:  None Available. FINDINGS: Cervical spine is visualized to the level of T1. Vertebral body heights are maintained. No static listhesis. Loss of the normal cervical lordosis with straightening. Prevertebral soft tissues are normal. No acute fracture. Degenerative disease with mild disc height loss at C4-5, C5-6 and C6-7. Bilateral uncovertebral degenerative changes at C4-5 and C5-6 with foraminal encroachment. IMPRESSION: 1. Mild cervical spine spondylosis as described above. 2. No acute osseous injury of the cervical spine. Electronically Signed   By: Elige Ko M.D.   On: 07/28/2021 12:50   DG Shoulder Left  Result Date: 07/28/2021 CLINICAL DATA:  Shoulder pain since January. EXAM: LEFT SHOULDER - 2+ VIEW COMPARISON:  No recent relevant priors available for comparison dictation. FINDINGS: There is no evidence of fracture or dislocation. There is no evidence of significant arthropathy or other focal bone abnormality. Soft tissues are unremarkable. IMPRESSION: No acute osseous abnormality. Electronically Signed   By: Maudry Mayhew M.D.   On: 07/28/2021 12:49    Procedures Procedures (including critical care time)  Medications Ordered in UC Medications - No data to display  Initial Impression / Assessment and Plan / UC Course  I have reviewed the triage vital signs and the nursing notes.  Pertinent labs & imaging results that were available during my care of the patient were reviewed by me and considered in my medical decision making (see chart for details).    Final Clinical Impressions(s) / UC Diagnoses   Final diagnoses:  Neck pain  Pain in joint of left shoulder  Cervical radiculopathy  Numbness and tingling in left arm     Discharge Instructions      You were seen today for neck and arm pain.  I believe this is likely from a pinched nerve.  Your shoulder xray was normal.  The  cervical spine xray did show degenerative changes (arthritis).   I have sent out a medication to help with your pain.  I recommend you follow up with your primary care provider for further discussion and testing regarding your symptoms.     ED Prescriptions     Medication Sig Dispense Auth. Provider   methylPREDNISolone (MEDROL DOSEPAK) 4 MG TBPK tablet Take as directed 1 each Jannifer Franklin, MD      PDMP not reviewed this encounter.   Jannifer Franklin, MD 07/28/21 1258

## 2021-07-28 NOTE — ED Triage Notes (Signed)
Pt reports numbness that began in shoulder since January. states the numbness now goes from the back of neck down to left arm since April. States presented to UC due to the pain and throbbing sensation getting worse making it difficult to sleep. Tried using biofreeze and muscle relaxer with no relief.

## 2021-08-03 ENCOUNTER — Inpatient Hospital Stay: Admission: RE | Admit: 2021-08-03 | Payer: Self-pay | Source: Ambulatory Visit

## 2021-08-03 ENCOUNTER — Ambulatory Visit: Payer: Self-pay | Attending: Internal Medicine | Admitting: Internal Medicine

## 2021-08-03 ENCOUNTER — Encounter: Payer: Self-pay | Admitting: Internal Medicine

## 2021-08-03 VITALS — BP 128/83 | HR 99 | Ht 65.0 in | Wt 318.0 lb

## 2021-08-03 DIAGNOSIS — M5412 Radiculopathy, cervical region: Secondary | ICD-10-CM

## 2021-08-03 NOTE — Patient Instructions (Signed)
We have ordered an MRI of your neck.  Further management will be based on results.  In the meantime I would use the Naprosyn and a muscle relaxant as needed.  Warm compresses or heating pad to the neck as needed is also recommended.

## 2021-08-03 NOTE — Progress Notes (Signed)
Patient ID: Jenna [name], female    DOB: 02-23-1971  MRN: [id number]  CC: Neck Pain   Subjective: Jenna [name] is a 51 y.o. female who presents for UC visit Her concerns today include:  Patient with history of morbid obesity, preDM, OA of the knees, chronic lower back pain.  Depression (on Cymbalta for LBP and mood), vitamin D deficiency  Patient presents today as a follow-up from urgent care visit.  She was seen 07/18/2021 with complaints of neck and left shoulder pain. Patient tells me that this started around the latter part of last year and has gotten progressively worse. Pain is at the back of the neck and radiates to the left shoulder and down the left arm.  It is associated with a feeling of tightness in the muscle over the left shoulder and into the arm.  It is associated with tingling in the arm and fingers.  She does feel that the left hand is weak at times Worse with movement of the neck and shoulder. Denies any initiating factors. She has been using Biofreeze and muscle relaxant without much improvement.  She also sleeps with a pillow under the left shoulder but this does not seem to help.   Patient Active Problem List   Diagnosis Date Noted   Positive FIT (fecal immunochemical test) 07/15/2021   Periodic limb movement 07/11/2021   Loud snoring 01/23/2021   Severe obesity (BMI >= 40) (HCC) 02/08/2020   Insulin resistance 07/28/2019   Depression 07/28/2019   Vitamin D deficiency 12/09/2018   Class 3 severe obesity with serious comorbidity and body mass index (BMI) of 40.0 to 44.9 in adult (HCC) 01/17/2017   Low back pain 05/17/2016   Unilateral primary osteoarthritis, left knee 05/08/2016   Primary osteoarthritis of both knees 06/27/2015   Closed right fibular fracture 08/10/2014     Current Outpatient Medications on File Prior to Visit  Medication Sig Dispense Refill   DULoxetine (CYMBALTA) 60 MG capsule Take one capsule (60mg ) once per day for back pain and mood  90 capsule 0   ferrous sulfate 324 MG TBEC Take 1 tablet (324 mg total) by mouth every other day. 30 tablet 1   fluticasone (FLONASE) 50 MCG/ACT nasal spray Place 2 sprays into both nostrils daily. 16 g 6   gabapentin (NEURONTIN) 300 MG capsule Take 1 capsule (300 mg total) by mouth 3 (three) times daily as needed. 60 capsule 1   hydrOXYzine (ATARAX) 25 MG tablet TAKE 1 TO 2 TABLETS BY MOUTH EVERY 6 TO 8 HOURS AS NEEDED FOR ITCHING 30 tablet 0   methocarbamol (ROBAXIN) 500 MG tablet Take 1 tablet (500 mg total) by mouth 3 (three) times daily. As needed for muscle spasm 90 tablet 1   methylPREDNISolone (MEDROL DOSEPAK) 4 MG TBPK tablet Take as directed 21 each 0   metroNIDAZOLE (FLAGYL) 500 MG tablet Take 1 tablet (500 mg total) by mouth 2 (two) times daily. 14 tablet 0   naproxen (NAPROSYN) 500 MG tablet Take 1 tablet (500 mg total) by mouth 2 (two) times daily with a meal as needed for pain 60 tablet 1   rOPINIRole (REQUIP) 0.5 MG tablet Take 1 tablet (0.5 mg total) by mouth at bedtime. 30 tablet 1   tiZANidine (ZANAFLEX) 4 MG tablet Take 1 tablet (4 mg total) by mouth every 8 (eight) hours as needed for muscle spasms. 60 tablet 0   Vitamin D, Ergocalciferol, (DRISDOL) 1.25 MG (50000 UNIT) CAPS capsule Take 1 capsule (50,000  Units total) by mouth every 7 (seven) days. 16 capsule 0   No current facility-administered medications on file prior to visit.    Allergies  Allergen Reactions   Diflucan [Fluconazole] Hives    Social History   Socioeconomic History   Marital status: Single    Spouse name: Not on file   Number of children: Not on file   Years of education: Not on file   Highest education level: Not on file  Occupational History   Occupation: Stay at home  Tobacco Use   Smoking status: Every Day    Packs/day: 0.10    Types: Cigarettes   Smokeless tobacco: Never  Vaping Use   Vaping Use: Never used  Substance and Sexual Activity   Alcohol use: No    Alcohol/week: 0.0  standard drinks   Drug use: No   Sexual activity: Not on file  Other Topics Concern   Not on file  Social History Narrative   Not on file   Social Determinants of Health   Financial Resource Strain: Not on file  Food Insecurity: Not on file  Transportation Needs: Not on file  Physical Activity: Not on file  Stress: Not on file  Social Connections: Not on file  Intimate Partner Violence: Not on file    Family History  Problem Relation Age of Onset   Diabetes Mother    Hypertension Mother     Past Surgical History:  Procedure Laterality Date   CHOLECYSTECTOMY     TUBAL LIGATION      ROS: Review of Systems Negative except as stated above  PHYSICAL EXAM: BP 128/83   Pulse 99   Ht 5\' 5"  (1.651 m)   Wt (!) 318 lb (144.2 kg)   SpO2 100%   BMI 52.92 kg/m   Physical Exam  General appearance - alert, well appearing, middle-aged African-American female and in no distress Mental status - normal mood, behavior, speech, dress, motor activity, and thought processes Neurological -grip is decreased on the left side.  Power in the right upper extremity 5/5 proximally and distally.  Power in the left upper extremity 3-4/5 as patient gives to pain. Musculoskeletal -patient with tenderness on palpation of the cervical spine.  Mild to moderate tenderness on palpation over the left trapezius muscle.  She has moderate discomfort with attempted passive range of motion of the left shoulder.      Latest Ref Rng & Units 01/23/2021    3:25 PM 10/06/2020    2:58 PM 09/25/2019    3:48 PM  CMP  Glucose 70 - 99 mg/dL 161103   096123   045105    BUN 6 - 24 mg/dL 7   10   8     Creatinine 0.57 - 1.00 mg/dL 4.090.91   8.110.80   9.140.89    Sodium 134 - 144 mmol/L 138   140   140    Potassium 3.5 - 5.2 mmol/L 4.2   4.1   3.6    Chloride 96 - 106 mmol/L 102   105   108    CO2 20 - 29 mmol/L 21   22   26     Calcium 8.7 - 10.2 mg/dL 9.2   9.4   8.9    Total Protein 6.0 - 8.5 g/dL  6.8     Total Bilirubin 0.0 -  1.2 mg/dL  0.2     Alkaline Phos 44 - 121 IU/L  78     AST 0 - 40 IU/L  15     ALT 0 - 32 IU/L  15      Lipid Panel     Component Value Date/Time   CHOL 163 10/06/2020 1458   TRIG 181 (H) 10/06/2020 1458   HDL 43 10/06/2020 1458   CHOLHDL 3.8 10/06/2020 1458   LDLCALC 89 10/06/2020 1458    CBC    Component Value Date/Time   WBC 8.0 01/23/2021 1525   WBC 8.9 09/25/2019 1548   RBC 4.24 01/23/2021 1525   RBC 4.06 09/25/2019 1548   HGB 12.5 01/23/2021 1525   HCT 37.4 01/23/2021 1525   PLT 284 01/23/2021 1525   MCV 88 01/23/2021 1525   MCH 29.5 01/23/2021 1525   MCH 29.3 09/25/2019 1548   MCHC 33.4 01/23/2021 1525   MCHC 31.9 09/25/2019 1548   RDW 15.1 01/23/2021 1525   LYMPHSABS 2.7 10/06/2020 1458   EOSABS 0.3 10/06/2020 1458   BASOSABS 0.0 10/06/2020 1458    ASSESSMENT AND PLAN: 1. Cervical radiculopathy I recommend an MRI of the cervical spine to evaluate for slipped disks, spinal stenosis etc.  Further management will be based on results.  In the meantime I advised that she takes Naprosyn with Zanaflex as needed.  Recommend heating pad to the neck several times a day. - MR Cervical Spine Wo Contrast; Future    Patient was given the opportunity to ask questions.  Patient verbalized understanding of the plan and was able to repeat key elements of the plan.   This documentation was completed using Paediatric nurse.  Any transcriptional errors are unintentional.  No orders of the defined types were placed in this encounter.    Requested Prescriptions    No prescriptions requested or ordered in this encounter    No follow-ups on file.  Jonah Blue, MD, FACP

## 2021-08-11 ENCOUNTER — Ambulatory Visit: Payer: Self-pay | Admitting: Pharmacist

## 2021-08-21 ENCOUNTER — Other Ambulatory Visit: Payer: Self-pay

## 2021-08-21 ENCOUNTER — Ambulatory Visit (HOSPITAL_COMMUNITY)
Admission: RE | Admit: 2021-08-21 | Discharge: 2021-08-21 | Disposition: A | Discharge status: Home / Self Care | Payer: Self-pay | Source: Ambulatory Visit | Attending: Internal Medicine | Admitting: Internal Medicine

## 2021-08-21 ENCOUNTER — Other Ambulatory Visit: Payer: Self-pay | Admitting: Physician Assistant

## 2021-08-21 DIAGNOSIS — M545 Low back pain, unspecified: Secondary | ICD-10-CM

## 2021-08-21 DIAGNOSIS — M5412 Radiculopathy, cervical region: Secondary | ICD-10-CM

## 2021-08-21 IMAGING — MR MR CERVICAL SPINE W/O CM
4 of 6 series · 17 of 48 positions shown · non-contrast
Comparison: Radiograph from [DATE].

CLINICAL DATA: Initial evaluation for cervical radiculopathy.

EXAM:
MRI CERVICAL SPINE WITHOUT CONTRAST
TECHNIQUE: Multiplanar, multisequence MR imaging of the cervical spine was
performed. No intravenous contrast was administered.

[Series 2: T2 · sagittal · 3.0mm · 0.31mm/px · 3 of 18 slices shown (1 of 2)]
[im 1/18]
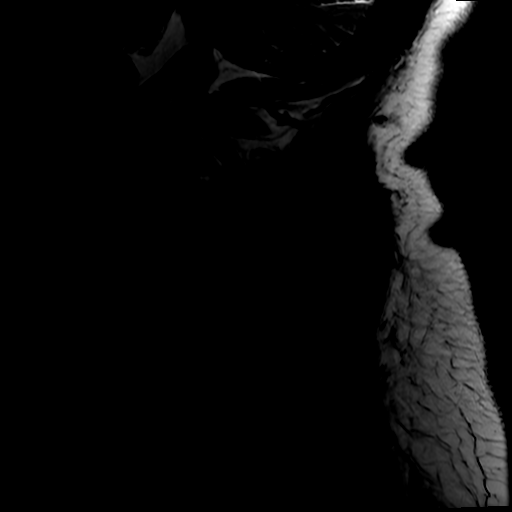
[im 9/18]
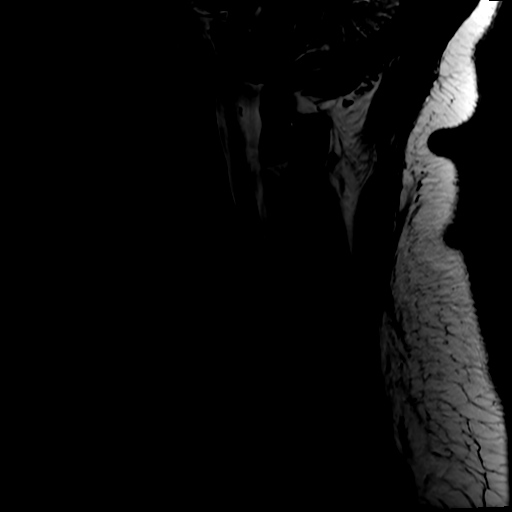
[im 18/18]
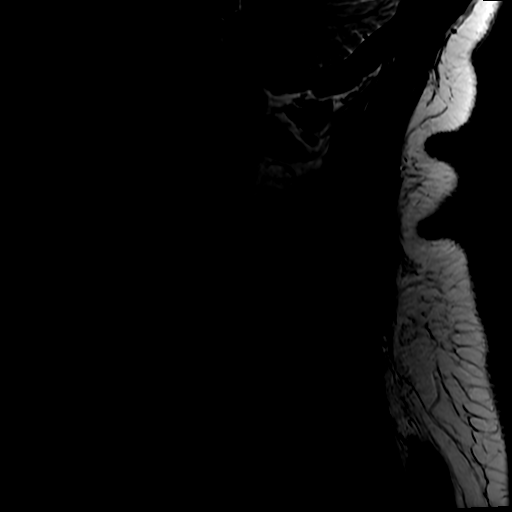

[Series 4: STIR · sagittal · 3.0mm · 0.31mm/px · 3 of 20 slices shown]
[im 1/20]
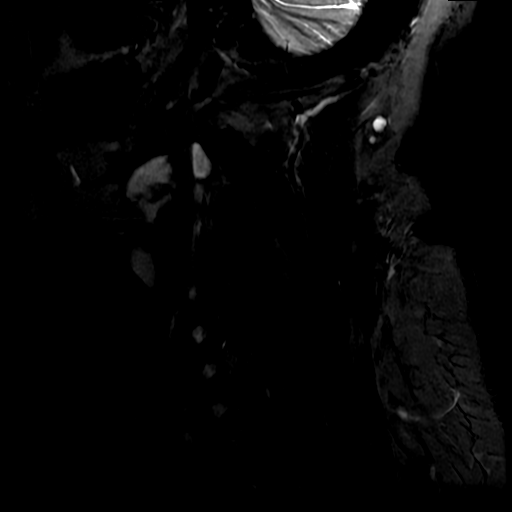
[im 13/20]
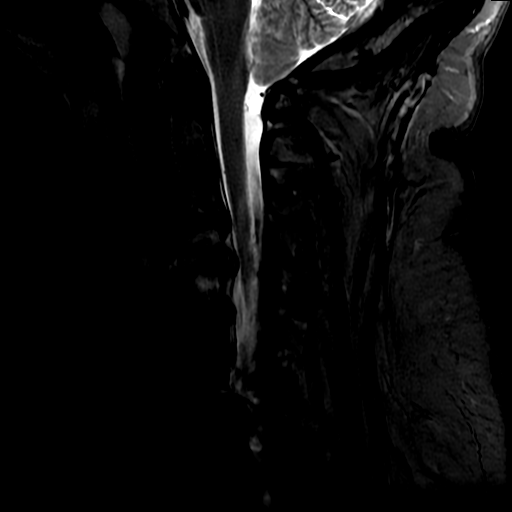
[im 20/20]
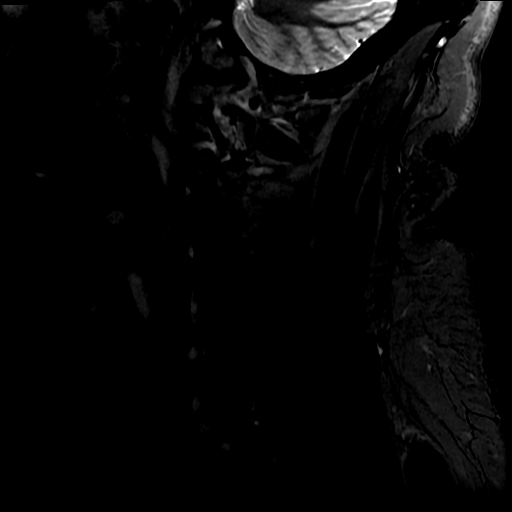

[Series 6: T2 · axial · 3.0mm · 0.35mm/px · z∈[-58,+57]mm · 8 of 37 slices shown (2 of 2)]
[im 1/37]
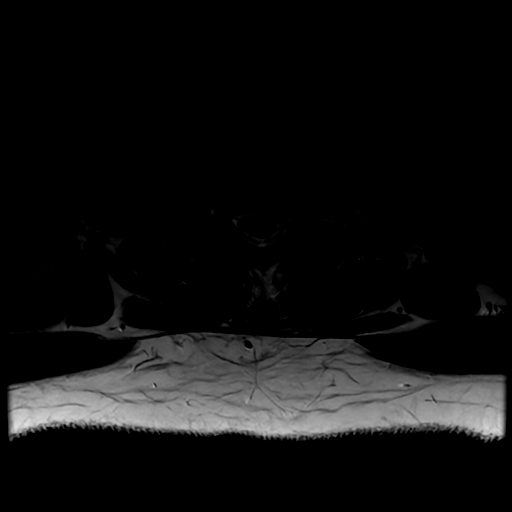
[im 6/37]
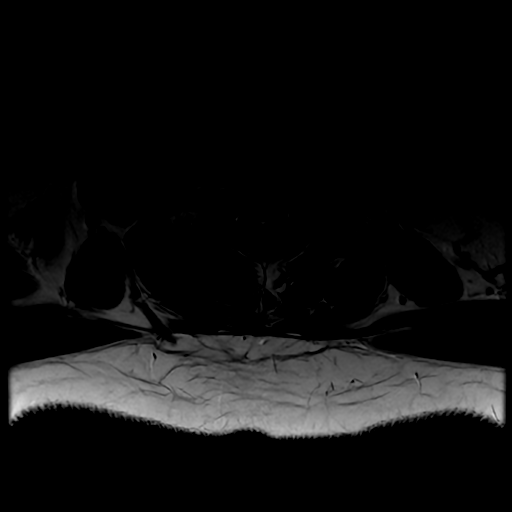
[im 11/37]
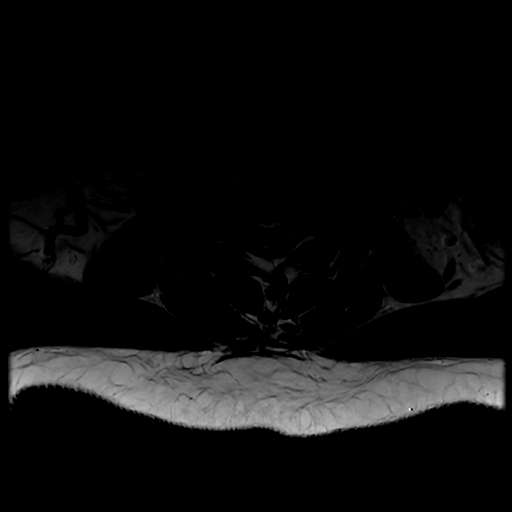
[im 16/37]
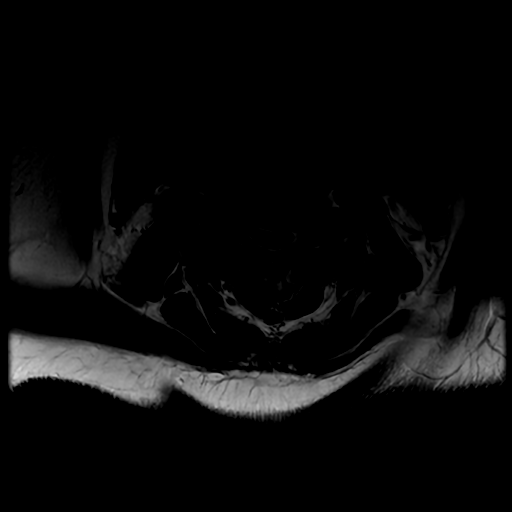
[im 21/37]
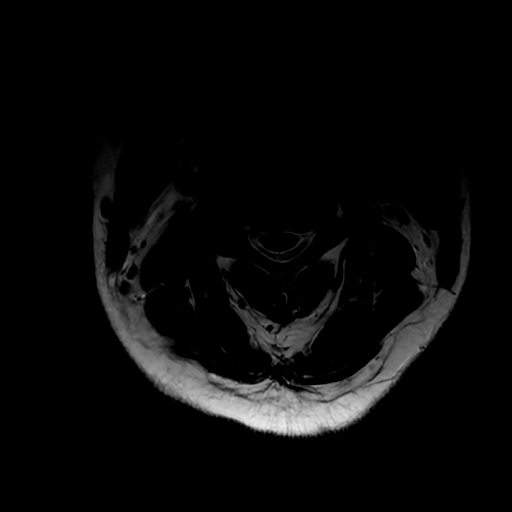
[im 26/37]
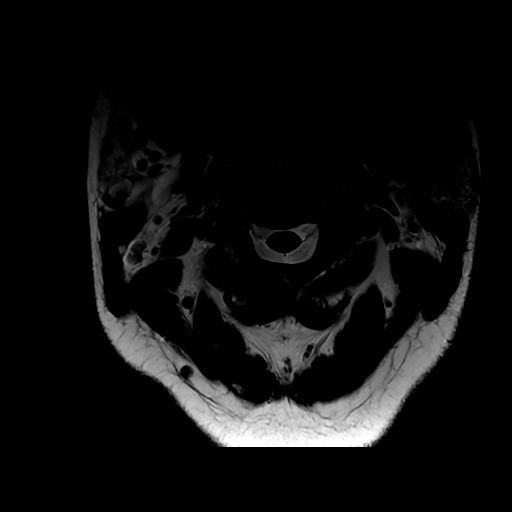
[im 31/37]
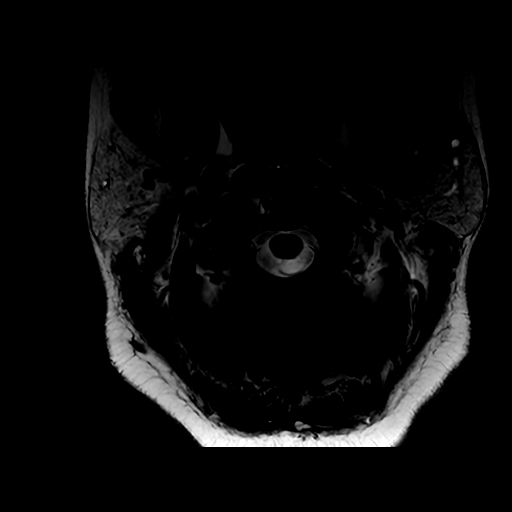
[im 37/37]
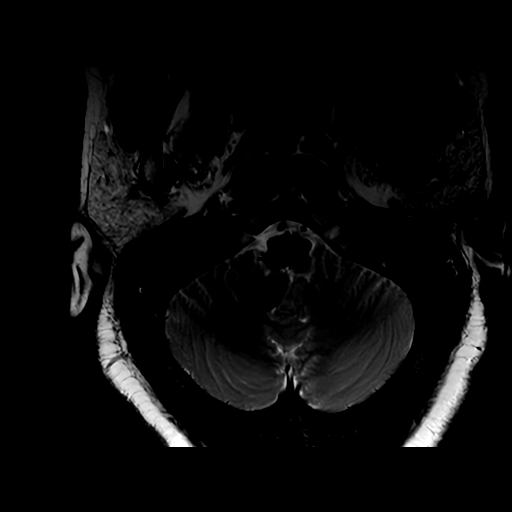

[Series 7: T1 · axial · non-contrast · 3.0mm · 0.35mm/px · z∈[-42,+38]mm · 3 of 37 slices shown]
[im 6/37]
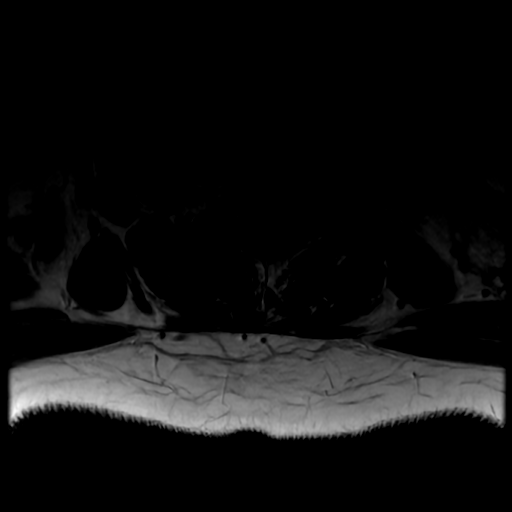
[im 21/37]
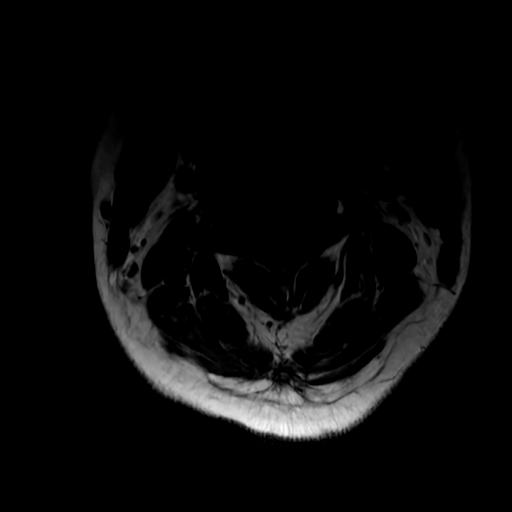
[im 31/37]
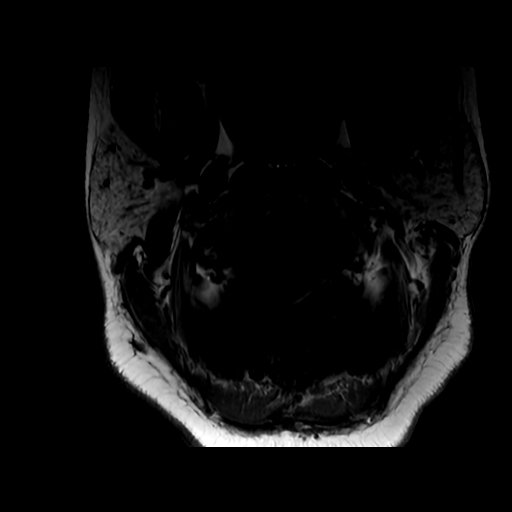

[17 of 48 positions shown; findings below may reference images not displayed]

FINDINGS: Alignment: Straightening with mild reversal of the normal cervical
lordosis. Trace facet mediated anterolisthesis of C7 on T1.

Vertebrae: Vertebral body height maintained without acute or chronic
fracture. Bone marrow signal intensity within normal limits. No
worrisome osseous lesions. Scattered reactive endplate changes
present about the C3-4 through C6-7 interspaces, most pronounced at
C4-5 where there is associated marrow edema.

Cord: Normal signal and morphology.  No syrinx.

Posterior Fossa, vertebral arteries, paraspinal tissues: Cerebellar
tonsils extend up to 9 mm below the foramen magnum, consistent with
Chiari 1 malformation. Paraspinous soft tissues within normal
limits. Normal flow voids seen within the vertebral arteries
bilaterally. 1.2 cm left thyroid nodule noted, of doubtful
significance given size and patient age, no follow-up imaging
recommended (ref: [HOSPITAL]. [DATE]): 143-50).

Disc levels:

C2-C3: Unremarkable.

C3-C4: Broad left paracentral disc osteophyte complex flattens and
partially effaces the ventral thecal sac. Mild spinal stenosis
without frank cord impingement. Left greater than right
uncovertebral spurring with resultant moderate left C4 foraminal
stenosis. Right neural foramen remains patent.

C4-C5: Degenerative intervertebral disc space narrowing with diffuse
disc osteophyte complex, slightly asymmetric to the left. Broad
posterior component flattens and effaces the ventral thecal sac.
Mild spinal stenosis with mild cord flattening, but no cord signal
changes. Severe left with moderate right C5 foraminal stenosis.

C5-C6: Degenerative intervertebral disc space narrowing with diffuse
disc osteophyte complex, slightly eccentric to the right. Flattening
and partial effacement of the ventral thecal sac with resultant mild
spinal stenosis. Moderate right with mild left C6 foraminal
narrowing.

C6-C7: Degenerative intervertebral disc space narrowing. Broad-based
central disc protrusion indents the ventral thecal sac. Mild
flattening of the ventral cord without cord signal changes. Mild
spinal stenosis. Superimposed right greater than left uncovertebral
spurring. Resultant mild right C7 foraminal stenosis. Left neural
foramina remains patent.

C7-T1: Trace anterolisthesis with mild diffuse disc bulge. Moderate
left with mild right facet hypertrophy. No significant spinal
stenosis. Mild left C8 foraminal narrowing. Right neural foramen
remains patent.

Visualized upper thoracic spine demonstrates no significant finding.
IMPRESSION: 1. Multilevel cervical spondylosis with resultant mild diffuse
spinal stenosis at C3-4 through C6-7.
2. Multifactorial degenerative changes with resultant multilevel
foraminal narrowing as above. Notable findings include moderate left
C4 foraminal stenosis, severe left with moderate right C5 foraminal
narrowing, with moderate right C6 foraminal stenosis.
3. Chiari 1 malformation.

## 2021-08-22 ENCOUNTER — Other Ambulatory Visit: Payer: Self-pay

## 2021-08-22 MED ORDER — NAPROXEN 500 MG PO TABS
500.0000 mg | ORAL_TABLET | Freq: Two times a day (BID) | ORAL | 1 refills | Status: DC
  Filled 2021-08-22 – 2022-02-13 (×3): qty 60, 30d supply, fill #0
  Filled 2022-05-21 – 2022-05-28 (×2): qty 60, 30d supply, fill #1

## 2021-08-22 NOTE — Telephone Encounter (Signed)
Requested Prescriptions  Pending Prescriptions Disp Refills  . naproxen (NAPROSYN) 500 MG tablet 60 tablet 1    Sig: Take 1 tablet (500 mg total) by mouth 2 (two) times daily with a meal as needed for pain     Analgesics:  NSAIDS Failed - 08/21/2021  5:26 PM      Failed - Manual Review: Labs are only required if the patient has taken medication for more than 8 weeks.      Passed - Cr in normal range and within 360 days    Creatinine, Ser  Date Value Ref Range Status  01/23/2021 0.91 0.57 - 1.00 mg/dL Final         Passed - HGB in normal range and within 360 days    Hemoglobin  Date Value Ref Range Status  01/23/2021 12.5 11.1 - 15.9 g/dL Final         Passed - PLT in normal range and within 360 days    Platelets  Date Value Ref Range Status  01/23/2021 284 150 - 450 x10E3/uL Final         Passed - HCT in normal range and within 360 days    Hematocrit  Date Value Ref Range Status  01/23/2021 37.4 34.0 - 46.6 % Final         Passed - eGFR is 30 or above and within 360 days    GFR calc Af Amer  Date Value Ref Range Status  09/25/2019 >60 >60 mL/min Final   GFR calc non Af Amer  Date Value Ref Range Status  09/25/2019 >60 >60 mL/min Final   eGFR  Date Value Ref Range Status  01/23/2021 77 >59 mL/min/1.73 Final         Passed - Patient is not pregnant      Passed - Valid encounter within last 12 months    Recent Outpatient Visits          2 weeks ago Cervical radiculopathy   [place], Deborah B, MD   1 month ago Pap smear for cervical cancer screening   [place], MD   3 months ago Morbid obesity [facility])   [place] Karle Plumber B, MD   6 months ago Elevated blood-pressure reading without diagnosis of hypertension   [place], Jarome Matin, RPH-CPP   7 months ago Acute neck pain   [place], Deborah B, MD      Future Appointments            In 1 month Ninfa Linden, Lind Guest, MD [place]   In 2 months Ladell Pier, MD [place]

## 2021-08-23 ENCOUNTER — Encounter: Payer: Self-pay | Admitting: Internal Medicine

## 2021-08-23 ENCOUNTER — Telehealth: Payer: Self-pay | Admitting: Internal Medicine

## 2021-08-23 DIAGNOSIS — M5412 Radiculopathy, cervical region: Secondary | ICD-10-CM

## 2021-08-23 DIAGNOSIS — G935 Compression of brain: Secondary | ICD-10-CM

## 2021-08-23 NOTE — Telephone Encounter (Signed)
Phone call placed to patient this morning to go over the results of MRI of the cervical spine.  This revealed multilevel spondylosis with mild diffuse spinal stenosis at levels C3-4 and C6-7.  Also showed degenerative disc multilevel.  Incidental finding of Chiari I malformation.  I explained to her what that is. I recommend referral to orthopedics for further evaluation and management and to neurology.  Patient agreeable with plan.

## 2021-08-29 ENCOUNTER — Other Ambulatory Visit: Payer: Self-pay

## 2021-08-29 ENCOUNTER — Encounter: Payer: Self-pay | Admitting: Internal Medicine

## 2021-08-31 ENCOUNTER — Other Ambulatory Visit: Payer: Self-pay

## 2021-09-06 ENCOUNTER — Other Ambulatory Visit: Payer: Self-pay

## 2021-09-08 ENCOUNTER — Encounter: Payer: Self-pay | Admitting: Internal Medicine

## 2021-09-18 ENCOUNTER — Encounter: Payer: Self-pay | Admitting: Internal Medicine

## 2021-09-25 ENCOUNTER — Other Ambulatory Visit: Payer: Self-pay

## 2021-09-25 ENCOUNTER — Ambulatory Visit (INDEPENDENT_AMBULATORY_CARE_PROVIDER_SITE_OTHER): Payer: Self-pay | Admitting: Psychiatry

## 2021-09-25 ENCOUNTER — Encounter: Payer: Self-pay | Admitting: Psychiatry

## 2021-09-25 ENCOUNTER — Telehealth: Payer: Self-pay | Admitting: Psychiatry

## 2021-09-25 VITALS — BP 148/82 | HR 94 | Ht 65.0 in | Wt 318.0 lb

## 2021-09-25 DIAGNOSIS — R51 Headache with orthostatic component, not elsewhere classified: Secondary | ICD-10-CM

## 2021-09-25 DIAGNOSIS — R9089 Other abnormal findings on diagnostic imaging of central nervous system: Secondary | ICD-10-CM

## 2021-09-25 DIAGNOSIS — H538 Other visual disturbances: Secondary | ICD-10-CM

## 2021-09-25 DIAGNOSIS — G935 Compression of brain: Secondary | ICD-10-CM

## 2021-09-25 MED ORDER — TOPIRAMATE 25 MG PO TABS
ORAL_TABLET | ORAL | 6 refills | Status: DC
  Filled 2021-09-25: qty 120, 30d supply, fill #0
  Filled 2021-11-20 – 2021-11-28 (×2): qty 120, 30d supply, fill #1
  Filled 2022-02-13: qty 120, 30d supply, fill #2
  Filled 2022-05-21: qty 120, 30d supply, fill #3
  Filled 2022-06-21: qty 120, 30d supply, fill #4
  Filled 2022-08-07 – 2022-09-19 (×2): qty 120, 30d supply, fill #5

## 2021-09-25 NOTE — Patient Instructions (Signed)
Start Topamax for headache prevention. Take 25 mg (1 pill) at bedtime for one week, then increase to 50 mg (2 pills) at bedtime for one week, then take 75 mg (3 pills) at bedtime for one week, then take 100 mg (4 pills) at bedtime and continue on this dose  MRI of the brain  Referral to Ophthalmology

## 2021-09-25 NOTE — Telephone Encounter (Signed)
self-pay sent to GI  

## 2021-09-25 NOTE — Telephone Encounter (Signed)
Referral for Ophthalmology sent to [facility] [phone]. 

## 2021-09-25 NOTE — Progress Notes (Signed)
[facility]  PATIENT: Jenna [name] DOB: Sep 06, 1970  REFERRING CLINICIAN: Marcine Matar, MD HISTORY FROM: self REASON FOR VISIT: abnormal MRI, dizziness   HISTORICAL  CHIEF COMPLAINT:  Chief Complaint  Patient presents with   Neurologic Problem    RM 1 alone Pt is well,states she is having occasional imbalance and dizziness when sitting    HISTORY OF PRESENT ILLNESS:  The patient presents for evaluation of Chiari malformation found incidentally on C-spine MRI. She had been experiencing radicular neck pain and paresthesias of the left arm, so MRI C-spine was done on 08/21/21. This revealed multilevel degenerative changes with mild spinal stenosis at C3-4 through C6-7, severe left C4 foraminal stenosis, and moderate right C5 and C6 foraminal stenosis. It also revealed a 9 mm Chiari malformation.  She reports having headaches for several years. She will get intense throbbing occipital headaches every time she laughs, bends forward, coughs, or sneezes. These last for a few seconds to a couple of minutes at a time. This has been present over the past 10 years. She will also have headaches described as bifrontal pressure and throbbing which are associated with phonophobia. They can last 4-5 days at a time. She takes naproxen or Aleve as needed which eases the pain but does resolve it. Reports intermittent migraines in the past.  She also reports persistently blurred vision in both eyes which occurs with and without headaches. It is not improved by wearing her glasses. Has not been to an eye doctor in over a year and has never seen an ophthalmologist.  She developed intermittent vertigo about 5 months ago. This can occur at any time whether she is lying down or sitting. No clear positional triggers. She also has arthritis in her knees and they will occasionally give out from under her which can cause imbalance.   OTHER MEDICAL CONDITIONS: osteoarthritis, cervical  radiculopathy, RLS, depression   REVIEW OF SYSTEMS: Full 14 system review of systems performed and negative with exception of: headaches, vertigo, blurred vision  ALLERGIES: Allergies  Allergen Reactions   Diflucan [Fluconazole] Hives    HOME MEDICATIONS: Outpatient Medications Prior to Visit  Medication Sig Dispense Refill   DULoxetine (CYMBALTA) 60 MG capsule Take 1 capsule (60mg ) by mouth once per day for back pain and mood. 90 capsule 0   ferrous sulfate 324 MG TBEC Take 1 tablet (324 mg total) by mouth every other day. 30 tablet 1   fluticasone (FLONASE) 50 MCG/ACT nasal spray Place 2 sprays into both nostrils daily. 16 g 6   gabapentin (NEURONTIN) 300 MG capsule Take 1 capsule (300 mg total) by mouth 3 (three) times daily as needed. 60 capsule 1   hydrOXYzine (ATARAX) 25 MG tablet TAKE 1 TO 2 TABLETS BY MOUTH EVERY 6 TO 8 HOURS AS NEEDED FOR ITCHING 30 tablet 0   metroNIDAZOLE (FLAGYL) 500 MG tablet Take 1 tablet (500 mg total) by mouth 2 (two) times daily. 14 tablet 0   naproxen (NAPROSYN) 500 MG tablet Take 1 tablet (500 mg total) by mouth 2 (two) times daily with a meal as needed for pain. 60 tablet 1   rOPINIRole (REQUIP) 0.5 MG tablet Take 1 tablet (0.5 mg total) by mouth at bedtime. 30 tablet 1   tiZANidine (ZANAFLEX) 4 MG tablet Take 1 tablet (4 mg total) by mouth every 8 (eight) hours as needed for muscle spasms. 60 tablet 0   Vitamin D, Ergocalciferol, (DRISDOL) 1.25 MG (50000 UNIT) CAPS capsule Take 1 capsule (50,000  Units total) by mouth every 7 (seven) days. 16 capsule 0   methylPREDNISolone (MEDROL DOSEPAK) 4 MG TBPK tablet Take as directed (Patient not taking: Reported on 09/25/2021) 21 each 0   No facility-administered medications prior to visit.    PAST MEDICAL HISTORY: Past Medical History:  Diagnosis Date   Arthritis    Back pain    Chest pain    Depression    Edema of both lower extremities    High blood pressure    Joint pain    Osteoarthritis      PAST SURGICAL HISTORY: Past Surgical History:  Procedure Laterality Date   CHOLECYSTECTOMY     TUBAL LIGATION      FAMILY HISTORY: Family History  Problem Relation Age of Onset   Diabetes Mother    Hypertension Mother     SOCIAL HISTORY: Social History   Socioeconomic History   Marital status: Single    Spouse name: Not on file   Number of children: Not on file   Years of education: Not on file   Highest education level: Not on file  Occupational History   Occupation: Stay at home  Tobacco Use   Smoking status: Every Day    Packs/day: 0.10    Types: Cigarettes   Smokeless tobacco: Never  Vaping Use   Vaping Use: Never used  Substance and Sexual Activity   Alcohol use: No    Alcohol/week: 0.0 standard drinks of alcohol   Drug use: No   Sexual activity: Not on file  Other Topics Concern   Not on file  Social History Narrative   Not on file   Social Determinants of Health   Financial Resource Strain: Not on file  Food Insecurity: Not on file  Transportation Needs: Not on file  Physical Activity: Not on file  Stress: Not on file  Social Connections: Not on file  Intimate Partner Violence: Not on file     PHYSICAL EXAM  GENERAL EXAM/CONSTITUTIONAL: Vitals:  Vitals:   09/25/21 1009  BP: (!) 148/82  Pulse: 94  Weight: (!) 318 lb (144.2 kg)  Height: 5\' 5"  (1.651 m)   Body mass index is 52.92 kg/m. Wt Readings from Last 3 Encounters:  09/25/21 (!) 318 lb (144.2 kg)  08/03/21 (!) 318 lb (144.2 kg)  07/28/21 (!) 314 lb (142.4 kg)    NEUROLOGIC: MENTAL STATUS:  awake, alert, oriented to person, place and time recent and remote memory intact normal attention and concentration  CRANIAL NERVE:  2nd - no papilledema visualized 2nd, 3rd, 4th, 6th - pupils equal and reactive to light, visual fields full to confrontation, extraocular muscles intact, no nystagmus 5th - facial sensation symmetric 7th - facial strength symmetric 8th - hearing  intact 9th - palate elevates symmetrically, uvula midline 11th - shoulder shrug symmetric 12th - tongue protrusion midline  MOTOR:  normal bulk and tone, full strength in the BUE, BLE  SENSORY:  normal and symmetric to light touch all 4 extremities  COORDINATION:  finger-nose-finger intact bilaterally  REFLEXES:  deep tendon reflexes present and symmetric  GAIT/STATION:  normal  Dix-Hallpike negative   DIAGNOSTIC DATA (LABS, IMAGING, TESTING) - I reviewed patient records, labs, notes, testing and imaging myself where available.  Lab Results  Component Value Date   WBC 8.0 01/23/2021   HGB 12.5 01/23/2021   HCT 37.4 01/23/2021   MCV 88 01/23/2021   PLT 284 01/23/2021      Component Value Date/Time   NA 138 01/23/2021 1525  K 4.2 01/23/2021 1525   CL 102 01/23/2021 1525   CO2 21 01/23/2021 1525   GLUCOSE 103 (H) 01/23/2021 1525   GLUCOSE 105 (H) 09/25/2019 1548   BUN 7 01/23/2021 1525   CREATININE 0.91 01/23/2021 1525   CALCIUM 9.2 01/23/2021 1525   PROT 6.8 10/06/2020 1458   ALBUMIN 4.1 10/06/2020 1458   AST 15 10/06/2020 1458   ALT 15 10/06/2020 1458   ALKPHOS 78 10/06/2020 1458   BILITOT 0.2 10/06/2020 1458   GFRNONAA >60 09/25/2019 1548   GFRAA >60 09/25/2019 1548   Lab Results  Component Value Date   CHOL 163 10/06/2020   HDL 43 10/06/2020   LDLCALC 89 10/06/2020   TRIG 181 (H) 10/06/2020   CHOLHDL 3.8 10/06/2020   Lab Results  Component Value Date   HGBA1C 5.8 05/23/2021   Lab Results  Component Value Date   VITAMINB12 454 04/02/2019   Lab Results  Component Value Date   TSH 1.430 01/23/2021    MRI C-spine 08/21/21: 1. Multilevel cervical spondylosis with resultant mild diffuse spinal stenosis at C3-4 through C6-7. 2. Multifactorial degenerative changes with resultant multilevel foraminal narrowing as above. Notable findings include moderate left C4 foraminal stenosis, severe left with moderate right C5 foraminal narrowing, with  moderate right C6 foraminal stenosis. 3. Chiari 1 malformation.   ASSESSMENT AND PLAN  51 y.o. year old female with a history of osteoarthritis, cervical radiculopathy, RLS, depression who presents for evaluation of Chiari malformation seen on MRI C-spine. Will order MRI brain for better characterization of her Chiari malformation. She does report occipital headaches with valsalva which are consistent with Chiari headaches. Has intermittent migraines as well. Will start Topamax for headache prevention. Referral to ophthalmology placed for a formal eye exam to assess for papilledema or ocular cause of persistent blurred vision.  1. Positional headache   2. Abnormal CT of brain   3. Blurred vision       PLAN: -MRI brain -Start Topamax 25 mg once daily; increase dose by 25 mg each week as tolerated -- up to 100 mg/day -Referral to Ophthalmology for formal eye exam   Orders Placed This Encounter  Procedures   MR BRAIN W WO CONTRAST   Ambulatory referral to Ophthalmology    Meds ordered this encounter  Medications   topiramate (TOPAMAX) 25 MG tablet    Sig: Take 25 mg (1 pill) at bedtime for one week, then increase to 50 mg (2 pills) at bedtime for one week, then take 75 mg (3 pills) at bedtime for one week, then take 100 mg (4 pills) at bedtime and continue on this dose    Dispense:  120 tablet    Refill:  6    Return in about 6 months (around 03/28/2022).    Ocie Doyne, MD 09/25/21 11:00 AM  I spent an average of 40 minutes chart reviewing and counseling the patient, with at least 50% of the time face to face with the patient.   [facility] [address] [place], [place] [address] [phone]

## 2021-09-27 ENCOUNTER — Encounter: Payer: Self-pay | Admitting: Psychiatry

## 2021-09-27 ENCOUNTER — Encounter: Payer: Self-pay | Attending: Internal Medicine | Admitting: Registered"

## 2021-09-27 ENCOUNTER — Other Ambulatory Visit: Payer: Self-pay

## 2021-09-27 DIAGNOSIS — Z713 Dietary counseling and surveillance: Secondary | ICD-10-CM | POA: Insufficient documentation

## 2021-09-27 NOTE — Progress Notes (Signed)
Medical Nutrition Therapy  Appointment Start time:  11:15  Appointment End time:  12:26  Primary concerns today: none stated  Referral diagnosis: obesity Preferred learning style: no preference indicated Learning readiness:  (not ready, contemplating, ready, change in progress)   NUTRITION ASSESSMENT   States she sees a therapist once/week. Therapist is Amy Stowers [facility]).   States she is taking multiple pain medications for knees, shoulders, back, headaches, and neck. States she has some vertigo as well.   States she needs to get her weight off and nothing is working. States it is making arthritis worse in her knees as they are bone-on-bone.   States she is on a spiritual fast of 12 hours (12 am-12 pm), no food, no water. States the fast started 7/18 and will last until 7/28. States she is trying to follow high protein, low carb diet to get the weight off. States she eats a variety of vegetables.   Lives with partner and partner's son (aged 11 years old). States she does not work. Reports challenges with physical activity because she hurts so much.    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) Medications: See list Labs: elevated A1c (6.0) Notable Signs/Symptoms: none reported  Lifestyle & Dietary Hx  Estimated daily fluid intake: 90-100 oz Supplements: See list Sleep: 5-7 hours/night Stress / self-care: listens to [employer], avoids people, reading, cleaning, word games, puzzles Current average weekly physical activity: strength training 3-4 times/week  24-Hr Dietary Recall First Meal: skipped due to current fast; 4 slices of [place] bacon + 2 eggs + coffee + water Snack:  Second Meal (12:30 pm): typically skips;  jim[employer] dean[employer]- sausage, egg, cheese croissant + coffee Snack:  Third Meal (6:30 pm): bowl (grilled chicken, rice, broccoli, cheese)  Snack (10:15 pm): 2 handfuls of Fritos + Sprite  (4 oz) Beverages: coffee (2-3*10 oz; 20-30 oz), soda (6 oz), water (64 oz); 90-100 oz   NUTRITION DIAGNOSIS  NB-1.1 Food and nutrition-related knowledge deficit As related to elevated A1c value.  As evidenced by pt verbalizes incomplete nutrition-related knowledge.   NUTRITION INTERVENTION  Nutrition education (E-1) on the following topics:  Pt was educated and counseled on metabolism, importance of not skipping meals, effects of dieting/restriction, how carbohydrates work in the body, prediabetes/diabetes risk factors, A1c ranges for prediabetes, role of fiber in eating regimen, and importance of physical activity. Discussed meal/snack planning and how to balance meals/snacks using MyPlate for prediabetes. Pt agreed with goals listed.  Handouts Provided Include  MyPlate for Prediabetes  Learning Style & Readiness for Change Teaching method utilized: Visual & Auditory  Demonstrated degree of understanding via: Teach Back  Barriers to learning/adherence to lifestyle change: contemplative stage of change  Goals Established by Pt Aim to have 3 meals a day.  Aim to have 1/2 plate of vegetables + 1/4 plate of protein + 1/4 plate of carbohydrates.  Have water with meals.    MONITORING & EVALUATION Dietary intake, weekly physical activity.  Next Steps  Patient is to follow-up prn.

## 2021-09-27 NOTE — Patient Instructions (Signed)
-   Aim to have 3 meals a day.   - Aim to have 1/2 plate of vegetables + 1/4 plate of protein + 1/4 plate of carbohydrates.   - Have water with meals.

## 2021-10-03 ENCOUNTER — Encounter: Payer: Self-pay | Admitting: Orthopaedic Surgery

## 2021-10-03 ENCOUNTER — Encounter: Payer: Self-pay | Admitting: Psychiatry

## 2021-10-03 ENCOUNTER — Ambulatory Visit (INDEPENDENT_AMBULATORY_CARE_PROVIDER_SITE_OTHER): Payer: Self-pay | Admitting: Orthopaedic Surgery

## 2021-10-03 DIAGNOSIS — M4722 Other spondylosis with radiculopathy, cervical region: Secondary | ICD-10-CM

## 2021-10-03 NOTE — Progress Notes (Unsigned)
   Office Visit Note   Patient: Jenna [name]           Date of Birth: 11/18/1970           MRN: [id number] Visit Date: 10/03/2021              Requested by: Marcine Matar, MD [address] [place],  [place] [address] PCP: Marcine Matar, MD   Assessment & Plan: Visit Diagnoses: No diagnosis found.  Plan: ***  Follow-Up Instructions: No follow-ups on file.   Orders:  No orders of the defined types were placed in this encounter.  No orders of the defined types were placed in this encounter.     Procedures: No procedures performed   Clinical Data: No additional findings.   Subjective: Chief Complaint  Patient presents with   Neck - Pain    HPI  Review of Systems   Objective: Vital Signs: BP 117/76   Pulse 97   Ht 5\' 5"  (1.651 m)   Wt (!) 318 lb (144.2 kg)   BMI 52.92 kg/m   Physical Exam  Ortho Exam  Specialty Comments:  No specialty comments available.  Imaging: No results found.   PMFS History: Patient Active Problem List   Diagnosis Date Noted   Positive FIT (fecal immunochemical test) 07/15/2021   Periodic limb movement 07/11/2021   Loud snoring 01/23/2021   Severe obesity (BMI >= 40) (HCC) 02/08/2020   Insulin resistance 07/28/2019   Depression 07/28/2019   Vitamin D deficiency 12/09/2018   Class 3 severe obesity with serious comorbidity and body mass index (BMI) of 40.0 to 44.9 in adult (HCC) 01/17/2017   Low back pain 05/17/2016   Unilateral primary osteoarthritis, left knee 05/08/2016   Primary osteoarthritis of both knees 06/27/2015   Closed right fibular fracture 08/10/2014   Past Medical History:  Diagnosis Date   Arthritis    Back pain    Chest pain    Depression    Edema of both lower extremities    High blood pressure    Joint pain    Osteoarthritis     Family History  Problem Relation Age of Onset   Diabetes Mother    Hypertension Mother     Past Surgical History:  Procedure Laterality Date    CHOLECYSTECTOMY     TUBAL LIGATION     Social History   Occupational History   Occupation: Stay at home  Tobacco Use   Smoking status: Every Day    Packs/day: 0.10    Types: Cigarettes   Smokeless tobacco: Never  Vaping Use   Vaping Use: Never used  Substance and Sexual Activity   Alcohol use: No    Alcohol/week: 0.0 standard drinks of alcohol   Drug use: No   Sexual activity: Not on file

## 2021-10-09 ENCOUNTER — Ambulatory Visit
Admission: RE | Admit: 2021-10-09 | Discharge: 2021-10-09 | Disposition: A | Discharge status: Home / Self Care | Payer: No Typology Code available for payment source | Source: Ambulatory Visit | Attending: Psychiatry | Admitting: Psychiatry

## 2021-10-09 DIAGNOSIS — R51 Headache with orthostatic component, not elsewhere classified: Secondary | ICD-10-CM

## 2021-10-09 DIAGNOSIS — R9089 Other abnormal findings on diagnostic imaging of central nervous system: Secondary | ICD-10-CM

## 2021-10-09 MED ORDER — GADOBENATE DIMEGLUMINE 529 MG/ML IV SOLN
20.0000 mL | Freq: Once | INTRAVENOUS | Status: AC | PRN
  Administered 2021-10-09: 20 mL via INTRAVENOUS

## 2021-10-10 ENCOUNTER — Other Ambulatory Visit: Payer: Self-pay | Admitting: Physician Assistant

## 2021-10-10 ENCOUNTER — Other Ambulatory Visit: Payer: Self-pay

## 2021-10-10 ENCOUNTER — Encounter: Payer: Self-pay | Admitting: Psychiatry

## 2021-10-10 DIAGNOSIS — F32A Depression, unspecified: Secondary | ICD-10-CM

## 2021-10-10 DIAGNOSIS — M545 Low back pain, unspecified: Secondary | ICD-10-CM

## 2021-10-11 ENCOUNTER — Encounter: Payer: Self-pay | Admitting: Orthopaedic Surgery

## 2021-10-11 ENCOUNTER — Other Ambulatory Visit: Payer: Self-pay

## 2021-10-11 ENCOUNTER — Ambulatory Visit (INDEPENDENT_AMBULATORY_CARE_PROVIDER_SITE_OTHER): Payer: No Typology Code available for payment source | Admitting: Orthopaedic Surgery

## 2021-10-11 DIAGNOSIS — M1711 Unilateral primary osteoarthritis, right knee: Secondary | ICD-10-CM

## 2021-10-11 DIAGNOSIS — M1712 Unilateral primary osteoarthritis, left knee: Secondary | ICD-10-CM

## 2021-10-11 MED ORDER — DULOXETINE HCL 60 MG PO CPEP
ORAL_CAPSULE | ORAL | 0 refills | Status: DC
  Filled 2021-10-11 – 2022-02-13 (×2): qty 30, 30d supply, fill #0

## 2021-10-11 NOTE — Progress Notes (Addendum)
The patient is well-known to [place].  She has severe osteoarthritis of her left knee and mild to moderate of the right knee.  She has chronic knee pain.  We have not been able to schedule surgery on her given her high BMI.  Today it is measured at 52.92.  She does not want a steroid injections because of the detrimental effect this can have on weight.  She is not a diabetic.  She has seen a neurologist and she is on other medications to see if this would help from a pain standpoint.  She does walk with assistive device at times to take pressure off of her knees.  Both knees have slight varus malalignment and patellofemoral crepitation with global pain but good range of motion.  She understands that we cannot proceed with any type of surgery until there is significant weight loss due to the difficulty of this surgery combined with the high complication rate with her high BMI.

## 2021-10-16 ENCOUNTER — Other Ambulatory Visit: Payer: Self-pay

## 2021-10-17 ENCOUNTER — Other Ambulatory Visit: Payer: Self-pay

## 2021-10-18 ENCOUNTER — Encounter (INDEPENDENT_AMBULATORY_CARE_PROVIDER_SITE_OTHER): Payer: Self-pay

## 2021-11-08 ENCOUNTER — Encounter: Payer: Self-pay | Admitting: Internal Medicine

## 2021-11-14 ENCOUNTER — Ambulatory Visit: Payer: Self-pay | Admitting: Internal Medicine

## 2021-11-20 ENCOUNTER — Other Ambulatory Visit: Payer: Self-pay | Admitting: Physician Assistant

## 2021-11-20 ENCOUNTER — Other Ambulatory Visit: Payer: Self-pay

## 2021-11-20 ENCOUNTER — Other Ambulatory Visit: Payer: Self-pay | Admitting: Orthopaedic Surgery

## 2021-11-20 DIAGNOSIS — E559 Vitamin D deficiency, unspecified: Secondary | ICD-10-CM

## 2021-11-20 MED ORDER — TIZANIDINE HCL 4 MG PO TABS
4.0000 mg | ORAL_TABLET | Freq: Three times a day (TID) | ORAL | 0 refills | Status: DC | PRN
  Filled 2021-11-20 – 2022-02-13 (×2): qty 60, 20d supply, fill #0

## 2021-11-21 MED ORDER — VITAMIN D (ERGOCALCIFEROL) 1.25 MG (50000 UNIT) PO CAPS
50000.0000 [IU] | ORAL_CAPSULE | ORAL | 1 refills | Status: DC
  Filled 2021-11-21: qty 4, 28d supply, fill #0
  Filled 2022-01-18 – 2022-02-13 (×2): qty 4, 28d supply, fill #1

## 2021-11-22 ENCOUNTER — Other Ambulatory Visit: Payer: Self-pay

## 2021-11-23 ENCOUNTER — Ambulatory Visit
Admission: RE | Admit: 2021-11-23 | Discharge: 2021-11-23 | Disposition: A | Discharge status: Home / Self Care | Payer: No Typology Code available for payment source | Source: Ambulatory Visit | Attending: Internal Medicine | Admitting: Internal Medicine

## 2021-11-23 DIAGNOSIS — Z1231 Encounter for screening mammogram for malignant neoplasm of breast: Secondary | ICD-10-CM

## 2021-11-27 ENCOUNTER — Other Ambulatory Visit: Payer: Self-pay

## 2021-11-28 ENCOUNTER — Other Ambulatory Visit: Payer: Self-pay

## 2021-12-20 ENCOUNTER — Encounter (HOSPITAL_COMMUNITY): Payer: Self-pay

## 2021-12-20 ENCOUNTER — Ambulatory Visit (HOSPITAL_COMMUNITY)
Admission: EM | Admit: 2021-12-20 | Discharge: 2021-12-20 | Disposition: A | Discharge status: Home / Self Care | Payer: Medicare Other | Attending: Physician Assistant | Admitting: Physician Assistant

## 2021-12-20 DIAGNOSIS — R5381 Other malaise: Secondary | ICD-10-CM | POA: Insufficient documentation

## 2021-12-20 DIAGNOSIS — M2550 Pain in unspecified joint: Secondary | ICD-10-CM | POA: Diagnosis not present

## 2021-12-20 DIAGNOSIS — M791 Myalgia, unspecified site: Secondary | ICD-10-CM | POA: Diagnosis not present

## 2021-12-20 DIAGNOSIS — R5383 Other fatigue: Secondary | ICD-10-CM | POA: Diagnosis not present

## 2021-12-20 LAB — POCT URINALYSIS DIPSTICK, ED / UC
Glucose, UA: NEGATIVE mg/dL
Nitrite: NEGATIVE
Protein, ur: 30 mg/dL — AB
Specific Gravity, Urine: >=1.03 (ref 1.005–1.030)
Urobilinogen, UA: 0.2 mg/dL (ref 0.0–1.0)
pH: 5 (ref 5.0–8.0)

## 2021-12-20 LAB — COMPREHENSIVE METABOLIC PANEL
ALT: 24 U/L (ref 0–44)
AST: 28 U/L (ref 15–41)
Albumin: 4.1 g/dL (ref 3.5–5.0)
Alkaline Phosphatase: 72 U/L (ref 38–126)
Anion gap: 10 (ref 5–15)
BUN: 10 mg/dL (ref 6–20)
CO2: 21 mmol/L — ABNORMAL LOW (ref 22–32)
Calcium: 9.8 mg/dL (ref 8.9–10.3)
Chloride: 109 mmol/L (ref 98–111)
Creatinine, Ser: 1.21 mg/dL — ABNORMAL HIGH (ref 0.44–1.00)
GFR, Estimated: 55 mL/min — ABNORMAL LOW (ref >60)
Glucose, Bld: 118 mg/dL — ABNORMAL HIGH (ref 70–99)
Potassium: 4 mmol/L (ref 3.5–5.1)
Sodium: 140 mmol/L (ref 135–145)
Total Bilirubin: 0.5 mg/dL (ref 0.3–1.2)
Total Protein: 7.9 g/dL (ref 6.5–8.1)

## 2021-12-20 LAB — CBC WITH DIFFERENTIAL/PLATELET
Abs Immature Granulocytes: 0.02 10*3/uL (ref 0.00–0.07)
Basophils Absolute: 0.1 10*3/uL (ref 0.0–0.1)
Basophils Relative: 1 %
Eosinophils Absolute: 0.2 10*3/uL (ref 0.0–0.5)
Eosinophils Relative: 3 %
HCT: 38.9 % (ref 36.0–46.0)
Hemoglobin: 12.5 g/dL (ref 12.0–15.0)
Immature Granulocytes: 0 %
Lymphocytes Relative: 46 %
Lymphs Abs: 3.8 10*3/uL (ref 0.7–4.0)
MCH: 27.4 pg (ref 26.0–34.0)
MCHC: 32.1 g/dL (ref 30.0–36.0)
MCV: 85.1 fL (ref 80.0–100.0)
Monocytes Absolute: 0.6 10*3/uL (ref 0.1–1.0)
Monocytes Relative: 7 %
Neutro Abs: 3.5 10*3/uL (ref 1.7–7.7)
Neutrophils Relative %: 43 %
Platelets: 333 10*3/uL (ref 150–400)
RBC: 4.57 MIL/uL (ref 3.87–5.11)
RDW: 14.6 % (ref 11.5–15.5)
WBC: 8.1 10*3/uL (ref 4.0–10.5)
nRBC: 0 % (ref 0.0–0.2)

## 2021-12-20 LAB — POC URINE PREG, ED: Preg Test, Ur: NEGATIVE

## 2021-12-20 LAB — T4, FREE: Free T4: 0.85 ng/dL (ref 0.61–1.12)

## 2021-12-20 LAB — CK: Total CK: 260 U/L — ABNORMAL HIGH (ref 38–234)

## 2021-12-20 LAB — TSH: TSH: 1.655 u[IU]/mL (ref 0.350–4.500)

## 2021-12-20 NOTE — ED Triage Notes (Signed)
Patient having chills, body aches onset Saturday. Patient having loss of appetite, numbness in the hands/arms, nausea, dizzy,fatigue and restlessness.   No one around the Patient sick and no known sick exposure.  No congestion, runny nose, cough, or fever.

## 2021-12-20 NOTE — Discharge Instructions (Addendum)
Your urine looks as though you might be slightly dehydrated and had not been eating and drinking enough.  I do recommend that you push fluids and eat small frequent meals.  We will contact you with your lab work.  Please monitor your MyChart for these results.  Please follow-up with your primary care within the next few days.  If you have any worsening symptoms including shortness of breath, chest pain, weakness, nausea, vomiting interfering with oral intake you need to go to the emergency room immediately.

## 2021-12-20 NOTE — ED Provider Notes (Signed)
[facility]    CSN: [id number] Arrival date & time: 12/20/21  1130      History   Chief Complaint Chief Complaint  Patient presents with   Chills   Generalized Body Aches    HPI Jenna [name] is a 51 y.o. female.   Patient presents today with a 4 to 5-day history of chills, body aches, loss of appetite, fatigue.  She also reports increased numbness and paresthesias in her upper extremities.  She has a history of this related to cervical degenerative disc disease but this has become more frequent.  She also reports feeling very poorly and having difficulty with daily activities.  She denies any medication changes.  Denies any recent illness or additional symptoms including cough, congestion.  Denies any known sick contacts.  She has not had COVID in the past.  Denies history of COVID-19 vaccination.  She denies history of autoimmune condition including rheumatoid arthritis.  She does have osteoarthritis but reports that pain is different distribution and character compared to her typical pain.  She has not been taking any over-the-counter medication for symptom management.  She reports intense fatigue to the point that it is difficult for her to do her daily activities.  She denies any significant blood loss including melena, hematochezia, heavy menstrual cycles.  She does report that she has had some blood on toilet tissue following her menses in the past but not recently.  Her LMP was 12/05/2021.    Past Medical History:  Diagnosis Date   Arthritis    Back pain    Chest pain    Depression    Edema of both lower extremities    High blood pressure    Joint pain    Osteoarthritis     Patient Active Problem List   Diagnosis Date Noted   Other spondylosis with radiculopathy, cervical region 10/03/2021   Positive FIT (fecal immunochemical test) 07/15/2021   Periodic limb movement 07/11/2021   Loud snoring 01/23/2021   Severe obesity (BMI >= 40) ([facility]) 02/08/2020    Insulin resistance 07/28/2019   Depression 07/28/2019   Vitamin D deficiency 12/09/2018   Class 3 severe obesity with serious comorbidity and body mass index (BMI) of 40.0 to 44.9 in adult ([facility]) 01/17/2017   Low back pain 05/17/2016   Unilateral primary osteoarthritis, left knee 05/08/2016   Primary osteoarthritis of both knees 06/27/2015   Closed right fibular fracture 08/10/2014    Past Surgical History:  Procedure Laterality Date   CHOLECYSTECTOMY     TUBAL LIGATION      OB History   No obstetric history on file.      Home Medications    Prior to Admission medications   Medication Sig Start Date End Date Taking? Authorizing Provider  DULoxetine (CYMBALTA) 60 MG capsule Take 1 capsule (60mg ) by mouth once per day for back pain and mood. 10/11/21  Yes Ladell Pier, MD  ferrous sulfate 324 MG TBEC Take 1 tablet (324 mg total) by mouth every other day. 03/01/21  Yes Ladell Pier, MD  gabapentin (NEURONTIN) 300 MG capsule Take 1 capsule (300 mg total) by mouth 3 (three) times daily as needed. 02/09/21  Yes Mcarthur Rossetti, MD  hydrOXYzine (ATARAX) 25 MG tablet TAKE 1 TO 2 TABLETS BY MOUTH EVERY 6 TO 8 HOURS AS NEEDED FOR ITCHING 04/13/21 04/13/22 Yes Dorna Mai, MD  naproxen (NAPROSYN) 500 MG tablet Take 1 tablet (500 mg total) by mouth 2 (two) times daily with  a meal as needed for pain. 08/22/21  Yes Marcine Matar, MD  rOPINIRole (REQUIP) 0.5 MG tablet Take 1 tablet (0.5 mg total) by mouth at bedtime. 07/11/21  Yes Marcine Matar, MD  topiramate (TOPAMAX) 25 MG tablet Take 25 mg (1 pill) at bedtime for one week, then increase to 50 mg (2 pills) at bedtime for one week, then take 75 mg (3 pills) at bedtime for one week, then take 100 mg (4 pills) at bedtime and continue on this dose 09/25/21  Yes Chima, Victorino Dike, MD  Vitamin D, Ergocalciferol, (DRISDOL) 1.25 MG (50000 UNIT) CAPS capsule Take 1 capsule (50,000 Units total) by mouth every 7 (seven) days. 11/21/21   Yes Marcine Matar, MD  fluticasone (FLONASE) 50 MCG/ACT nasal spray Place 2 sprays into both nostrils daily. 10/06/20   Anders Simmonds, PA-C  metroNIDAZOLE (FLAGYL) 500 MG tablet Take 1 tablet (500 mg total) by mouth 2 (two) times daily. 07/12/21   Marcine Matar, MD  tiZANidine (ZANAFLEX) 4 MG tablet Take 1 tablet (4 mg total) by mouth every 8 (eight) hours as needed for muscle spasms. 11/20/21   Kathryne Hitch, MD    Family History Family History  Problem Relation Age of Onset   Diabetes Mother    Hypertension Mother    Breast cancer Neg Hx     Social History Social History   Tobacco Use   Smoking status: Every Day    Packs/day: 0.10    Types: Cigarettes   Smokeless tobacco: Never  Vaping Use   Vaping Use: Never used  Substance Use Topics   Alcohol use: No    Alcohol/week: 0.0 standard drinks of alcohol   Drug use: No     Allergies   Diflucan [fluconazole]   Review of Systems Review of Systems  Constitutional:  Positive for activity change, appetite change, chills and fatigue. Negative for fever.  HENT:  Negative for congestion, sinus pressure, sneezing and sore throat.   Respiratory:  Negative for cough and shortness of breath.   Cardiovascular:  Negative for chest pain.  Gastrointestinal:  Negative for abdominal pain, diarrhea, nausea and vomiting.  Musculoskeletal:  Positive for arthralgias and myalgias.  Neurological:  Negative for dizziness (Chronic), weakness, light-headedness, numbness and headaches.     Physical Exam Triage Vital Signs ED Triage Vitals  Enc Vitals Group     BP 12/20/21 1304 128/87     Pulse Rate 12/20/21 1304 (!) 106     Resp 12/20/21 1304 16     Temp 12/20/21 1304 98.6 F (37 C)     Temp Source 12/20/21 1304 Oral     SpO2 12/20/21 1304 97 %     Weight --      Height --      Head Circumference --      Peak Flow --      Pain Score 12/20/21 1308 5     Pain Loc --      Pain Edu? --      Excl. in GC? --    No  data found.  Updated Vital Signs BP 128/87 (BP Location: Right Arm)   Pulse (!) 106   Temp 98.6 F (37 C) (Oral)   Resp 16   LMP 12/05/2021 (Exact Date)   SpO2 97%   Visual Acuity Right Eye Distance:   Left Eye Distance:   Bilateral Distance:    Right Eye Near:   Left Eye Near:    Bilateral Near:  Physical Exam Vitals reviewed.  Constitutional:      General: She is awake. She is not in acute distress.    Appearance: Normal appearance. She is well-developed. She is not ill-appearing.     Comments: Very pleasant female appears stated age in no acute distress sitting comfortably in exam room  HENT:     Head: Normocephalic and atraumatic. No raccoon eyes, Battle's sign or contusion.     Right Ear: Tympanic membrane, ear canal and external ear normal. No hemotympanum.     Left Ear: Tympanic membrane, ear canal and external ear normal. No hemotympanum.     Nose: Nose normal.     Mouth/Throat:     Tongue: Tongue does not deviate from midline.     Pharynx: Uvula midline. No oropharyngeal exudate or posterior oropharyngeal erythema.  Eyes:     Extraocular Movements: Extraocular movements intact.     Pupils: Pupils are equal, round, and reactive to light.  Cardiovascular:     Rate and Rhythm: Normal rate and regular rhythm.     Heart sounds: Normal heart sounds, S1 normal and S2 normal. No murmur heard. Pulmonary:     Effort: Pulmonary effort is normal.     Breath sounds: Normal breath sounds. No wheezing, rhonchi or rales.     Comments: Clear to auscultation bilaterally Abdominal:     General: Bowel sounds are normal.     Palpations: Abdomen is soft.     Tenderness: There is no abdominal tenderness.  Musculoskeletal:     Cervical back: No spinous process tenderness or muscular tenderness.     Comments: Strength 5/5 bilateral upper and lower extremities.  Normal pincer grip strength.  Lymphadenopathy:     Head:     Right side of head: No submental, submandibular or  tonsillar adenopathy.     Left side of head: No submental, submandibular or tonsillar adenopathy.  Neurological:     General: No focal deficit present.     Mental Status: She is alert and oriented to person, place, and time.     Cranial Nerves: Cranial nerves 2-12 are intact.     Motor: Motor function is intact.     Coordination: Coordination is intact.     Gait: Gait is intact.     Comments: Cranial nerves II through XII grossly intact.  No focal neurological defect on exam.  Psychiatric:        Behavior: Behavior is cooperative.      UC Treatments / Results  Labs (all labs ordered are listed, but only abnormal results are displayed) Labs Reviewed  POCT URINALYSIS DIPSTICK, ED / UC - Abnormal; Notable for the following components:      Result Value   Bilirubin Urine SMALL (*)    Ketones, ur TRACE (*)    Hgb urine dipstick TRACE (*)    Protein, ur 30 (*)    Leukocytes,Ua SMALL (*)    All other components within normal limits  URINE CULTURE  CBC WITH DIFFERENTIAL/PLATELET  COMPREHENSIVE METABOLIC PANEL  CK  TSH  T4, FREE  POC URINE PREG, ED  CERVICOVAGINAL ANCILLARY ONLY    EKG   Radiology No results found.  Procedures Procedures (including critical care time)  Medications Ordered in UC Medications - No data to display  Initial Impression / Assessment and Plan / UC Course  I have reviewed the triage vital signs and the nursing notes.  Pertinent labs & imaging results that were available during my care of the patient were  reviewed by me and considered in my medical decision making (see chart for details).     No indication for emergent evaluation or imaging.  UA was obtained that showed decreased oral intake and dehydration without evidence of infection.  We will send this off for culture as she did have some leukocytes but defer antibiotics until results are available.  She was encouraged to rest and drink plenty of fluid as well as eat small frequent meals.   Patient did request STI testing so a swab was collected at the end of visit-results pending.  We will contact her if need to arrange any treatment based on these results.  Basic labs including CBC, CMP, CK, thyroid studies were obtained today-results pending.  We will contact her if these are abnormal.  She will monitor her results in MyChart.  Recommended that she follow-up with her primary care provider within the next few days for reevaluation and further testing.  She was provided a work excuse note to allow time to arrange follow-up.  Discussed that if anything changes or worsens that she needs to go to the emergency room immediately including headache, dizziness, nausea, vomiting, decreased oral intake, weakness.  Strict return precautions given.  Final Clinical Impressions(s) / UC Diagnoses   Final diagnoses:  Arthralgia, unspecified joint  Myalgia  Malaise and fatigue     Discharge Instructions      Your urine looks as though you might be slightly dehydrated and had not been eating and drinking enough.  I do recommend that you push fluids and eat small frequent meals.  We will contact you with your lab work.  Please monitor your MyChart for these results.  Please follow-up with your primary care within the next few days.  If you have any worsening symptoms including shortness of breath, chest pain, weakness, nausea, vomiting interfering with oral intake you need to go to the emergency room immediately.     ED Prescriptions   None    PDMP not reviewed this encounter.   Jeani Hawking, PA-C 12/20/21 1450

## 2021-12-21 ENCOUNTER — Other Ambulatory Visit: Payer: Self-pay

## 2021-12-21 ENCOUNTER — Telehealth (HOSPITAL_COMMUNITY): Payer: Self-pay | Admitting: Emergency Medicine

## 2021-12-21 LAB — CERVICOVAGINAL ANCILLARY ONLY
Bacterial Vaginitis (gardnerella): POSITIVE — AB
Candida Glabrata: NEGATIVE
Candida Vaginitis: NEGATIVE
Chlamydia: NEGATIVE
Comment: NEGATIVE
Comment: NEGATIVE
Comment: NEGATIVE
Comment: NEGATIVE
Comment: NEGATIVE
Comment: NORMAL
Neisseria Gonorrhea: NEGATIVE
Trichomonas: POSITIVE — AB

## 2021-12-21 MED ORDER — METRONIDAZOLE 500 MG PO TABS
500.0000 mg | ORAL_TABLET | Freq: Two times a day (BID) | ORAL | 0 refills | Status: DC
  Filled 2021-12-21: qty 14, 7d supply, fill #0

## 2021-12-22 ENCOUNTER — Encounter: Payer: Self-pay | Admitting: Internal Medicine

## 2021-12-22 LAB — URINE CULTURE

## 2021-12-25 ENCOUNTER — Encounter: Payer: Self-pay | Admitting: Psychiatry

## 2022-01-03 ENCOUNTER — Ambulatory Visit: Payer: Self-pay | Admitting: Orthopaedic Surgery

## 2022-01-18 ENCOUNTER — Other Ambulatory Visit: Payer: Self-pay

## 2022-01-18 ENCOUNTER — Other Ambulatory Visit: Payer: Self-pay | Admitting: Internal Medicine

## 2022-01-18 ENCOUNTER — Other Ambulatory Visit: Payer: Self-pay | Admitting: Family Medicine

## 2022-01-18 DIAGNOSIS — L500 Allergic urticaria: Secondary | ICD-10-CM

## 2022-01-18 MED ORDER — HYDROXYZINE HCL 25 MG PO TABS
25.0000 mg | ORAL_TABLET | ORAL | 0 refills | Status: DC
  Filled 2022-01-18 – 2022-02-13 (×2): qty 30, 4d supply, fill #0

## 2022-01-18 NOTE — Telephone Encounter (Signed)
Requested medications are due for refill today. yes  Requested medications are on the active medications list.  yes  Last refill. 04/13/2021 #30 0 rf  Future visit scheduled.   yes  Notes to clinic.  Dr. Andrey Campanile signed rx.    Requested Prescriptions  Pending Prescriptions Disp Refills   hydrOXYzine (ATARAX) 25 MG tablet 30 tablet 0    Sig: TAKE 1 TO 2 TABLETS BY MOUTH EVERY 6 TO 8 HOURS AS NEEDED FOR ITCHING     Ear, Nose, and Throat:  Antihistamines 2 Failed - 01/18/2022  7:08 AM      Failed - Cr in normal range and within 360 days    Creatinine, Ser  Date Value Ref Range Status  12/20/2021 1.21 (H) 0.44 - 1.00 mg/dL Final         Passed - Valid encounter within last 12 months    Recent Outpatient Visits           5 months ago Cervical radiculopathy   [facility] Jonah Blue B, MD   6 months ago Pap smear for cervical cancer screening   [facility] Marcine Matar, MD   8 months ago Morbid obesity [facility])   [facility] Marcine Matar, MD   11 months ago Elevated blood-pressure reading without diagnosis of hypertension   [facility] [place], Cornelius Moras, RPH-CPP   12 months ago Acute neck pain    [facility] Marcine Matar, MD       Future Appointments             Tomorrow Marcine Matar, MD [facility]   In 2 months Magnus Ivan, Vanita Panda, MD [facility]

## 2022-01-19 ENCOUNTER — Ambulatory Visit: Payer: Medicare Other | Attending: Internal Medicine | Admitting: Internal Medicine

## 2022-01-19 DIAGNOSIS — M5412 Radiculopathy, cervical region: Secondary | ICD-10-CM

## 2022-01-19 DIAGNOSIS — K921 Melena: Secondary | ICD-10-CM

## 2022-01-19 DIAGNOSIS — Z1211 Encounter for screening for malignant neoplasm of colon: Secondary | ICD-10-CM

## 2022-01-19 DIAGNOSIS — Z87891 Personal history of nicotine dependence: Secondary | ICD-10-CM

## 2022-01-19 DIAGNOSIS — M170 Bilateral primary osteoarthritis of knee: Secondary | ICD-10-CM

## 2022-01-19 NOTE — Progress Notes (Signed)
Patient ID: Jenna [name], female   DOB: 1970/04/28, 51 y.o.   MRN: [id number] Virtual Visit via Telephone Note  I connected with Jenna [name] on 01/19/2022 at 5:07 PM by telephone and verified that I am speaking with the correct person using two identifiers  Location: Patient: home Provider: office  Participants: Myself Patient   I discussed the limitations, risks, security and privacy concerns of performing an evaluation and management service by telephone and the availability of in person appointments. I also discussed with the patient that there may be a patient responsible charge related to this service. The patient expressed understanding and agreed to proceed.   History of Present Illness: Patient with history of morbid obesity, preDM, OA of the knees, chronic lower back pain.  Depression (on Cymbalta for LBP and mood), vitamin D deficiency   OA knees:  needs TKR on LT.  Told by Dr. Magnus Ivan that BMI needs to be at 40. She has seen nutritionist.  She stopped using sugar and honey and using Splenda instead; cut back on drinking coffee.  Some days she only eat once a day.  Not able to do as much as she would like because of the pain in her knees.  She stopped smoking 2 mths ago.   She was approved for disability and has Medicaid and Medicare Saw Dr. Ophelia Charter 09/2021 for cervical radiculopathy.  Had OC at the time.  Reports he did not do anything and did not exam her.  Reports being told he could not do anything based on her coverage at the time.   Since then she was approved for disability and has Medicaid/Medicare. She would like to be sent to someone else  Request referral for c-scope for screening.  She has noted bright red blood in stools intermittently.  Last was 2 mths ago.  She was not constipated.  Stools ususally soft and regular.    HM:  Declines flu shot.  Request referral for c-scope for screening  Outpatient Encounter Medications as of 01/19/2022  Medication Sig    DULoxetine (CYMBALTA) 60 MG capsule Take 1 capsule (60mg ) by mouth once per day for back pain and mood.   ferrous sulfate 324 MG TBEC Take 1 tablet (324 mg total) by mouth every other day.   fluticasone (FLONASE) 50 MCG/ACT nasal spray Place 2 sprays into both nostrils daily.   gabapentin (NEURONTIN) 300 MG capsule Take 1 capsule (300 mg total) by mouth 3 (three) times daily as needed.   hydrOXYzine (ATARAX) 25 MG tablet TAKE 1 TO 2 TABLETS BY MOUTH EVERY 6 TO 8 HOURS AS NEEDED FOR ITCHING   metroNIDAZOLE (FLAGYL) 500 MG tablet Take 1 tablet (500 mg total) by mouth 2 (two) times daily.   naproxen (NAPROSYN) 500 MG tablet Take 1 tablet (500 mg total) by mouth 2 (two) times daily with a meal as needed for pain.   rOPINIRole (REQUIP) 0.5 MG tablet Take 1 tablet (0.5 mg total) by mouth at bedtime.   tiZANidine (ZANAFLEX) 4 MG tablet Take 1 tablet (4 mg total) by mouth every 8 (eight) hours as needed for muscle spasms.   topiramate (TOPAMAX) 25 MG tablet Take 25 mg (1 pill) at bedtime for one week, then increase to 50 mg (2 pills) at bedtime for one week, then take 75 mg (3 pills) at bedtime for one week, then take 100 mg (4 pills) at bedtime and continue on this dose   Vitamin D, Ergocalciferol, (DRISDOL) 1.25 MG (50000 UNIT) CAPS capsule Take  1 capsule (50,000 Units total) by mouth every 7 (seven) days.   No facility-administered encounter medications on file as of 01/19/2022.      Observations/Objective: No direct observation done as this was a telephone visit.  Assessment and Plan: 1. Morbid obesity (HCC) Commended her on changes that she has made in her eating habits so far.  Now that she has insurance coverage, discussed option of sending her to [facility] to help her achieve her weight loss goal.  Patient is agreeable to this. - Amb Ref to Medical Weight Management  2. Primary osteoarthritis of both knees See #1 above.  3. Cervical radiculopathy I will refer her to  [place] [facility] - Ambulatory referral to Neurosurgery  4. Blood in stool - Ambulatory referral to Gastroenterology  5. Screening for colon cancer - Ambulatory referral to Gastroenterology  6. Former smoker Commended on quitting.  Encouraged her to remain tobacco free.  7.  Decline flu shot    Follow Up Instructions: 1 mth for Medicare Wellness Visit   I discussed the assessment and treatment plan with the patient. The patient was provided an opportunity to ask questions and all were answered. The patient agreed with the plan and demonstrated an understanding of the instructions.   The patient was advised to call back or seek an in-person evaluation if the symptoms worsen or if the condition fails to improve as anticipated.  I  Spent 19 minutes on this telephone encounter  This note has been created with Education officer, environmental. Any transcriptional errors are unintentional.  Jonah Blue, MD

## 2022-01-22 ENCOUNTER — Encounter: Payer: Self-pay | Admitting: Gastroenterology

## 2022-01-23 ENCOUNTER — Encounter (INDEPENDENT_AMBULATORY_CARE_PROVIDER_SITE_OTHER): Payer: Self-pay

## 2022-01-24 ENCOUNTER — Other Ambulatory Visit: Payer: Self-pay

## 2022-01-25 ENCOUNTER — Other Ambulatory Visit: Payer: Self-pay

## 2022-02-13 ENCOUNTER — Ambulatory Visit (INDEPENDENT_AMBULATORY_CARE_PROVIDER_SITE_OTHER): Payer: Medicare Other | Admitting: Internal Medicine

## 2022-02-13 ENCOUNTER — Other Ambulatory Visit (HOSPITAL_COMMUNITY): Payer: Self-pay

## 2022-02-13 ENCOUNTER — Other Ambulatory Visit: Payer: Self-pay | Admitting: Orthopaedic Surgery

## 2022-02-13 ENCOUNTER — Encounter (INDEPENDENT_AMBULATORY_CARE_PROVIDER_SITE_OTHER): Payer: Self-pay | Admitting: Internal Medicine

## 2022-02-13 VITALS — BP 140/84 | HR 71 | Temp 98.7°F | Ht 66.0 in | Wt 318.0 lb

## 2022-02-13 DIAGNOSIS — E559 Vitamin D deficiency, unspecified: Secondary | ICD-10-CM | POA: Diagnosis not present

## 2022-02-13 DIAGNOSIS — Z6841 Body Mass Index (BMI) 40.0 and over, adult: Secondary | ICD-10-CM

## 2022-02-13 DIAGNOSIS — R7303 Prediabetes: Secondary | ICD-10-CM

## 2022-02-13 DIAGNOSIS — Z0289 Encounter for other administrative examinations: Secondary | ICD-10-CM

## 2022-02-13 DIAGNOSIS — R030 Elevated blood-pressure reading, without diagnosis of hypertension: Secondary | ICD-10-CM

## 2022-02-13 MED ORDER — GABAPENTIN 300 MG PO CAPS
300.0000 mg | ORAL_CAPSULE | Freq: Three times a day (TID) | ORAL | 1 refills | Status: DC | PRN
  Filled 2022-02-13: qty 60, 20d supply, fill #0

## 2022-02-13 NOTE — Progress Notes (Signed)
Office: [phone]  /  Fax: [phone]   Initial Visit  Jenna [name] was seen in clinic today to evaluate for obesity. She is interested in losing weight to improve overall health and reduce the risk of weight related complications. She presents today to review program treatment options, initial physical assessment, and evaluation.     She was referred by: Self-Referral she was a former patient of Dr. Manson Passey  When asked what else they would like to accomplish? She states: Adopt healthier eating patterns, Improve existing medical conditions, and Improve quality of life  When asked how has your weight affected you? She states: Contributed to medical problems, Contributed to orthopedic problems or mobility issues, Having fatigue, Having poor endurance, and Problems with eating patterns  Some associated conditions: Arthritis, Prediabetes, and Other: Vitamin D deficiency  Contributing factors: Family history, Disruption of circadian rhythm, Nutritional, Medications, Reduced physical activity, Eating patterns, and Pregnancy  Weight promoting medications identified: Psychotropic medications, Antiepileptics, and Anticholinergics  Current nutrition plan: None  Current level of physical activity: None  Current or previous pharmacotherapy: Topiramate  Response to medication: Other: Being used for migraines now for weight loss   Past medical history includes:   Past Medical History:  Diagnosis Date   Arthritis    Back pain    Chest pain    Depression    Edema of both lower extremities    High blood pressure    Joint pain    Osteoarthritis      Objective:   BP (!) 140/84   Pulse 71   Temp 98.7 F (37.1 C)   Ht 5\' 6"  (1.676 m)   Wt (!) 318 lb (144.2 kg)   SpO2 97%   BMI 51.33 kg/m  She was weighed on the bioimpedance scale: Body mass index is 51.33 kg/m.  Peak Weight: 320,Visceral Fat Rating: 19, Body Fat%: 51.9, Weight trend over the last 12 months:  Decreasing  General:  Alert, oriented and cooperative. Patient is in no acute distress.  Respiratory: Normal respiratory effort, no problems with respiration noted  Extremities: Normal range of motion.    Mental Status: Normal mood and affect. Normal behavior. Normal judgment and thought content.   Assessment and Plan:  1. Class 3 severe obesity without serious comorbidity with body mass index (BMI) of 50.0 to 59.9 in adult, unspecified obesity type (HCC) We reviewed weight, biometrics, associated medical conditions and contributing factors with patient. She would benefit from weight loss therapy via a modified calorie, low-carb, high-protein nutritional plan tailored to their REE (resting energy expenditure) which will be determined by indirect calorimetry.  We will also assess for cardiometabolic risk and nutritional derangements via fasting serologies at her next appointment.   She was a former patient and displays understanding of energy balance, portion control and reading food labels.  2. Prediabetes Her last A1c was 6.0.  She is at risk for progression.  She could lower her risk by losing 7 to 10% body weight and increasing physical activity to 150 minutes a week.  She may also be a candidate for metformin.  3. Vitamin D deficiency Vitamin D levels were 14.8 which is associated with adiposity, leptin resistance and may contribute to weight gain and fatigue.  We will check vitamin D levels with intake labs and recommend supplementation.  4. Elevated blood pressure reading without diagnosis of hypertension She reports being on blood pressure medications in the past on and off as her blood pressure seems to fluctuate.  I recommend  that she monitor her blood pressure twice a day at least 3 times a week at home including in the morning and before bedtime to look at trends.  I also noticed on recent labs she had a decreased GFR and this needs to be repeated.        Obesity Treatment /  Action Plan:  Patient will work on garnering support from family and friends to begin weight loss journey. Will work on eliminating or reducing the presence of highly palatable, calorie dense foods in the home. Will complete provided nutritional and psychosocial assessment questionnaire before the next appointment. Will be scheduled for indirect calorimetry to determine resting energy expenditure in a fasting state.  This will allow [place] to create a reduced calorie, high-protein meal plan to promote loss of fat mass while preserving muscle mass. Will work on reducing intake of added sugars, simple sugars and processed carbs. Counseled on the health benefits of losing 5%-15% of total body weight. Was counseled on nutritional approaches to weight loss and benefits of complex carbs and high quality protein as part of nutritional weight management.  Obesity Education Performed Today:  She was weighed on the bioimpedance scale and results were discussed and documented in the synopsis.  We discussed obesity as a disease and the importance of a more detailed evaluation of all the factors contributing to the disease.  We discussed the importance of long term lifestyle changes which include nutrition, exercise and behavioral modifications as well as the importance of customizing this to her specific health and social needs.  We discussed the benefits of reaching a healthier weight to alleviate the symptoms of existing conditions and reduce the risks of the biomechanical, metabolic and psychological effects of obesity.  Neveen Daponte appears to be in the action stage of change and states they are ready to start intensive lifestyle modifications and behavioral modifications.  30 minutes was spent today on this visit including the above counseling, pre-visit chart review, and post-visit documentation.  Reviewed by clinician on day of visit: allergies, medications, problem list, medical history, surgical  history, family history, social history, and previous encounter notes.    I have reviewed the above documentation for accuracy and completeness, and I agree with the above.  Worthy Rancher, MD

## 2022-02-14 ENCOUNTER — Other Ambulatory Visit (HOSPITAL_COMMUNITY): Payer: Self-pay

## 2022-02-14 ENCOUNTER — Encounter (INDEPENDENT_AMBULATORY_CARE_PROVIDER_SITE_OTHER): Payer: Medicare Other | Admitting: Family Medicine

## 2022-02-15 ENCOUNTER — Other Ambulatory Visit (HOSPITAL_COMMUNITY): Payer: Self-pay

## 2022-02-16 ENCOUNTER — Other Ambulatory Visit (HOSPITAL_COMMUNITY): Payer: Self-pay

## 2022-02-19 ENCOUNTER — Telehealth: Payer: Self-pay

## 2022-02-19 NOTE — Telephone Encounter (Signed)
Dr. Tomasa Rand,  In performing chart prep for this patient for her Pre Visit appt scheduled on 02/26/2022, it was found that her BMI is 51.33 as of 02/13/2022.   Would you like for this patient to have an OV prior to being scheduled at [facility] or be scheduled for a direct at [facility]?   Please advise Thank you

## 2022-02-21 ENCOUNTER — Telehealth: Payer: Self-pay | Admitting: *Deleted

## 2022-02-21 NOTE — Telephone Encounter (Signed)
Yesi,  This pt's BMI is greater than 50; their procedure will need to be performed at the hospital.  Thanks,  Larico Dimock 

## 2022-02-24 ENCOUNTER — Other Ambulatory Visit (HOSPITAL_COMMUNITY): Payer: Self-pay

## 2022-02-26 NOTE — Telephone Encounter (Signed)
Pts OV cancelled. She knows she will get a call when we have availability at the hospital.

## 2022-02-26 NOTE — Telephone Encounter (Signed)
Maya did you add this lady to Dr. Judeth Horn list?

## 2022-02-26 NOTE — Telephone Encounter (Signed)
PV & Procedure cancelled. Pt aware she will be called when hospital procedure is scheduled.

## 2022-02-26 NOTE — Telephone Encounter (Addendum)
This patient has been scheduled for an OV with Dr. Tomasa Rand to be seen as a new patient in January prior to having her procedure scheduled at [facility].

## 2022-02-28 ENCOUNTER — Encounter: Payer: Self-pay | Admitting: Internal Medicine

## 2022-03-14 ENCOUNTER — Encounter (INDEPENDENT_AMBULATORY_CARE_PROVIDER_SITE_OTHER): Payer: Self-pay | Admitting: Internal Medicine

## 2022-03-14 ENCOUNTER — Ambulatory Visit (INDEPENDENT_AMBULATORY_CARE_PROVIDER_SITE_OTHER): Payer: 59 | Admitting: Internal Medicine

## 2022-03-14 VITALS — BP 138/85 | HR 60 | Temp 98.0°F | Ht 66.0 in | Wt 321.0 lb

## 2022-03-14 DIAGNOSIS — R5383 Other fatigue: Secondary | ICD-10-CM

## 2022-03-14 DIAGNOSIS — N179 Acute kidney failure, unspecified: Secondary | ICD-10-CM

## 2022-03-14 DIAGNOSIS — R0602 Shortness of breath: Secondary | ICD-10-CM

## 2022-03-14 DIAGNOSIS — Z6841 Body Mass Index (BMI) 40.0 and over, adult: Secondary | ICD-10-CM

## 2022-03-14 DIAGNOSIS — E559 Vitamin D deficiency, unspecified: Secondary | ICD-10-CM

## 2022-03-14 DIAGNOSIS — Z1331 Encounter for screening for depression: Secondary | ICD-10-CM

## 2022-03-14 DIAGNOSIS — R030 Elevated blood-pressure reading, without diagnosis of hypertension: Secondary | ICD-10-CM | POA: Diagnosis not present

## 2022-03-14 DIAGNOSIS — E669 Obesity, unspecified: Secondary | ICD-10-CM

## 2022-03-14 DIAGNOSIS — R7303 Prediabetes: Secondary | ICD-10-CM

## 2022-03-15 LAB — COMPREHENSIVE METABOLIC PANEL
ALT: 17 IU/L (ref 0–32)
AST: 17 IU/L (ref 0–40)
Albumin/Globulin Ratio: 1.5 (ref 1.2–2.2)
Albumin: 4.2 g/dL (ref 3.9–4.9)
Alkaline Phosphatase: 78 IU/L (ref 44–121)
BUN/Creatinine Ratio: 12 (ref 9–23)
BUN: 10 mg/dL (ref 6–24)
Bilirubin Total: 0.4 mg/dL (ref 0.0–1.2)
CO2: 20 mmol/L (ref 20–29)
Calcium: 9.6 mg/dL (ref 8.7–10.2)
Chloride: 106 mmol/L (ref 96–106)
Creatinine, Ser: 0.86 mg/dL (ref 0.57–1.00)
Globulin, Total: 2.8 g/dL (ref 1.5–4.5)
Glucose: 100 mg/dL — ABNORMAL HIGH (ref 70–99)
Potassium: 4.7 mmol/L (ref 3.5–5.2)
Sodium: 139 mmol/L (ref 134–144)
Total Protein: 7 g/dL (ref 6.0–8.5)
eGFR: 82 mL/min/{1.73_m2} (ref >59)

## 2022-03-15 LAB — INSULIN, RANDOM: INSULIN: 41.6 u[IU]/mL — ABNORMAL HIGH (ref 2.6–24.9)

## 2022-03-15 LAB — VITAMIN B12: Vitamin B-12: 542 pg/mL (ref 232–1245)

## 2022-03-15 LAB — HEMOGLOBIN A1C
Est. average glucose Bld gHb Est-mCnc: 134 mg/dL
Hgb A1c MFr Bld: 6.3 % — ABNORMAL HIGH (ref 4.8–5.6)

## 2022-03-15 LAB — LIPID PANEL WITH LDL/HDL RATIO
Cholesterol, Total: 140 mg/dL (ref 100–199)
HDL: 46 mg/dL (ref >39)
LDL Chol Calc (NIH): 80 mg/dL (ref 0–99)
LDL/HDL Ratio: 1.7 ratio (ref 0.0–3.2)
Triglycerides: 68 mg/dL (ref 0–149)
VLDL Cholesterol Cal: 14 mg/dL (ref 5–40)

## 2022-03-15 LAB — VITAMIN D 25 HYDROXY (VIT D DEFICIENCY, FRACTURES): Vit D, 25-Hydroxy: 14.6 ng/mL — ABNORMAL LOW (ref 30.0–100.0)

## 2022-03-23 ENCOUNTER — Encounter: Payer: No Typology Code available for payment source | Admitting: Gastroenterology

## 2022-03-28 ENCOUNTER — Telehealth (INDEPENDENT_AMBULATORY_CARE_PROVIDER_SITE_OTHER): Payer: Self-pay | Admitting: Internal Medicine

## 2022-03-28 ENCOUNTER — Other Ambulatory Visit: Payer: Self-pay

## 2022-03-28 ENCOUNTER — Ambulatory Visit (INDEPENDENT_AMBULATORY_CARE_PROVIDER_SITE_OTHER): Payer: 59 | Admitting: Internal Medicine

## 2022-03-28 ENCOUNTER — Encounter (INDEPENDENT_AMBULATORY_CARE_PROVIDER_SITE_OTHER): Payer: Self-pay | Admitting: Internal Medicine

## 2022-03-28 VITALS — BP 134/69 | HR 93 | Temp 98.3°F | Ht 66.0 in | Wt 313.0 lb

## 2022-03-28 DIAGNOSIS — N179 Acute kidney failure, unspecified: Secondary | ICD-10-CM

## 2022-03-28 DIAGNOSIS — R7303 Prediabetes: Secondary | ICD-10-CM

## 2022-03-28 DIAGNOSIS — E559 Vitamin D deficiency, unspecified: Secondary | ICD-10-CM

## 2022-03-28 DIAGNOSIS — E66813 Obesity, class 3: Secondary | ICD-10-CM

## 2022-03-28 DIAGNOSIS — Z6841 Body Mass Index (BMI) 40.0 and over, adult: Secondary | ICD-10-CM

## 2022-03-28 MED ORDER — VITAMIN D (ERGOCALCIFEROL) 1.25 MG (50000 UNIT) PO CAPS
50000.0000 [IU] | ORAL_CAPSULE | ORAL | 0 refills | Status: DC
  Filled 2022-03-28: qty 12, 84d supply, fill #0

## 2022-03-28 MED ORDER — METFORMIN HCL ER 500 MG PO TB24: 500.0000 mg | ORAL_TABLET | Freq: Every evening | ORAL | 0 refills | Status: DC

## 2022-03-28 MED ORDER — VITAMIN D (ERGOCALCIFEROL) 1.25 MG (50000 UNIT) PO CAPS: 50000.0000 [IU] | ORAL_CAPSULE | ORAL | 0 refills | Status: DC

## 2022-03-28 NOTE — Telephone Encounter (Signed)
I called patient. Will send Vit D to [facility].

## 2022-03-28 NOTE — Progress Notes (Signed)
Chief Complaint:   OBESITY Jenna [name] (MR# [id number]) is a 52 y.o. female who presents for evaluation and treatment of obesity and related comorbidities. Current BMI is Body mass index is 51.81 kg/m. Jenna [name] has been struggling with her weight for many years and has been unsuccessful in either losing weight, maintaining weight loss, or reaching her healthy weight goal.  Jenna [name] is currently in the action stage of change and ready to dedicate time achieving and maintaining a healthier weight. Jenna [name] is interested in becoming our patient and working on intensive lifestyle modifications including (but not limited to) diet and exercise for weight loss.  Jenna [name]'s habits were reviewed today and are as follows: Her family eats meals together, she thinks her family will eat healthier with her, her desired weight loss is 81 lbs, she has been heavy most of her life, she started gaining weight after childbirth, her heaviest weight ever was 320 pounds, she has significant food cravings issues, she skips meals frequently, she is frequently drinking liquids with calories, she frequently makes poor food choices, and she struggles with emotional eating.  Depression Screen Jenna [name]'s Food and Mood (modified PHQ-9) score was 20.  Subjective:   1. Other fatigue Jenna [name] admits to daytime somnolence and admits to waking up still tired. Patient has a history of symptoms of daytime fatigue, morning fatigue, and morning headache. Jenna [name] generally gets 4 or 5 hours of sleep per night, and states that she has nightime awakenings. Snoring is present. Apneic episodes are present. Epworth Sleepiness Score is 14. Reviewed labs, normal limit CBC and TSH in 12/2021.  2. SOB (shortness of breath) on exertion Jenna [name] notes increasing shortness of breath with exercising and seems to be worsening over time with weight gain. She notes getting out of breath sooner with activity than she used to. This has not  gotten worse recently. Jenna [name] denies shortness of breath at rest or orthopnea.  3. Prediabetes Last A1c 6.0 in 09/2020. Counseled on condition and risk of progression. I discussed labs with the patient today.   4. Acute kidney injury ([place]) GFR 55 in 12/2021. 6 new medications. Inadequate fluid intake noted. I discussed labs with the patient today.   5. Vitamin D deficiency She is on high dose Vitamin D with no side effects.   6. Elevated blood pressure reading without diagnosis of hypertension Multiple elevations in the past. She has a blood pressure monitor. Suspect she may have hypertension. Repeat blood pressure 138/85.  Assessment/Plan:   1. Other fatigue Jenna [name] does feel that her weight is causing her energy to be lower than it should be. Fatigue may be related to obesity, depression or many other causes. Labs will be ordered, and in the meanwhile, Jenna [name] will focus on self care including making healthy food choices, increasing physical activity and focusing on stress reduction.  - EKG 12-Lead - Vitamin B12 - Lipid Panel With LDL/HDL Ratio  2. SOB (shortness of breath) on exertion Jenna [name] does feel that she gets out of breath more easily that she used to when she exercises. Jenna [name]'s shortness of breath appears to be obesity related and exercise induced. She has agreed to work on weight loss and gradually increase exercise to treat her exercise induced shortness of breath. Will continue to monitor closely.  3. Prediabetes We will check labs today. Patient will work on weight loss therapy.   - Insulin, random - Hemoglobin A1c  4. Acute kidney injury ([place]) We will check labs today. Patient to avoid nephrotoxins,  and increase her water intake. Work-up if abnormal by her PCP.   - Comprehensive metabolic panel  5. Vitamin D deficiency We will check Vitamin D level today. Consider transitioning to OTC.   - VITAMIN D 25 Hydroxy (Vit-D Deficiency, Fractures)  6. Elevated  blood pressure reading without diagnosis of hypertension Check blood pressure at home. Technique reviewed with the patient. Twice a day and keep a log. Recommended pharmacotherapy in addition to weight loss therapy if elevated.   7. Depression screen Jenna [name] had a positive depression screening. Depression is commonly associated with obesity and often results in emotional eating behaviors. We will monitor this closely and work on CBT to help improve the non-hunger eating patterns. Referral to Psychology may be required if no improvement is seen as she continues in our clinic.  8. Obesity with current BMI of 51.8 Jenna [name] is currently in the action stage of change and her goal is to continue with weight loss efforts. I recommend Jenna [name] begin the structured treatment plan as follows:  She has agreed to the Category 3 Plan.  May transition to Category 2.  Exercise goals: All adults should avoid inactivity. Some physical activity is better than none, and adults who participate in any amount of physical activity gain some health benefits. (Limited due to orthopedic problems, and chronic pain).   Behavioral modification strategies: increasing lean protein intake, increasing water intake, meal planning and cooking strategies, keeping healthy foods in the home, avoiding temptations, and planning for success.  She was informed of the importance of frequent follow-up visits to maximize her success with intensive lifestyle modifications for her multiple health conditions. She was informed we would discuss her lab results at her next visit unless there is a critical issue that needs to be addressed sooner. Jenna [name] agreed to keep her next visit at the agreed upon time to discuss these results.  Objective:   Blood pressure 138/85, pulse 60, temperature 98 F (36.7 C), height 5\' 6"  (1.676 m), weight (!) 321 lb (145.6 kg), last menstrual period 01/31/2022, SpO2 100 %. Body mass index is 51.81 kg/m.  EKG:  Normal sinus rhythm, rate 77 BPM.  Indirect Calorimeter completed today shows a VO2 of 293 and a REE of 2016.  Her calculated basal metabolic rate is 5784 thus her basal metabolic rate is worse than expected.  General: Cooperative, alert, well developed, in no acute distress. HEENT: Conjunctivae and lids unremarkable. Cardiovascular: Regular rhythm.  Lungs: Normal work of breathing. Neurologic: No focal deficits.   Lab Results  Component Value Date   CREATININE 0.86 03/14/2022   BUN 10 03/14/2022   NA 139 03/14/2022   K 4.7 03/14/2022   CL 106 03/14/2022   CO2 20 03/14/2022   Lab Results  Component Value Date   ALT 17 03/14/2022   AST 17 03/14/2022   ALKPHOS 78 03/14/2022   BILITOT 0.4 03/14/2022   Lab Results  Component Value Date   HGBA1C 6.3 (H) 03/14/2022   HGBA1C 5.8 05/23/2021   HGBA1C 6.0 (H) 10/06/2020   HGBA1C 5.5 07/28/2019   HGBA1C 5.4 12/08/2018   Lab Results  Component Value Date   INSULIN 41.6 (H) 03/14/2022   INSULIN 16.1 07/28/2019   INSULIN 7.4 12/08/2018   Lab Results  Component Value Date   TSH 1.655 12/20/2021   Lab Results  Component Value Date   CHOL 140 03/14/2022   HDL 46 03/14/2022   LDLCALC 80 03/14/2022   TRIG 68 03/14/2022   CHOLHDL 3.8 10/06/2020  Lab Results  Component Value Date   WBC 8.1 12/20/2021   HGB 12.5 12/20/2021   HCT 38.9 12/20/2021   MCV 85.1 12/20/2021   PLT 333 12/20/2021   Lab Results  Component Value Date   IRON 53 04/02/2019   TIBC 360 04/02/2019   FERRITIN 26 04/02/2019   Attestation Statements:   Reviewed by clinician on day of visit: allergies, medications, problem list, medical history, surgical history, family history, social history, and previous encounter notes.  Time spent on visit including pre-visit chart review and post-visit charting and care was 40 minutes.   Trude Mcburney, am acting as transcriptionist for Worthy Rancher, MD.  I have reviewed the above documentation for  accuracy and completeness, and I agree with the above. -Worthy Rancher, MD

## 2022-03-28 NOTE — Progress Notes (Signed)
Interval History: Ms. Nodal presents today for follow-up.  Since last office visit she has lost 8 pounds.  She is really excited about her progress and reports good adherence to nutritional plan.  She wakes up late so has a difficult time splitting so she is having a difficult time getting 3 meals a day.  She therefore has been combining her breakfast and lunch which has not recommended.  She has done a great job avoiding temptation and turning high calorie foods away.  She is also increase water consumption and has eliminated soda's.     Plan: Patient counseled on mindfulness also differentiation between physiological hunger and cravings.  She will work on eating 3 meals a day and avoid combining as this may result in partitioning.  This may also explain what she is hungry at night.    1. Acute kidney injury ([place]) Reviewed recent labs her GFR is now 92.  Her blood pressure is well-controlled.  Continue with hydration and monitoring.  2. Prediabetes Most recent A1c is 6.3 with associated elevated insulin levels.  Patient informed of disease state and risk of progression. This may contribute to abnormal cravings, fatigue and diabetes complications without having diabetes.   We reviewed treatment options which include weight loss of about 7 to 10% of body weight, increasing physical activity to 150 minutes a week of moderate intensity and pharmacotherapy with metformin.  After discussion of benefits and side effects she is agreeable to starting metformin in addition to prescribed nutritional plan.   3. Vitamin D deficiency Associated with excess adiposity and may result in leptin resistance and adipogenesis.  Vitamin D levels were 14.6.  After discussion of benefits, alternative treatment options and side effects patient will be started on vitamin D2 50,000 units 1 tablet weekly for 3-4 months. for a treatment goal level of 50-60 mg/dl. Check levels at that time for response monitoring.  4. Class 3  severe obesity with serious comorbidity and body mass index (BMI) of 50.0 to 59.9 in adult, unspecified obesity type ([place])

## 2022-03-28 NOTE — Telephone Encounter (Signed)
Patient needs her pharmacy changed due to the pharmacy/insurance not covering her medication( Vitamin D) . Please call patient

## 2022-03-29 ENCOUNTER — Other Ambulatory Visit: Payer: Self-pay

## 2022-04-02 ENCOUNTER — Encounter (INDEPENDENT_AMBULATORY_CARE_PROVIDER_SITE_OTHER): Payer: Self-pay | Admitting: Internal Medicine

## 2022-04-02 NOTE — Telephone Encounter (Signed)
Called patient to address concern regarding metformin and concomitant use of topiramate. This combination has been used in the past in diabetic patient which showed decrease in BW and improved glycemic control without events of lactic acidosis. We will check a CMP in 4 weeks and patient cautioned on symptoms to monitor for.

## 2022-04-03 ENCOUNTER — Ambulatory Visit: Payer: No Typology Code available for payment source | Admitting: Gastroenterology

## 2022-04-03 NOTE — Progress Notes (Signed)
Chief Complaint:   OBESITY Jenna [name] is here to discuss her progress with her obesity treatment plan along with follow-up of her obesity related diagnoses. Jenna [name] is on the Category 3 Plan and states she is following her eating plan approximately (unknown)% of the time. Jenna [name] states she is walking for 35 minutes 7 times per week.  Today's visit was #: 2 Starting weight: 321 lbs Starting date: 03/14/2022 Today's weight: 313 lbs Today's date: 03/28/2022 Total lbs lost to date: 8 Total lbs lost since last in-office visit: 8  Interim History: Jenna [name] presents today for follow-up.  Since last office visit she has lost 8 pounds.  She is really excited about her progress and reports good adherence to nutritional plan.  She wakes up late so has a difficult time splitting so she is having a difficult time getting 3 meals a day.  She therefore has been combining her breakfast and lunch which has not recommended.  She has done a great job avoiding temptation and turning high calorie foods away.  She is also increase water consumption and has eliminated soda's.    Subjective:   1. Acute kidney injury [facility]) Reviewed recent labs her GFR is now 52.  Her blood pressure is well-controlled.  2. Prediabetes Most recent A1c is 6.3 with associated elevated insulin levels.  Patient informed of disease state and risk of progression. This may contribute to abnormal cravings, fatigue and diabetes complications without having diabetes.   3. Vitamin D deficiency Associated with excess adiposity and may result in leptin resistance and adipogenesis.  Vitamin D levels were 14.6.   Assessment/Plan:   1. Acute kidney injury ([place]) Continue with hydration and monitoring.  2. Prediabetes We reviewed treatment options which include weight loss of about 7 to 10% of body weight, increasing physical activity to 150 minutes a week of moderate intensity and pharmacotherapy with metformin.  After discussion of  benefits and side effects she is agreeable to starting metformin in addition to prescribed nutritional plan.  - metFORMIN (GLUCOPHAGE-XR) 500 MG 24 hr tablet; Take 1 tablet (500 mg total) by mouth every evening.  Dispense: 30 tablet; Refill: 0  3. Vitamin D deficiency After discussion of benefits, alternative treatment options and side effects patient will be started on vitamin D2 50,000 units 1 tablet weekly for 3-4 months. for a treatment goal level of 50-60 mg/dl. Check levels at that time for response monitoring.  - Vitamin D, Ergocalciferol, (DRISDOL) 1.25 MG (50000 UNIT) CAPS capsule; Take 1 capsule (50,000 Units total) by mouth every 7 (seven) days.  Dispense: 12 capsule; Refill: 0  4. Class 3 severe obesity with serious comorbidity and body mass index (BMI) of 50.0 to 59.9 in adult, unspecified obesity type [facility]) Patient counseled on mindfulness also differentiation between physiological hunger and cravings.  She will work on eating 3 meals a day and avoid combining as this may result in partitioning.  This may also explain what she is hungry at night.    Jenna [name] is currently in the action stage of change. As such, her goal is to continue with weight loss efforts. She has agreed to the Category 3 Plan.   Exercise goals: For substantial health benefits, adults should do at least 150 minutes (2 hours and 30 minutes) a week of moderate-intensity, or 75 minutes (1 hour and 15 minutes) a week of vigorous-intensity aerobic physical activity, or an equivalent combination of moderate- and vigorous-intensity aerobic activity. Aerobic activity should be performed in episodes of  at least 10 minutes, and preferably, it should be spread throughout the week.  Behavioral modification strategies: increasing lean protein intake, increasing vegetables, increasing water intake, no skipping meals, meal planning and cooking strategies, avoiding temptations, and planning for success.  Jenna [name] has agreed to  follow-up with our clinic in 2 to 3 weeks. She was informed of the importance of frequent follow-up visits to maximize her success with intensive lifestyle modifications for her multiple health conditions.   Objective:   Blood pressure 134/69, pulse 93, temperature 98.3 F (36.8 C), height 5\' 6"  (1.676 m), weight (!) 313 lb (142 kg), last menstrual period 01/31/2022, SpO2 100 %. Body mass index is 50.52 kg/m.  General: Cooperative, alert, well developed, in no acute distress. HEENT: Conjunctivae and lids unremarkable. Cardiovascular: Regular rhythm.  Lungs: Normal work of breathing. Neurologic: No focal deficits.   Lab Results  Component Value Date   CREATININE 0.86 03/14/2022   BUN 10 03/14/2022   NA 139 03/14/2022   K 4.7 03/14/2022   CL 106 03/14/2022   CO2 20 03/14/2022   Lab Results  Component Value Date   ALT 17 03/14/2022   AST 17 03/14/2022   ALKPHOS 78 03/14/2022   BILITOT 0.4 03/14/2022   Lab Results  Component Value Date   HGBA1C 6.3 (H) 03/14/2022   HGBA1C 5.8 05/23/2021   HGBA1C 6.0 (H) 10/06/2020   HGBA1C 5.5 07/28/2019   HGBA1C 5.4 12/08/2018   Lab Results  Component Value Date   INSULIN 41.6 (H) 03/14/2022   INSULIN 16.1 07/28/2019   INSULIN 7.4 12/08/2018   Lab Results  Component Value Date   TSH 1.655 12/20/2021   Lab Results  Component Value Date   CHOL 140 03/14/2022   HDL 46 03/14/2022   LDLCALC 80 03/14/2022   TRIG 68 03/14/2022   CHOLHDL 3.8 10/06/2020   Lab Results  Component Value Date   VD25OH 14.6 (L) 03/14/2022   VD25OH 14.8 (L) 01/23/2021   VD25OH 12.6 (L) 10/06/2020   Lab Results  Component Value Date   WBC 8.1 12/20/2021   HGB 12.5 12/20/2021   HCT 38.9 12/20/2021   MCV 85.1 12/20/2021   PLT 333 12/20/2021   Lab Results  Component Value Date   IRON 53 04/02/2019   TIBC 360 04/02/2019   FERRITIN 26 04/02/2019   Attestation Statements:   Reviewed by clinician on day of visit: allergies, medications, problem  list, medical history, surgical history, family history, social history, and previous encounter notes.  Time spent on visit including pre-visit chart review and post-visit care and charting was 40 minutes.   Wilhemena Durie, am acting as transcriptionist for Thomes Dinning, MD.  I have reviewed the above documentation for accuracy and completeness, and I agree with the above. -Thomes Dinning, MD

## 2022-04-04 ENCOUNTER — Other Ambulatory Visit (HOSPITAL_COMMUNITY): Payer: Self-pay

## 2022-04-12 ENCOUNTER — Ambulatory Visit: Payer: Self-pay | Admitting: Psychiatry

## 2022-04-16 ENCOUNTER — Ambulatory Visit: Payer: Self-pay | Admitting: Orthopaedic Surgery

## 2022-04-19 ENCOUNTER — Ambulatory Visit (INDEPENDENT_AMBULATORY_CARE_PROVIDER_SITE_OTHER): Payer: 59 | Admitting: Internal Medicine

## 2022-04-23 ENCOUNTER — Ambulatory Visit (INDEPENDENT_AMBULATORY_CARE_PROVIDER_SITE_OTHER): Payer: 59 | Admitting: Internal Medicine

## 2022-04-23 ENCOUNTER — Encounter (INDEPENDENT_AMBULATORY_CARE_PROVIDER_SITE_OTHER): Payer: Self-pay | Admitting: Internal Medicine

## 2022-04-23 ENCOUNTER — Other Ambulatory Visit (INDEPENDENT_AMBULATORY_CARE_PROVIDER_SITE_OTHER): Payer: Self-pay | Admitting: Internal Medicine

## 2022-04-23 VITALS — BP 129/79 | HR 83 | Temp 98.2°F | Ht 66.0 in | Wt 316.0 lb

## 2022-04-23 DIAGNOSIS — E559 Vitamin D deficiency, unspecified: Secondary | ICD-10-CM | POA: Diagnosis not present

## 2022-04-23 DIAGNOSIS — Z6841 Body Mass Index (BMI) 40.0 and over, adult: Secondary | ICD-10-CM | POA: Diagnosis not present

## 2022-04-23 DIAGNOSIS — E669 Obesity, unspecified: Secondary | ICD-10-CM | POA: Diagnosis not present

## 2022-04-23 DIAGNOSIS — R7303 Prediabetes: Secondary | ICD-10-CM | POA: Diagnosis not present

## 2022-04-23 MED ORDER — METFORMIN HCL ER 500 MG PO TB24: 500.0000 mg | ORAL_TABLET | Freq: Two times a day (BID) | ORAL | 0 refills | Status: DC

## 2022-04-23 NOTE — Assessment & Plan Note (Signed)
Most recent A1c is  Lab Results  Component Value Date   HGBA1C 6.3 (H) 03/14/2022   with associated elevated insulin levels.  Patient informed of disease state and risk of progression. This may contribute to abnormal cravings, fatigue and diabetes complications without having diabetes.   We reviewed treatment options which include weight loss of about 7 to 10% of body weight, increasing physical activity to 150 minutes a week of moderate intensity.  She will continue metformin twice a day for diabetes prevention and weight management.

## 2022-04-23 NOTE — Progress Notes (Signed)
Office: [phone]  /  Fax: [place] [facility] Height: 5' 6"$  (1.676 m) Weight: 316 lb (143.3 kg) Temp: 98.2 F (36.8 C) Pulse Rate: 83 BP: 129/79 SpO2: 98 % Fasting: n Labs: n Today's Visit #: 3 Weight at Last VIsit: 313 lb Weight Lost Since Last Visit: +3  Body Fat %: 52.4 % Fat Mass (lbs): 165.8 lbs Muscle Mass (lbs): 143 lbs Total Body Water (lbs): 110.2 lbs Visceral Fat Rating : 19 Starting Date: 03/14/22 Starting Weight: 321 lb Total Weight Loss (lbs): 8 lb (3.629 kg)    HPI  Chief Complaint: OBESITY  Jenna [name] is here to discuss her progress with her obesity treatment plan. She is on the the Category 3 Plan and states she is following her eating plan approximately 96 % of the time. She states she is exercising 5-10 minutes 2 times per week.   Interval History:  Since last office visit she gained 3 lbs. she reports adding food she shouldn't have. Traveled out of town for 9 days, had cake and pizza.  She is now back to following her plan has been working on waking up early to eat breakfast.  She has also been reading labels.  Denies problems with appetite and hunger signals.  Denies problems with satiety and satiation.  Denies problems with eating patterns and portion control.  Stress levels are reported as medium and due to Family.  Barriers identified none.    Pharmacotherapy: Metformin and topiramate for other primary causes.  PHYSICAL EXAM:  Blood pressure 129/79, pulse 83, temperature 98.2 F (36.8 C), height 5' 6"$  (1.676 m), weight (!) 316 lb (143.3 kg), SpO2 98 %. Body mass index is 51 kg/m.  General: She is overweight, cooperative, alert, well developed, and in no acute distress. PSYCH: Has normal mood, affect and thought process.   HEENT: EOMI, sclerae are anicteric. Lungs: Normal breathing effort, no conversational dyspnea. Extremities: No edema.  Neurologic: No gross sensory or motor deficits.  No tremors or fasciculations noted.    DIAGNOSTIC DATA REVIEWED:  BMET    Component Value Date/Time   NA 139 03/14/2022 1030   K 4.7 03/14/2022 1030   CL 106 03/14/2022 1030   CO2 20 03/14/2022 1030   GLUCOSE 100 (H) 03/14/2022 1030   GLUCOSE 118 (H) 12/20/2021 1430   BUN 10 03/14/2022 1030   CREATININE 0.86 03/14/2022 1030   CALCIUM 9.6 03/14/2022 1030   GFRNONAA 55 (L) 12/20/2021 1430   GFRAA >60 09/25/2019 1548   Lab Results  Component Value Date   HGBA1C 6.3 (H) 03/14/2022   HGBA1C 5.6 12/03/2017   Lab Results  Component Value Date   INSULIN 41.6 (H) 03/14/2022   INSULIN 7.4 12/08/2018   Lab Results  Component Value Date   TSH 1.655 12/20/2021   CBC    Component Value Date/Time   WBC 8.1 12/20/2021 1430   RBC 4.57 12/20/2021 1430   HGB 12.5 12/20/2021 1430   HGB 12.5 01/23/2021 1525   HCT 38.9 12/20/2021 1430   HCT 37.4 01/23/2021 1525   PLT 333 12/20/2021 1430   PLT 284 01/23/2021 1525   MCV 85.1 12/20/2021 1430   MCV 88 01/23/2021 1525   MCH 27.4 12/20/2021 1430   MCHC 32.1 12/20/2021 1430   RDW 14.6 12/20/2021 1430   RDW 15.1 01/23/2021 1525   Iron Studies    Component Value Date/Time   IRON 53 04/02/2019 1510   TIBC 360 04/02/2019 1510  FERRITIN 26 04/02/2019 1510   IRONPCTSAT 15 04/02/2019 1510   Lipid Panel     Component Value Date/Time   CHOL 140 03/14/2022 1030   TRIG 68 03/14/2022 1030   HDL 46 03/14/2022 1030   CHOLHDL 3.8 10/06/2020 1458   LDLCALC 80 03/14/2022 1030   Hepatic Function Panel     Component Value Date/Time   PROT 7.0 03/14/2022 1030   ALBUMIN 4.2 03/14/2022 1030   AST 17 03/14/2022 1030   ALT 17 03/14/2022 1030   ALKPHOS 78 03/14/2022 1030   BILITOT 0.4 03/14/2022 1030      Component Value Date/Time   TSH 1.655 12/20/2021 1430   TSH 1.430 01/23/2021 1525   Nutritional Lab Results  Component Value Date   VD25OH 14.6 (L) 03/14/2022   VD25OH 14.8 (L) 01/23/2021   VD25OH 12.6 (L) 10/06/2020      ASSESSMENT AND PLAN  TREATMENT PLAN FOR OBESITY:  Recommended Dietary Goals  Jenna [name] is currently in the action stage of change. As such, her goal is to continue weight management plan. She has agreed to the Category 3 Plan.  Behavioral Intervention  We discussed the following Behavioral Modification Strategies today: increasing lean protein intake, increasing vegetables, increase water intake, work on meal planning and easy cooking plans, think about ways to increase physical activity, reading food labels and making healthy choices when eating convenient foods, and avoiding temptations.  Additional resources provided today: NA  Recommended Physical Activity Goals  Jenna [name] has been advised to work up to 150 minutes of moderate intensity aerobic activity a week and strengthening exercises 2-3 times per week for cardiovascular health, weight loss maintenance and preservation of muscle mass.   She has agreed to increase physical activity in their day and reduce sedentary time (increase NEAT).    Pharmacotherapy We discussed various medication options to help Jenna [name] with her weight loss efforts and we both agreed to continue metformin 1 tablet twice a day..  ASSOCIATED CONDITIONS ADDRESSED TODAY  Vitamin D deficiency Assessment & Plan: Most recent vitamin D levels  Lab Results  Component Value Date   VD25OH 14.6 (L) 03/14/2022   VD25OH 14.8 (L) 01/23/2021   VD25OH 12.6 (L) 10/06/2020     Deficiency state associated with adiposity and may result in leptin resistance, weight gain and fatigue. Currently on vitamin D supplementation without any adverse effects.  Plan: Continue high-dose vitamin D supplementation.  Will check levels in April for goal level of 50-60.   Prediabetes Assessment & Plan: Most recent A1c is  Lab Results  Component Value Date   HGBA1C 6.3 (H) 03/14/2022   with associated elevated insulin levels.  Patient informed of disease state and risk  of progression. This may contribute to abnormal cravings, fatigue and diabetes complications without having diabetes.   We reviewed treatment options which include weight loss of about 7 to 10% of body weight, increasing physical activity to 150 minutes a week of moderate intensity.  She will continue metformin twice a day for diabetes prevention and weight management.   Orders: -     metFORMIN HCl ER; Take 1 tablet (500 mg total) by mouth 2 (two) times daily with a meal.  Dispense: 60 tablet; Refill: 0  Obesity with current BMI of 51.8      Return in about 2 weeks (around 05/07/2022) for For Weight Mangement with Dr. Gerarda Fraction.[name] She was informed of the importance of frequent follow up visits to maximize her success with intensive lifestyle modifications for her multiple  health conditions.   ATTESTASTION STATEMENTS:  Reviewed by clinician on day of visit: allergies, medications, problem list, medical history, surgical history, family history, social history, and previous encounter notes.      Thomes Dinning, MD

## 2022-04-23 NOTE — Assessment & Plan Note (Signed)
Most recent vitamin D levels  Lab Results  Component Value Date   VD25OH 14.6 (L) 03/14/2022   VD25OH 14.8 (L) 01/23/2021   VD25OH 12.6 (L) 10/06/2020     Deficiency state associated with adiposity and may result in leptin resistance, weight gain and fatigue. Currently on vitamin D supplementation without any adverse effects.  Plan: Continue high-dose vitamin D supplementation.  Will check levels in April for goal level of 50-60.

## 2022-05-02 ENCOUNTER — Ambulatory Visit: Payer: 59 | Admitting: Orthopaedic Surgery

## 2022-05-09 ENCOUNTER — Ambulatory Visit (INDEPENDENT_AMBULATORY_CARE_PROVIDER_SITE_OTHER): Payer: 59 | Admitting: Psychiatry

## 2022-05-09 ENCOUNTER — Encounter: Payer: Self-pay | Admitting: Psychiatry

## 2022-05-09 VITALS — BP 102/66 | HR 83 | Ht 65.0 in | Wt 313.5 lb

## 2022-05-09 DIAGNOSIS — M5412 Radiculopathy, cervical region: Secondary | ICD-10-CM

## 2022-05-09 DIAGNOSIS — G4486 Cervicogenic headache: Secondary | ICD-10-CM | POA: Diagnosis not present

## 2022-05-09 NOTE — Progress Notes (Signed)
   CC:  dizziness, headaches  Follow-up Visit  Last visit: 09/25/21  Brief HPI: 52 year old female with a history of osteoarthritis, cervical radiculopathy, RLS, depression who follows in clinic for dizziness and headaches. 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  At her last visit, brain MRI was ordered. She was referred to ophthalmology for blurred vision. Topamax was started for headache prevention.  Interval History: MRI brain 10/09/21 showed only 51m cerebellar ectopia, not enough to be considered Chiari malformation. 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  She saw ophthalmology in January and states her eye exam was normal. Vision has improved with updated prescription.   Her dizziness has resolved. Headache frequency has improved with Topamax, but she continues to have headaches when she bends over or laughs. They are not associated with photophobia, phonophobia, or nausea. They last for ~1 minute at a time. She is currently taking Topamax 75 mg QHS as she could not tolerate 100 mg QHS due to drowsiness.  Continues to have significant neck pain. Takes gabapentin, Cymbalta, and tizanidine which help somewhat, but they all make her drowsy.  Physical Exam:   Vital Signs: BP 102/66   Pulse 83   Ht 5' 5"$  (1.651 m)   Wt (!) 313 lb 8 oz (142.2 kg)   BMI 52.17 kg/m  GENERAL:  well appearing, in no acute distress, alert  SKIN:  Color, texture, turgor normal. No rashes or lesions HEAD:  Normocephalic/atraumatic. RESP: normal respiratory effort MSK:  No gross joint deformities. +tenderness to palpation over left occiput, neck, and shoulder  NEUROLOGICAL: Mental Status: Alert, oriented to person, place and time, Follows commands, and Speech fluent and appropriate. Cranial Nerves: PERRL, face symmetric, no dysarthria, hearing grossly  intact Motor: moves all extremities equally Gait: normal-based.  IMPRESSION: 52year old female with a history of osteoarthritis, cervical radiculopathy, RLS, depression who presents for follow up of headaches and dizziness. She has had some improvement in her headaches with Topamax, but continues to have intermittent headaches with laughing and bending forward. MRI brain was not consistent with Chiari malformation. Suspect her cervical radiculopathy is contributing to her headaches. She does plan to set an appointment up with orthopedics to discuss treatment options. Discussed options including occipital nerve block, PT, and medication, but she would prefer to see orthopedics prior to initiating any other treatments.  PLAN: -Continue Topamax 75 mg QHS -She will set an appointment up with orthopedics for cervical radiculopathy -Next steps: consider switching Topamax to zonisamide, consider neck PT, ON block  Follow-up: 8 months  I spent a total of 29 minutes on the date of the service. Discussed medication side effects, adverse reactions and drug interactions. Written educational materials and patient instructions outlining all of the above were given.  JGenia Harold MD

## 2022-05-10 ENCOUNTER — Encounter (INDEPENDENT_AMBULATORY_CARE_PROVIDER_SITE_OTHER): Payer: Self-pay | Admitting: Internal Medicine

## 2022-05-10 ENCOUNTER — Ambulatory Visit (INDEPENDENT_AMBULATORY_CARE_PROVIDER_SITE_OTHER): Payer: 59 | Admitting: Internal Medicine

## 2022-05-10 VITALS — BP 130/74 | HR 85 | Temp 98.1°F | Ht 65.0 in | Wt 308.0 lb

## 2022-05-10 DIAGNOSIS — E559 Vitamin D deficiency, unspecified: Secondary | ICD-10-CM

## 2022-05-10 DIAGNOSIS — E669 Obesity, unspecified: Secondary | ICD-10-CM

## 2022-05-10 DIAGNOSIS — Z6841 Body Mass Index (BMI) 40.0 and over, adult: Secondary | ICD-10-CM | POA: Diagnosis not present

## 2022-05-10 DIAGNOSIS — R7303 Prediabetes: Secondary | ICD-10-CM | POA: Diagnosis not present

## 2022-05-10 MED ORDER — METFORMIN HCL ER 500 MG PO TB24: 500.0000 mg | ORAL_TABLET | Freq: Two times a day (BID) | ORAL | 0 refills | Status: DC

## 2022-05-10 NOTE — Addendum Note (Signed)
Addended by: Glendale Chard on: 05/10/2022 01:31 PM   Modules accepted: Level of Service

## 2022-05-10 NOTE — Assessment & Plan Note (Signed)
Most recent A1c is  Lab Results  Component Value Date   HGBA1C 6.3 (H) 03/14/2022   with associated elevated insulin levels.  Patient informed of disease state and risk of progression. This may contribute to abnormal cravings, fatigue and diabetes complications without having diabetes.  She is currently on metformin twice a day and denies any adverse effects  Patient has been doing a great job reducing simple and added sugars in her diet eating more fruits and vegetables and continues to lose weight.  She will continue metformin twice a day for diabetes prevention and incretin effect.

## 2022-05-10 NOTE — Assessment & Plan Note (Signed)
Most recent vitamin D levels  Lab Results  Component Value Date   VD25OH 14.6 (L) 03/14/2022   VD25OH 14.8 (L) 01/23/2021   VD25OH 12.6 (L) 10/06/2020     Deficiency state associated with adiposity and may result in leptin resistance, weight gain and fatigue. Currently on vitamin D supplementation without any adverse effects.  Plan: Continue high-dose vitamin D supplementation with improved compliance.  Check levels in sometime in April.

## 2022-05-10 NOTE — Progress Notes (Signed)
Office: [phone]  /  Fax: [phone]  WEIGHT SUMMARY AND BIOMETRICS  Vitals Temp: 98.1 F (36.7 C) BP: 130/74 Pulse Rate: 85 SpO2: 99 %   Anthropometric Measurements Height: '5\' 5"'$  (1.651 m) Weight: (!) 308 lb (139.7 kg) BMI (Calculated): 51.25 Weight at Last Visit: 316 lb Weight Lost Since Last Visit: 8 Starting Weight: 8 lb Total Weight Loss (lbs): 160 lb (72.6 kg)   Body Composition  Body Fat %: 51.2 % Fat Mass (lbs): 158 lbs Muscle Mass (lbs): 142.8 lbs Total Body Water (lbs): 101.2 lbs Visceral Fat Rating : 19   Other Clinical Data Fasting: Yes Labs: No Today's Visit #: 4 Starting Date: 03/14/22    HPI  Chief Complaint: OBESITY  Jenna [name] is here to discuss her progress with her obesity treatment plan. She is on the the Category 3 Plan and states she is following her eating plan approximately 97 % of the time. She states she is getting 900 and 1000 steps 2 times per week.  Interval History:  Since last office visit she has lost 8 lbs. She reports excellent adherence to prescribed reduced calorie nutrition plan. She has been working on getting prescribed amount of protein and not skipping meal '[x]'$ Denies '[]'$ Reports problems with appetite and hunger signals.  '[]'$ Denies '[x]'$ Reports problems with satiety and satiation.  '[x]'$ Denies '[]'$ Reports problems with eating patterns and portion control.  '[x]'$ Denies '[]'$ Reports abnormal cravings Stress levels are reported as low and manageable.  Barriers identified none.   Pharmacotherapy for weight loss: She is currently taking Metformin (off label use for incretin effect and / or insulin resistance and / or diabetes prevention) .   Weight promoting medications identified: Antiepileptics.  ASSESSMENT AND PLAN  TREATMENT PLAN FOR OBESITY:  Recommended Dietary Goals  Jenna [name] is currently in the action stage of change. As such, her goal is to continue weight management plan. She has agreed to: the Category 3  Plan.  Behavioral Intervention  We discussed the following Behavioral Modification Strategies today: increasing lean protein intake, increasing vegetables, increasing water intake, and work on meal planning and easy cooking plans.  Additional resources provided today: NA  Recommended Physical Activity Goals  Jenna [name] has been advised to work up to 150 minutes of moderate intensity aerobic activity a week and strengthening exercises 2-3 times per week for cardiovascular health, weight loss maintenance and preservation of muscle mass.   She has agreed to : '[]'$  Continue current level of physical activity '[]'$  Start strengthening exercises with a goal of 2-3 sessions a week   '[]'$  Start aerobic activity with a goal of 150 minutes a week at moderate intensity. '[]'$ Increase intensity, frequency or duration of strengthening exercises '[x]'$ Increase the intensity, frequency or duration of aerobic exercises  '[]'$ Increase physical activity in their day and reduce sedentary time (increase NEAT). '[]'$  Work on scheduling and tracking physical activity.   Pharmacotherapy We discussed various medication options to help Jenna [name] with her weight loss efforts and we both agreed to : '[x]'$  Continue with nutritional and behavioral strategies '[]'$  Continue current antiobesity medication (AOM). '[]'$ Start AOM   ASSOCIATED CONDITIONS ADDRESSED TODAY  Vitamin D deficiency Assessment & Plan: Most recent vitamin D levels  Lab Results  Component Value Date   VD25OH 14.6 (L) 03/14/2022   VD25OH 14.8 (L) 01/23/2021   VD25OH 12.6 (L) 10/06/2020     Deficiency state associated with adiposity and may result in leptin resistance, weight gain and fatigue. Currently on vitamin D supplementation without any adverse effects.  Plan: Continue high-dose  vitamin D supplementation with improved compliance.  Check levels in sometime in April.    Prediabetes Assessment & Plan: Most recent A1c is  Lab Results  Component Value Date   HGBA1C  6.3 (H) 03/14/2022   with associated elevated insulin levels.  Patient informed of disease state and risk of progression. This may contribute to abnormal cravings, fatigue and diabetes complications without having diabetes.  She is currently on metformin twice a day and denies any adverse effects  Patient has been doing a great job reducing simple and added sugars in her diet eating more fruits and vegetables and continues to lose weight.  She will continue metformin twice a day for diabetes prevention and incretin effect.   Orders: -     metFORMIN HCl ER; Take 1 tablet (500 mg total) by mouth 2 (two) times daily with a meal.  Dispense: 60 tablet; Refill: 0  Obesity with current BMI of 51.8     PHYSICAL EXAM:  Blood pressure 130/74, pulse 85, temperature 98.1 F (36.7 C), height '5\' 5"'$  (1.651 m), weight (!) 308 lb (139.7 kg), SpO2 99 %. Body mass index is 51.25 kg/m.  General: She is overweight, cooperative, alert, well developed, and in no acute distress. PSYCH: Has normal mood, affect and thought process.   HEENT: EOMI, sclerae are anicteric. Lungs: Normal breathing effort, no conversational dyspnea. Extremities: No edema.  Neurologic: No gross sensory or motor deficits. No tremors or fasciculations noted.    DIAGNOSTIC DATA REVIEWED:  BMET    Component Value Date/Time   NA 139 03/14/2022 1030   K 4.7 03/14/2022 1030   CL 106 03/14/2022 1030   CO2 20 03/14/2022 1030   GLUCOSE 100 (H) 03/14/2022 1030   GLUCOSE 118 (H) 12/20/2021 1430   BUN 10 03/14/2022 1030   CREATININE 0.86 03/14/2022 1030   CALCIUM 9.6 03/14/2022 1030   GFRNONAA 55 (L) 12/20/2021 1430   GFRAA >60 09/25/2019 1548   Lab Results  Component Value Date   HGBA1C 6.3 (H) 03/14/2022   HGBA1C 5.6 12/03/2017   Lab Results  Component Value Date   INSULIN 41.6 (H) 03/14/2022   INSULIN 7.4 12/08/2018   Lab Results  Component Value Date   TSH 1.655 12/20/2021   CBC    Component Value Date/Time   WBC  8.1 12/20/2021 1430   RBC 4.57 12/20/2021 1430   HGB 12.5 12/20/2021 1430   HGB 12.5 01/23/2021 1525   HCT 38.9 12/20/2021 1430   HCT 37.4 01/23/2021 1525   PLT 333 12/20/2021 1430   PLT 284 01/23/2021 1525   MCV 85.1 12/20/2021 1430   MCV 88 01/23/2021 1525   MCH 27.4 12/20/2021 1430   MCHC 32.1 12/20/2021 1430   RDW 14.6 12/20/2021 1430   RDW 15.1 01/23/2021 1525   Iron Studies    Component Value Date/Time   IRON 53 04/02/2019 1510   TIBC 360 04/02/2019 1510   FERRITIN 26 04/02/2019 1510   IRONPCTSAT 15 04/02/2019 1510   Lipid Panel     Component Value Date/Time   CHOL 140 03/14/2022 1030   TRIG 68 03/14/2022 1030   HDL 46 03/14/2022 1030   CHOLHDL 3.8 10/06/2020 1458   LDLCALC 80 03/14/2022 1030   Hepatic Function Panel     Component Value Date/Time   PROT 7.0 03/14/2022 1030   ALBUMIN 4.2 03/14/2022 1030   AST 17 03/14/2022 1030   ALT 17 03/14/2022 1030   ALKPHOS 78 03/14/2022 1030   BILITOT 0.4 03/14/2022  1030      Component Value Date/Time   TSH 1.655 12/20/2021 1430   TSH 1.430 01/23/2021 1525   Nutritional Lab Results  Component Value Date   VD25OH 14.6 (L) 03/14/2022   VD25OH 14.8 (L) 01/23/2021   VD25OH 12.6 (L) 10/06/2020     Return in about 2 weeks (around 05/24/2022) for For Weight Mangement with Dr. Gerarda Fraction.[name] She was informed of the importance of frequent follow up visits to maximize her success with intensive lifestyle modifications for her multiple health conditions.   ATTESTASTION STATEMENTS:  Reviewed by clinician on day of visit: allergies, medications, problem list, medical history, surgical history, family history, social history, and previous encounter notes.      Thomes Dinning, MD

## 2022-05-16 ENCOUNTER — Other Ambulatory Visit (INDEPENDENT_AMBULATORY_CARE_PROVIDER_SITE_OTHER): Payer: Self-pay | Admitting: Internal Medicine

## 2022-05-16 DIAGNOSIS — R7303 Prediabetes: Secondary | ICD-10-CM

## 2022-05-21 ENCOUNTER — Other Ambulatory Visit: Payer: Self-pay

## 2022-05-21 ENCOUNTER — Other Ambulatory Visit: Payer: Self-pay | Admitting: Internal Medicine

## 2022-05-21 DIAGNOSIS — F32A Depression, unspecified: Secondary | ICD-10-CM

## 2022-05-21 DIAGNOSIS — M545 Low back pain, unspecified: Secondary | ICD-10-CM

## 2022-05-21 MED ORDER — DULOXETINE HCL 60 MG PO CPEP
60.0000 mg | ORAL_CAPSULE | Freq: Every day | ORAL | 0 refills | Status: DC
  Filled 2022-05-21 – 2022-05-28 (×2): qty 30, 30d supply, fill #0

## 2022-05-28 ENCOUNTER — Other Ambulatory Visit: Payer: Self-pay

## 2022-05-28 ENCOUNTER — Encounter (INDEPENDENT_AMBULATORY_CARE_PROVIDER_SITE_OTHER): Payer: Self-pay | Admitting: Internal Medicine

## 2022-05-28 ENCOUNTER — Ambulatory Visit (INDEPENDENT_AMBULATORY_CARE_PROVIDER_SITE_OTHER): Payer: 59 | Admitting: Internal Medicine

## 2022-05-28 VITALS — BP 132/83 | HR 83 | Temp 98.3°F | Ht 65.0 in | Wt 304.0 lb

## 2022-05-28 DIAGNOSIS — E669 Obesity, unspecified: Secondary | ICD-10-CM

## 2022-05-28 DIAGNOSIS — R7303 Prediabetes: Secondary | ICD-10-CM

## 2022-05-28 DIAGNOSIS — Z6841 Body Mass Index (BMI) 40.0 and over, adult: Secondary | ICD-10-CM | POA: Diagnosis not present

## 2022-05-28 MED ORDER — METFORMIN HCL ER 500 MG PO TB24: 500.0000 mg | ORAL_TABLET | Freq: Two times a day (BID) | ORAL | 0 refills | Status: DC

## 2022-05-28 NOTE — Assessment & Plan Note (Signed)
Most recent A1c is  Lab Results  Component Value Date   HGBA1C 6.3 (H) 03/14/2022   with associated elevated insulin levels.  Patient informed of disease state and risk of progression. This may contribute to abnormal cravings, fatigue and diabetes complications without having diabetes.  She is currently on metformin twice a day and denies any adverse effects  Patient has been doing a great job reducing simple and added sugars in her diet eating more fruits and vegetables and continues to lose weight.  She will continue metformin twice a day for diabetes prevention and incretin effect.  

## 2022-05-28 NOTE — Progress Notes (Signed)
Office: [phone]  /  Fax: [phone]  WEIGHT SUMMARY AND BIOMETRICS  Vitals Temp: 98.3 F (36.8 C) BP: 132/83 Pulse Rate: 83 SpO2: 99 %   Anthropometric Measurements Height: 5\' 5"  (1.651 m) Weight: (!) 304 lb (137.9 kg) BMI (Calculated): 50.59 Weight at Last Visit: 308 lb Weight Lost Since Last Visit: 4 lb Starting Weight: 321 lb Total Weight Loss (lbs): 17 lb (7.711 kg)   Body Composition  Body Fat %: 53.5 % Fat Mass (lbs): 162.4 lbs Muscle Mass (lbs): 142.2 lbs Total Body Water (lbs): 100 lbs Visceral Fat Rating : 20    HPI  Chief Complaint: OBESITY  Jenna [name] is here to discuss her progress with her obesity treatment plan. She is on the the Category 3 Plan and states she is following her eating plan approximately 93 % of the time. She states she is exercising 30 minutes 2-3 times per week.  Interval History:  Since last office visit she has lost 4 lbs. She reports good adherence to reduced calorie nutritional plan. She has been working on reading food labels, not skipping meals, increasing protein at every meal, drinking more water, avoiding and or reducing liquid calories, journaling and tracking calories, and making healthier choices [x] Denies [] Reports problems with appetite and hunger signals.  [x] Denies [] Reports problems with satiety and satiation.  [x] Denies [] Reports problems with eating patterns and portion control.  [x] Denies [] Reports abnormal cravings Barriers identified none and other:soft foods due to dental surgery .  Increasing neat. Pharmacotherapy for weight loss: She is currently taking Metformin (off label use for incretin effect and / or insulin resistance and / or diabetes prevention) .    ASSESSMENT AND PLAN  TREATMENT PLAN FOR OBESITY:  Recommended Dietary Goals  Jenna [name] is currently in the action stage of change. As such, her goal is to continue weight management plan. She has agreed to: the Category 3 Plan.  Behavioral  Intervention  We discussed the following Behavioral Modification Strategies today: increasing lean protein intake, increasing vegetables, increasing water intake, and work on meal planning and easy cooking plans.  Additional resources provided today: None  Recommended Physical Activity Goals  Jenna [name] has been advised to work up to 150 minutes of moderate intensity aerobic activity a week and strengthening exercises 2-3 times per week for cardiovascular health, weight loss maintenance and preservation of muscle mass.   She has agreed to :  Think about ways to increase physical activity  Pharmacotherapy We discussed various medication options to help Jenna [name] with her weight loss efforts and we both agreed to : continue current anti-obesity medication regimen  ASSOCIATED CONDITIONS ADDRESSED TODAY  Obesity with current BMI of 50  Prediabetes Assessment & Plan: Most recent A1c is  Lab Results  Component Value Date   HGBA1C 6.3 (H) 03/14/2022   with associated elevated insulin levels.  Patient informed of disease state and risk of progression. This may contribute to abnormal cravings, fatigue and diabetes complications without having diabetes.  She is currently on metformin twice a day and denies any adverse effects  Patient has been doing a great job reducing simple and added sugars in her diet eating more fruits and vegetables and continues to lose weight.  She will continue metformin twice a day for diabetes prevention and incretin effect.   Orders: -     metFORMIN HCl ER; Take 1 tablet (500 mg total) by mouth 2 (two) times daily with a meal.  Dispense: 60 tablet; Refill: 0  PHYSICAL EXAM:  Blood pressure 132/83, pulse 83, temperature 98.3 F (36.8 C), height 5\' 5"  (1.651 m), weight (!) 304 lb (137.9 kg), SpO2 99 %. Body mass index is 50.59 kg/m.  General: She is overweight, cooperative, alert, well developed, and in no acute distress. PSYCH: Has normal mood, affect  and thought process.   HEENT: EOMI, sclerae are anicteric. Lungs: Normal breathing effort, no conversational dyspnea. Extremities: No edema.  Neurologic: No gross sensory or motor deficits. No tremors or fasciculations noted.    DIAGNOSTIC DATA REVIEWED:  BMET    Component Value Date/Time   NA 139 03/14/2022 1030   K 4.7 03/14/2022 1030   CL 106 03/14/2022 1030   CO2 20 03/14/2022 1030   GLUCOSE 100 (H) 03/14/2022 1030   GLUCOSE 118 (H) 12/20/2021 1430   BUN 10 03/14/2022 1030   CREATININE 0.86 03/14/2022 1030   CALCIUM 9.6 03/14/2022 1030   GFRNONAA 55 (L) 12/20/2021 1430   GFRAA >60 09/25/2019 1548   Lab Results  Component Value Date   HGBA1C 6.3 (H) 03/14/2022   HGBA1C 5.6 12/03/2017   Lab Results  Component Value Date   INSULIN 41.6 (H) 03/14/2022   INSULIN 7.4 12/08/2018   Lab Results  Component Value Date   TSH 1.655 12/20/2021   CBC    Component Value Date/Time   WBC 8.1 12/20/2021 1430   RBC 4.57 12/20/2021 1430   HGB 12.5 12/20/2021 1430   HGB 12.5 01/23/2021 1525   HCT 38.9 12/20/2021 1430   HCT 37.4 01/23/2021 1525   PLT 333 12/20/2021 1430   PLT 284 01/23/2021 1525   MCV 85.1 12/20/2021 1430   MCV 88 01/23/2021 1525   MCH 27.4 12/20/2021 1430   MCHC 32.1 12/20/2021 1430   RDW 14.6 12/20/2021 1430   RDW 15.1 01/23/2021 1525   Iron Studies    Component Value Date/Time   IRON 53 04/02/2019 1510   TIBC 360 04/02/2019 1510   FERRITIN 26 04/02/2019 1510   IRONPCTSAT 15 04/02/2019 1510   Lipid Panel     Component Value Date/Time   CHOL 140 03/14/2022 1030   TRIG 68 03/14/2022 1030   HDL 46 03/14/2022 1030   CHOLHDL 3.8 10/06/2020 1458   LDLCALC 80 03/14/2022 1030   Hepatic Function Panel     Component Value Date/Time   PROT 7.0 03/14/2022 1030   ALBUMIN 4.2 03/14/2022 1030   AST 17 03/14/2022 1030   ALT 17 03/14/2022 1030   ALKPHOS 78 03/14/2022 1030   BILITOT 0.4 03/14/2022 1030      Component Value Date/Time   TSH 1.655  12/20/2021 1430   TSH 1.430 01/23/2021 1525   Nutritional Lab Results  Component Value Date   VD25OH 14.6 (L) 03/14/2022   VD25OH 14.8 (L) 01/23/2021   VD25OH 12.6 (L) 10/06/2020     Return in about 2 weeks (around 06/11/2022) for For Weight Mangement with Dr. Gerarda Fraction.[name] She was informed of the importance of frequent follow up visits to maximize her success with intensive lifestyle modifications for her multiple health conditions.   ATTESTASTION STATEMENTS:  Reviewed by clinician on day of visit: allergies, medications, problem list, medical history, surgical history, family history, social history, and previous encounter notes.     Thomes Dinning, MD

## 2022-06-13 ENCOUNTER — Other Ambulatory Visit: Payer: Self-pay

## 2022-06-13 DIAGNOSIS — Z1211 Encounter for screening for malignant neoplasm of colon: Secondary | ICD-10-CM

## 2022-06-21 ENCOUNTER — Other Ambulatory Visit: Payer: Self-pay

## 2022-06-21 ENCOUNTER — Ambulatory Visit (INDEPENDENT_AMBULATORY_CARE_PROVIDER_SITE_OTHER): Payer: 59 | Admitting: Internal Medicine

## 2022-06-21 ENCOUNTER — Other Ambulatory Visit: Payer: Self-pay | Admitting: Internal Medicine

## 2022-06-21 DIAGNOSIS — F32A Depression, unspecified: Secondary | ICD-10-CM

## 2022-06-21 DIAGNOSIS — M545 Low back pain, unspecified: Secondary | ICD-10-CM

## 2022-06-26 ENCOUNTER — Encounter (INDEPENDENT_AMBULATORY_CARE_PROVIDER_SITE_OTHER): Payer: Self-pay | Admitting: Internal Medicine

## 2022-06-26 ENCOUNTER — Ambulatory Visit (INDEPENDENT_AMBULATORY_CARE_PROVIDER_SITE_OTHER): Payer: 59 | Admitting: Internal Medicine

## 2022-06-26 VITALS — BP 138/84 | HR 96 | Temp 98.3°F | Ht 65.0 in | Wt 303.0 lb

## 2022-06-26 DIAGNOSIS — E559 Vitamin D deficiency, unspecified: Secondary | ICD-10-CM | POA: Diagnosis not present

## 2022-06-26 DIAGNOSIS — Z6841 Body Mass Index (BMI) 40.0 and over, adult: Secondary | ICD-10-CM

## 2022-06-26 DIAGNOSIS — E669 Obesity, unspecified: Secondary | ICD-10-CM | POA: Diagnosis not present

## 2022-06-26 DIAGNOSIS — R7303 Prediabetes: Secondary | ICD-10-CM

## 2022-06-26 NOTE — Assessment & Plan Note (Signed)
Most recent vitamin D levels  Lab Results  Component Value Date   VD25OH 14.6 (L) 03/14/2022   VD25OH 14.8 (L) 01/23/2021   VD25OH 12.6 (L) 10/06/2020     Deficiency state associated with adiposity and may result in leptin resistance, weight gain and fatigue. Currently on vitamin D supplementation without any adverse effects.  Plan: Continue high-dose vitamin D supplementation with improved compliance.  Check levels in sometime in May

## 2022-06-26 NOTE — Assessment & Plan Note (Signed)
Most recent A1c is  Lab Results  Component Value Date   HGBA1C 6.3 (H) 03/14/2022   with associated elevated insulin levels.  Patient informed of disease state and risk of progression. This may contribute to abnormal cravings, fatigue and diabetes complications without having diabetes.  She is currently on metformin twice a day and denies any adverse effects  Patient has been doing a great job reducing simple and added sugars in her diet eating more fruits and vegetables and continues to lose weight.  She will continue metformin twice a day for diabetes prevention and incretin effect.  

## 2022-06-26 NOTE — Progress Notes (Signed)
5'5  Office: [phone]  /  Fax: [phone]  WEIGHT SUMMARY AND BIOMETRICS  Vitals Temp: 98.3 F (36.8 C) BP: 138/84 Pulse Rate: 96 SpO2: 100 %   Anthropometric Measurements Height:  (1.651 m) Weight: (!) 303 lb (137.4 kg) BMI (Calculated): 50.42 Weight at Last Visit: 304 lb Weight Lost Since Last Visit: 1 lb Starting Weight: 321 lb Total Weight Loss (lbs): 18 lb (8.165 kg)   Body Composition  Body Fat %: 51.5 % Fat Mass (lbs): 156.4 lbs Muscle Mass (lbs): 140 lbs Total Body Water (lbs): 108 lbs Visceral Fat Rating : 19    Starting Date: 03/14/22  Today's Visit #: 6  No data recorded  HPI  Chief Complaint: OBESITY  Jenna [name] is here to discuss her progress with her obesity treatment plan. She is on the the Category 3 Plan and states she is following her eating plan approximately 98 % of the time. She states she is exercising 14 minutes 3 times per week.  Interval History:  Since last office visit she has 1 lb. Increased walwking She reports good adherence to reduced calorie nutritional plan. She has been working on reading food labels, not skipping meals, increasing protein intake at every meal, eating more vegetables, and drinking more water Denies problems with appetite and hunger signals.  Denies problems with satiety and satiation.  Denies problems with eating patterns and portion control.  Denies abnormal cravings. Denies feeling deprived or restricted.  Had dental work which limits solids. Not eating enough protein as a result   Barriers identified: none.   Pharmacotherapy for weight loss: She is currently taking no anti-obesity medication.    ASSESSMENT AND PLAN  TREATMENT PLAN FOR OBESITY:  Recommended Dietary Goals  Jenna [name] is currently in the action stage of change. As such, her goal is to continue weight management plan. She has agreed to: continue current plan  Behavioral Intervention  We discussed the following Behavioral  Modification Strategies today: increasing lean protein intake, decreasing simple carbohydrates , increasing vegetables, increasing lower glycemic fruits, increasing fiber rich foods, avoiding skipping meals, and increasing water intake.  Additional resources provided today: None  Recommended Physical Activity Goals  [name] has been advised to work up to 150 minutes of moderate intensity aerobic activity a week and strengthening exercises 2-3 times per week for cardiovascular health, weight loss maintenance and preservation of muscle mass.   She has agreed to :  Think about ways to increase physical activity  Pharmacotherapy We discussed various medication options to help [name] with her weight loss efforts and we both agreed to : continue with nutritional and behavioral strategies  ASSOCIATED CONDITIONS ADDRESSED TODAY  Obesity with current BMI of 50 Assessment & Plan: Her decrease in weight loss rate is likely secondary to changes in nutritional pattern following dental surgery.  She has had to transition to a soft diet which has had more sugar and has not been getting the prescribed amount of protein for appetite suppression and protein synthesis.  She will work on increasing protein intake.   Prediabetes Assessment & Plan: Most recent A1c is  Lab Results  Component Value Date   HGBA1C 6.3 (H) 03/14/2022   with associated elevated insulin levels.  Patient informed of disease state and risk of progression. This may contribute to abnormal cravings, fatigue and diabetes complications without having diabetes.  She is currently on metformin twice a day and denies any adverse effects  Patient has been doing a great job reducing simple and added  sugars in her diet eating more fruits and vegetables and continues to lose weight.  She will continue metformin twice a day for diabetes prevention and incretin effect.    Vitamin D deficiency Assessment & Plan: Most recent vitamin D levels   Lab Results  Component Value Date   VD25OH 14.6 (L) 03/14/2022   VD25OH 14.8 (L) 01/23/2021   VD25OH 12.6 (L) 10/06/2020     Deficiency state associated with adiposity and may result in leptin resistance, weight gain and fatigue. Currently on vitamin D supplementation without any adverse effects.  Plan: Continue high-dose vitamin D supplementation with improved compliance.  Check levels in sometime in May       PHYSICAL EXAM:  Blood pressure 138/84, pulse 96, temperature 98.3 F (36.8 C), height 5\' 5"  (1.651 m), weight (!) 303 lb (137.4 kg), SpO2 100 %. Body mass index is 50.42 kg/m.  General: She is overweight, cooperative, alert, well developed, and in no acute distress. PSYCH: Has normal mood, affect and thought process.   HEENT: EOMI, sclerae are anicteric. Lungs: Normal breathing effort, no conversational dyspnea. Extremities: No edema.  Neurologic: No gross sensory or motor deficits. No tremors or fasciculations noted.    DIAGNOSTIC DATA REVIEWED:  BMET    Component Value Date/Time   NA 139 03/14/2022 1030   K 4.7 03/14/2022 1030   CL 106 03/14/2022 1030   CO2 20 03/14/2022 1030   GLUCOSE 100 (H) 03/14/2022 1030   GLUCOSE 118 (H) 12/20/2021 1430   BUN 10 03/14/2022 1030   CREATININE 0.86 03/14/2022 1030   CALCIUM 9.6 03/14/2022 1030   GFRNONAA 55 (L) 12/20/2021 1430   GFRAA >60 09/25/2019 1548   Lab Results  Component Value Date   HGBA1C 6.3 (H) 03/14/2022   HGBA1C 5.6 12/03/2017   Lab Results  Component Value Date   INSULIN 41.6 (H) 03/14/2022   INSULIN 7.4 12/08/2018   Lab Results  Component Value Date   TSH 1.655 12/20/2021   CBC    Component Value Date/Time   WBC 8.1 12/20/2021 1430   RBC 4.57 12/20/2021 1430   HGB 12.5 12/20/2021 1430   HGB 12.5 01/23/2021 1525   HCT 38.9 12/20/2021 1430   HCT 37.4 01/23/2021 1525   PLT 333 12/20/2021 1430   PLT 284 01/23/2021 1525   MCV 85.1 12/20/2021 1430   MCV 88 01/23/2021 1525   MCH 27.4  12/20/2021 1430   MCHC 32.1 12/20/2021 1430   RDW 14.6 12/20/2021 1430   RDW 15.1 01/23/2021 1525   Iron Studies    Component Value Date/Time   IRON 53 04/02/2019 1510   TIBC 360 04/02/2019 1510   FERRITIN 26 04/02/2019 1510   IRONPCTSAT 15 04/02/2019 1510   Lipid Panel     Component Value Date/Time   CHOL 140 03/14/2022 1030   TRIG 68 03/14/2022 1030   HDL 46 03/14/2022 1030   CHOLHDL 3.8 10/06/2020 1458   LDLCALC 80 03/14/2022 1030   Hepatic Function Panel     Component Value Date/Time   PROT 7.0 03/14/2022 1030   ALBUMIN 4.2 03/14/2022 1030   AST 17 03/14/2022 1030   ALT 17 03/14/2022 1030   ALKPHOS 78 03/14/2022 1030   BILITOT 0.4 03/14/2022 1030      Component Value Date/Time   TSH 1.655 12/20/2021 1430   TSH 1.430 01/23/2021 1525   Nutritional Lab Results  Component Value Date   VD25OH 14.6 (L) 03/14/2022   VD25OH 14.8 (L) 01/23/2021   VD25OH 12.6 (  L) 10/06/2020     Return in about 2 weeks (around 07/10/2022) for For Weight Mangement with Dr. Rikki Spearing.[name] She was informed of the importance of frequent follow up visits to maximize her success with intensive lifestyle modifications for her multiple health conditions.   ATTESTASTION STATEMENTS:  Reviewed by clinician on day of visit: allergies, medications, problem list, medical history, surgical history, family history, social history, and previous encounter notes.     Worthy Rancher, MD

## 2022-06-26 NOTE — Assessment & Plan Note (Signed)
Her decrease in weight loss rate is likely secondary to changes in nutritional pattern following dental surgery.  She has had to transition to a soft diet which has had more sugar and has not been getting the prescribed amount of protein for appetite suppression and protein synthesis.  She will work on increasing protein intake.

## 2022-07-02 ENCOUNTER — Other Ambulatory Visit (HOSPITAL_COMMUNITY): Payer: Self-pay

## 2022-07-10 ENCOUNTER — Other Ambulatory Visit (INDEPENDENT_AMBULATORY_CARE_PROVIDER_SITE_OTHER): Payer: Self-pay | Admitting: Internal Medicine

## 2022-07-10 ENCOUNTER — Other Ambulatory Visit: Payer: Self-pay | Admitting: Internal Medicine

## 2022-07-10 ENCOUNTER — Ambulatory Visit (AMBULATORY_SURGERY_CENTER): Payer: 59

## 2022-07-10 VITALS — Ht 65.0 in | Wt 303.0 lb

## 2022-07-10 DIAGNOSIS — Z1211 Encounter for screening for malignant neoplasm of colon: Secondary | ICD-10-CM

## 2022-07-10 DIAGNOSIS — R7303 Prediabetes: Secondary | ICD-10-CM

## 2022-07-10 DIAGNOSIS — M545 Low back pain, unspecified: Secondary | ICD-10-CM

## 2022-07-10 DIAGNOSIS — F32A Depression, unspecified: Secondary | ICD-10-CM

## 2022-07-10 MED ORDER — NA SULFATE-K SULFATE-MG SULF 17.5-3.13-1.6 GM/177ML PO SOLN
1.0000 | Freq: Once | ORAL | 0 refills | Status: AC
Start: 2022-07-10 — End: 2022-07-10

## 2022-07-10 NOTE — Progress Notes (Signed)

## 2022-07-11 NOTE — Telephone Encounter (Signed)
Patient not taking:   Reported on 07/10/2022    Route:   Oral    Note to Pharmacy:   Must have office visit for refills      Requested Prescriptions  Pending Prescriptions Disp Refills   DULoxetine (CYMBALTA) 60 MG capsule 30 capsule 0    Sig: Take 1 capsule (60 mg total) by mouth daily for back pain and mood.     Psychiatry: Antidepressants - SNRI - duloxetine Failed - 07/10/2022  1:47 PM      Failed - Valid encounter within last 6 months    Recent Outpatient Visits           5 months ago Morbid obesity [facility])   [facility] Marcine Matar, MD   11 months ago Cervical radiculopathy   [facility] Jonah Blue B, MD   1 year ago Pap smear for cervical cancer screening    [facility] Marcine Matar, MD   1 year ago Morbid obesity [facility])    [facility] Marcine Matar, MD   1 year ago Elevated blood-pressure reading without diagnosis of hypertension   [facility] [place], Cornelius Moras, RPH-CPP              Passed - Cr in normal range and within 360 days    Creatinine, Ser  Date Value Ref Range Status  03/14/2022 0.86 0.57 - 1.00 mg/dL Final         Passed - eGFR is 30 or above and within 360 days    GFR calc Af Amer  Date Value Ref Range Status  09/25/2019 >60 >60 mL/min Final   GFR, Estimated  Date Value Ref Range Status  12/20/2021 55 (L) >60 mL/min Final    Comment:    (NOTE) Calculated using the CKD-EPI Creatinine Equation (2021)    eGFR  Date Value Ref Range Status  03/14/2022 82 >59 mL/min/1.73 Final         Passed - Completed PHQ-2 or PHQ-9 in the last 360 days      Passed - Last BP in normal range    BP Readings from Last 1 Encounters:  06/26/22 138/84

## 2022-07-18 NOTE — Anesthesia Preprocedure Evaluation (Signed)
Anesthesia Evaluation  Patient identified by MRN, date of birth, ID band Patient awake    Reviewed: Allergy & Precautions, H&P , NPO status , Patient's Chart, lab work & pertinent test results  Airway Mallampati: II  TM Distance: >3 FB Neck ROM: Full    Dental no notable dental hx. (+) Edentulous Upper, Dental Advisory Given   Pulmonary neg pulmonary ROS, former smoker   Pulmonary exam normal breath sounds clear to auscultation       Cardiovascular Exercise Tolerance: Good negative cardio ROS  Rhythm:Regular Rate:Normal     Neuro/Psych    Depression    negative neurological ROS     GI/Hepatic negative GI ROS, Neg liver ROS,,,  Endo/Other    Morbid obesity  Renal/GU negative Renal ROS  negative genitourinary   Musculoskeletal  (+) Arthritis , Osteoarthritis,    Abdominal   Peds  Hematology negative hematology ROS (+)   Anesthesia Other Findings   Reproductive/Obstetrics negative OB ROS                             Anesthesia Physical Anesthesia Plan  ASA: 3  Anesthesia Plan: MAC   Post-op Pain Management: Minimal or no pain anticipated   Induction: Intravenous  PONV Risk Score and Plan: 2 and Propofol infusion  Airway Management Planned: Natural Airway and Simple Face Mask  Additional Equipment:   Intra-op Plan:   Post-operative Plan:   Informed Consent: I have reviewed the patients History and Physical, chart, labs and discussed the procedure including the risks, benefits and alternatives for the proposed anesthesia with the patient or authorized representative who has indicated his/her understanding and acceptance.     Dental advisory given  Plan Discussed with: CRNA  Anesthesia Plan Comments:        Anesthesia Quick Evaluation

## 2022-07-19 ENCOUNTER — Encounter (HOSPITAL_COMMUNITY): Payer: Self-pay | Admitting: Gastroenterology

## 2022-07-19 ENCOUNTER — Ambulatory Visit (HOSPITAL_COMMUNITY)
Admission: RE | Admit: 2022-07-19 | Discharge: 2022-07-19 | Disposition: A | Discharge status: Home / Self Care | Payer: 59 | Attending: Gastroenterology | Admitting: Gastroenterology

## 2022-07-19 ENCOUNTER — Encounter (HOSPITAL_COMMUNITY)
Admission: RE | Disposition: A | Discharge status: Home / Self Care | Payer: Self-pay | Source: Home / Self Care | Attending: Gastroenterology

## 2022-07-19 ENCOUNTER — Ambulatory Visit (HOSPITAL_COMMUNITY): Payer: 59 | Admitting: Anesthesiology

## 2022-07-19 ENCOUNTER — Ambulatory Visit (HOSPITAL_BASED_OUTPATIENT_CLINIC_OR_DEPARTMENT_OTHER): Payer: 59 | Admitting: Anesthesiology

## 2022-07-19 ENCOUNTER — Other Ambulatory Visit: Payer: Self-pay

## 2022-07-19 DIAGNOSIS — D125 Benign neoplasm of sigmoid colon: Secondary | ICD-10-CM | POA: Diagnosis not present

## 2022-07-19 DIAGNOSIS — D128 Benign neoplasm of rectum: Secondary | ICD-10-CM | POA: Diagnosis not present

## 2022-07-19 DIAGNOSIS — D124 Benign neoplasm of descending colon: Secondary | ICD-10-CM | POA: Diagnosis not present

## 2022-07-19 DIAGNOSIS — K635 Polyp of colon: Secondary | ICD-10-CM

## 2022-07-19 DIAGNOSIS — R195 Other fecal abnormalities: Secondary | ICD-10-CM | POA: Diagnosis present

## 2022-07-19 DIAGNOSIS — Z6841 Body Mass Index (BMI) 40.0 and over, adult: Secondary | ICD-10-CM | POA: Insufficient documentation

## 2022-07-19 DIAGNOSIS — D122 Benign neoplasm of ascending colon: Secondary | ICD-10-CM

## 2022-07-19 DIAGNOSIS — D126 Benign neoplasm of colon, unspecified: Secondary | ICD-10-CM | POA: Diagnosis not present

## 2022-07-19 DIAGNOSIS — Z1211 Encounter for screening for malignant neoplasm of colon: Secondary | ICD-10-CM | POA: Diagnosis not present

## 2022-07-19 DIAGNOSIS — Z09 Encounter for follow-up examination after completed treatment for conditions other than malignant neoplasm: Secondary | ICD-10-CM | POA: Insufficient documentation

## 2022-07-19 DIAGNOSIS — Z87891 Personal history of nicotine dependence: Secondary | ICD-10-CM | POA: Diagnosis not present

## 2022-07-19 HISTORY — PX: COLONOSCOPY WITH PROPOFOL: SHX5780

## 2022-07-19 HISTORY — PX: POLYPECTOMY: SHX5525

## 2022-07-19 HISTORY — PX: SUBMUCOSAL LIFTING INJECTION: SHX6855

## 2022-07-19 LAB — GLUCOSE, CAPILLARY: Glucose-Capillary: 108 mg/dL — ABNORMAL HIGH (ref 70–99)

## 2022-07-19 SURGERY — COLONOSCOPY WITH PROPOFOL
Anesthesia: Monitor Anesthesia Care

## 2022-07-19 MED ORDER — SODIUM CHLORIDE 0.9 % IV SOLN: INTRAVENOUS | Status: DC

## 2022-07-19 MED ORDER — LACTATED RINGERS IV SOLN: INTRAVENOUS | Status: DC

## 2022-07-19 MED ORDER — PROPOFOL 10 MG/ML IV BOLUS
INTRAVENOUS | Status: DC | PRN
  Administered 2022-07-19: 30 mg via INTRAVENOUS
  Administered 2022-07-19: 50 mg via INTRAVENOUS
  Administered 2022-07-19: 20 mg via INTRAVENOUS
  Administered 2022-07-19: 110 mg via INTRAVENOUS

## 2022-07-19 MED ORDER — LIDOCAINE HCL (CARDIAC) PF 100 MG/5ML IV SOSY
PREFILLED_SYRINGE | INTRAVENOUS | Status: DC | PRN
  Administered 2022-07-19: 60 mg via INTRATRACHEAL

## 2022-07-19 MED ORDER — PROPOFOL 500 MG/50ML IV EMUL
INTRAVENOUS | Status: DC | PRN
  Administered 2022-07-19: 150 ug/kg/min via INTRAVENOUS

## 2022-07-19 MED ORDER — LACTATED RINGERS IV SOLN: INTRAVENOUS | Status: DC | PRN

## 2022-07-19 SURGICAL SUPPLY — 22 items

## 2022-07-19 NOTE — Op Note (Signed)
[facility] Patient Name: Jenna [name] Procedure Date : 07/19/2022 MRN: [id number] Attending MD: Dub Amis. Tomasa Rand , MD, [id number] Date of Birth: 1970-08-03 CSN: [id number] Age: 52 Admit Type: Outpatient Procedure:                Colonoscopy Indications:              Heme positive stool Providers:                Lorin Picket E. Tomasa Rand, MD, Geralyn Corwin, RN, Beryle Beams, Technician Referring MD:              Medicines:                Monitored Anesthesia Care Complications:            No immediate complications. Estimated Blood Loss:     Estimated blood loss was minimal. Procedure:                Pre-Anesthesia Assessment:                           - Prior to the procedure, a History and Physical                            was performed, and patient medications and                            allergies were reviewed. The patient's tolerance of                            previous anesthesia was also reviewed. The risks                            and benefits of the procedure and the sedation                            options and risks were discussed with the patient.                            All questions were answered, and informed consent                            was obtained. Prior Anticoagulants: The patient has                            taken no anticoagulant or antiplatelet agents. ASA                            Grade Assessment: III - A patient with severe                            systemic disease. After reviewing the risks and  benefits, the patient was deemed in satisfactory                            condition to undergo the procedure.                           After obtaining informed consent, the colonoscope                            was passed under direct vision. Throughout the                            procedure, the patient's blood pressure, pulse, and                            oxygen  saturations were monitored continuously. The                            CF-HQ190L ([id number]) Olympus coloscope was                            introduced through the anus and advanced to the the                            terminal ileum, with identification of the                            appendiceal orifice and IC valve. The colonoscopy                            was technically difficult and complex due to a                            difficult polypectomy. The patient tolerated the                            procedure well. The quality of the bowel                            preparation was good. The terminal ileum, ileocecal                            valve, appendiceal orifice, and rectum were                            photographed. Scope In: 7:34:41 AM Scope Out: 9:03:10 AM Scope Withdrawal Time: 1 hour 20 minutes 15 seconds  Total Procedure Duration: 1 hour 28 minutes 29 seconds  Findings:      The perianal and digital rectal examinations were normal. Pertinent       negatives include normal sphincter tone and no palpable rectal lesions.      A 35-40 mm polyp was found in the proximal ascending colon. The polyp       was sessile and multi-lobulated. The polyp was lifted with a total of  20       mL of Everlift in multiple injections. The polyp lifted uniformly. A       combination of hot and cold piecemeal polypectomy was used to resect the       polyp. Despite the polyp lifting initially, the polyp was not able to be       completely removed, as there appeared to be residual flat polyp tissue       which was not able to be resected, as the snare repeatedly slid over the       flattened portions, despite repeated injections and use of three       different snares. The resected tissue was retrieved. Estimated blood       loss was minimal.      Many flat polyps were found in the rectum, sigmoid colon and descending       colon. The polyps were 2 to 10 mm in size. Two of these polyps  were       removed with a cold snare. Resection and retrieval were complete.       Estimated blood loss was minimal.      The exam was otherwise normal throughout the examined colon.      The terminal ileum appeared normal.      The retroflexed view of the distal rectum and anal verge was normal and       showed no anal or rectal abnormalities. Impression:               - One 35-40 mm polyp in the proximal ascending                            colon, removed using piecemeal hot and cold snare                            polypectomy with submucosal lifting. Incomplete                            resection. Resected tissue retrieved.                           - Many 2 to 10 mm polyps in the rectum, in the                            sigmoid colon and in the descending colon, two                            removed with a cold snare. Resected and retrieved.                            These were consistent with hyperplastic polyps.                           - The examined portion of the ileum was normal.                           - The distal rectum and anal verge are normal on  retroflexion view. Moderate Sedation:      N/A Recommendation:           - Patient has a contact number available for                            emergencies. The signs and symptoms of potential                            delayed complications were discussed with the                            patient. Return to normal activities tomorrow.                            Written discharge instructions were provided to the                            patient.                           - Resume previous diet.                           - Continue present medications.                           - Await pathology results.                           - Will discuss repeat colonoscopy vs surgery with                            patient Procedure Code(s):        --- Professional ---                            [phone], Colonoscopy, flexible; with removal of                            tumor(s), polyp(s), or other lesion(s) by snare                            technique Diagnosis Code(s):        --- Professional ---                           D12.2, Benign neoplasm of ascending colon                           D12.8, Benign neoplasm of rectum                           D12.5, Benign neoplasm of sigmoid colon                           D12.4, Benign neoplasm of descending colon  R19.5, Other fecal abnormalities CPT copyright 2022 American [employer] Association. All rights reserved. The codes documented in this report are preliminary and upon coder review may  be revised to meet current compliance requirements. Booker Bhatnagar E. Tomasa Rand, MD 07/19/2022 9:23:46 AM This report has been signed electronically. Number of Addenda: 0

## 2022-07-19 NOTE — Discharge Instructions (Signed)

## 2022-07-19 NOTE — H&P (Signed)
[facility] History and Physical   Primary Care Physician:  Marcine Matar, MD   Reason for Procedure:   Positive hemoccult stool  Plan:    Colonoscopy     HPI: Jenna [name] is a 52 y.o. female undergoing initial colonoscopy after being found to have a positive fecal occult blood test in May 2023.  She has no family history of colon cancer and no chronic GI symptoms but does occasionally see bright red blood on the toilet paper.    Past Medical History:  Diagnosis Date   Arthritis    Back pain    Back pain    Chest pain    Depression    Edema of both lower extremities    Joint pain    Left arm numbness    Numbness in both hands    Osteoarthritis    Prediabetes    SOB (shortness of breath)    Vitamin D deficiency     Past Surgical History:  Procedure Laterality Date   CHOLECYSTECTOMY     TUBAL LIGATION      Prior to Admission medications   Medication Sig Start Date End Date Taking? Authorizing Provider  DULoxetine (CYMBALTA) 60 MG capsule Take 1 capsule (60 mg total) by mouth daily for back pain and mood. Patient taking differently: Take 60 mg by mouth daily as needed (back pain). 05/21/22  Yes Marcine Matar, MD  ferrous sulfate 325 (65 FE) MG tablet Take 325 mg by mouth every Wednesday.   Yes [provider]  hydrOXYzine (ATARAX) 25 MG tablet TAKE 1 TO 2 TABLETS BY MOUTH EVERY 6 TO 8 HOURS AS NEEDED FOR ITCHING 01/18/22  Yes Marcine Matar, MD  metFORMIN (GLUCOPHAGE-XR) 500 MG 24 hr tablet TAKE 1 TABLET(500 MG) BY MOUTH TWICE DAILY WITH A MEAL Patient taking differently: Take 500 mg by mouth 2 (two) times daily with breakfast and lunch. Take at breakfast and lunch 07/10/22  Yes Worthy Rancher, MD  naproxen (NAPROSYN) 500 MG tablet Take 1 tablet (500 mg total) by mouth 2 (two) times daily with a meal as needed for pain. 08/22/21  Yes Marcine Matar, MD  tiZANidine (ZANAFLEX) 4 MG tablet Take 1 tablet (4 mg total) by mouth every  8 (eight) hours as needed for muscle spasms. 11/20/21  Yes Kathryne Hitch, MD  topiramate (TOPAMAX) 25 MG tablet Take 25 mg (1 pill) at bedtime for one week, then increase to 50 mg (2 pills) at bedtime for one week, then take 75 mg (3 pills) at bedtime for one week, then take 100 mg (4 pills) at bedtime and continue on this dose Patient taking differently: Take 75 mg by mouth at bedtime. 09/25/21  Yes Ocie Doyne, MD  Vitamin D, Ergocalciferol, (DRISDOL) 1.25 MG (50000 UNIT) CAPS capsule Take 1 capsule (50,000 Units total) by mouth every 7 (seven) days. 03/28/22  Yes Worthy Rancher, MD  fluticasone [facility]) 50 MCG/ACT nasal spray Place 2 sprays into both nostrils daily. Patient taking differently: Place 2 sprays into both nostrils daily as needed for allergies or rhinitis. 10/06/20   Anders Simmonds, PA-C  gabapentin (NEURONTIN) 300 MG capsule Take 1 capsule (300 mg total) by mouth 3 (three) times daily as needed. 02/13/22   Kathryne Hitch, MD  rOPINIRole (REQUIP) 0.5 MG tablet Take 1 tablet (0.5 mg total) by mouth at bedtime. Patient not taking: Reported on 07/17/2022 07/11/21   Marcine Matar, MD    Current Facility-Administered Medications  Medication  Dose Route Frequency Provider Last Rate Last Admin   0.9 %  sodium chloride infusion   Intravenous Continuous Jenel Lucks, MD       lactated ringers infusion   Intravenous Continuous Jenel Lucks, MD 10 mL/hr at 07/19/22 0656 New Bag at 07/19/22 0656    Allergies as of 06/13/2022 - Review Complete 05/28/2022  Allergen Reaction Noted   Diflucan [fluconazole] Hives 02/11/2020    Family History  Problem Relation Age of Onset   Colon polyps Mother    Diabetes Mother    Hypertension Mother    Hypertension Father    Breast cancer Neg Hx    Colon cancer Neg Hx    Esophageal cancer Neg Hx    Rectal cancer Neg Hx    Stomach cancer Neg Hx     Social History   Socioeconomic History   Marital status:  Single    Spouse name: Not on file   Number of children: Not on file   Years of education: Not on file   Highest education level: Not on file  Occupational History   Occupation: Stay at home, disabled  Tobacco Use   Smoking status: Former    Packs/day: .1    Types: Cigarettes    Quit date: 10/10/2021    Years since quitting: 0.7   Smokeless tobacco: Never  Vaping Use   Vaping Use: Never used  Substance and Sexual Activity   Alcohol use: No    Alcohol/week: 0.0 standard drinks of alcohol   Drug use: No   Sexual activity: Not on file  Other Topics Concern   Not on file  Social History Narrative   Not on file   Social Determinants of Health   Financial Resource Strain: Not on file  Food Insecurity: No Food Insecurity (09/27/2021)   Hunger Vital Sign    Worried About Running Out of Food in the Last Year: Never true    Ran Out of Food in the Last Year: Never true  Transportation Needs: Not on file  Physical Activity: Not on file  Stress: Not on file  Social Connections: Not on file  Intimate Partner Violence: Not on file    Review of Systems:  All other review of systems negative except as mentioned in the HPI.  Physical Exam: Vital signs BP 136/78   Pulse 96   Temp 97.8 F (36.6 C) (Temporal)   Resp 14   Ht 5\' 5"  (1.651 m)   Wt (!) 137.4 kg   LMP 06/11/2022 (Approximate) Comment: tubal ligation per pt  SpO2 98%   BMI 50.41 kg/m   General:   Alert,  Well-developed, well-nourished, pleasant and cooperative in NAD Airway:  Mallampati 3 Lungs:  Clear throughout to auscultation.   Heart:  Regular rate and rhythm; no murmurs, clicks, rubs,  or gallops. Abdomen:  Soft, nontender and nondistended. Normal bowel sounds.   Neuro/Psych:  Normal mood and affect. A and O x 3   Jenna [name] E. Tomasa Rand, MD [facility]

## 2022-07-19 NOTE — Transfer of Care (Signed)
Immediate Anesthesia Transfer of Care Note  Patient: Jenna [name]  Procedure(s) Performed: COLONOSCOPY WITH PROPOFOL SUBMUCOSAL LIFTING INJECTION POLYPECTOMY  Patient Location: Endoscopy Unit  Anesthesia Type:MAC  Level of Consciousness: awake, alert , and oriented  Airway & Oxygen Therapy: Patient Spontanous Breathing and Patient connected to nasal cannula oxygen  Post-op Assessment: Report given to RN and Post -op Vital signs reviewed and stable  Post vital signs: Reviewed and stable  Last Vitals:  Vitals Value Taken Time  BP 136/94 07/19/22 0911  Temp 36.1 C 07/19/22 0911  Pulse 87 07/19/22 0913  Resp 17 07/19/22 0913  SpO2 100 % 07/19/22 0913  Vitals shown include unvalidated device data.  Last Pain:  Vitals:   07/19/22 0911  TempSrc: Temporal  PainSc: Asleep         Complications: No notable events documented.

## 2022-07-19 NOTE — Anesthesia Postprocedure Evaluation (Signed)
Anesthesia Post Note  Patient: Jenna [name]  Procedure(s) Performed: COLONOSCOPY WITH PROPOFOL SUBMUCOSAL LIFTING INJECTION POLYPECTOMY     Patient location during evaluation: PACU Anesthesia Type: MAC Level of consciousness: awake and alert Pain management: pain level controlled Vital Signs Assessment: post-procedure vital signs reviewed and stable Respiratory status: spontaneous breathing, nonlabored ventilation and respiratory function stable Cardiovascular status: stable and blood pressure returned to baseline Postop Assessment: no apparent nausea or vomiting Anesthetic complications: no  No notable events documented.  Last Vitals:  Vitals:   07/19/22 0920 07/19/22 0930  BP: (!) 136/94 114/80  Pulse: 86 77  Resp: 16 (!) 24  Temp:    SpO2: 98% 100%    Last Pain:  Vitals:   07/19/22 0930  TempSrc:   PainSc: 0-No pain                 Aryanna Shaver,W. EDMOND

## 2022-07-20 LAB — SURGICAL PATHOLOGY

## 2022-07-22 ENCOUNTER — Encounter (HOSPITAL_COMMUNITY): Payer: Self-pay | Admitting: Gastroenterology

## 2022-07-23 ENCOUNTER — Encounter (INDEPENDENT_AMBULATORY_CARE_PROVIDER_SITE_OTHER): Payer: Self-pay | Admitting: Internal Medicine

## 2022-07-23 ENCOUNTER — Other Ambulatory Visit (INDEPENDENT_AMBULATORY_CARE_PROVIDER_SITE_OTHER): Payer: Self-pay | Admitting: Internal Medicine

## 2022-07-23 ENCOUNTER — Ambulatory Visit (INDEPENDENT_AMBULATORY_CARE_PROVIDER_SITE_OTHER): Payer: 59 | Admitting: Internal Medicine

## 2022-07-23 ENCOUNTER — Telehealth (INDEPENDENT_AMBULATORY_CARE_PROVIDER_SITE_OTHER): Payer: Self-pay | Admitting: Internal Medicine

## 2022-07-23 ENCOUNTER — Other Ambulatory Visit: Payer: Self-pay

## 2022-07-23 VITALS — BP 128/70 | HR 79 | Temp 98.2°F | Ht 65.0 in | Wt 310.0 lb

## 2022-07-23 DIAGNOSIS — R600 Localized edema: Secondary | ICD-10-CM | POA: Diagnosis not present

## 2022-07-23 DIAGNOSIS — R7303 Prediabetes: Secondary | ICD-10-CM | POA: Diagnosis not present

## 2022-07-23 DIAGNOSIS — Z6841 Body Mass Index (BMI) 40.0 and over, adult: Secondary | ICD-10-CM

## 2022-07-23 DIAGNOSIS — E669 Obesity, unspecified: Secondary | ICD-10-CM

## 2022-07-23 DIAGNOSIS — E559 Vitamin D deficiency, unspecified: Secondary | ICD-10-CM

## 2022-07-23 MED ORDER — VITAMIN D (ERGOCALCIFEROL) 1.25 MG (50000 UNIT) PO CAPS
50000.0000 [IU] | ORAL_CAPSULE | ORAL | 0 refills | Status: DC
Start: 2022-07-23 — End: 2022-08-03

## 2022-07-23 MED ORDER — FUROSEMIDE 20 MG PO TABS
20.0000 mg | ORAL_TABLET | Freq: Every day | ORAL | 0 refills | Status: DC
Start: 2022-07-23 — End: 2022-07-23
  Filled 2022-07-23: qty 30, 30d supply, fill #0

## 2022-07-23 MED ORDER — FUROSEMIDE 20 MG PO TABS
20.0000 mg | ORAL_TABLET | Freq: Every day | ORAL | 0 refills | Status: DC
Start: 2022-07-23 — End: 2023-04-02

## 2022-07-23 MED ORDER — VITAMIN D (ERGOCALCIFEROL) 1.25 MG (50000 UNIT) PO CAPS
50000.0000 [IU] | ORAL_CAPSULE | ORAL | 0 refills | Status: DC
Start: 2022-07-23 — End: 2022-07-23
  Filled 2022-07-23: qty 12, 84d supply, fill #0

## 2022-07-23 NOTE — Assessment & Plan Note (Signed)
Most recent A1c is  Lab Results  Component Value Date   HGBA1C 6.3 (H) 03/14/2022   with associated elevated insulin levels.  Patient informed of disease state and risk of progression. This may contribute to abnormal cravings, fatigue and diabetes complications without having diabetes.  She is currently on metformin twice a day and denies any adverse effects  Continue metformin.  She will work on gradually implementing reduced calorie nutrition plan again.

## 2022-07-23 NOTE — Progress Notes (Signed)
Office: [phone]  /  Fax: [phone]  WEIGHT SUMMARY AND BIOMETRICS  Vitals Temp: 98.2 F (36.8 C) BP: 128/70 Pulse Rate: 79 SpO2: 99 %   Anthropometric Measurements Height: 5\' 5"  (1.651 m) Weight: (!) 310 lb (140.6 kg) BMI (Calculated): 51.59 Weight at Last Visit: 303 lb Weight Gained Since Last Visit: 7 lb Starting Weight: 321 lb Total Weight Loss (lbs): 11 lb (4.99 kg)   Body Composition  Body Fat %: 52.9 % Fat Mass (lbs): 164 lbs Muscle Mass (lbs): 13808 lbs Total Body Water (lbs): 108.6 lbs Visceral Fat Rating : 20    No data recorded Today's Visit #: 7  Starting Date: 03/14/22   HPI  Chief Complaint: OBESITY  Jenna [name] is here to discuss her progress with her obesity treatment plan. She is on the the Category 3 Plan and states she is following her eating plan approximately 50 % of the time. She states she is exercising 37 minutes 7 times per week.  Interval History:  Since last office visit she has []  lost []  maintained [x] gained weight. Still eating soft bland diet due to dental work, has been gaining weight as a result.  She has reduced protein intake and has been mostly eating carbs and occasionally protein shakes.  She will be getting her partials this week.  She also notes lower extremity edema but denies shortness of breath.  She denies a history of heart failure and does acknowledge has been eating salty foods recently.  Adherence to nutrition plan :  []  Excellent []  Good []  Fair [x]  Suboptimal []  Variable []  Gradually implementing [] Has not started implementation.  Due to poor dentition  Nutritional: Consuming mostly carbs very little protein.  Orexigenic Control: [] Denies [x] Reports problems with appetite and hunger signals.  [x] Denies [] Reports problems with satiety and satiation.  [x] Denies [] Reports problems with eating patterns and portion control.  [x] Denies [] Reports strong cravings for highly palatable foods [x] Denies [] Reports  problems with feeling restricted or deprived  Stress levels: [x]  Low [] Medium [] High   Barriers identified: having difficulty preparing healthy meals, having difficulty with meal prep and planning, and medical comorbidities.   Pharmacotherapy for weight loss: She is currently taking no anti-obesity medication.    ASSESSMENT AND PLAN  TREATMENT PLAN FOR OBESITY:  Recommended Dietary Goals  [name] is currently in the action stage of change. As such, her goal is to continue weight management plan. She has agreed to: continue current plan  Behavioral Intervention  We discussed the following Behavioral Modification Strategies today: increasing lean protein intake, decreasing simple carbohydrates , increasing vegetables, increasing lower glycemic fruits, increasing fiber rich foods, avoiding skipping meals, increasing water intake, work on tracking and journaling calories using tracking application, and planning for success.  Additional resources provided today: None  Recommended Physical Activity Goals  [name] has been advised to work up to 150 minutes of moderate intensity aerobic activity a week and strengthening exercises 2-3 times per week for cardiovascular health, weight loss maintenance and preservation of muscle mass.   She has agreed to :  Think about ways to increase daily physical activity and overcoming barriers to exercise  Pharmacotherapy We discussed various medication options to help Reneka with her weight loss efforts and we both agreed to :  Continue metformin for insulin resistance and incretin effect.  ASSOCIATED CONDITIONS ADDRESSED TODAY  Bilateral leg edema Assessment & Plan: She has bilateral pitting edema to mid tibia but denies congestive pulmonary symptoms.  Her BIA suggest fluid retention.  I  reviewed records and did not see an echocardiogram on file.  She has risk factors for diastolic dysfunction.  I will be checking a proBNP.  We will also do a  trial of furosemide 20 mg once a day for 3 days.  Patient also advised to reduce sodium in her diet as she appears to be salt sensitive.  We will consider echocardiography in the future.  Orders: -     Furosemide; Take 1 tablet (20 mg total) by mouth daily.  Dispense: 30 tablet; Refill: 0 -     CMP14+EGFR -     Pro b natriuretic peptide (BNP)  Vitamin D deficiency Assessment & Plan: We will check vitamin D levels.  She has been on high-dose supplementation for 4 months.  We will transition her to over-the-counter supplementation if levels are adequate.  Orders: -     Vitamin D (Ergocalciferol); Take 1 capsule (50,000 Units total) by mouth every 7 (seven) days.  Dispense: 12 capsule; Refill: 0 -     VITAMIN D 25 Hydroxy (Vit-D Deficiency, Fractures)  Prediabetes Assessment & Plan: Most recent A1c is  Lab Results  Component Value Date   HGBA1C 6.3 (H) 03/14/2022   with associated elevated insulin levels.  Patient informed of disease state and risk of progression. This may contribute to abnormal cravings, fatigue and diabetes complications without having diabetes.  She is currently on metformin twice a day and denies any adverse effects  Continue metformin.  She will work on gradually implementing reduced calorie nutrition plan again.    Obesity with current BMI of 50 Assessment & Plan: She has experienced weight regain due to problems with dentition and dietary changes.  This will likely improve over the next 2 weeks.  It also looks like she is retaining water based on physical exam and BIA information.     PHYSICAL EXAM:  Blood pressure 128/70, pulse 79, temperature 98.2 F (36.8 C), height 5\' 5"  (1.651 m), weight (!) 310 lb (140.6 kg), last menstrual period 06/11/2022, SpO2 99 %. Body mass index is 51.59 kg/m.  General: She is overweight, cooperative, alert, well developed, and in no acute distress. PSYCH: Has normal mood, affect and thought process.   HEENT: EOMI, sclerae  are anicteric. Lungs: Normal breathing effort, no conversational dyspnea. Extremities: No edema.  Neurologic: No gross sensory or motor deficits. No tremors or fasciculations noted.    DIAGNOSTIC DATA REVIEWED:  BMET    Component Value Date/Time   NA 139 03/14/2022 1030   K 4.7 03/14/2022 1030   CL 106 03/14/2022 1030   CO2 20 03/14/2022 1030   GLUCOSE 100 (H) 03/14/2022 1030   GLUCOSE 118 (H) 12/20/2021 1430   BUN 10 03/14/2022 1030   CREATININE 0.86 03/14/2022 1030   CALCIUM 9.6 03/14/2022 1030   GFRNONAA 55 (L) 12/20/2021 1430   GFRAA >60 09/25/2019 1548   Lab Results  Component Value Date   HGBA1C 6.3 (H) 03/14/2022   HGBA1C 5.6 12/03/2017   Lab Results  Component Value Date   INSULIN 41.6 (H) 03/14/2022   INSULIN 7.4 12/08/2018   Lab Results  Component Value Date   TSH 1.655 12/20/2021   CBC    Component Value Date/Time   WBC 8.1 12/20/2021 1430   RBC 4.57 12/20/2021 1430   HGB 12.5 12/20/2021 1430   HGB 12.5 01/23/2021 1525   HCT 38.9 12/20/2021 1430   HCT 37.4 01/23/2021 1525   PLT 333 12/20/2021 1430   PLT 284 01/23/2021 1525  MCV 85.1 12/20/2021 1430   MCV 88 01/23/2021 1525   MCH 27.4 12/20/2021 1430   MCHC 32.1 12/20/2021 1430   RDW 14.6 12/20/2021 1430   RDW 15.1 01/23/2021 1525   Iron Studies    Component Value Date/Time   IRON 53 04/02/2019 1510   TIBC 360 04/02/2019 1510   FERRITIN 26 04/02/2019 1510   IRONPCTSAT 15 04/02/2019 1510   Lipid Panel     Component Value Date/Time   CHOL 140 03/14/2022 1030   TRIG 68 03/14/2022 1030   HDL 46 03/14/2022 1030   CHOLHDL 3.8 10/06/2020 1458   LDLCALC 80 03/14/2022 1030   Hepatic Function Panel     Component Value Date/Time   PROT 7.0 03/14/2022 1030   ALBUMIN 4.2 03/14/2022 1030   AST 17 03/14/2022 1030   ALT 17 03/14/2022 1030   ALKPHOS 78 03/14/2022 1030   BILITOT 0.4 03/14/2022 1030      Component Value Date/Time   TSH 1.655 12/20/2021 1430   TSH 1.430 01/23/2021 1525    Nutritional Lab Results  Component Value Date   VD25OH 14.6 (L) 03/14/2022   VD25OH 14.8 (L) 01/23/2021   VD25OH 12.6 (L) 10/06/2020     Return in about 2 weeks (around 08/06/2022) for For Weight Mangement with Dr. Rikki Spearing.[name] She was informed of the importance of frequent follow up visits to maximize her success with intensive lifestyle modifications for her multiple health conditions.   ATTESTASTION STATEMENTS:  Reviewed by clinician on day of visit: allergies, medications, problem list, medical history, surgical history, family history, social history, and previous encounter notes.     Worthy Rancher, MD

## 2022-07-23 NOTE — Assessment & Plan Note (Signed)
She has bilateral pitting edema to mid tibia but denies congestive pulmonary symptoms.  Her BIA suggest fluid retention.  I reviewed records and did not see an echocardiogram on file.  She has risk factors for diastolic dysfunction.  I will be checking a proBNP.  We will also do a trial of furosemide 20 mg once a day for 3 days.  Patient also advised to reduce sodium in her diet as she appears to be salt sensitive.  We will consider echocardiography in the future.

## 2022-07-23 NOTE — Assessment & Plan Note (Signed)
She has experienced weight regain due to problems with dentition and dietary changes.  This will likely improve over the next 2 weeks.  It also looks like she is retaining water based on physical exam and BIA information.

## 2022-07-23 NOTE — Assessment & Plan Note (Signed)
We will check vitamin D levels.  She has been on high-dose supplementation for 4 months.  We will transition her to over-the-counter supplementation if levels are adequate.

## 2022-07-23 NOTE — Telephone Encounter (Signed)
5/13 patient stated that her Vitamin D was denied. Jenna [name]

## 2022-07-24 LAB — CMP14+EGFR
ALT: 28 IU/L (ref 0–32)
AST: 24 IU/L (ref 0–40)
Albumin/Globulin Ratio: 1.3 (ref 1.2–2.2)
Albumin: 3.9 g/dL (ref 3.9–4.9)
Alkaline Phosphatase: 81 IU/L (ref 44–121)
BUN/Creatinine Ratio: 9 (ref 9–23)
BUN: 8 mg/dL (ref 6–24)
Bilirubin Total: 0.4 mg/dL (ref 0.0–1.2)
CO2: 19 mmol/L — ABNORMAL LOW (ref 20–29)
Calcium: 9 mg/dL (ref 8.7–10.2)
Chloride: 108 mmol/L — ABNORMAL HIGH (ref 96–106)
Creatinine, Ser: 0.89 mg/dL (ref 0.57–1.00)
Globulin, Total: 2.9 g/dL (ref 1.5–4.5)
Glucose: 105 mg/dL — ABNORMAL HIGH (ref 70–99)
Potassium: 4.7 mmol/L (ref 3.5–5.2)
Sodium: 140 mmol/L (ref 134–144)
Total Protein: 6.8 g/dL (ref 6.0–8.5)
eGFR: 79 mL/min/{1.73_m2} (ref >59)

## 2022-07-24 LAB — VITAMIN D 25 HYDROXY (VIT D DEFICIENCY, FRACTURES): Vit D, 25-Hydroxy: 28.3 ng/mL — ABNORMAL LOW (ref 30.0–100.0)

## 2022-07-24 LAB — PRO B NATRIURETIC PEPTIDE: NT-Pro BNP: 56 pg/mL (ref 0–249)

## 2022-07-24 NOTE — Telephone Encounter (Signed)
Vitamin D was sent in 07/23/22 at 12:08.

## 2022-07-25 NOTE — Progress Notes (Signed)
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  The polyp did not have any 'advanced' features to suggest a higher cancer potential such as high grade dysplasia.  This is good news.   As discussed, given the appearance of the polypectomy site, I suspect that there is residual polyp there that I was unable to remove completely.  I have discussed your case with our advanced endoscopist, Dr. Meridee Score, who agreed to repeat a colonoscopy when he is available to remove the remaining polyp tissue.  His nurse will contact you when there is an available appointment, but it will likely be August or later.

## 2022-07-27 ENCOUNTER — Other Ambulatory Visit: Payer: Self-pay

## 2022-07-27 ENCOUNTER — Telehealth: Payer: Self-pay

## 2022-07-27 DIAGNOSIS — Z86010 Personal history of colonic polyps: Secondary | ICD-10-CM

## 2022-07-27 MED ORDER — NA SULFATE-K SULFATE-MG SULF 17.5-3.13-1.6 GM/177ML PO SOLN
1.0000 | Freq: Once | ORAL | 0 refills | Status: AC
  Filled 2022-07-27: qty 354, 1d supply, fill #0

## 2022-07-27 NOTE — Telephone Encounter (Signed)
10/18/22 at 4 pm previsit has been scheduled    Colon and previsit has been scheduled, pt instructed and medications reviewed.  Patient instructions mailed to home and sent to My Chart.  Patient to call with any questions or concerns.

## 2022-07-27 NOTE — Telephone Encounter (Signed)
Colon EMR has been scheduled for 10/25/22 at 945 am at [facility] with GM   Left message on machine to call back

## 2022-07-27 NOTE — Telephone Encounter (Signed)
-----   Message from Lemar Lofty., MD sent at 07/27/2022  5:40 AM EDT ----- Regarding: RE: Large, incomplete polypectomy we discussed SEC, Reviewed the procedure report.  Jenna [name], This patient can be scheduled for colonoscopy with EMR in the next 3 to 4 months 90-minute session.  Endorotor available.  She may be offered direct procedure since she already had an EMR attempt with Dr. Tomasa Rand but if she wants to meet me to talk about different possible techniques, then set her up for clinic. Thanks. GM ----- Message ----- From: Jenel Lucks, MD Sent: 07/25/2022   4:27 PM EDT To: Loretha Stapler, RN; Lemar Lofty., MD Subject: Large, incomplete polypectomy we discussed     ~4 cm, lobulated polyp in [facility], lifted fine, but after piecemeal polypectomy (hot and cold) there appeared to be a 'sheen' of polyp which could never grasp despite using three different snares and multiple subsequent injections. 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

## 2022-08-02 ENCOUNTER — Other Ambulatory Visit: Payer: Self-pay

## 2022-08-03 ENCOUNTER — Other Ambulatory Visit: Payer: Self-pay

## 2022-08-03 ENCOUNTER — Ambulatory Visit (HOSPITAL_COMMUNITY)
Admission: EM | Admit: 2022-08-03 | Discharge: 2022-08-03 | Disposition: A | Discharge status: Home / Self Care | Payer: 59 | Attending: Internal Medicine | Admitting: Internal Medicine

## 2022-08-03 ENCOUNTER — Other Ambulatory Visit (INDEPENDENT_AMBULATORY_CARE_PROVIDER_SITE_OTHER): Payer: Self-pay | Admitting: Internal Medicine

## 2022-08-03 ENCOUNTER — Encounter (HOSPITAL_COMMUNITY): Payer: Self-pay

## 2022-08-03 DIAGNOSIS — J209 Acute bronchitis, unspecified: Secondary | ICD-10-CM

## 2022-08-03 DIAGNOSIS — R0602 Shortness of breath: Secondary | ICD-10-CM | POA: Diagnosis not present

## 2022-08-03 DIAGNOSIS — E559 Vitamin D deficiency, unspecified: Secondary | ICD-10-CM

## 2022-08-03 MED ORDER — DEXAMETHASONE SODIUM PHOSPHATE 10 MG/ML IJ SOLN
INTRAMUSCULAR | Status: AC
  Filled 2022-08-03: qty 1

## 2022-08-03 MED ORDER — VITAMIN D (ERGOCALCIFEROL) 1.25 MG (50000 UNIT) PO CAPS
50000.0000 [IU] | ORAL_CAPSULE | ORAL | 0 refills | Status: DC
Start: 2022-08-03 — End: 2022-10-01
  Filled 2022-08-03: qty 12, 84d supply, fill #0

## 2022-08-03 MED ORDER — DEXAMETHASONE SODIUM PHOSPHATE 10 MG/ML IJ SOLN
10.0000 mg | Freq: Once | INTRAMUSCULAR | Status: AC
  Administered 2022-08-03: 10 mg via INTRAMUSCULAR

## 2022-08-03 MED ORDER — ALBUTEROL SULFATE HFA 108 (90 BASE) MCG/ACT IN AERS
INHALATION_SPRAY | RESPIRATORY_TRACT | Status: AC
  Filled 2022-08-03: qty 6.7

## 2022-08-03 MED ORDER — KETOROLAC TROMETHAMINE 30 MG/ML IJ SOLN
30.0000 mg | Freq: Once | INTRAMUSCULAR | Status: AC
  Administered 2022-08-03: 30 mg via INTRAMUSCULAR

## 2022-08-03 MED ORDER — ALBUTEROL SULFATE HFA 108 (90 BASE) MCG/ACT IN AERS
2.0000 | INHALATION_SPRAY | Freq: Once | RESPIRATORY_TRACT | Status: AC
  Administered 2022-08-03: 2 via RESPIRATORY_TRACT

## 2022-08-03 MED ORDER — BENZONATATE 100 MG PO CAPS
100.0000 mg | ORAL_CAPSULE | Freq: Three times a day (TID) | ORAL | 0 refills | Status: DC
  Filled 2022-08-03: qty 20, 6d supply, fill #0
  Filled 2022-08-03: qty 1, 1d supply, fill #0

## 2022-08-03 MED ORDER — GUAIFENESIN ER 600 MG PO TB12
1200.0000 mg | ORAL_TABLET | Freq: Two times a day (BID) | ORAL | 0 refills | Status: DC
  Filled 2022-08-03 – 2022-08-07 (×2): qty 30, 8d supply, fill #0

## 2022-08-03 MED ORDER — KETOROLAC TROMETHAMINE 30 MG/ML IJ SOLN
INTRAMUSCULAR | Status: AC
  Filled 2022-08-03: qty 1

## 2022-08-03 NOTE — Discharge Instructions (Addendum)
You have bronchitis which is inflammation of the upper airways in your lungs due to a virus. The following medicines will help with your symptoms.   - You may use albuterol inhaler 1 to 2 puffs every 4-6 hours as needed for cough, shortness of breath, and wheezing. - Take cough medicines as prescribed.  - I gave you medicine in the clinic to help with headache. Do not take ibuprofen until tomorrow since I gave you this medicine in the clinic.  - Tessalon perles every 8 hours as needed for cough. - Continue using over the counter medicines as needed as directed. Plain mucinex (guaifenesin) over the counter may further help breakup mucus and help with symptoms.   If you develop any new or worsening symptoms or do not improve in the next 2 to 3 days, please return.  If your symptoms are severe, please go to the emergency room.  Follow-up with your primary care provider for further evaluation and management of your symptoms as well as ongoing wellness visits.  I hope you feel better!

## 2022-08-03 NOTE — ED Provider Notes (Signed)
[facility]    CSN: [id number] Arrival date & time: 08/03/22  1235      History   Chief Complaint Chief Complaint  Patient presents with   Cough    HPI Jenna [name] is a 52 y.o. female.   Patient presents to urgent care for evaluation of cough, nasal congestion, generalized body pain, shortness of breath, chest tightness associated with coughing, and headache that started 6 days ago on Saturday, Jul 28, 2022.  No recent known sick contacts with similar symptoms.  Cough is sometimes productive but mostly dry.  When cough is productive, she is able to get up a small amount of thick yellow sputum.  Reports shortness of breath and chest tightness associated with coughing.  Denies leg swelling orthopnea.  No nausea, vomiting, diarrhea, abdominal pain, rash, vision changes, dizziness, photophobia, phonophobia, or lightheadedness.  Headache is to the crown/frontal aspect of the forehead and worsened by coughing.  History of migraines.  She is a prediabetic and takes metformin daily.  Denies history of chronic respiratory problems.  Former smoker, denies drug use.  No recent antibiotic or steroid use.  Intermittently using over-the-counter medications without much relief of symptoms.   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/03/2021   Fecal occult blood test positive 07/15/2021   Periodic limb movement 07/11/2021   Loud snoring 01/23/2021   Severe obesity  (BMI >= 40) (HCC) 02/08/2020   Insulin resistance 07/28/2019   Depression 07/28/2019   Vitamin D deficiency 12/09/2018   Class 3 severe obesity without serious comorbidity with body mass index (BMI) of 50.0 to 59.9 in adult (HCC) 01/17/2017   Low back pain 05/17/2016   Unilateral primary osteoarthritis, left knee 05/08/2016   Primary osteoarthritis of both knees 06/27/2015   Closed right fibular fracture 08/10/2014    Past Surgical History:  Procedure Laterality Date   CHOLECYSTECTOMY     COLONOSCOPY WITH PROPOFOL N/A 07/19/2022   Procedure: COLONOSCOPY WITH PROPOFOL;  Surgeon: Jenel Lucks, MD;  Location: [facility] ENDOSCOPY;  Service: Gastroenterology;  Laterality: N/A;   POLYPECTOMY  07/19/2022   Procedure: POLYPECTOMY;  Surgeon: Jenel Lucks, MD;  Location: [facility] ENDOSCOPY;  Service: Gastroenterology;;   SUBMUCOSAL LIFTING INJECTION  07/19/2022   Procedure: SUBMUCOSAL LIFTING INJECTION;  Surgeon: Jenel Lucks, MD;  Location: [facility] ENDOSCOPY;  Service: Gastroenterology;;   TUBAL LIGATION      OB History     Gravida  6   Para  6   Term      Preterm      AB      Living         SAB      IAB      Ectopic      Multiple      Live Births  Home Medications    Prior to Admission medications   Medication Sig Start Date End Date Taking? Authorizing Provider  benzonatate (TESSALON) 100 MG capsule Take 1 capsule (100 mg total) by mouth every 8 (eight) hours. 08/03/22  Yes Carlisle Beers, FNP  guaiFENesin (MUCINEX) 600 MG 12 hr tablet Take 2 tablets (1,200 mg total) by mouth 2 (two) times daily. 08/03/22  Yes Carlisle Beers, FNP  DULoxetine (CYMBALTA) 60 MG capsule Take 1 capsule (60 mg total) by mouth daily for back pain and mood. Patient taking differently: Take 60 mg by mouth daily as needed (back pain). 05/21/22   Marcine Matar, MD  ferrous sulfate 325 (65 FE) MG tablet Take 325 mg by mouth every Wednesday.    [provider]  fluticasone (FLONASE) 50 MCG/ACT nasal spray Place 2 sprays into both nostrils daily. Patient taking differently: Place 2 sprays into both nostrils daily as needed for allergies or rhinitis. 10/06/20   Anders Simmonds, PA-C  furosemide (LASIX) 20 MG tablet Take 1 tablet (20 mg total) by mouth daily. 07/23/22   Worthy Rancher, MD  gabapentin (NEURONTIN) 300 MG capsule Take 1 capsule (300 mg total) by mouth 3 (three) times daily as needed. 02/13/22   Kathryne Hitch, MD  hydrOXYzine (ATARAX) 25 MG tablet TAKE 1 TO 2 TABLETS BY MOUTH EVERY 6 TO 8 HOURS AS NEEDED FOR ITCHING 01/18/22   Marcine Matar, MD  metFORMIN (GLUCOPHAGE-XR) 500 MG 24 hr tablet TAKE 1 TABLET(500 MG) BY MOUTH TWICE DAILY WITH A MEAL Patient taking differently: Take 500 mg by mouth 2 (two) times daily with breakfast and lunch. Take at breakfast and lunch 07/10/22   Worthy Rancher, MD  naproxen (NAPROSYN) 500 MG tablet Take 1 tablet (500 mg total) by mouth 2 (two) times daily with a meal as needed for pain. 08/22/21   Marcine Matar, MD  rOPINIRole (REQUIP) 0.5 MG tablet Take 1 tablet (0.5 mg total) by mouth at bedtime. 07/11/21   Marcine Matar, MD  tiZANidine (ZANAFLEX) 4 MG tablet Take 1 tablet (4 mg total) by mouth every 8 (eight) hours as needed for muscle spasms. 11/20/21   Kathryne Hitch, MD  topiramate (TOPAMAX) 25 MG tablet Take 25 mg (1 pill) at bedtime for one week, then increase to 50 mg (2 pills) at bedtime for one week, then take 75 mg (3 pills) at bedtime for one week, then take 100 mg (4 pills) at bedtime and continue on this dose Patient taking differently: Take 75 mg by mouth at bedtime. 09/25/21   Ocie Doyne, MD  Vitamin D, Ergocalciferol, (DRISDOL) 1.25 MG (50000 UNIT) CAPS capsule Take 1 capsule (50,000 Units total) by mouth every 7 (seven) days. 07/23/22   Worthy Rancher, MD    Family History Family History  Problem Relation Age of Onset   Colon polyps  Mother    Diabetes Mother    Hypertension Mother    Hypertension Father    Breast cancer Neg Hx    Colon cancer Neg Hx    Esophageal cancer Neg Hx    Rectal cancer Neg Hx    Stomach cancer Neg Hx     Social History Social History   Tobacco Use   Smoking status: Former    Packs/day: .1    Types: Cigarettes    Quit date: 10/10/2021    Years since quitting: 0.8   Smokeless tobacco: Never  Vaping Use   Vaping Use: Never used  Substance Use Topics   Alcohol use: No    Alcohol/week: 0.0 standard drinks of alcohol   Drug use: No     Allergies   Diflucan [fluconazole]   Review of Systems Review of Systems  Respiratory:  Positive for cough.   Per HPI   Physical Exam Triage Vital Signs ED Triage Vitals [08/03/22 1318]  Enc Vitals Group     BP (!) 148/94     Pulse Rate 97     Resp 16     Temp 98.5 F (36.9 C)     Temp Source Oral     SpO2 97 %     Weight      Height      Head Circumference      Peak Flow      Pain Score 6     Pain Loc      Pain Edu?      Excl. in GC?    No data found.  Updated Vital Signs BP (!) 148/94 (BP Location: Left Arm)   Pulse 97   Temp 98.5 F (36.9 C) (Oral)   Resp 16   LMP 06/11/2022 (Approximate) Comment: tubal ligation per pt  SpO2 97%   Visual Acuity Right Eye Distance:   Left Eye Distance:   Bilateral Distance:    Right Eye Near:   Left Eye Near:    Bilateral Near:     Physical Exam Vitals and nursing note reviewed.  Constitutional:      Appearance: She is ill-appearing. She is not toxic-appearing.  HENT:     Head: Normocephalic and atraumatic.     Right Ear: Hearing, tympanic membrane, ear canal and external ear normal.     Left Ear: Hearing, tympanic membrane, ear canal and external ear normal.     Nose: Congestion present.     Mouth/Throat:     Lips: Pink.     Mouth: Mucous membranes are moist. No injury.     Tongue: No lesions. Tongue does not deviate from midline.     Palate: No mass and lesions.      Pharynx: Oropharynx is clear. Uvula midline. No pharyngeal swelling, oropharyngeal exudate, posterior oropharyngeal erythema or uvula swelling.     Tonsils: No tonsillar exudate or tonsillar abscesses.  Eyes:     General: Lids are normal. Vision grossly intact. Gaze aligned appropriately.     Extraocular Movements: Extraocular movements intact.     Conjunctiva/sclera: Conjunctivae normal.  Cardiovascular:     Rate and Rhythm: Normal rate and regular rhythm.     Heart sounds: Normal heart sounds, S1 normal and S2 normal.  Pulmonary:     Effort: Pulmonary effort is normal. No respiratory distress.     Breath sounds: Normal air entry. No stridor. Wheezing present. No rhonchi or rales.     Comments: Wheezing heard during coughing, however clears with cough.  Coarse breath sounds throughout.  Speaking in full sentences without difficulty. Chest:     Chest wall: No tenderness.  Musculoskeletal:     Cervical back: Neck supple.     Right lower leg: No edema.     Left lower leg: No edema.  Lymphadenopathy:     Cervical: No cervical adenopathy.  Skin:    General: Skin is warm and dry.     Capillary Refill: Capillary refill takes less than 2 seconds.     Findings: No rash.  Neurological:     General: No focal deficit present.     Mental  Status: She is alert and oriented to person, place, and time. Mental status is at baseline.     Cranial Nerves: No dysarthria or facial asymmetry.  Psychiatric:        Mood and Affect: Mood normal.        Speech: Speech normal.        Behavior: Behavior normal.        Thought Content: Thought content normal.        Judgment: Judgment normal.    UC Treatments / Results  Labs (all labs ordered are listed, but only abnormal results are displayed) Labs Reviewed - No data to display  EKG   Radiology No results found.  Procedures Procedures (including critical care time)  Medications Ordered in UC Medications  albuterol (VENTOLIN HFA) 108 (90 Base)  MCG/ACT inhaler 2 puff (2 puffs Inhalation Given 08/03/22 1421)  dexamethasone (DECADRON) injection 10 mg (10 mg Intramuscular Given 08/03/22 1422)  ketorolac (TORADOL) 30 MG/ML injection 30 mg (30 mg Intramuscular Given 08/03/22 1421)    Initial Impression / Assessment and Plan / UC Course  I have reviewed the triage vital signs and the nursing notes.  Pertinent labs & imaging results that were available during my care of the patient were reviewed by me and considered in my medical decision making (see chart for details).   1.  Acute bronchitis, shortness of breath Presentation is consistent with acute viral bronchitis that will likely improve with use of steroid and cough medications along with rest and increase fluid intake over the next few days.  Deferred imaging based on stable cardiopulmonary exam and hemodynamically stable vital signs. Patient is nontoxic in appearance and is oxygenating well on room air without any new oxygen requirement.  Given albuterol inhaler 2 puffs in clinic for shortness of breath and wheeze.  Patient given ketorolac 30 mg IM for headache.  Reviewed previous labs showing normal renal function. Tessalon perles and guaifenesin sent to pharmacy for symptomatic relief to be taken as prescribed. No NSAIDs while taking steroid. Advised to push fluids to stay well hydrated.   Discussed physical exam and available lab work findings in clinic with patient.  Counseled patient regarding appropriate use of medications and potential side effects for all medications recommended or prescribed today. Discussed red flag signs and symptoms of worsening condition,when to call the PCP office, return to urgent care, and when to seek higher level of care in the emergency department. Patient verbalizes understanding and agreement with plan. All questions answered. Patient discharged in stable condition.    Final Clinical Impressions(s) / UC Diagnoses   Final diagnoses:  Acute  bronchitis, unspecified organism     Discharge Instructions      You have bronchitis which is inflammation of the upper airways in your lungs due to a virus. The following medicines will help with your symptoms.   - You may use albuterol inhaler 1 to 2 puffs every 4-6 hours as needed for cough, shortness of breath, and wheezing. - Take cough medicines as prescribed.  - I gave you medicine in the clinic to help with headache. Do not take ibuprofen until tomorrow since I gave you this medicine in the clinic.  - Tessalon perles every 8 hours as needed for cough. - Continue using over the counter medicines as needed as directed. Plain mucinex (guaifenesin) over the counter may further help breakup mucus and help with symptoms.   If you develop any new or worsening symptoms or do not improve in  the next 2 to 3 days, please return.  If your symptoms are severe, please go to the emergency room.  Follow-up with your primary care provider for further evaluation and management of your symptoms as well as ongoing wellness visits.  I hope you feel better!    ED Prescriptions     Medication Sig Dispense Auth. Provider   guaiFENesin (MUCINEX) 600 MG 12 hr tablet Take 2 tablets (1,200 mg total) by mouth 2 (two) times daily. 30 tablet Reita May M, FNP   benzonatate (TESSALON) 100 MG capsule Take 1 capsule (100 mg total) by mouth every 8 (eight) hours. 21 capsule Carlisle Beers, FNP      PDMP not reviewed this encounter.   Carlisle Beers, [place] 08/03/22 [phone]

## 2022-08-03 NOTE — Telephone Encounter (Signed)
Requested medication (s) are due for refill today:   Not sure  Requested medication (s) are on the active medication list:   Yes  Future visit scheduled:      Last ordered: 07/23/2022 #12, 0 refills by another provider.   Not  from [facility]   Can't tell if she is taking this or not.  Requested Prescriptions  Pending Prescriptions Disp Refills   Vitamin D, Ergocalciferol, (DRISDOL) 1.25 MG (50000 UNIT) CAPS capsule 12 capsule 0    Sig: Take 1 capsule (50,000 Units total) by mouth every 7 (seven) days.     There is no refill protocol information for this order

## 2022-08-03 NOTE — ED Triage Notes (Signed)
Pt states cough,sore throat and and bilateral ear pain for the past 6 days.

## 2022-08-07 ENCOUNTER — Encounter (INDEPENDENT_AMBULATORY_CARE_PROVIDER_SITE_OTHER): Payer: Self-pay | Admitting: Internal Medicine

## 2022-08-07 ENCOUNTER — Other Ambulatory Visit (INDEPENDENT_AMBULATORY_CARE_PROVIDER_SITE_OTHER): Payer: Self-pay | Admitting: Internal Medicine

## 2022-08-07 ENCOUNTER — Other Ambulatory Visit: Payer: Self-pay

## 2022-08-07 ENCOUNTER — Other Ambulatory Visit: Payer: Self-pay | Admitting: Internal Medicine

## 2022-08-07 ENCOUNTER — Ambulatory Visit (INDEPENDENT_AMBULATORY_CARE_PROVIDER_SITE_OTHER): Payer: 59 | Admitting: Internal Medicine

## 2022-08-07 VITALS — BP 126/84 | HR 77 | Temp 98.3°F | Ht 65.0 in | Wt 309.0 lb

## 2022-08-07 DIAGNOSIS — R7303 Prediabetes: Secondary | ICD-10-CM

## 2022-08-07 DIAGNOSIS — M545 Low back pain, unspecified: Secondary | ICD-10-CM

## 2022-08-07 DIAGNOSIS — Z6841 Body Mass Index (BMI) 40.0 and over, adult: Secondary | ICD-10-CM

## 2022-08-07 DIAGNOSIS — E669 Obesity, unspecified: Secondary | ICD-10-CM

## 2022-08-07 DIAGNOSIS — R600 Localized edema: Secondary | ICD-10-CM | POA: Diagnosis not present

## 2022-08-07 DIAGNOSIS — F32A Depression, unspecified: Secondary | ICD-10-CM

## 2022-08-07 MED ORDER — OZEMPIC (0.25 OR 0.5 MG/DOSE) 2 MG/3ML ~~LOC~~ SOPN
0.2500 mg | PEN_INJECTOR | SUBCUTANEOUS | 0 refills | Status: DC
Start: 2022-08-07 — End: 2022-09-19
  Filled 2022-08-07: qty 3, 56d supply, fill #0

## 2022-08-07 NOTE — Assessment & Plan Note (Signed)
Patient is currently on metformin twice a day she is also receiving topiramate by her neurologist.  Because of the decrease in bicarbonate we will discontinue metformin as the combination of both may increase the risk of metabolic acidosis.  I do want to check a CMP again in 2 weeks.  I also advised patient that she needs to take her topiramate as instructed by her neurologist daily and not as needed as this may result in withdrawal seizures.  This medication may also be beneficial in weight loss.

## 2022-08-07 NOTE — Assessment & Plan Note (Addendum)
Improved.  Using furosemide 1 tablet 2-3 times a day as needed.  Reviewed most recent ProBNP within normal limits.

## 2022-08-07 NOTE — Assessment & Plan Note (Addendum)
She has had a progressive increase of hemoglobin A1c now at 6.3 with very high insulin levels.  She had been on metformin now twice a day for a few months but because of decrease in bicarbonate we are discontinuing metformin as this increases the risk of metabolic acidosis when used with topiramate..  She does benefit from pharmacoprophylaxis that she is high risk for progressing to diabetes.  After discussion of benefits and side effects she will be started on semaglutide 0.25 mg once a week.  This will also help her with management of her obesity.

## 2022-08-07 NOTE — Progress Notes (Signed)
Office: [phone]  /  Fax: [phone]  WEIGHT SUMMARY AND BIOMETRICS  Vitals Temp: 98.3 F (36.8 C) BP: 126/84 Pulse Rate: 77 SpO2: 100 %   Anthropometric Measurements Height: 5\' 5"  (1.651 m) Weight: (!) 309 lb (140.2 kg) BMI (Calculated): 51.42 Weight at Last Visit: 310 lb Weight Lost Since Last Visit: 1 lb Weight Gained Since Last Visit: 0 Starting Weight: 321 lb Total Weight Loss (lbs): 12 lb (5.443 kg) Peak Weight: 321 lb   Body Composition  Body Fat %: 54.4 % Fat Mass (lbs): 168.4 lbs Muscle Mass (lbs): 134 lbs Total Body Water (lbs): 102.8 lbs Visceral Fat Rating : 20    No data recorded Today's Visit #: 8  Starting Date: 03/14/22   HPI  Chief Complaint: OBESITY  Jenna [name] is here to discuss her progress with her obesity treatment plan. She is on the the Category 3 Plan and states she is following her eating plan approximately 60 % of the time. She states she is exercising walking 10-30 minutes 5 times per week.  Interval History:  Since last office visit she has [x]  lost []  maintained [] gained weight.  She has noticed decrease in appetite with increased metformin and not feeling well.  Last office visit she also presented with lower extremity edema and we had checked a proBNP which was normal.  She did have a decrease in bicarb.  She has been taking topiramate on and off prescribed by her neurologist for migraines which we advised her not to do.  Adherence to nutrition plan :  []  Excellent []  Good []  Fair []  Suboptimal [x]  Variable []  Gradual implementation [] Has not started implementation  Nutritional: She has been working on increasing protein intake at every meal  Orexigenic Control: [x] Denies [] Reports problems with appetite and hunger signals.  [x] Denies [] Reports problems with satiety and satiation.  [x] Denies [] Reports problems with eating patterns and portion control.  [x] Denies [] Reports strong cravings for highly palatable  foods [x] Denies [] Reports problems with feeling restricted or deprived  24 hour recall: protein shake, slept through lunch, dinner :mashed potatoes, one pork chop, green beans, corn. Snacks sugar free capri sun , peanut butter and jelly sandwich  Stress levels: []  Low [x] Medium [] High. Also not sleeping well. Gets around 5 hours   Barriers identified: cost of healthy foods, having difficulty preparing healthy meals, having difficulty with meal prep and planning, cost of medication, exposure to enticing environments and or relationships, and medical comorbidities.   Pharmacotherapy for weight loss: She is currently taking Metformin (off label use for incretin effect and / or insulin resistance and / or diabetes prevention)  and Topiramate (off label use, single agent).    ASSESSMENT AND PLAN  TREATMENT PLAN FOR OBESITY:  Recommended Dietary Goals  Mertice is currently in the action stage of change. As such, her goal is to continue weight management plan. She has agreed to: continue current plan  Behavioral Intervention  We discussed the following Behavioral Modification Strategies today: increasing lean protein intake, decreasing simple carbohydrates , increasing vegetables, increasing lower glycemic fruits, increasing fiber rich foods, increasing water intake, work on tracking and journaling calories using tracking application, reading food labels , and planning for success.  Additional resources provided today:  Dumbbell exercises  Recommended Physical Activity Goals  [name] has been advised to work up to 150 minutes of moderate intensity aerobic activity a week and strengthening exercises 2-3 times per week for cardiovascular health, weight loss maintenance and preservation of muscle mass.   She  has agreed to :  Think about ways to increase daily physical activity and overcoming barriers to exercise  Pharmacotherapy We have discussed various medication options to help  Sakari with her weight loss efforts and we both agreed to : We will discontinue metformin as we are noticing a decrease in bicarbonate which may also be related to topiramate.  I will like for her to be consistent with the intake of her topiramate which is prescribed by her neurologist for her migraines.  We may be able to build on that.  ASSOCIATED CONDITIONS ADDRESSED TODAY  Obesity with current BMI of 50 Assessment & Plan: Patient is currently on metformin twice a day she is also receiving topiramate by her neurologist.  Because of the decrease in bicarbonate we will discontinue metformin as the combination of both may increase the risk of metabolic acidosis.  I do want to check a CMP again in 2 weeks.  I also advised patient that she needs to take her topiramate as instructed by her neurologist daily and not as needed as this may result in withdrawal seizures.  This medication may also be beneficial in weight loss.   Bilateral leg edema Assessment & Plan: Improved.  Using furosemide 1 tablet 2-3 times a day as needed.  Reviewed most recent ProBNP within normal limits.    Prediabetes Assessment & Plan: She has had a progressive increase of hemoglobin A1c now at 6.3 with very high insulin levels.  She had been on metformin now twice a day for a few months but because of decrease in bicarbonate we are discontinuing metformin as this increases the risk of metabolic acidosis when used with topiramate..  She does benefit from pharmacoprophylaxis that she is high risk for progressing to diabetes.  After discussion of benefits and side effects she will be started on semaglutide 0.25 mg once a week.  This will also help her with management of her obesity.  Orders: -     Ozempic (0.25 or 0.5 MG/DOSE); Inject 0.25 mg into the skin once a week.  Dispense: 3 mL; Refill: 0    PHYSICAL EXAM:  Blood pressure 126/84, pulse 77, temperature 98.3 F (36.8 C), height 5\' 5"  (1.651 m), weight (!) 309 lb  (140.2 kg), last menstrual period 06/11/2022, SpO2 100 %. Body mass index is 51.42 kg/m.  General: She is overweight, cooperative, alert, well developed, and in no acute distress. PSYCH: Has normal mood, affect and thought process.   HEENT: EOMI, sclerae are anicteric. Lungs: Normal breathing effort, no conversational dyspnea. Extremities: No edema.  Neurologic: No gross sensory or motor deficits. No tremors or fasciculations noted.    DIAGNOSTIC DATA REVIEWED:  BMET    Component Value Date/Time   NA 140 07/23/2022 1213   K 4.7 07/23/2022 1213   CL 108 (H) 07/23/2022 1213   CO2 19 (L) 07/23/2022 1213   GLUCOSE 105 (H) 07/23/2022 1213   GLUCOSE 118 (H) 12/20/2021 1430   BUN 8 07/23/2022 1213   CREATININE 0.89 07/23/2022 1213   CALCIUM 9.0 07/23/2022 1213   GFRNONAA 55 (L) 12/20/2021 1430   GFRAA >60 09/25/2019 1548   EGFR 79 07/23/2022 1213   Lab Results  Component Value Date   HGBA1C 6.3 (H) 03/14/2022   HGBA1C 5.6 12/03/2017   Lab Results  Component Value Date   INSULIN 41.6 (H) 03/14/2022   INSULIN 7.4 12/08/2018   Lab Results  Component Value Date   TSH 1.655 12/20/2021   CBC    Component  Value Date/Time   WBC 8.1 12/20/2021 1430   RBC 4.57 12/20/2021 1430   HGB 12.5 12/20/2021 1430   HGB 12.5 01/23/2021 1525   HCT 38.9 12/20/2021 1430   HCT 37.4 01/23/2021 1525   PLT 333 12/20/2021 1430   PLT 284 01/23/2021 1525   MCV 85.1 12/20/2021 1430   MCV 88 01/23/2021 1525   MCH 27.4 12/20/2021 1430   MCHC 32.1 12/20/2021 1430   RDW 14.6 12/20/2021 1430   RDW 15.1 01/23/2021 1525   Iron Studies    Component Value Date/Time   IRON 53 04/02/2019 1510   TIBC 360 04/02/2019 1510   FERRITIN 26 04/02/2019 1510   IRONPCTSAT 15 04/02/2019 1510   Lipid Panel     Component Value Date/Time   CHOL 140 03/14/2022 1030   TRIG 68 03/14/2022 1030   HDL 46 03/14/2022 1030   CHOLHDL 3.8 10/06/2020 1458   LDLCALC 80 03/14/2022 1030   Hepatic Function Panel      Component Value Date/Time   PROT 6.8 07/23/2022 1213   ALBUMIN 3.9 07/23/2022 1213   AST 24 07/23/2022 1213   ALT 28 07/23/2022 1213   ALKPHOS 81 07/23/2022 1213   BILITOT 0.4 07/23/2022 1213      Component Value Date/Time   TSH 1.655 12/20/2021 1430   TSH 1.430 01/23/2021 1525   Nutritional Lab Results  Component Value Date   VD25OH 28.3 (L) 07/23/2022   VD25OH 14.6 (L) 03/14/2022   VD25OH 14.8 (L) 01/23/2021     Return in about 2 weeks (around 08/21/2022) for For Weight Mangement with Dr. Rikki Spearing.[name] She was informed of the importance of frequent follow up visits to maximize her success with intensive lifestyle modifications for her multiple health conditions.   ATTESTASTION STATEMENTS:  Reviewed by clinician on day of visit: allergies, medications, problem list, medical history, surgical history, family history, social history, and previous encounter notes.     Worthy Rancher, MD

## 2022-08-08 ENCOUNTER — Other Ambulatory Visit: Payer: Self-pay

## 2022-08-08 ENCOUNTER — Encounter: Payer: Self-pay | Admitting: Pharmacist

## 2022-08-10 ENCOUNTER — Encounter: Payer: 59 | Admitting: Internal Medicine

## 2022-08-14 ENCOUNTER — Other Ambulatory Visit: Payer: Self-pay

## 2022-08-19 ENCOUNTER — Other Ambulatory Visit (INDEPENDENT_AMBULATORY_CARE_PROVIDER_SITE_OTHER): Payer: Self-pay | Admitting: Internal Medicine

## 2022-08-19 DIAGNOSIS — R600 Localized edema: Secondary | ICD-10-CM

## 2022-08-28 ENCOUNTER — Encounter (INDEPENDENT_AMBULATORY_CARE_PROVIDER_SITE_OTHER): Payer: Self-pay | Admitting: Internal Medicine

## 2022-08-28 ENCOUNTER — Ambulatory Visit (INDEPENDENT_AMBULATORY_CARE_PROVIDER_SITE_OTHER): Payer: 59 | Admitting: Internal Medicine

## 2022-08-28 VITALS — BP 125/74 | HR 90 | Temp 98.3°F | Ht 65.0 in | Wt 305.0 lb

## 2022-08-28 DIAGNOSIS — R7303 Prediabetes: Secondary | ICD-10-CM | POA: Diagnosis not present

## 2022-08-28 DIAGNOSIS — E669 Obesity, unspecified: Secondary | ICD-10-CM

## 2022-08-28 DIAGNOSIS — Z6841 Body Mass Index (BMI) 40.0 and over, adult: Secondary | ICD-10-CM | POA: Diagnosis not present

## 2022-08-28 NOTE — Progress Notes (Signed)
Office: [phone]  /  Fax: [phone]  WEIGHT SUMMARY AND BIOMETRICS  Vitals Temp: 98.3 F (36.8 C) BP: 125/74 Pulse Rate: 90 SpO2: 97 %   Anthropometric Measurements Height: 5\' 5"  (1.651 m) Weight: (!) 305 lb (138.3 kg) BMI (Calculated): 50.75 Weight at Last Visit: 309 lb Weight Lost Since Last Visit: 4 lb Starting Weight: 321 lb Total Weight Loss (lbs): 16 lb (7.258 kg) Peak Weight: 321 lb   Body Composition  Body Fat %: 53.9 % Fat Mass (lbs): 164.8 lbs Muscle Mass (lbs): 133.8 lbs Total Body Water (lbs): 102 lbs Visceral Fat Rating : 20    No data recorded Today's Visit #: 9  Starting Date: 03/14/22   HPI  Chief Complaint: OBESITY  Jenna [name] is here to discuss her progress with her obesity treatment plan. She is on the the Category 3 Plan and states she is following her eating plan approximately 80 % of the time. She states she is exercising 30 minutes 2 times per week.  Interval History:  Since last office visit she has lost 4 lbs. She reports good adherence to reduced calorie nutritional plan. She has been working on reading food labels, not skipping meals, increasing protein intake at every meal, eating more fruits, eating more vegetables, drinking more water, journaling and tracking calories, and making healthier choices  Orixegenic Control: Reports problems with appetite and hunger signals.  Denies problems with satiety and satiation.  Denies problems with eating patterns and portion control.  Reports abnormal cravings for reeses cups Denies feeling deprived or restricted.   Barriers identified:  Strong cravings for sweets particularly around her cycle .   Pharmacotherapy for weight loss: She is currently taking  topiramate and metformin .    ASSESSMENT AND PLAN  TREATMENT PLAN FOR OBESITY:  Recommended Dietary Goals  [name] is currently in the action stage of change. As such, her goal is to continue weight management plan. She has  agreed to: continue current plan  Behavioral Intervention  We discussed the following Behavioral Modification Strategies today: increasing lean protein intake, decreasing simple carbohydrates , increasing vegetables, increasing lower glycemic fruits, increasing water intake, continue to practice mindfulness when eating, planning for success, and we discussed cognitive restructuring around how to treat cravings around her menses.  Additional resources provided today: None  Recommended Physical Activity Goals  [name] has been advised to work up to 150 minutes of moderate intensity aerobic activity a week and strengthening exercises 2-3 times per week for cardiovascular health, weight loss maintenance and preservation of muscle mass.   She has agreed to :  Think about ways to increase daily physical activity and overcoming barriers to exercise  Pharmacotherapy We discussed various medication options to help [name] with her weight loss efforts and we both agreed to : start anti-obesity medication. In addition to reduced calorie nutrition plan (RCNP), behavioral strategies and physical activity, [name] would benefit from pharmacotherapy to assist with hunger signals, satiety and cravings. This will reduce obesity-related health risks by inducing weight loss, and help reduce food consumption and adherence to [facility]) . It may also improve QOL by improving self-confidence and reduce the  setbacks associated with metabolic adaptations.  After discussion of treatment options, mechanisms of action, benefits, side effects, contraindications and shared decision making she is agreeable to starting semaglutide 0.25 mg once a week.  I personally instructed patient on use of device and administer first dose here in the office.  Using teach back method she was able to demonstrate  proficiency.  Patient also made aware that medication is indicated for long-term management of obesity and the risk of weight regain  following discontinuation of treatment and hence the importance of adhering to medical weight loss plan.    We also revisited common side effects and the importance of adhering to proper eating habits and macronutrient composition.  ASSOCIATED CONDITIONS ADDRESSED TODAY  Obesity with current BMI of 50  Prediabetes Assessment & Plan: She has had a progressive increase of hemoglobin A1c now at 6.3 with very high insulin levels.  She had been on metformin now twice a day for a few months but because of decrease in bicarbonate we are discontinuing metformin as this increases the risk of metabolic acidosis when used with topiramate..  She does benefit from pharmacoprophylaxis that she is high risk for progressing to diabetes.  After discussion of benefits and side effects she will be started on semaglutide 0.25 mg once a week for 4 weeks.  First dose was administered today without any complications.     PHYSICAL EXAM:  Blood pressure 125/74, pulse 90, temperature 98.3 F (36.8 C), height 5\' 5"  (1.651 m), weight (!) 305 lb (138.3 kg), last menstrual period 08/23/2022, SpO2 97 %. Body mass index is 50.75 kg/m.  General: She is overweight, cooperative, alert, well developed, and in no acute distress. PSYCH: Has normal mood, affect and thought process.   HEENT: EOMI, sclerae are anicteric. Lungs: Normal breathing effort, no conversational dyspnea. Extremities: No edema.  Neurologic: No gross sensory or motor deficits. No tremors or fasciculations noted.    DIAGNOSTIC DATA REVIEWED:  BMET    Component Value Date/Time   NA 140 07/23/2022 1213   K 4.7 07/23/2022 1213   CL 108 (H) 07/23/2022 1213   CO2 19 (L) 07/23/2022 1213   GLUCOSE 105 (H) 07/23/2022 1213   GLUCOSE 118 (H) 12/20/2021 1430   BUN 8 07/23/2022 1213   CREATININE 0.89 07/23/2022 1213   CALCIUM 9.0 07/23/2022 1213   GFRNONAA 55 (L) 12/20/2021 1430   GFRAA >60 09/25/2019 1548   Lab Results  Component Value Date    HGBA1C 6.3 (H) 03/14/2022   HGBA1C 5.6 12/03/2017   Lab Results  Component Value Date   INSULIN 41.6 (H) 03/14/2022   INSULIN 7.4 12/08/2018   Lab Results  Component Value Date   TSH 1.655 12/20/2021   CBC    Component Value Date/Time   WBC 8.1 12/20/2021 1430   RBC 4.57 12/20/2021 1430   HGB 12.5 12/20/2021 1430   HGB 12.5 01/23/2021 1525   HCT 38.9 12/20/2021 1430   HCT 37.4 01/23/2021 1525   PLT 333 12/20/2021 1430   PLT 284 01/23/2021 1525   MCV 85.1 12/20/2021 1430   MCV 88 01/23/2021 1525   MCH 27.4 12/20/2021 1430   MCHC 32.1 12/20/2021 1430   RDW 14.6 12/20/2021 1430   RDW 15.1 01/23/2021 1525   Iron Studies    Component Value Date/Time   IRON 53 04/02/2019 1510   TIBC 360 04/02/2019 1510   FERRITIN 26 04/02/2019 1510   IRONPCTSAT 15 04/02/2019 1510   Lipid Panel     Component Value Date/Time   CHOL 140 03/14/2022 1030   TRIG 68 03/14/2022 1030   HDL 46 03/14/2022 1030   CHOLHDL 3.8 10/06/2020 1458   LDLCALC 80 03/14/2022 1030   Hepatic Function Panel     Component Value Date/Time   PROT 6.8 07/23/2022 1213   ALBUMIN 3.9 07/23/2022 1213   AST 24  07/23/2022 1213   ALT 28 07/23/2022 1213   ALKPHOS 81 07/23/2022 1213   BILITOT 0.4 07/23/2022 1213      Component Value Date/Time   TSH 1.655 12/20/2021 1430   TSH 1.430 01/23/2021 1525   Nutritional Lab Results  Component Value Date   VD25OH 28.3 (L) 07/23/2022   VD25OH 14.6 (L) 03/14/2022   VD25OH 14.8 (L) 01/23/2021     Return in about 3 weeks (around 09/18/2022) for For Weight Mangement with Dr. Rikki Spearing.[name] She was informed of the importance of frequent follow up visits to maximize her success with intensive lifestyle modifications for her multiple health conditions.   ATTESTASTION STATEMENTS:  Reviewed by clinician on day of visit: allergies, medications, problem list, medical history, surgical history, family history, social history, and previous encounter notes.     Worthy Rancher, MD

## 2022-08-28 NOTE — Assessment & Plan Note (Signed)
She has had a progressive increase of hemoglobin A1c now at 6.3 with very high insulin levels.  She had been on metformin now twice a day for a few months but because of decrease in bicarbonate we are discontinuing metformin as this increases the risk of metabolic acidosis when used with topiramate..  She does benefit from pharmacoprophylaxis that she is high risk for progressing to diabetes.  After discussion of benefits and side effects she will be started on semaglutide 0.25 mg once a week for 4 weeks.  First dose was administered today without any complications.

## 2022-09-19 ENCOUNTER — Other Ambulatory Visit: Payer: Self-pay

## 2022-09-19 ENCOUNTER — Other Ambulatory Visit: Payer: Self-pay | Admitting: Internal Medicine

## 2022-09-19 DIAGNOSIS — M545 Low back pain, unspecified: Secondary | ICD-10-CM

## 2022-09-19 DIAGNOSIS — F32A Depression, unspecified: Secondary | ICD-10-CM

## 2022-09-19 MED ORDER — DULOXETINE HCL 60 MG PO CPEP
60.0000 mg | ORAL_CAPSULE | Freq: Every day | ORAL | 0 refills | Status: DC
Start: 2022-09-19 — End: 2022-12-23
  Filled 2022-09-19: qty 30, 30d supply, fill #0

## 2022-09-20 ENCOUNTER — Other Ambulatory Visit: Payer: Self-pay

## 2022-09-20 ENCOUNTER — Encounter (INDEPENDENT_AMBULATORY_CARE_PROVIDER_SITE_OTHER): Payer: Self-pay | Admitting: Internal Medicine

## 2022-09-20 ENCOUNTER — Ambulatory Visit (INDEPENDENT_AMBULATORY_CARE_PROVIDER_SITE_OTHER): Payer: 59 | Admitting: Internal Medicine

## 2022-09-20 VITALS — BP 122/85 | HR 92 | Temp 98.2°F | Ht 65.0 in | Wt 306.0 lb

## 2022-09-20 DIAGNOSIS — R600 Localized edema: Secondary | ICD-10-CM

## 2022-09-20 DIAGNOSIS — Z6841 Body Mass Index (BMI) 40.0 and over, adult: Secondary | ICD-10-CM | POA: Diagnosis not present

## 2022-09-20 DIAGNOSIS — R7303 Prediabetes: Secondary | ICD-10-CM

## 2022-09-20 MED ORDER — OZEMPIC (0.25 OR 0.5 MG/DOSE) 2 MG/3ML ~~LOC~~ SOPN
0.5000 mg | PEN_INJECTOR | SUBCUTANEOUS | 0 refills | Status: DC
Start: 2022-09-20 — End: 2022-11-08
  Filled 2022-09-20: qty 3, 28d supply, fill #0

## 2022-09-20 NOTE — Progress Notes (Signed)
Office: [phone]  /  Fax: [phone]  WEIGHT SUMMARY AND BIOMETRICS  Vitals Temp: 98.2 F (36.8 C) BP: 122/85 Pulse Rate: 92 SpO2: 100 %   Anthropometric Measurements Height: 5\' 5"  (1.651 m) Weight: (!) 306 lb (138.8 kg) BMI (Calculated): 50.92 Weight at Last Visit: 305 lb Weight Lost Since Last Visit: 0 Weight Gained Since Last Visit: 1 lb Starting Weight: 321 lb Total Weight Loss (lbs): 15 lb (6.804 kg) Peak Weight: 321 lb   Body Composition  Body Fat %: 51.4 % Fat Mass (lbs): 157.4 lbs Muscle Mass (lbs): 141.4 lbs Total Body Water (lbs): 100.8 lbs Visceral Fat Rating : 19    No data recorded Today's Visit #: 10  Starting Date: 03/14/22   HPI  Chief Complaint: OBESITY  Jenna [name] is here to discuss her progress with her obesity treatment plan. She is on the the Category 3 Plan and states she is following her eating plan approximately 96 % of the time. She states she is exercising from the plan you gave her 7-20 minutes 2-3 times per week.  Interval History:  Since last office visit she has lost 1 lb. her BIA suggest an increase in muscle mass and increased in body fat. She reports good adherence to reduced calorie nutritional plan. She has been working on not skipping meals, increasing protein intake at every meal, eating more vegetables, drinking more water, avoiding and or reducing liquid calories, making healthier choices, and begun to exercise  Orixegenic Control: Reports problems with appetite and hunger signals.  Denies problems with satiety and satiation.  Denies problems with eating patterns and portion control.  Reports abnormal cravings. Denies feeling deprived or restricted.   Barriers identified: strong hunger signals and appetite, having difficulty preparing healthy meals, having difficulty with meal prep and planning, predilection for convenience or prepackaged foods, and presence of obesogenic drugs.   Pharmacotherapy for weight  loss: She is currently taking Topiramate (off label use, single agent) without side effects. and prescribed by neurologist for headaches and on semaglutide 0.25 mg once a week with the primary indication of prediabetes .    ASSESSMENT AND PLAN  TREATMENT PLAN FOR OBESITY:  Recommended Dietary Goals  [name] is currently in the action stage of change. As such, her goal is to continue weight management plan. She has agreed to: continue current plan  Behavioral Intervention  We discussed the following Behavioral Modification Strategies today: increasing lean protein intake, decreasing simple carbohydrates , increasing vegetables, increasing lower glycemic fruits, increasing fiber rich foods, increasing water intake, continue to practice mindfulness when eating, and planning for success.  Additional resources provided today: None  Recommended Physical Activity Goals  [name] has been advised to work up to 150 minutes of moderate intensity aerobic activity a week and strengthening exercises 2-3 times per week for cardiovascular health, weight loss maintenance and preservation of muscle mass.   She has agreed to :  Think about ways to increase daily physical activity and overcoming barriers to exercise and Increase physical activity in their day and reduce sedentary time (increase NEAT).  Pharmacotherapy We discussed various medication options to help Victoria with her weight loss efforts and we both agreed to : increase semaglutide to 0.5 mg once a week  ASSOCIATED CONDITIONS ADDRESSED TODAY  Class 3 severe obesity without serious comorbidity with body mass index (BMI) of 50.0 to 59.9 in adult, unspecified obesity type (HCC)  Bilateral leg edema Assessment & Plan: Improved.  Continue to use furosemide for 1 to  2 days as needed.   Prediabetes Assessment & Plan: She has had a progressive increase of hemoglobin A1c now at 6.3 with very high insulin levels.  She had been on metformin  now twice a day for a few months but because of decrease in bicarbonate medication was discontinued as may increase the risk of metabolic acidosis when used with topiramate..  She is now on semaglutide as she is high risk for progressing to diabetes.  We will increase medication to 0.5 mg once a week.  Patient again counseled on reducing simple and added sugars from her diet.  Orders: -     Ozempic (0.25 or 0.5 MG/DOSE); Inject 0.5 mg into the skin once a week.  Dispense: 3 mL; Refill: 0    PHYSICAL EXAM:  Blood pressure 122/85, pulse 92, temperature 98.2 F (36.8 C), height 5\' 5"  (1.651 m), weight (!) 306 lb (138.8 kg), last menstrual period 08/23/2022, SpO2 100%. Body mass index is 50.92 kg/m.  General: She is overweight, cooperative, alert, well developed, and in no acute distress. PSYCH: Has normal mood, affect and thought process.   HEENT: EOMI, sclerae are anicteric. Lungs: Normal breathing effort, no conversational dyspnea. Extremities: No edema.  Neurologic: No gross sensory or motor deficits. No tremors or fasciculations noted.    DIAGNOSTIC DATA REVIEWED:  BMET    Component Value Date/Time   NA 140 07/23/2022 1213   K 4.7 07/23/2022 1213   CL 108 (H) 07/23/2022 1213   CO2 19 (L) 07/23/2022 1213   GLUCOSE 105 (H) 07/23/2022 1213   GLUCOSE 118 (H) 12/20/2021 1430   BUN 8 07/23/2022 1213   CREATININE 0.89 07/23/2022 1213   CALCIUM 9.0 07/23/2022 1213   GFRNONAA 55 (L) 12/20/2021 1430   GFRAA >60 09/25/2019 1548   Lab Results  Component Value Date   HGBA1C 6.3 (H) 03/14/2022   HGBA1C 5.6 12/03/2017   Lab Results  Component Value Date   INSULIN 41.6 (H) 03/14/2022   INSULIN 7.4 12/08/2018   Lab Results  Component Value Date   TSH 1.655 12/20/2021   CBC    Component Value Date/Time   WBC 8.1 12/20/2021 1430   RBC 4.57 12/20/2021 1430   HGB 12.5 12/20/2021 1430   HGB 12.5 01/23/2021 1525   HCT 38.9 12/20/2021 1430   HCT 37.4 01/23/2021 1525   PLT 333  12/20/2021 1430   PLT 284 01/23/2021 1525   MCV 85.1 12/20/2021 1430   MCV 88 01/23/2021 1525   MCH 27.4 12/20/2021 1430   MCHC 32.1 12/20/2021 1430   RDW 14.6 12/20/2021 1430   RDW 15.1 01/23/2021 1525   Iron Studies    Component Value Date/Time   IRON 53 04/02/2019 1510   TIBC 360 04/02/2019 1510   FERRITIN 26 04/02/2019 1510   IRONPCTSAT 15 04/02/2019 1510   Lipid Panel     Component Value Date/Time   CHOL 140 03/14/2022 1030   TRIG 68 03/14/2022 1030   HDL 46 03/14/2022 1030   CHOLHDL 3.8 10/06/2020 1458   LDLCALC 80 03/14/2022 1030   Hepatic Function Panel     Component Value Date/Time   PROT 6.8 07/23/2022 1213   ALBUMIN 3.9 07/23/2022 1213   AST 24 07/23/2022 1213   ALT 28 07/23/2022 1213   ALKPHOS 81 07/23/2022 1213   BILITOT 0.4 07/23/2022 1213      Component Value Date/Time   TSH 1.655 12/20/2021 1430   TSH 1.430 01/23/2021 1525   Nutritional Lab Results  Component  Value Date   VD25OH 28.3 (L) 07/23/2022   VD25OH 14.6 (L) 03/14/2022   VD25OH 14.8 (L) 01/23/2021     Return in about 3 weeks (around 10/11/2022) for For Weight Mangement with Dr. Rikki Spearing.[name] She was informed of the importance of frequent follow up visits to maximize her success with intensive lifestyle modifications for her multiple health conditions.   ATTESTASTION STATEMENTS:  Reviewed by clinician on day of visit: allergies, medications, problem list, medical history, surgical history, family history, social history, and previous encounter notes.     Worthy Rancher, MD

## 2022-09-20 NOTE — Assessment & Plan Note (Signed)
She has had a progressive increase of hemoglobin A1c now at 6.3 with very high insulin levels.  She had been on metformin now twice a day for a few months but because of decrease in bicarbonate medication was discontinued as may increase the risk of metabolic acidosis when used with topiramate..  She is now on semaglutide as she is high risk for progressing to diabetes.  We will increase medication to 0.5 mg once a week.  Patient again counseled on reducing simple and added sugars from her diet.

## 2022-09-20 NOTE — Assessment & Plan Note (Signed)
Improved.  Continue to use furosemide for 1 to 2 days as needed.

## 2022-10-01 ENCOUNTER — Encounter: Payer: Self-pay | Admitting: Internal Medicine

## 2022-10-01 ENCOUNTER — Other Ambulatory Visit: Payer: Self-pay

## 2022-10-01 ENCOUNTER — Ambulatory Visit: Payer: 59 | Attending: Internal Medicine | Admitting: Internal Medicine

## 2022-10-01 DIAGNOSIS — Z0000 Encounter for general adult medical examination without abnormal findings: Secondary | ICD-10-CM | POA: Diagnosis not present

## 2022-10-01 DIAGNOSIS — Z2821 Immunization not carried out because of patient refusal: Secondary | ICD-10-CM

## 2022-10-01 DIAGNOSIS — E559 Vitamin D deficiency, unspecified: Secondary | ICD-10-CM

## 2022-10-01 DIAGNOSIS — F320 Major depressive disorder, single episode, mild: Secondary | ICD-10-CM | POA: Diagnosis not present

## 2022-10-01 MED ORDER — VITAMIN D (ERGOCALCIFEROL) 1.25 MG (50000 UNIT) PO CAPS
50000.0000 [IU] | ORAL_CAPSULE | ORAL | 1 refills | Status: DC
Start: 2022-10-01 — End: 2024-02-04
  Filled 2022-10-01 – 2022-12-23 (×2): qty 12, 84d supply, fill #0
  Filled 2023-03-18: qty 12, 84d supply, fill #1

## 2022-10-01 NOTE — Progress Notes (Signed)
Subjective:   Jenna [name] is a 52 y.o. female who presents for an Initial Medicare Annual Wellness Visit. Patient with history of morbid obesity, preDM, OA of the knees, chronic lower back pain.  Depression (on Cymbalta for LBP and mood), vitamin D deficiency   Visit Complete: In person  Patient Medicare AWV questionnaire was completed by the patient on 10/01/2022; I have confirmed that all information answered by patient is correct and no changes since this date.  Review of Systems    GI: Had colonoscopy.  Large adenomatous polyp removed.  Referred to Dr. Meridee Score to have repeat colonoscopy done for further removal of some of the polyp that was left.  This is coming up soon.   MSK: Still gets pain in the neck that radiates to the left arm.  She is seen the neurosurgeon.  He has recommended surgery.  She deferred on doing that for now.    Objective:    There were no vitals filed for this visit. There is no height or weight on file to calculate BMI.     10/01/2022   10:51 AM 07/19/2022    6:48 AM 09/27/2021   11:17 AM 06/30/2021    8:23 PM 02/15/2021   11:08 AM 09/17/2020    2:37 PM 09/25/2019   10:41 PM  Advanced Directives  Does Patient Have a Medical Advance Directive? No No No No No No No  Would patient like information on creating a medical advance directive? No - Patient declined No - Patient declined Yes (MAU/Ambulatory/Procedural Areas - Information given) Yes (MAU/Ambulatory/Procedural Areas - Information given) No - Patient declined  No - Patient declined    Current Medications (verified) Outpatient Encounter Medications as of 10/01/2022  Medication Sig   benzonatate (TESSALON) 100 MG capsule Take 1 capsule (100 mg total) by mouth every 8 (eight) hours.   DULoxetine (CYMBALTA) 60 MG capsule Take 1 capsule (60 mg total) by mouth daily for back pain and mood.   ferrous sulfate 325 (65 FE) MG tablet Take 325 mg by mouth every Wednesday.   fluticasone (FLONASE) 50 MCG/ACT  nasal spray Place 2 sprays into both nostrils daily. (Patient taking differently: Place 2 sprays into both nostrils daily as needed for allergies or rhinitis.)   furosemide (LASIX) 20 MG tablet Take 1 tablet (20 mg total) by mouth daily.   gabapentin (NEURONTIN) 300 MG capsule Take 1 capsule (300 mg total) by mouth 3 (three) times daily as needed.   hydrOXYzine (ATARAX) 25 MG tablet TAKE 1 TO 2 TABLETS BY MOUTH EVERY 6 TO 8 HOURS AS NEEDED FOR ITCHING   naproxen (NAPROSYN) 500 MG tablet Take 1 tablet (500 mg total) by mouth 2 (two) times daily with a meal as needed for pain.   rOPINIRole (REQUIP) 0.5 MG tablet Take 1 tablet (0.5 mg total) by mouth at bedtime.   Semaglutide,0.25 or 0.5MG /DOS, (OZEMPIC, 0.25 OR 0.5 MG/DOSE,) 2 MG/3ML SOPN Inject 0.5 mg into the skin once a week.   tiZANidine (ZANAFLEX) 4 MG tablet Take 1 tablet (4 mg total) by mouth every 8 (eight) hours as needed for muscle spasms.   topiramate (TOPAMAX) 25 MG tablet Take 25 mg (1 pill) at bedtime for one week, then increase to 50 mg (2 pills) at bedtime for one week, then take 75 mg (3 pills) at bedtime for one week, then take 100 mg (4 pills) at bedtime and continue on this dose (Patient taking differently: Take 75 mg by mouth at bedtime.)   [  DISCONTINUED] Vitamin D, Ergocalciferol, (DRISDOL) 1.25 MG (50000 UNIT) CAPS capsule Take 1 capsule (50,000 Units total) by mouth every 7 (seven) days.   Vitamin D, Ergocalciferol, (DRISDOL) 1.25 MG (50000 UNIT) CAPS capsule Take 1 capsule (50,000 Units total) by mouth every 7 (seven) days.   No facility-administered encounter medications on file as of 10/01/2022.    Allergies (verified) Diflucan [fluconazole]   History: Past Medical History:  Diagnosis Date   Arthritis    Back pain    Back pain    Chest pain    Depression    Edema of both lower extremities    Joint pain    Left arm numbness    Numbness in both hands    Osteoarthritis    Prediabetes    SOB (shortness of breath)     Vitamin D deficiency    Past Surgical History:  Procedure Laterality Date   CHOLECYSTECTOMY     COLONOSCOPY WITH PROPOFOL N/A 07/19/2022   Procedure: COLONOSCOPY WITH PROPOFOL;  Surgeon: Jenel Lucks, MD;  Location: [facility] ENDOSCOPY;  Service: Gastroenterology;  Laterality: N/A;   POLYPECTOMY  07/19/2022   Procedure: POLYPECTOMY;  Surgeon: Jenel Lucks, MD;  Location: [facility] ENDOSCOPY;  Service: Gastroenterology;;   SUBMUCOSAL LIFTING INJECTION  07/19/2022   Procedure: SUBMUCOSAL LIFTING INJECTION;  Surgeon: Jenel Lucks, MD;  Location: [facility] ENDOSCOPY;  Service: Gastroenterology;;   TUBAL LIGATION     Family History  Problem Relation Age of Onset   Colon polyps Mother    Diabetes Mother    Hypertension Mother    Hypertension Father    Breast cancer Neg Hx    Colon cancer Neg Hx    Esophageal cancer Neg Hx    Rectal cancer Neg Hx    Stomach cancer Neg Hx    Social History   Socioeconomic History   Marital status: Single    Spouse name: Not on file   Number of children: Not on file   Years of education: Not on file   Highest education level: Not on file  Occupational History   Occupation: Stay at home, disabled  Tobacco Use   Smoking status: Former    Current packs/day: 0.00    Types: Cigarettes    Quit date: 10/10/2021    Years since quitting: 0.9   Smokeless tobacco: Never  Vaping Use   Vaping status: Never Used  Substance and Sexual Activity   Alcohol use: No    Alcohol/week: 0.0 standard drinks of alcohol   Drug use: No   Sexual activity: Not on file  Other Topics Concern   Not on file  Social History Narrative   Not on file   Social Determinants of Health   Financial Resource Strain: Low Risk  (10/01/2022)   Overall Financial Resource Strain (CARDIA)    Difficulty of Paying Living Expenses: Not hard at all  Food Insecurity: No Food Insecurity (10/01/2022)   Hunger Vital Sign    Worried About Running Out of Food in the Last Year: Never true    Ran  Out of Food in the Last Year: Never true  Transportation Needs: No Transportation Needs (10/01/2022)   PRAPARE - Administrator, Civil Service (Medical): No    Lack of Transportation (Non-Medical): No  Physical Activity: Insufficiently Active (10/01/2022)   Exercise Vital Sign    Days of Exercise per Week: 2 days    Minutes of Exercise per Session: 10 min  Stress: No Stress Concern Present (10/01/2022)  [employer] of Occupational Health - Occupational Stress Questionnaire    Feeling of Stress : Not at all  Social Connections: Moderately Isolated (10/01/2022)   Social Connection and Isolation Panel [NHANES]    Frequency of Communication with Friends and Family: More than three times a week    Frequency of Social Gatherings with Friends and Family: Twice a week    Attends Religious Services: 1 to 4 times per year    Active Member of [employer] or Organizations: No    Attends Banker Meetings: Never    Marital Status: Divorced    Tobacco Counseling Quit 10/2021.   Clinical Intake:  Pain : No/denies pain     Diabetes: No PreDM:  working with MWM.  On Ozempic 0.5 mg.  Started at 321 lbs; last wgh in was at 305 lbs.  Tolerating Ozempic.  Eating smaller portions.  Wants to get to 245 lbs.    Interpreter Needed?: No  Activities of Daily Living    10/01/2022   10:49 AM  In your present state of health, do you have any difficulty performing the following activities:  Vision? 0  Difficulty concentrating or making decisions? 1  Walking or climbing stairs? 1  Dressing or bathing? 1  Doing errands, shopping? 1  Preparing Food and eating ? N  Using the Toilet? N  In the past six months, have you accidently leaked urine? Y  Do you have problems with loss of bowel control? N  Managing your Medications? N  Managing your Finances? N  Housekeeping or managing your Housekeeping? N    Patient Care Team: Marcine Matar, MD as PCP - General (Internal  Medicine) Harriet Masson, MD-medical weight management Tiajuana Amass, Md-gastroenterology [facility] Indicate any recent Medical Services you may have received from other than [facility] providers in the past year (date may be approximate).     Assessment:   This is a routine wellness examination for [place].  Hearing/Vision screen Vision Screening   Right eye Left eye Both eyes  Without correction     With correction 20/25 20/20 20/13     Dietary issues and exercise activities discussed:  PreDM:  working with MWM.  On Ozempic 0.5 mg.  Started at 321 lbs; last wgh in was at 305 lbs.  Tolerating Ozempic.  Eating smaller portions.  Wants to get to 245 lbs. -Working on trying to move more but issues with chronic LBP.  Not able to go as far and as fast.  Walks 3 days a wk through her house.     Goals Addressed             This Visit's Progress    Set My Weight Loss Goal       Would like to get to 270 pounds by the end of this year.      Depression Screen    10/01/2022   11:05 AM 09/27/2021   11:18 AM 08/03/2021    4:38 PM 07/11/2021    2:09 PM 05/23/2021    1:40 PM 10/06/2020    3:05 PM 01/21/2020    2:08 PM  PHQ 2/9 Scores  PHQ - 2 Score 2 2 3 3 3 1 1   PHQ- 9 Score 6 15 13 14 10 8 4    On Cymbalta She was doing therapy with the [facility] once a wk.  She left; plans to get set up with another therapist.    Fall Risk    10/01/2022  10:52 AM 10/01/2022   10:47 AM 09/27/2021   11:18 AM 08/03/2021    4:38 PM 05/23/2021    1:40 PM  Fall Risk   Falls in the past year? 0 0 0 0 0  Number falls in past yr: 0 0 0 0 0  Injury with Fall? 0 0  0 0  Risk for fall due to : No Fall Risks No Fall Risks   No Fall Risks    MEDICARE RISK AT HOME:   TIMED UP AND GO:  Was the test performed? Yes  Length of time to ambulate 10 feet: 12 sec Gait slow and steady without use of assistive device    Cognitive Function:    10/01/2022   11:06 AM  MMSE - Mini  Mental State Exam  Orientation to time 5  Orientation to Place 5  Registration 3  Attention/ Calculation 5  Recall 3  Language- name 2 objects 2  Language- repeat 1  Language- follow 3 step command 3  Language- read & follow direction 1  Write a sentence 1  Copy design 1  Total score 30        Immunizations Immunization History  Administered Date(s) Administered   Influenza, Seasonal, Injecte, Preservative Fre 01/05/2014   Pneumococcal Polysaccharide-23 01/05/2014    TDAP status: Due, Education has been provided regarding the importance of this vaccine. Advised may receive this vaccine at local pharmacy or Health Dept. Aware to provide a copy of the vaccination record if obtained from local pharmacy or Health Dept. Verbalized acceptance and understanding. DECLINES today  Flu Vaccine status: Up to date   Covid-19 vaccine status: Declined, Education has been provided regarding the importance of this vaccine but patient still declined. Advised may receive this vaccine at local pharmacy or Health Dept.or vaccine clinic. Aware to provide a copy of the vaccination record if obtained from local pharmacy or Health Dept. Verbalized acceptance and understanding.  Qualifies for Shingles Vaccine? No   Zostavax completed No   Shingrix Completed?: No.    Education has been provided regarding the importance of this vaccine. Patient has been advised to call insurance company to determine out of pocket expense if they have not yet received this vaccine. Advised may also receive vaccine at local pharmacy or Health Dept. Verbalized acceptance and understanding. Patient declines.  Screening Tests Health Maintenance  Topic Date Due   DTaP/Tdap/Td (1 - Tdap) Never done   COVID-19 Vaccine (1 - 2023-24 season) Never done   Zoster Vaccines- Shingrix (1 of 2) 10/01/2023 (Originally 09/07/2021)   INFLUENZA VACCINE  10/11/2022   Medicare Annual Wellness (AWV)  10/01/2023   MAMMOGRAM  11/24/2023    PAP SMEAR-Modifier  07/11/2024   Colonoscopy  07/18/2032   Hepatitis C Screening  Completed   HIV Screening  Completed   HPV VACCINES  Aged Out    Health Maintenance  Health Maintenance Due  Topic Date Due   DTaP/Tdap/Td (1 - Tdap) Never done   COVID-19 Vaccine (1 - 2023-24 season) Never done    Colorectal cancer screening: Type of screening: Colonoscopy. Completed 07/19/2022. Repeat every 5 years  Mammogram status: Completed 11/23/2021. Repeat every year    Lung Cancer Screening: (Low Dose CT Chest recommended if Age 37-80 years, 20 pack-year currently smoking OR have quit w/in 15years.) does not qualify.  Smoked for 30 years before quitting.  She was 1/2 pack a day smoker.  Lung Cancer Screening Referral: NA  Additional Screening:  Hepatitis C Screening: does qualify;  Completed 07/11/2021  Vision Screening: Recommended annual ophthalmology exams for early detection of glaucoma and other disorders of the eye. Is the patient up to date with their annual eye exam?  Yes  [facility] Who is the provider or what is the name of the office in which the patient attends annual eye exams?  [facility] If pt is not established with a provider, would they like to be referred to a provider to establish care?  N/A .   Dental Screening: Recommended annual dental exams for proper oral hygiene.  Has dental appt today.  Has dentures above and will get partials next yr   Community Resource Referral / Chronic Care Management: CRR required this visit?  No   CCM required this visit?  No     Plan:   1. Encounter for Medicare annual wellness exam   2. Morbid obesity (HCC) Encouraged her to continue healthy eating habits. Advised to consider water aerobics.  3. Major depressive disorder, single episode, mild (HCC) Continue Cymbalta.  4. Vitamin D deficiency - Vitamin D, Ergocalciferol, (DRISDOL) 1.25 MG (50000 UNIT) CAPS capsule; Take 1 capsule (50,000 Units total) by  mouth every 7 (seven) days.  Dispense: 12 capsule; Refill: 1  5. Tetanus, diphtheria, and acellular pertussis (Tdap) vaccination declined   I have personally reviewed and noted the following in the patient's chart:   Medical and social history Use of alcohol, tobacco or illicit drugs  Current medications and supplements including opioid prescriptions. Patient is not currently taking opioid prescriptions. Functional ability and status Nutritional status Physical activity Advanced directives List of other physicians Hospitalizations, surgeries, and ER visits in previous 12 months Vitals Screenings to include cognitive, depression, and falls Referrals and appointments  In addition, I have reviewed and discussed with patient certain preventive protocols, quality metrics, and best practice recommendations. A written personalized care plan for preventive services as well as general preventive health recommendations were provided to patient.     Jonah Blue, MD   10/01/2022   After Visit Summary: Given to pt  NA

## 2022-10-01 NOTE — Patient Instructions (Signed)
If you change your mind about getting the tetanus and shingles vaccine, you can get them at your local pharmacy. You will be due for your mammogram in September of this year. Look into water aerobics at the [facility]. Touch base with the [facility] about getting in with another therapist.

## 2022-10-09 ENCOUNTER — Ambulatory Visit: Payer: Self-pay | Admitting: Psychiatry

## 2022-10-11 ENCOUNTER — Encounter (INDEPENDENT_AMBULATORY_CARE_PROVIDER_SITE_OTHER): Payer: Self-pay | Admitting: Internal Medicine

## 2022-10-11 ENCOUNTER — Ambulatory Visit (INDEPENDENT_AMBULATORY_CARE_PROVIDER_SITE_OTHER): Payer: 59 | Admitting: Internal Medicine

## 2022-10-11 VITALS — BP 117/80 | HR 103 | Temp 98.3°F | Ht 65.0 in | Wt 305.0 lb

## 2022-10-11 DIAGNOSIS — R7303 Prediabetes: Secondary | ICD-10-CM

## 2022-10-11 DIAGNOSIS — G4733 Obstructive sleep apnea (adult) (pediatric): Secondary | ICD-10-CM

## 2022-10-11 DIAGNOSIS — Z6841 Body Mass Index (BMI) 40.0 and over, adult: Secondary | ICD-10-CM

## 2022-10-11 NOTE — Progress Notes (Signed)
Office: [phone]  /  Fax: [phone]  WEIGHT SUMMARY AND BIOMETRICS  Vitals Temp: 98.3 F (36.8 C) BP: 117/80 Pulse Rate: (!) 103 SpO2: 99 %   Anthropometric Measurements Height: 5\' 5"  (1.651 m) Weight: (!) 305 lb (138.3 kg) BMI (Calculated): 50.75 Weight at Last Visit: 306 lb Weight Lost Since Last Visit: 1 lb Weight Gained Since Last Visit: 0 lb Starting Weight: 321 lb Total Weight Loss (lbs): 16 lb (7.258 kg) Peak Weight: 321 lb   Body Composition  Body Fat %: 51.6 % Fat Mass (lbs): 157.6 lbs Muscle Mass (lbs): 140.6 lbs Total Body Water (lbs): 101.6 lbs Visceral Fat Rating : 19    No data recorded Today's Visit #: 11  Starting Date: 03/14/22   HPI  Chief Complaint: OBESITY  Jenna [name] is here to discuss her progress with her obesity treatment plan. She is on the the Category 3 Plan and states she is following her eating plan approximately 96 % of the time. She states she is exercising 20 minutes 2 times per week.  Interval History:  Since last office visit she has lost 1 lb. Has done 3 shots of .5mg  of semaglutide.Some nausea but no stomach pain or diarrhea. She reports good adherence to reduced calorie nutritional plan. She has been working on not skipping meals, increasing protein intake at every meal, and drinking more water  Orixegenic Control: Denies problems with appetite and hunger signals.  Denies problems with satiety and satiation.  Denies problems with eating patterns and portion control.  Denies abnormal cravings. Denies feeling deprived or restricted.   Barriers identified: low volume of physical acitivity.   Pharmacotherapy for weight loss: She is currently taking Ozempic with diabetes as the primary indication with adequate clinical response  and without side effects..    ASSESSMENT AND PLAN  TREATMENT PLAN FOR OBESITY:  Recommended Dietary Goals  Jenna [name] is currently in the action stage of change. As such, her goal is to  continue weight management plan. She has agreed to: continue current plan  Behavioral Intervention  We discussed the following Behavioral Modification Strategies today: increasing lean protein intake, decreasing simple carbohydrates , increasing vegetables, increasing lower glycemic fruits, increasing water intake, continue to practice mindfulness when eating, and planning for success.  Additional resources provided today: None  Recommended Physical Activity Goals  Jenna [name] has been advised to work up to 150 minutes of moderate intensity aerobic activity a week and strengthening exercises 2-3 times per week for cardiovascular health, weight loss maintenance and preservation of muscle mass.   She has agreed to :  Think about ways to increase daily physical activity and overcoming barriers to exercise and Increase physical activity in their day and reduce sedentary time (increase NEAT).  Pharmacotherapy We discussed various medication options to help Jenna [name] with her weight loss efforts and we both agreed to : continue current anti-obesity medication regimen  ASSOCIATED CONDITIONS ADDRESSED TODAY  Class 3 severe obesity without serious comorbidity with body mass index (BMI) of 50.0 to 59.9 in adult, unspecified obesity type Jenna [name]) Assessment & Plan: Jenna [name] has been working hard making nutritional adjustments.  She is targeting 1500 cal which should generate about a 500-calorie deficit based on her resting energy expenditure.  She is also on incretin therapy but rate of weight loss has been slower than expected.  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  She reports having a sleep study  about a year ago and was told had mild OSA.  Unfortunately I could not retrieve test results to personally review and interpret.  She does have a high risk phenotype and I wonder if she benefits from PAP therapy as she is  symptomatic.  She will try to obtain a copy of test results and bring at the next office visit.  She feels that she was not started on PAP therapy because of insurance issues at the time.  In order to troubleshoot I also recommend that she start to track and journal 3 to 4 days out of the week over the next 3 weeks.  I provided her with resources today and personally instruction on how to track using my net diary.  I recommend that she also use AI to find estimated calories on certain foods that may be hard to quantify.  If she is indeed staying within recommended calories and consuming the right amount of protein then she may need to focus on increasing volume of physical activity, this is usually the management of diet resistant obesity.   Prediabetes Assessment & Plan: She has had a progressive increase of hemoglobin A1c now at 6.3 with very high insulin levels.  She had been on metformin now twice a day for a few months but because of decrease in bicarbonate medication was discontinued as may increase the risk of metabolic acidosis when used with topiramate..  She is now on semaglutide as she is high risk for progressing to diabetes.  We will continue medication to 0.5 mg once a week.  We will assess clinical response and adjust at the next office visit.  Patient again counseled on reducing simple and added sugars from her diet.   OSA (obstructive sleep apnea)    PHYSICAL EXAM:  Blood pressure 117/80, pulse (!) 103, temperature 98.3 F (36.8 C), height 5\' 5"  (1.651 m), weight (!) 305 lb (138.3 kg), last menstrual period 09/03/2022, SpO2 99%. Body mass index is 50.75 kg/m.  General: She is overweight, cooperative, alert, well developed, and in no acute distress. PSYCH: Has normal mood, affect and thought process.   HEENT: EOMI, sclerae are anicteric. Lungs: Normal breathing effort, no conversational dyspnea. Extremities: No edema.  Neurologic: No gross sensory or motor deficits. No  tremors or fasciculations noted.    DIAGNOSTIC DATA REVIEWED:  BMET    Component Value Date/Time   NA 140 07/23/2022 1213   K 4.7 07/23/2022 1213   CL 108 (H) 07/23/2022 1213   CO2 19 (L) 07/23/2022 1213   GLUCOSE 105 (H) 07/23/2022 1213   GLUCOSE 118 (H) 12/20/2021 1430   BUN 8 07/23/2022 1213   CREATININE 0.89 07/23/2022 1213   CALCIUM 9.0 07/23/2022 1213   GFRNONAA 55 (L) 12/20/2021 1430   GFRAA >60 09/25/2019 1548   Lab Results  Component Value Date   HGBA1C 6.3 (H) 03/14/2022   HGBA1C 5.6 12/03/2017   Lab Results  Component Value Date   INSULIN 41.6 (H) 03/14/2022   INSULIN 7.4 12/08/2018   Lab Results  Component Value Date   TSH 1.655 12/20/2021   CBC    Component Value Date/Time   WBC 8.1 12/20/2021 1430   RBC 4.57 12/20/2021 1430   HGB 12.5 12/20/2021 1430   HGB 12.5 01/23/2021 1525   HCT 38.9 12/20/2021 1430   HCT 37.4 01/23/2021 1525   PLT 333 12/20/2021 1430   PLT 284 01/23/2021 1525   MCV 85.1 12/20/2021 1430   MCV 88 01/23/2021 1525  MCH 27.4 12/20/2021 1430   MCHC 32.1 12/20/2021 1430   RDW 14.6 12/20/2021 1430   RDW 15.1 01/23/2021 1525   Iron Studies    Component Value Date/Time   IRON 53 04/02/2019 1510   TIBC 360 04/02/2019 1510   FERRITIN 26 04/02/2019 1510   IRONPCTSAT 15 04/02/2019 1510   Lipid Panel     Component Value Date/Time   CHOL 140 03/14/2022 1030   TRIG 68 03/14/2022 1030   HDL 46 03/14/2022 1030   CHOLHDL 3.8 10/06/2020 1458   LDLCALC 80 03/14/2022 1030   Hepatic Function Panel     Component Value Date/Time   PROT 6.8 07/23/2022 1213   ALBUMIN 3.9 07/23/2022 1213   AST 24 07/23/2022 1213   ALT 28 07/23/2022 1213   ALKPHOS 81 07/23/2022 1213   BILITOT 0.4 07/23/2022 1213      Component Value Date/Time   TSH 1.655 12/20/2021 1430   TSH 1.430 01/23/2021 1525   Nutritional Lab Results  Component Value Date   VD25OH 28.3 (L) 07/23/2022   VD25OH 14.6 (L) 03/14/2022   VD25OH 14.8 (L) 01/23/2021      No follow-ups on file.[name] She was informed of the importance of frequent follow up visits to maximize her success with intensive lifestyle modifications for her multiple health conditions.   ATTESTASTION STATEMENTS:  Reviewed by clinician on day of visit: allergies, medications, problem list, medical history, surgical history, family history, social history, and previous encounter notes.     Worthy Rancher, MD

## 2022-10-12 NOTE — Assessment & Plan Note (Signed)
She has had a progressive increase of hemoglobin A1c now at 6.3 with very high insulin levels.  She had been on metformin now twice a day for a few months but because of decrease in bicarbonate medication was discontinued as may increase the risk of metabolic acidosis when used with topiramate..  She is now on semaglutide as she is high risk for progressing to diabetes.  We will continue medication to 0.5 mg once a week.  We will assess clinical response and adjust at the next office visit.  Patient again counseled on reducing simple and added sugars from her diet.

## 2022-10-12 NOTE — Assessment & Plan Note (Signed)
Jenna [name] has been working hard making nutritional adjustments.  She is targeting 1500 cal which should generate about a 500-calorie deficit based on her resting energy expenditure.  She is also on incretin therapy but rate of weight loss has been slower than expected.  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  She reports having a sleep study about a year ago and was told had mild OSA.  Unfortunately I could not retrieve test results to personally review and interpret.  She does have a high risk phenotype and I wonder if she benefits from PAP therapy as she is symptomatic.  She will try to obtain a copy of test results and bring at the next office visit.  She feels that she was not started on PAP therapy because of insurance issues at the time.  In order to troubleshoot I also recommend that she start to track and journal 3 to 4 days out of the week over the next 3 weeks.  I provided her with resources today and personally instruction on how to track using my net diary.  I recommend that she also use AI to find estimated calories on certain foods that may be hard to quantify.  If she is indeed staying within recommended calories and consuming the right amount of protein then she may need to focus on increasing volume of physical activity, this is usually the management of diet resistant obesity.

## 2022-10-15 ENCOUNTER — Telehealth: Payer: Self-pay | Admitting: *Deleted

## 2022-10-15 NOTE — Telephone Encounter (Signed)
Yesi,  This pt's BMI is greater than 50; their procedure will need to be performed at the hospital.  Thanks,    

## 2022-10-17 ENCOUNTER — Encounter (HOSPITAL_COMMUNITY): Payer: Self-pay | Admitting: Gastroenterology

## 2022-10-18 ENCOUNTER — Other Ambulatory Visit: Payer: Self-pay

## 2022-10-18 ENCOUNTER — Ambulatory Visit (AMBULATORY_SURGERY_CENTER): Payer: 59

## 2022-10-18 VITALS — Ht 65.0 in | Wt 301.0 lb

## 2022-10-18 DIAGNOSIS — Z86010 Personal history of colonic polyps: Secondary | ICD-10-CM

## 2022-10-18 MED ORDER — NA SULFATE-K SULFATE-MG SULF 17.5-3.13-1.6 GM/177ML PO SOLN
1.0000 | Freq: Once | ORAL | 0 refills | Status: AC
  Filled 2022-10-18: qty 354, 1d supply, fill #0

## 2022-10-18 NOTE — Progress Notes (Signed)
No egg or soy allergy known to patient  No issues known to pt with past sedation with any surgeries or procedures Patient denies ever being told they had issues or difficulty with intubation  No FH of Malignant Hyperthermia Pt is not on diet pills Pt is not on  home 02  Pt is not on blood thinners  Pt denies issues with chronic constipation  No A fib or A flutter Have any cardiac testing pending--no Ambulates independently Pt instructed to use Singlecare.com or GoodRx for a price reduction on prep

## 2022-10-23 ENCOUNTER — Other Ambulatory Visit: Payer: Self-pay

## 2022-10-25 ENCOUNTER — Ambulatory Visit (HOSPITAL_COMMUNITY): Payer: 59 | Admitting: Anesthesiology

## 2022-10-25 ENCOUNTER — Ambulatory Visit (HOSPITAL_BASED_OUTPATIENT_CLINIC_OR_DEPARTMENT_OTHER): Payer: 59 | Admitting: Anesthesiology

## 2022-10-25 ENCOUNTER — Other Ambulatory Visit: Payer: Self-pay

## 2022-10-25 ENCOUNTER — Ambulatory Visit (HOSPITAL_COMMUNITY)
Admission: RE | Admit: 2022-10-25 | Discharge: 2022-10-25 | Disposition: A | Discharge status: Home / Self Care | Payer: 59 | Attending: Gastroenterology | Admitting: Gastroenterology

## 2022-10-25 ENCOUNTER — Encounter (HOSPITAL_COMMUNITY): Payer: Self-pay | Admitting: Gastroenterology

## 2022-10-25 ENCOUNTER — Encounter (HOSPITAL_COMMUNITY)
Admission: RE | Disposition: A | Discharge status: Home / Self Care | Payer: Self-pay | Source: Home / Self Care | Attending: Gastroenterology

## 2022-10-25 DIAGNOSIS — Z86010 Personal history of colonic polyps: Secondary | ICD-10-CM | POA: Diagnosis not present

## 2022-10-25 DIAGNOSIS — Z87891 Personal history of nicotine dependence: Secondary | ICD-10-CM | POA: Insufficient documentation

## 2022-10-25 DIAGNOSIS — K644 Residual hemorrhoidal skin tags: Secondary | ICD-10-CM | POA: Insufficient documentation

## 2022-10-25 DIAGNOSIS — D122 Benign neoplasm of ascending colon: Secondary | ICD-10-CM | POA: Diagnosis not present

## 2022-10-25 DIAGNOSIS — K641 Second degree hemorrhoids: Secondary | ICD-10-CM | POA: Diagnosis not present

## 2022-10-25 DIAGNOSIS — K635 Polyp of colon: Secondary | ICD-10-CM

## 2022-10-25 DIAGNOSIS — Z09 Encounter for follow-up examination after completed treatment for conditions other than malignant neoplasm: Secondary | ICD-10-CM | POA: Diagnosis present

## 2022-10-25 DIAGNOSIS — M545 Low back pain, unspecified: Secondary | ICD-10-CM

## 2022-10-25 DIAGNOSIS — Z6841 Body Mass Index (BMI) 40.0 and over, adult: Secondary | ICD-10-CM | POA: Diagnosis not present

## 2022-10-25 HISTORY — PX: SUBMUCOSAL LIFTING INJECTION: SHX6855

## 2022-10-25 HISTORY — PX: HEMOSTASIS CLIP PLACEMENT: SHX6857

## 2022-10-25 HISTORY — PX: ENDOSCOPIC MUCOSAL RESECTION: SHX6839

## 2022-10-25 HISTORY — PX: HOT HEMOSTASIS: SHX5433

## 2022-10-25 HISTORY — PX: COLONOSCOPY WITH PROPOFOL: SHX5780

## 2022-10-25 HISTORY — PX: SUBMUCOSAL TATTOO INJECTION: SHX6856

## 2022-10-25 SURGERY — COLONOSCOPY WITH PROPOFOL
Anesthesia: Monitor Anesthesia Care

## 2022-10-25 MED ORDER — SPOT INK MARKER SYRINGE KIT
PACK | SUBMUCOSAL | Status: AC
  Filled 2022-10-25: qty 5

## 2022-10-25 MED ORDER — LIDOCAINE HCL 1 % IJ SOLN
INTRAMUSCULAR | Status: DC | PRN
  Administered 2022-10-25: 20 mg via INTRADERMAL
  Administered 2022-10-25: 50 mg via INTRADERMAL

## 2022-10-25 MED ORDER — NAPROXEN 500 MG PO TABS
500.0000 mg | ORAL_TABLET | Freq: Two times a day (BID) | ORAL | Status: DC
Start: 2022-11-01 — End: 2023-04-10

## 2022-10-25 MED ORDER — PROPOFOL 500 MG/50ML IV EMUL
INTRAVENOUS | Status: DC | PRN
  Administered 2022-10-25: 120 ug/kg/min via INTRAVENOUS

## 2022-10-25 MED ORDER — LACTATED RINGERS IV SOLN: INTRAVENOUS | Status: DC

## 2022-10-25 MED ORDER — SPOT INK MARKER SYRINGE KIT
PACK | SUBMUCOSAL | Status: DC | PRN
Start: 2022-10-25 — End: 2022-10-25
  Administered 2022-10-25: 1 mL via SUBMUCOSAL

## 2022-10-25 MED ORDER — PROPOFOL 500 MG/50ML IV EMUL
INTRAVENOUS | Status: AC
  Filled 2022-10-25: qty 50

## 2022-10-25 MED ORDER — PHENYLEPHRINE HCL (PRESSORS) 10 MG/ML IV SOLN
INTRAVENOUS | Status: DC | PRN
  Administered 2022-10-25: 40 ug via INTRAVENOUS
  Administered 2022-10-25: 100 ug via INTRAVENOUS
  Administered 2022-10-25: 120 ug via INTRAVENOUS
  Administered 2022-10-25: 160 ug via INTRAVENOUS
  Administered 2022-10-25 (×2): 100 ug via INTRAVENOUS
  Administered 2022-10-25: 160 ug via INTRAVENOUS
  Administered 2022-10-25: 80 ug via INTRAVENOUS
  Administered 2022-10-25: 40 ug via INTRAVENOUS

## 2022-10-25 MED ORDER — PROPOFOL 10 MG/ML IV BOLUS
INTRAVENOUS | Status: DC | PRN
Start: 2022-10-25 — End: 2022-10-25
  Administered 2022-10-25: 10 mg via INTRAVENOUS
  Administered 2022-10-25: 20 mg via INTRAVENOUS

## 2022-10-25 MED ORDER — PROPOFOL 1000 MG/100ML IV EMUL
INTRAVENOUS | Status: AC
  Filled 2022-10-25: qty 100

## 2022-10-25 MED ORDER — SODIUM CHLORIDE 0.9 % IV SOLN: INTRAVENOUS | Status: DC

## 2022-10-25 SURGICAL SUPPLY — 22 items

## 2022-10-25 NOTE — Anesthesia Preprocedure Evaluation (Addendum)
Anesthesia Evaluation  Patient identified by MRN, date of birth, ID band Patient awake    Reviewed: Allergy & Precautions, NPO status , Patient's Chart, lab work & pertinent test results  Airway Mallampati: II  TM Distance: >3 FB Neck ROM: Full    Dental  (+) Edentulous Upper, Missing   Pulmonary former smoker   Pulmonary exam normal        Cardiovascular negative cardio ROS Normal cardiovascular exam     Neuro/Psych  PSYCHIATRIC DISORDERS  Depression     Neuromuscular disease    GI/Hepatic negative GI ROS, Neg liver ROS,,,  Endo/Other    Morbid obesityPatient on GLP-1 Agonist!  Renal/GU Renal disease     Musculoskeletal  (+) Arthritis ,    Abdominal  (+) + obese  Peds  Hematology negative hematology ROS (+)   Anesthesia Other Findings colon polyp  Reproductive/Obstetrics                             Anesthesia Physical Anesthesia Plan  ASA: 4  Anesthesia Plan: MAC   Post-op Pain Management:    Induction: Intravenous  PONV Risk Score and Plan: 2 and Propofol infusion and Treatment may vary due to age or medical condition  Airway Management Planned: Simple Face Mask  Additional Equipment:   Intra-op Plan:   Post-operative Plan:   Informed Consent: I have reviewed the patients History and Physical, chart, labs and discussed the procedure including the risks, benefits and alternatives for the proposed anesthesia with the patient or authorized representative who has indicated his/her understanding and acceptance.     Dental advisory given  Plan Discussed with: CRNA  Anesthesia Plan Comments:        Anesthesia Quick Evaluation

## 2022-10-25 NOTE — Op Note (Signed)
[facility] Patient Name: Jenna [name] Procedure Date: 10/25/2022 MRN: [id number] Attending MD: Corliss Parish , MD, [id number] Date of Birth: 06/28/1970 CSN: [id number] Age: 52 Admit Type: Outpatient Procedure:                Colonoscopy Indications:              Excision of colonic polyp from ascending colon                            (adenoma removed in piecemeal/partially in 5/24) Providers:                Corliss Parish, MD, Doristine Mango, RN, Marja Kays, Technician Referring MD:             Dub Amis. Tomasa Rand, MD, Marcine Matar Medicines:                Monitored Anesthesia Care Complications:            No immediate complications. Estimated Blood Loss:     Estimated blood loss was minimal. Procedure:                Pre-Anesthesia Assessment:                           - Prior to the procedure, a History and Physical                            was performed, and patient medications and                            allergies were reviewed. The patient's tolerance of                            previous anesthesia was also reviewed. The risks                            and benefits of the procedure and the sedation                            options and risks were discussed with the patient.                            All questions were answered, and informed consent                            was obtained. Prior Anticoagulants: The patient has                            taken no anticoagulant or antiplatelet agents                            except for NSAID medication. ASA Grade Assessment:  III - A patient with severe systemic disease. After                            reviewing the risks and benefits, the patient was                            deemed in satisfactory condition to undergo the                            procedure.                           After obtaining informed consent, the  colonoscope                            was passed under direct vision. Throughout the                            procedure, the patient's blood pressure, pulse, and                            oxygen saturations were monitored continuously. The                            CF-HQ190L ([id number]) Olympus colonoscope was                            introduced through the anus and advanced to the 3                            cm into the ileum. The colonoscopy was performed                            without difficulty. The patient tolerated the                            procedure. The quality of the bowel preparation was                            adequate. The terminal ileum, ileocecal valve,                            appendiceal orifice, and rectum were photographed. Scope In: 9:31:47 AM Scope Out: 10:16:19 AM Scope Withdrawal Time: 0 hours 40 minutes 43 seconds  Total Procedure Duration: 0 hours 44 minutes 32 seconds  Findings:      The digital rectal exam findings include hemorrhoids. Pertinent       negatives include no palpable rectal lesions.      The terminal ileum and ileocecal valve appeared normal.      A medium post mucosectomy scar was found in the proximal ascending       colon. There was residual polypoid tissue present. Preparations were       made for attempt at repeat advanced resection. Demarcation of the lesion       was performed  with high-definition white light and narrow band imaging       to clearly identify the boundaries of the lesion. EverLift was injected       to raise the lesion, it only lifted partially as a result of the       fibrotic nature of the scar. Piecemeal mucosal resection using a snare       was performed. Resection was incomplete. The resected tissue was       retrieved. Fulguration to ablate the lesion by forcep avulsion was then       performed and felt to be successful. Fulguration to ablate the margin       and base of the resection site by argon  plasma was successful. To       prevent bleeding after mucosal resection, three hemostatic clips were       successfully placed (MR conditional). Clip manufacturer: [employer]. There was no bleeding at the end of the procedure. Area on       contralateral wall was tattooed with an injection of Spot (carbon black).      Normal mucosa was found in the entire colon otherwise.      Non-bleeding non-thrombosed external and internal hemorrhoids were found       during retroflexion, during perianal exam and during digital exam. The       hemorrhoids were Grade II (internal hemorrhoids that prolapse but reduce       spontaneously). Impression:               - Hemorrhoids found on digital rectal exam.                           - The examined portion of the ileum was normal.                           - Post mucosectomy scar in the proximal ascending                            colon. Evidence of residual/recurrent adenoma was                            present. Attempt at repeat EMR was partially                            successful. Then performed forcep avulsion. Then                            performed APC ablation to margin and base of                            resection site. Clips (MR conditional) were placed.                            Clip manufacturer: [employer]. Tattooed.                           - Normal mucosa in the entire examined colon  otherwise.                           - Non-bleeding non-thrombosed external and internal                            hemorrhoids. Moderate Sedation:      Not Applicable - Patient had care per Anesthesia. Recommendation:           - The patient will be observed post-procedure,                            until all discharge criteria are met.                           - Discharge patient to home.                           - Patient has a contact number available for                            emergencies.  The signs and symptoms of potential                            delayed complications were discussed with the                            patient. Return to normal activities tomorrow.                            Written discharge instructions were provided to the                            patient.                           - High fiber diet.                           - Use FiberCon 1-2 tablets PO daily.                           - Minimize NSAIDs for 1 to 2 weeks as able to                            decrease risk of post interventional bleeding.                           - Continue present medications.                           - Await pathology results.                           - Repeat colonoscopy in likely 6-9 months for  surveillance after piecemeal polypectomy. Will have                            Endorotor available and FTR be available if needed.                           - The findings and recommendations were discussed                            with the patient.                           - The findings and recommendations were discussed                            with the designated responsible adult. Procedure Code(s):        --- Professional ---                           [id number], Colonoscopy, flexible; with endoscopic                            mucosal resection Diagnosis Code(s):        --- Professional ---                           K64.1, Second degree hemorrhoids                           Z98.890, Other specified postprocedural states                           K63.5, Polyp of colon CPT copyright 2022 American Medical Association. All rights reserved. The codes documented in this report are preliminary and upon coder review may  be revised to meet current compliance requirements. Corliss Parish, MD 10/25/2022 10:36:25 AM Number of Addenda: 0

## 2022-10-25 NOTE — Discharge Instructions (Signed)

## 2022-10-25 NOTE — Anesthesia Postprocedure Evaluation (Signed)
Anesthesia Post Note  Patient: Jenna [name]  Procedure(s) Performed: COLONOSCOPY WITH PROPOFOL ENDOSCOPIC MUCOSAL RESECTION POLYPECTOMY SUBMUCOSAL LIFTING INJECTION HOT HEMOSTASIS (ARGON PLASMA COAGULATION/BICAP) HEMOSTASIS CLIP PLACEMENT SUBMUCOSAL TATTOO INJECTION     Patient location during evaluation: Endoscopy Anesthesia Type: MAC Level of consciousness: awake Pain management: pain level controlled Vital Signs Assessment: post-procedure vital signs reviewed and stable Respiratory status: spontaneous breathing, nonlabored ventilation and respiratory function stable Cardiovascular status: blood pressure returned to baseline and stable Postop Assessment: no apparent nausea or vomiting Anesthetic complications: no   No notable events documented.  Last Vitals:  Vitals:   10/25/22 1034 10/25/22 1043  BP: (!) 140/65 135/64  Pulse: 80 83  Resp: 14 (!) 21  Temp:    SpO2: 100% 100%    Last Pain:  Vitals:   10/25/22 1043  TempSrc:   PainSc: 0-No pain                  P 

## 2022-10-25 NOTE — H&P (Signed)
GASTROENTEROLOGY PROCEDURE H&P NOTE   Primary Care Physician: Marcine Matar, MD  HPI: Jenna [name] is a 52 y.o. 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  Past Medical History:  Diagnosis Date   Arthritis    Back pain    Back pain    Chest pain    Depression    Edema of both lower extremities    Joint pain    Left arm numbness    Numbness in both hands    Osteoarthritis    Prediabetes    SOB (shortness of breath)    Vitamin D deficiency    Past Surgical History:  Procedure Laterality Date   CHOLECYSTECTOMY     COLONOSCOPY WITH PROPOFOL N/A 07/19/2022   Procedure: COLONOSCOPY WITH PROPOFOL;  Surgeon: Jenel Lucks, MD;  Location: [facility] ENDOSCOPY;  Service: Gastroenterology;  Laterality: N/A;   POLYPECTOMY  07/19/2022   Procedure: POLYPECTOMY;  Surgeon: Jenel Lucks, MD;  Location: [facility] ENDOSCOPY;  Service: Gastroenterology;;   SUBMUCOSAL LIFTING INJECTION  07/19/2022   Procedure: SUBMUCOSAL LIFTING INJECTION;  Surgeon: Jenel Lucks, MD;  Location: [facility] ENDOSCOPY;  Service: Gastroenterology;;   TUBAL LIGATION     No current facility-administered medications for this encounter.   No current facility-administered medications for this encounter. Allergies  Allergen Reactions   Diflucan [Fluconazole] Hives   Family History  Problem Relation Age of Onset   Colon polyps Mother    Diabetes Mother    Hypertension Mother    Hypertension Father    Breast cancer Neg Hx    Colon cancer Neg Hx    Esophageal cancer Neg Hx    Rectal cancer Neg Hx    Stomach cancer Neg Hx    Social History   Socioeconomic History   Marital status: Single    Spouse name: Not on file   Number of children: Not on file   Years of education: Not on file   Highest education level: Not on file  Occupational History   Occupation: Stay at home, disabled  Tobacco Use    Smoking status: Former    Current packs/day: 0.00    Types: Cigarettes    Quit date: 10/10/2021    Years since quitting: 1.0   Smokeless tobacco: Never  Vaping Use   Vaping status: Never Used  Substance and Sexual Activity   Alcohol use: No    Alcohol/week: 0.0 standard drinks of alcohol   Drug use: No   Sexual activity: Not on file  Other Topics Concern   Not on file  Social History Narrative   Not on file   Social Determinants of Health   Financial Resource Strain: Low Risk  (10/01/2022)   Overall Financial Resource Strain (CARDIA)    Difficulty of Paying Living Expenses: Not hard at all  Food Insecurity: No Food Insecurity (10/01/2022)   Hunger Vital Sign    Worried About Running Out of Food in the Last Year: Never true    Ran Out of Food in the Last Year: Never true  Transportation Needs: No Transportation Needs (10/01/2022)   PRAPARE - Administrator, Civil Service (Medical): No    Lack of Transportation (Non-Medical): No  Physical Activity: Insufficiently Active (10/01/2022)   Exercise Vital Sign    Days of Exercise per Week: 2 days    Minutes of Exercise per Session: 10 min  Stress: No  Stress Concern Present (10/01/2022)   [employer] of Occupational Health - Occupational Stress Questionnaire    Feeling of Stress : Not at all  Social Connections: Moderately Isolated (10/01/2022)   Social Connection and Isolation Panel [NHANES]    Frequency of Communication with Friends and Family: More than three times a week    Frequency of Social Gatherings with Friends and Family: Twice a week    Attends Religious Services: 1 to 4 times per year    Active Member of [employer] or Organizations: No    Attends Banker Meetings: Never    Marital Status: Divorced  Catering manager Violence: Unknown (06/14/2021)   Received from [facility]   HITS    Physically Hurt: Not on file    Insult or Talk Down To: Not on file    Threaten Physical Harm: Not on file     Scream or Curse: Not on file    Physical Exam: There were no vitals filed for this visit. There is no height or weight on file to calculate BMI. GEN: NAD EYE: Sclerae anicteric ENT: MMM CV: Non-tachycardic GI: Soft, NT/ND NEURO:  Alert & Oriented x 3  Lab Results: No results for input(s): "WBC", "HGB", "HCT", "PLT" in the last 72 hours. BMET No results for input(s): "NA", "K", "CL", "CO2", "GLUCOSE", "BUN", "CREATININE", "CALCIUM" in the last 72 hours. LFT No results for input(s): "PROT", "ALBUMIN", "AST", "ALT", "ALKPHOS", "BILITOT", "BILIDIR", "IBILI" in the last 72 hours. PT/INR No results for input(s): "LABPROT", "INR" in the last 72 hours.   Impression / Plan: This is a 52 y.o.female who presents for Colonoscopy for attempt at further resection of a previous AC TA removed in piecemeal for advanced resection to prevent surgery.  Based upon the description and endoscopic pictures I do feel that it is reasonable to pursue an Advanced Polypectomy attempt of the polyp/lesion.  We discussed some of the techniques of advanced polypectomy which include Endoscopic Mucosal Resection, OVESCO Full-Thickness Resection, Endorotor Morcellation, and Tissue Ablation via Fulguration.  We also reviewed images of typical techniques as noted above.  The risks and benefits of endoscopic evaluation were discussed with the patient; these include but are not limited to the risk of perforation, infection, bleeding, missed lesions, lack of diagnosis, severe illness requiring hospitalization, as well as anesthesia and sedation related illnesses.  During attempts at advanced resection, the risks of bleeding and perforation/leak are increased as opposed to diagnostic and screening procedures, and that was discussed with the patient as well.   In addition, I explained that with the possible need for piecemeal resection, subsequent short-interval endoscopic evaluation for follow up and potential retreatment of the  lesion/area may be necessary.  I did offer, a referral to surgery in order for patient to have opportunity to discuss surgical management/intervention prior to finalizing decision for attempt at endoscopic removal, however, the patient deferred on this.  If, after attempt at removal of the polyp/lesion, it is found that the patient has a complication or that an invasive lesion or malignant lesion is found, or that the polyp/lesion continues to recur, the patient is aware and understands that surgery may still be indicated/required.  All patient questions were answered, to the best of my ability, and the patient agrees to the aforementioned plan of action with follow-up as indicated.   The risks and benefits of endoscopic evaluation/treatment were discussed with the patient and/or family; these include but are not limited to the risk of perforation, infection, bleeding, missed  lesions, lack of diagnosis, severe illness requiring hospitalization, as well as anesthesia and sedation related illnesses.  The patient's history has been reviewed, patient examined, no change in status, and deemed stable for procedure.  The patient and/or family is agreeable to proceed.    Corliss Parish, MD [facility] Advanced Endoscopy Office # [phone]

## 2022-10-25 NOTE — Transfer of Care (Signed)
Immediate Anesthesia Transfer of Care Note  Patient: Jenna [name]  Procedure(s) Performed: COLONOSCOPY WITH PROPOFOL ENDOSCOPIC MUCOSAL RESECTION POLYPECTOMY SUBMUCOSAL LIFTING INJECTION HOT HEMOSTASIS (ARGON PLASMA COAGULATION/BICAP) HEMOSTASIS CLIP PLACEMENT SUBMUCOSAL TATTOO INJECTION  Patient Location: PACU and Endoscopy Unit  Anesthesia Type:MAC  Level of Consciousness: awake, alert , oriented, and patient cooperative  Airway & Oxygen Therapy: Patient Spontanous Breathing and Patient connected to face mask oxygen  Post-op Assessment: Report given to RN and Post -op Vital signs reviewed and stable  Post vital signs: Reviewed and stable  Last Vitals:  Vitals Value Taken Time  BP 119/47 10/25/22 1024  Temp 36.2 C 10/25/22 1024  Pulse 82 10/25/22 1028  Resp 11 10/25/22 1028  SpO2 100 % 10/25/22 1028  Vitals shown include unfiled device data.  Last Pain:  Vitals:   10/25/22 1024  TempSrc: Temporal  PainSc: Asleep         Complications: No notable events documented.

## 2022-10-26 LAB — SURGICAL PATHOLOGY

## 2022-10-27 ENCOUNTER — Encounter: Payer: Self-pay | Admitting: Gastroenterology

## 2022-10-29 ENCOUNTER — Encounter (HOSPITAL_COMMUNITY): Payer: Self-pay | Admitting: Gastroenterology

## 2022-11-01 ENCOUNTER — Encounter: Payer: Self-pay | Admitting: Orthopaedic Surgery

## 2022-11-01 ENCOUNTER — Ambulatory Visit (INDEPENDENT_AMBULATORY_CARE_PROVIDER_SITE_OTHER): Payer: 59 | Admitting: Orthopaedic Surgery

## 2022-11-01 DIAGNOSIS — M67431 Ganglion, right wrist: Secondary | ICD-10-CM

## 2022-11-01 NOTE — Progress Notes (Signed)
The patient comes in today with recurrent right dorsal wrist ganglion cyst.  It has been quite sometime since we aspirated the cyst.  She would like to have it aspirated again today.  She denies any numbness and tingling or any issues as a relates to the cyst.  She denies any pain at all.  She has full range of motion of her right wrist.  There is a cystlike structure on the dorsal aspect of the wrist.  I was able to aspirate gelatinous material from the cyst on the dorsum of the right wrist after cleaning this with Betadine and alcohol.  This was consistent with a benign ganglion cyst.  I then placed a compressive dressing that she can remove in an hour or 2.  All question concerns were answered and addressed.  She understands this could recur and the definitive treatment would be surgery if it becomes a problem.

## 2022-11-08 ENCOUNTER — Other Ambulatory Visit (HOSPITAL_BASED_OUTPATIENT_CLINIC_OR_DEPARTMENT_OTHER): Payer: Self-pay

## 2022-11-08 ENCOUNTER — Other Ambulatory Visit: Payer: Self-pay | Admitting: Internal Medicine

## 2022-11-08 ENCOUNTER — Ambulatory Visit (INDEPENDENT_AMBULATORY_CARE_PROVIDER_SITE_OTHER): Payer: 59 | Admitting: Internal Medicine

## 2022-11-08 ENCOUNTER — Other Ambulatory Visit: Payer: Self-pay

## 2022-11-08 ENCOUNTER — Encounter (INDEPENDENT_AMBULATORY_CARE_PROVIDER_SITE_OTHER): Payer: Self-pay | Admitting: Internal Medicine

## 2022-11-08 VITALS — BP 124/80 | HR 96 | Temp 98.2°F | Ht 65.0 in | Wt 304.0 lb

## 2022-11-08 DIAGNOSIS — G4733 Obstructive sleep apnea (adult) (pediatric): Secondary | ICD-10-CM | POA: Diagnosis not present

## 2022-11-08 DIAGNOSIS — Z6841 Body Mass Index (BMI) 40.0 and over, adult: Secondary | ICD-10-CM

## 2022-11-08 DIAGNOSIS — R7303 Prediabetes: Secondary | ICD-10-CM

## 2022-11-08 DIAGNOSIS — G4761 Periodic limb movement disorder: Secondary | ICD-10-CM

## 2022-11-08 MED ORDER — ROPINIROLE HCL 0.5 MG PO TABS
0.5000 mg | ORAL_TABLET | Freq: Every day | ORAL | 1 refills | Status: DC
  Filled 2022-11-08: qty 30, 30d supply, fill #0
  Filled 2023-02-14 – 2023-03-18 (×2): qty 30, 30d supply, fill #1

## 2022-11-08 MED ORDER — OZEMPIC (0.25 OR 0.5 MG/DOSE) 2 MG/3ML ~~LOC~~ SOPN
0.5000 mg | PEN_INJECTOR | SUBCUTANEOUS | 0 refills | Status: DC
Start: 2022-11-08 — End: 2022-12-17
  Filled 2022-11-08: qty 3, 28d supply, fill #0

## 2022-11-08 NOTE — Progress Notes (Signed)
Office: [phone]  /  Fax: [phone]  WEIGHT SUMMARY AND BIOMETRICS  Vitals Temp: 98.2 F (36.8 C) BP: 124/80 Pulse Rate: 96 SpO2: 100 %   Anthropometric Measurements Height: 5\' 5"  (1.651 m) Weight: (!) 304 lb (137.9 kg) BMI (Calculated): 50.59 Weight at Last Visit: 306 lb Weight Lost Since Last Visit: 2 lb Weight Gained Since Last Visit: 0 lb Starting Weight: 321lb Total Weight Loss (lbs): 17 lb (7.711 kg) Peak Weight: 321 lb   Body Composition  Body Fat %: 53.2 % Fat Mass (lbs): 162.2 lbs Muscle Mass (lbs): 135.4 lbs Total Body Water (lbs): 99.6 lbs Visceral Fat Rating : 20    No data recorded Today's Visit #: 12  Starting Date: 03/14/22   HPI  Chief Complaint: OBESITY  Jenna [name] is here to discuss her progress with her obesity treatment plan. She is on the the Category 3 Plan and states she is following her eating plan approximately 97 % of the time. She states she is exercising 25-30 minutes 2-3 times per week.  Interval History:  Since last office visit she has lost 2 pounds..  She has been tracking and journaling as we had recommended.  On most days she is staying under 1000 cal, calorie target was 1500.  She is also not meeting her protein goals averaging about 40 to 50 g.  She notes significant reduction in appetite and increase satiety on semaglutide 0.5 mg once a day.  She inquires about phentermine.  She is not doing strengthening exercises. She reports fair adherence to reduced calorie nutritional plan. She has been working on reading food labels, not skipping meals, increasing protein intake at every meal, avoiding and / or reducing liquid calories, continues to exercise, eating out less, working on meal prepping, and reducing portion sizes  Orexigenic Control: Denies problems with appetite and hunger signals.  Denies problems with satiety and satiation.  Denies problems with eating patterns and portion control.  Denies abnormal  cravings. Denies feeling deprived or restricted.   Barriers identified:  Diet resistant obesity .   Pharmacotherapy for weight loss: She is currently taking  semaglutide 0.5 mg once a week for prediabetes. .    ASSESSMENT AND PLAN  TREATMENT PLAN FOR OBESITY:  Recommended Dietary Goals  Jenna [name] is currently in the action stage of change. As such, her goal is to continue weight management plan. She has agreed to: continue current plan and patient advised to increase calories and consumption of whole foods.  She also needs to increase her protein to about 30 to 40 g per meal.  This will help boost her metabolism.  Behavioral Intervention  We discussed the following Behavioral Modification Strategies today: increasing lean protein intake, decreasing simple carbohydrates , increasing vegetables, increasing lower glycemic fruits, increasing fiber rich foods, avoiding skipping meals, increasing water intake, and planning for success.  Additional resources provided today: None  Recommended Physical Activity Goals  Jenna [name] has been advised to work up to 150 minutes of moderate intensity aerobic activity a week and strengthening exercises 2-3 times per week for cardiovascular health, weight loss maintenance and preservation of muscle mass.   She has agreed to :  Think about ways to increase daily physical activity and overcoming barriers to exercise and we discussed joining fitness center.  I suspect she has diet resistant obesity and therefore benefits from progressive strengthening and physical activity.  This is likely better accomplished with a certified trainer.  Pharmacotherapy We discussed various medication options to help [facility]  with her weight loss efforts and we both agreed to :  We will decrease her semaglutide to 0.37 mg/week or 28 clicks using 0.5 mg pen  ASSOCIATED CONDITIONS ADDRESSED TODAY  Class 3 severe obesity without serious comorbidity with body mass index (BMI) of  50.0 to 59.9 in adult, unspecified obesity type [facility]) Assessment & Plan: Jenna [name] so far has lost 17 pounds her weight loss rate has been very gradual.  She has a normal metabolism has mild OSA and periodic limb movement disorder but she is not taking her ropinirole.  This does cause about 40 arousals at night based on my review of her sleep study from 2023.  This may be affecting her energy levels and can also take a toll on her metabolism and create a hormonal imbalance.  Physical activity levels are also low.  Review of her tracking and journaling shows that she is staying under calorie target most days but is not meeting protein goals.  She is also on incretin therapy which we are decreasing because of strong anorexogenic response affecting caloric and protein intake.  I highly recommend that she focus on increasing physical activity levels with focus on strength training.  She will look at some of the local gyms.   Prediabetes Assessment & Plan: She has had a progressive increase of hemoglobin A1c now at 6.3 with very high insulin levels.  She had been on metformin now twice a day for a few months but because of decrease in bicarbonate medication was discontinued as may increase the risk of metabolic acidosis when used with topiramate.   She is now on semaglutide 0.5 mg once a week, we will reduce medication to 0.37 mg because of strong anorexogenic effect.  Orders: -     Ozempic (0.25 or 0.5 MG/DOSE); Inject 0.5 mg into the skin once a week.  Dispense: 3 mL; Refill: 0  OSA (obstructive sleep apnea) Assessment & Plan: I was able to find sleep study, she has mild OSA but has periodic limb movement disorder with about 40 arousals per night.  This may affect sleep and cause an imbalance and hormones and may also have an effect on her metabolism.  I encouraged her to resume treatment with ropinirole which was prescribed by her PCP last year and to give it at least 4 weeks to see if this improves  her sleep.     PHYSICAL EXAM:  Blood pressure 124/80, pulse 96, temperature 98.2 F (36.8 C), height 5\' 5"  (1.651 m), weight (!) 304 lb (137.9 kg), last menstrual period 09/03/2022, SpO2 100%. Body mass index is 50.59 kg/m.  General: She is overweight, cooperative, alert, well developed, and in no acute distress. PSYCH: Has normal mood, affect and thought process.   HEENT: EOMI, sclerae are anicteric. Lungs: Normal breathing effort, no conversational dyspnea. Extremities: No edema.  Neurologic: No gross sensory or motor deficits. No tremors or fasciculations noted.    DIAGNOSTIC DATA REVIEWED:  BMET    Component Value Date/Time   NA 140 07/23/2022 1213   K 4.7 07/23/2022 1213   CL 108 (H) 07/23/2022 1213   CO2 19 (L) 07/23/2022 1213   GLUCOSE 105 (H) 07/23/2022 1213   GLUCOSE 118 (H) 12/20/2021 1430   BUN 8 07/23/2022 1213   CREATININE 0.89 07/23/2022 1213   CALCIUM 9.0 07/23/2022 1213   GFRNONAA 55 (L) 12/20/2021 1430   GFRAA >60 09/25/2019 1548   Lab Results  Component Value Date   HGBA1C 6.3 (H) 03/14/2022  HGBA1C 5.6 12/03/2017   Lab Results  Component Value Date   INSULIN 41.6 (H) 03/14/2022   INSULIN 7.4 12/08/2018   Lab Results  Component Value Date   TSH 1.655 12/20/2021   CBC    Component Value Date/Time   WBC 8.1 12/20/2021 1430   RBC 4.57 12/20/2021 1430   HGB 12.5 12/20/2021 1430   HGB 12.5 01/23/2021 1525   HCT 38.9 12/20/2021 1430   HCT 37.4 01/23/2021 1525   PLT 333 12/20/2021 1430   PLT 284 01/23/2021 1525   MCV 85.1 12/20/2021 1430   MCV 88 01/23/2021 1525   MCH 27.4 12/20/2021 1430   MCHC 32.1 12/20/2021 1430   RDW 14.6 12/20/2021 1430   RDW 15.1 01/23/2021 1525   Iron Studies    Component Value Date/Time   IRON 53 04/02/2019 1510   TIBC 360 04/02/2019 1510   FERRITIN 26 04/02/2019 1510   IRONPCTSAT 15 04/02/2019 1510   Lipid Panel     Component Value Date/Time   CHOL 140 03/14/2022 1030   TRIG 68 03/14/2022 1030    HDL 46 03/14/2022 1030   CHOLHDL 3.8 10/06/2020 1458   LDLCALC 80 03/14/2022 1030   Hepatic Function Panel     Component Value Date/Time   PROT 6.8 07/23/2022 1213   ALBUMIN 3.9 07/23/2022 1213   AST 24 07/23/2022 1213   ALT 28 07/23/2022 1213   ALKPHOS 81 07/23/2022 1213   BILITOT 0.4 07/23/2022 1213      Component Value Date/Time   TSH 1.655 12/20/2021 1430   TSH 1.430 01/23/2021 1525   Nutritional Lab Results  Component Value Date   VD25OH 28.3 (L) 07/23/2022   VD25OH 14.6 (L) 03/14/2022   VD25OH 14.8 (L) 01/23/2021     Return for Week of Sept 30th - 5 weeks - with Dr. Rikki Spearing.[name] She was informed of the importance of frequent follow up visits to maximize her success with intensive lifestyle modifications for her multiple health conditions.   ATTESTASTION STATEMENTS:  Reviewed by clinician on day of visit: allergies, medications, problem list, medical history, surgical history, family history, social history, and previous encounter notes.     Worthy Rancher, MD

## 2022-11-08 NOTE — Assessment & Plan Note (Signed)
She has had a progressive increase of hemoglobin A1c now at 6.3 with very high insulin levels.  She had been on metformin now twice a day for a few months but because of decrease in bicarbonate medication was discontinued as may increase the risk of metabolic acidosis when used with topiramate.   She is now on semaglutide 0.5 mg once a week, we will reduce medication to 0.37 mg because of strong anorexogenic effect.

## 2022-11-08 NOTE — Assessment & Plan Note (Signed)
I was able to find sleep study, she has mild OSA but has periodic limb movement disorder with about 40 arousals per night.  This may affect sleep and cause an imbalance and hormones and may also have an effect on her metabolism.  I encouraged her to resume treatment with ropinirole which was prescribed by her PCP last year and to give it at least 4 weeks to see if this improves her sleep.

## 2022-11-08 NOTE — Assessment & Plan Note (Signed)
Bronitta so far has lost 17 pounds her weight loss rate has been very gradual.  She has a normal metabolism has mild OSA and periodic limb movement disorder but she is not taking her ropinirole.  This does cause about 40 arousals at night based on my review of her sleep study from 2023.  This may be affecting her energy levels and can also take a toll on her metabolism and create a hormonal imbalance.  Physical activity levels are also low.  Review of her tracking and journaling shows that she is staying under calorie target most days but is not meeting protein goals.  She is also on incretin therapy which we are decreasing because of strong anorexogenic response affecting caloric and protein intake.  I highly recommend that she focus on increasing physical activity levels with focus on strength training.  She will look at some of the local gyms.

## 2022-11-09 ENCOUNTER — Other Ambulatory Visit (HOSPITAL_COMMUNITY): Payer: Self-pay

## 2022-11-09 ENCOUNTER — Other Ambulatory Visit (HOSPITAL_BASED_OUTPATIENT_CLINIC_OR_DEPARTMENT_OTHER): Payer: Self-pay

## 2022-12-17 ENCOUNTER — Ambulatory Visit: Payer: Self-pay

## 2022-12-17 ENCOUNTER — Encounter (INDEPENDENT_AMBULATORY_CARE_PROVIDER_SITE_OTHER): Payer: Self-pay | Admitting: Internal Medicine

## 2022-12-17 ENCOUNTER — Other Ambulatory Visit: Payer: Self-pay

## 2022-12-17 ENCOUNTER — Ambulatory Visit (INDEPENDENT_AMBULATORY_CARE_PROVIDER_SITE_OTHER): Payer: 59 | Admitting: Internal Medicine

## 2022-12-17 VITALS — BP 122/82 | HR 90 | Temp 98.1°F | Ht 65.0 in | Wt 314.0 lb

## 2022-12-17 DIAGNOSIS — G4733 Obstructive sleep apnea (adult) (pediatric): Secondary | ICD-10-CM

## 2022-12-17 DIAGNOSIS — E66813 Obesity, class 3: Secondary | ICD-10-CM | POA: Diagnosis not present

## 2022-12-17 DIAGNOSIS — Z6841 Body Mass Index (BMI) 40.0 and over, adult: Secondary | ICD-10-CM

## 2022-12-17 DIAGNOSIS — R7303 Prediabetes: Secondary | ICD-10-CM

## 2022-12-17 MED ORDER — ONDANSETRON HCL 4 MG PO TABS
4.0000 mg | ORAL_TABLET | Freq: Three times a day (TID) | ORAL | 0 refills | Status: DC | PRN
  Filled 2022-12-17: qty 30, 10d supply, fill #0

## 2022-12-17 MED ORDER — SEMAGLUTIDE (1 MG/DOSE) 4 MG/3ML ~~LOC~~ SOPN
1.0000 mg | PEN_INJECTOR | SUBCUTANEOUS | 0 refills | Status: DC
Start: 2022-12-17 — End: 2023-01-16
  Filled 2022-12-17: qty 3, 28d supply, fill #0

## 2022-12-17 NOTE — Assessment & Plan Note (Signed)
 See obesity treatment plan

## 2022-12-17 NOTE — Telephone Encounter (Signed)
Chief Complaint: Vaginal itching Symptoms: vaginal itching, foul odor, yellow discharge, cloudy urine Frequency: Constant, onset 2 weeks ago Pertinent Negatives: Patient denies pain, blood in urine, fever Disposition: [] ED /[x] Urgent Care (no appt availability in office) / [] Appointment(In office/virtual)/ []  [place] [facility]/ [] Home Care/ [] Refused Recommended Disposition /[] [place] [facility]/ []  Follow-up with PCP Additional Notes: Patient states she began having vaginal discharge about 2 weeks ago. She recently started to experience a foul odor, vaginal itching, cloudy urine. Patient stated that her symptoms have not gotten any better since her menstrual cycle ended about 2 weeks ago. Care advice given and no appointments are available in office. Patient has been scheduled at urgent care tomorrow at 1000. Advised patient to callback if symptoms get worse. Patient verbalized understanding.  Summary: possible yeast   Patient called stated she is experiencing vaginal itching, unusual an odor, yellowish discharge and pee is cloudy. Please f/u with patient.     Reason for Disposition  MODERATE-SEVERE itching (i.e., interferes with school, work, or sleep)  Answer Assessment - Initial Assessment Questions 1. SYMPTOM: "What's the main symptom you're concerned about?" (e.g., pain, itching, dryness)     Itching 2. LOCATION: "Where is the  itching located?" (e.g., inside/outside, left/right)     In the inside  3. ONSET: "When did the  itching  start?"     2 weeks ago 4. PAIN: "Is there any pain?" If Yes, ask: "How bad is it?" (Scale: 1-10; mild, moderate, severe)   -  MILD (1-3): Doesn't interfere with normal activities.    -  MODERATE (4-7): Interferes with normal activities (e.g., work or school) or awakens from sleep.     -  SEVERE (8-10): Excruciating pain, unable to do any normal activities.     No 5. ITCHING: "Is there any itching?" If Yes, ask: "How bad is it?" (Scale: 1-10;  mild, moderate, severe)     5/10 mild 6. CAUSE: "What do you think is causing the discharge?" "Have you had the same problem before? What happened then?"     Maybe a yeast infection  7. OTHER SYMPTOMS: "Do you have any other symptoms?" (e.g., fever, itching, vaginal bleeding, pain with urination, injury to genital area, vaginal foreign body)     Itching, odor, yellowish discharge  Protocols used: Vaginal Symptoms-A-AH

## 2022-12-17 NOTE — Telephone Encounter (Signed)
Call placed to patient unable to reach message left on VM.   

## 2022-12-17 NOTE — Progress Notes (Signed)
[address] [place], [place], [place] [address] Office: [phone]  /  Fax: [phone]   WEIGHT SUMMARY AND BIOMETRICS  Vitals Temp: 98.1 F (36.7 C) BP: 122/82 Pulse Rate: 90 SpO2: 100 %   Anthropometric Measurements Height: 5\' 5"  (1.651 m) Weight: (!) 314 lb (142.4 kg) BMI (Calculated): 52.25 Weight at Last Visit: 304 lb Weight Lost Since Last Visit: 0 lb Weight Gained Since Last Visit: 10 lb Starting Weight: 321 l Total Weight Loss (lbs): 7 lb (3.175 kg) Peak Weight: 321 lb   Body Composition  Body Fat %: 55.9 % Fat Mass (lbs): 175.8 lbs Muscle Mass (lbs): 131.8 lbs Visceral Fat Rating : 21     No data recorded Today's Visit #: 12   Starting Date: 03/14/22    Chief Complaint: OBESITY  HPI  Discussed the use of AI scribe software for clinical note transcription with the patient, who gave verbal consent to proceed.  History of Present Illness    Patient presents for a follow-up visit on obesity. She reports adherence to the category three meal plan approximately 90% of the time and engages in 15-20 minutes of walking seven times per week. Despite these efforts, the patient has gained 10 pounds since the last visit.  The patient is also on semaglutide 0.5 mg once a week for weight management. She reports an increase in appetite and uncontrollable hunger after a recent reduction in the dose of semaglutide, which led her to self-adjust the dose back to 0.5 mg. The patient prefers to have a reduced appetite and sometimes has to remind herself to eat.  The patient has experienced nausea associated with semaglutide, which she has managed by eating before taking the medication. The nausea lasts for about 30-45 minutes.   The patient also reports a decrease in physical activity levels and water intake, which she believes may have contributed to her weight gain. She expresses frustration with the weight gain and a desire to increase the dose of semaglutide to  control her appetite better.  The patient also mentions a history of restless leg syndrome, but it is unclear if this is currently being treated or if it is contributing to her current weight management issues.  The patient expresses a willingness to try new dietary strategies, including a 7-day high-protein plan and meal replacements. She also expresses interest in increasing her physical activity levels but reports pain in her legs and back that increases with exercise. She is considering trying aqua aerobics classes as a low-impact exercise option.       Barriers identified: low volume of physical acitivity, medical comorbidities, difficulty implementing reduced calorie nutrition plan, and presence of obesogenic drugs.   Pharmacotherapy for weight loss: She is currently taking Ozempic with diabetes as the primary indication with adequate clinical response  and experiencing the following side effects: Mild nausea on the day of injection..    ASSESSMENT AND PLAN  TREATMENT PLAN FOR OBESITY:  Recommended Dietary Goals  Jenna [name] is currently in the action stage of change. As such, her goal is to continue weight management plan. She has agreed to: She was provided with a 7-day, AI generated, 1300-calorie high-protein meal plan.  Recommend she keep a food journal with a target of  1300 cal calories and 30-40 grams of protein per meal and incorporate 1-2 meal replacements a day for convenience   Behavioral Intervention  We discussed the following Behavioral Modification Strategies today: continue to work on maintaining a reduced calorie state, getting the recommended  amount of protein, incorporating whole foods, making healthy choices, staying well hydrated and practicing mindfulness when eating..  Additional resources provided today: None  Recommended Physical Activity Goals  Jenna [name] has been advised to work up to 150 minutes of moderate intensity aerobic activity a week and strengthening  exercises 2-3 times per week for cardiovascular health, weight loss maintenance and preservation of muscle mass.   She has agreed to :  Think about enjoyable ways to increase daily physical activity and overcoming barriers to exercise and Increase physical activity in their day and reduce sedentary time (increase NEAT).  She is agreeable to exploring dropping in at the aquatic center to do aqua aerobics.  I also recommend chair exercises as she has problems with poor endurance and knee pain in the upright position.  Pharmacotherapy We discussed various medication options to help Jenna [name] with her weight loss efforts and we both agreed to : continue with nutritional and behavioral strategies  ASSOCIATED CONDITIONS ADDRESSED TODAY  Class 3 severe obesity without serious comorbidity with body mass index (BMI) of 50.0 to 59.9 in adult, unspecified obesity type [facility]) Assessment & Plan: See obesity treatment plan  Orders: -     Ondansetron HCl; Take 1 tablet (4 mg total) by mouth every 8 (eight) hours as needed for nausea or vomiting.  Dispense: 30 tablet; Refill: 0 -     Semaglutide (1 MG/DOSE); Inject 1 mg as directed once a week.  Dispense: 3 mL; Refill: 0  Prediabetes Assessment & Plan: She has had a progressive increase of hemoglobin A1c now at 6.3 with very high insulin levels.  She had been on metformin in the past but medication was discontinued because of increase in bicarbonate while also taking topiramate.  She is now on semaglutide 0.5 mg once a week which we will increase to 1 mg once a week for diabetes prevention and better orixegenic control.  Orders: -     Ondansetron HCl; Take 1 tablet (4 mg total) by mouth every 8 (eight) hours as needed for nausea or vomiting.  Dispense: 30 tablet; Refill: 0 -     Semaglutide (1 MG/DOSE); Inject 1 mg as directed once a week.  Dispense: 3 mL; Refill: 0  OSA (obstructive sleep apnea) Assessment & Plan: I was able to find sleep study, she has  mild OSA but has periodic limb movement disorder with about 40 arousals per night.  This may affect sleep and cause an imbalance and hormones and may also have an effect on her metabolism.    She is now taking ropinirole and is concerned about the medication causing weight gain.  Orders: -     Semaglutide (1 MG/DOSE); Inject 1 mg as directed once a week.  Dispense: 3 mL; Refill: 0    PHYSICAL EXAM:  Blood pressure 122/82, pulse 90, temperature 98.1 F (36.7 C), height 5\' 5"  (1.651 m), weight (!) 314 lb (142.4 kg), last menstrual period 12/10/2022, SpO2 100%. Body mass index is 52.25 kg/m.  General: She is overweight, cooperative, alert, well developed, and in no acute distress. PSYCH: Has normal mood, affect and thought process.   HEENT: EOMI, sclerae are anicteric. Lungs: Normal breathing effort, no conversational dyspnea. Extremities: No edema.  Neurologic: No gross sensory or motor deficits. No tremors or fasciculations noted.    DIAGNOSTIC DATA REVIEWED:  BMET    Component Value Date/Time   NA 140 07/23/2022 1213   K 4.7 07/23/2022 1213   CL 108 (H) 07/23/2022 1213   CO2  19 (L) 07/23/2022 1213   GLUCOSE 105 (H) 07/23/2022 1213   GLUCOSE 118 (H) 12/20/2021 1430   BUN 8 07/23/2022 1213   CREATININE 0.89 07/23/2022 1213   CALCIUM 9.0 07/23/2022 1213   GFRNONAA 55 (L) 12/20/2021 1430   GFRAA >60 09/25/2019 1548   Lab Results  Component Value Date   HGBA1C 6.3 (H) 03/14/2022   HGBA1C 5.8 05/23/2021   HGBA1C 6.0 (H) 10/06/2020   HGBA1C 5.5 07/28/2019   HGBA1C 5.4 12/08/2018   Lab Results  Component Value Date   INSULIN 41.6 (H) 03/14/2022   INSULIN 16.1 07/28/2019   INSULIN 7.4 12/08/2018   Lab Results  Component Value Date   TSH 1.655 12/20/2021   CBC    Component Value Date/Time   WBC 8.1 12/20/2021 1430   RBC 4.57 12/20/2021 1430   HGB 12.5 12/20/2021 1430   HGB 12.5 01/23/2021 1525   HCT 38.9 12/20/2021 1430   HCT 37.4 01/23/2021 1525   PLT 333  12/20/2021 1430   PLT 284 01/23/2021 1525   MCV 85.1 12/20/2021 1430   MCV 88 01/23/2021 1525   MCH 27.4 12/20/2021 1430   MCHC 32.1 12/20/2021 1430   RDW 14.6 12/20/2021 1430   RDW 15.1 01/23/2021 1525   Iron Studies    Component Value Date/Time   IRON 53 04/02/2019 1510   TIBC 360 04/02/2019 1510   FERRITIN 26 04/02/2019 1510   IRONPCTSAT 15 04/02/2019 1510   Lipid Panel     Component Value Date/Time   CHOL 140 03/14/2022 1030   TRIG 68 03/14/2022 1030   HDL 46 03/14/2022 1030   CHOLHDL 3.8 10/06/2020 1458   LDLCALC 80 03/14/2022 1030   Lab Results  Component Value Date   CHOL 140 03/14/2022   HDL 46 03/14/2022   LDLCALC 80 03/14/2022   TRIG 68 03/14/2022   Hepatic Function Panel     Component Value Date/Time   PROT 6.8 07/23/2022 1213   ALBUMIN 3.9 07/23/2022 1213   AST 24 07/23/2022 1213   ALT 28 07/23/2022 1213   ALKPHOS 81 07/23/2022 1213   BILITOT 0.4 07/23/2022 1213      Component Value Date/Time   TSH 1.655 12/20/2021 1430   TSH 1.430 01/23/2021 1525   Nutritional Lab Results  Component Value Date   VD25OH 28.3 (L) 07/23/2022   VD25OH 14.6 (L) 03/14/2022   VD25OH 14.8 (L) 01/23/2021     Return in about 4 weeks (around 01/14/2023) for For Weight Mangement with Dr. Rikki Spearing.[name] She was informed of the importance of frequent follow up visits to maximize her success with intensive lifestyle modifications for her multiple health conditions.   ATTESTASTION STATEMENTS:  Reviewed by clinician on day of visit: allergies, medications, problem list, medical history, surgical history, family history, social history, and previous encounter notes.   I have spent 40 minutes in the care of the patient today including: preparing to see patient (e.g. review and interpretation of tests, old notes ), obtaining and/or reviewing separately obtained history, performing a medically appropriate examination or evaluation, counseling and educating the patient, documenting  clinical information in the electronic or other health care record, and independently interpreting results and communicating results to the patient, family, or caregiver   Worthy Rancher, MD

## 2022-12-17 NOTE — Assessment & Plan Note (Signed)
I was able to find sleep study, she has mild OSA but has periodic limb movement disorder with about 40 arousals per night.  This may affect sleep and cause an imbalance and hormones and may also have an effect on her metabolism.    She is now taking ropinirole and is concerned about the medication causing weight gain.

## 2022-12-17 NOTE — Assessment & Plan Note (Addendum)
She has had a progressive increase of hemoglobin A1c now at 6.3 with very high insulin levels.  She had been on metformin in the past but medication was discontinued because of increase in bicarbonate while also taking topiramate.  She is now on semaglutide 0.5 mg once a week which we will increase to 1 mg once a week for diabetes prevention and better orixegenic control.

## 2022-12-18 ENCOUNTER — Encounter (HOSPITAL_COMMUNITY): Payer: Self-pay

## 2022-12-18 ENCOUNTER — Ambulatory Visit (HOSPITAL_COMMUNITY): Payer: Self-pay

## 2022-12-18 ENCOUNTER — Ambulatory Visit (HOSPITAL_COMMUNITY)
Admission: RE | Admit: 2022-12-18 | Discharge: 2022-12-18 | Disposition: A | Discharge status: Home / Self Care | Payer: 59 | Source: Ambulatory Visit | Attending: Family Medicine | Admitting: Family Medicine

## 2022-12-18 ENCOUNTER — Other Ambulatory Visit: Payer: Self-pay

## 2022-12-18 ENCOUNTER — Telehealth (HOSPITAL_COMMUNITY): Payer: Self-pay | Admitting: Family Medicine

## 2022-12-18 VITALS — BP 131/69 | HR 94 | Temp 98.3°F | Resp 16 | Ht 65.0 in | Wt 314.0 lb

## 2022-12-18 DIAGNOSIS — N760 Acute vaginitis: Secondary | ICD-10-CM

## 2022-12-18 NOTE — ED Triage Notes (Signed)
Patient here today with c/o vaginal discharge, itching, odor, and cloudy urine for over a week.

## 2022-12-18 NOTE — Telephone Encounter (Signed)
Cytology swab ordered and now; should have been ordered in visit.

## 2022-12-18 NOTE — ED Provider Notes (Addendum)
[facility]    CSN: [id number] Arrival date & time: 12/18/22  1014      History   Chief Complaint Chief Complaint  Patient presents with   Vaginal Discharge    HPI Jenna [name] is a 52 y.o. female.    Vaginal Discharge Here for vaginal discharge and itching and a little bit of smell.  Symptoms have been going on for about 3 days.  She has had a little bit of cloudiness in her urine, but no urinary frequency  No fever or chills no nausea vomiting.  She has had a little bit of abdominal cramping, but she thinks that is due to her Ozempic.  She has prediabetes.   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   Acute kidney injury (HCC) 03/28/2022   Prediabetes 03/28/2022   Other spondylosis with radiculopathy, cervical region 10/03/2021   Fecal occult blood test positive 07/15/2021   Periodic limb movement 07/11/2021   Loud snoring 01/23/2021   Severe obesity (BMI >= 40) (HCC) 02/08/2020   Insulin resistance 07/28/2019   Depression 07/28/2019   Vitamin D deficiency 12/09/2018   Class 3 severe obesity without serious comorbidity with body mass index (BMI) of 50.0 to 59.9 in adult (HCC) 01/17/2017   Low back pain 05/17/2016   Unilateral primary osteoarthritis, left knee 05/08/2016   Primary osteoarthritis of both knees 06/27/2015   Closed right fibular fracture 08/10/2014    Past Surgical History:  Procedure Laterality Date   CHOLECYSTECTOMY     COLONOSCOPY WITH PROPOFOL  N/A 07/19/2022   Procedure: COLONOSCOPY WITH PROPOFOL;  Surgeon: Jenel Lucks, MD;  Location: [facility] ENDOSCOPY;  Service: Gastroenterology;  Laterality: N/A;   COLONOSCOPY WITH PROPOFOL N/A 10/25/2022   Procedure: COLONOSCOPY WITH PROPOFOL;  Surgeon: Meridee Score Netty Starring., MD;  Location: [facility] ENDOSCOPY;  Service: Gastroenterology;  Laterality: N/A;   ENDOSCOPIC MUCOSAL RESECTION N/A 10/25/2022   Procedure: ENDOSCOPIC MUCOSAL RESECTION;  Surgeon: Meridee Score Netty Starring., MD;  Location: [facility] ENDOSCOPY;  Service: Gastroenterology;  Laterality: N/A;   HEMOSTASIS CLIP PLACEMENT  10/25/2022   Procedure: HEMOSTASIS CLIP PLACEMENT;  Surgeon: Lemar Lofty., MD;  Location: [facility] ENDOSCOPY;  Service: Gastroenterology;;   HOT HEMOSTASIS N/A 10/25/2022   Procedure: HOT HEMOSTASIS (ARGON PLASMA COAGULATION/BICAP);  Surgeon: Lemar Lofty., MD;  Location: [facility] ENDOSCOPY;  Service: Gastroenterology;  Laterality: N/A;   POLYPECTOMY  07/19/2022   Procedure: POLYPECTOMY;  Surgeon: Jenel Lucks, MD;  Location: [facility] ENDOSCOPY;  Service: Gastroenterology;;   SUBMUCOSAL LIFTING INJECTION  07/19/2022   Procedure: SUBMUCOSAL LIFTING INJECTION;  Surgeon: Jenel Lucks, MD;  Location: [facility] ENDOSCOPY;  Service: Gastroenterology;;   SUBMUCOSAL LIFTING INJECTION  10/25/2022   Procedure: SUBMUCOSAL LIFTING INJECTION;  Surgeon: Lemar Lofty., MD;  Location: [facility] ENDOSCOPY;  Service: Gastroenterology;;   SUBMUCOSAL TATTOO INJECTION  10/25/2022   Procedure:  SUBMUCOSAL TATTOO INJECTION;  Surgeon: Meridee Score, Netty Starring., MD;  Location: [facility] ENDOSCOPY;  Service: Gastroenterology;;   TUBAL LIGATION      OB History     Gravida  6   Para  6   Term      Preterm      AB      Living         SAB      IAB      Ectopic      Multiple      Live Births               Home Medications    Prior to Admission medications   Medication Sig Start Date End Date Taking? Authorizing Provider   DULoxetine (CYMBALTA) 60 MG capsule Take 1 capsule (60 mg total) by mouth daily for back pain and mood. 09/19/22   Marcine Matar, MD  ferrous sulfate 325 (65 FE) MG tablet Take 325 mg by mouth every Wednesday.    [provider]  furosemide (LASIX) 20 MG tablet Take 1 tablet (20 mg total) by mouth daily. Patient taking differently: Take 20 mg by mouth as needed for edema or fluid. 07/23/22   Worthy Rancher, MD  gabapentin (NEURONTIN) 300 MG capsule Take 1 capsule (300 mg total) by mouth 3 (three) times daily as needed. Patient taking differently: Take 300 mg by mouth daily as needed. 02/13/22   Kathryne Hitch, MD  hydrOXYzine (ATARAX) 25 MG tablet TAKE 1 TO 2 TABLETS BY MOUTH EVERY 6 TO 8 HOURS AS NEEDED FOR ITCHING 01/18/22   Marcine Matar, MD  naproxen (NAPROSYN) 500 MG tablet Take 1 tablet (500 mg total) by mouth 2 (two) times daily with a meal as needed for pain. 11/01/22   Mansouraty, Netty Starring., MD  ondansetron (ZOFRAN) 4 MG tablet Take 1 tablet (4 mg total) by mouth every 8 (eight) hours as needed for nausea or vomiting. 12/17/22   Worthy Rancher, MD  rOPINIRole (REQUIP) 0.5 MG tablet Take 1 tablet (0.5 mg total) by mouth at bedtime. 11/08/22   Marcine Matar, MD  Semaglutide, 1 MG/DOSE, 4 MG/3ML SOPN Inject 1 mg as directed once a week. 12/17/22   Worthy Rancher, MD  tiZANidine (ZANAFLEX) 4 MG tablet Take 1 tablet (4 mg total) by mouth every 8 (eight) hours as needed for muscle spasms. 11/20/21   Kathryne Hitch, MD  topiramate (TOPAMAX) 25 MG tablet Take 25 mg (1 pill) at bedtime for one week, then increase to 50 mg (2 pills) at bedtime for one week, then take 75 mg (3 pills) at bedtime for one week, then take 100 mg (4 pills) at bedtime and continue on this dose Patient taking differently: Take 75 mg by mouth at bedtime. 09/25/21   Ocie Doyne, MD  Vitamin D, Ergocalciferol, (DRISDOL) 1.25 MG (50000 UNIT) CAPS capsule Take 1 capsule (50,000  Units total) by mouth every 7 (seven) days. 10/01/22   Marcine Matar, MD    Family History Family History  Problem Relation Age of Onset   Colon polyps Mother    Diabetes Mother    Hypertension Mother    Hypertension Father    Breast cancer Neg Hx    Colon cancer Neg Hx    Esophageal cancer Neg Hx    Rectal cancer Neg Hx    Stomach cancer Neg Hx     Social History Social History   Tobacco Use   Smoking status: Former  Current packs/day: 0.00    Types: Cigarettes    Quit date: 10/10/2021    Years since quitting: 1.1   Smokeless tobacco: Never  Vaping Use   Vaping status: Never Used  Substance Use Topics   Alcohol use: No    Alcohol/week: 0.0 standard drinks of alcohol   Drug use: No     Allergies   Diflucan [fluconazole]   Review of Systems Review of Systems  Genitourinary:  Positive for vaginal discharge.     Physical Exam Triage Vital Signs ED Triage Vitals  Encounter Vitals Group     BP 12/18/22 1027 131/69     Systolic BP Percentile --      Diastolic BP Percentile --      Pulse Rate 12/18/22 1027 94     Resp 12/18/22 1027 16     Temp 12/18/22 1027 98.3 F (36.8 C)     Temp Source 12/18/22 1027 Oral     SpO2 12/18/22 1027 97 %     Weight 12/18/22 1026 (!) 314 lb (142.4 kg)     Height 12/18/22 1026 5\' 5"  (1.651 m)     Head Circumference --      Peak Flow --      Pain Score 12/18/22 1026 0     Pain Loc --      Pain Education --      Exclude from Growth Chart --    No data found.  Updated Vital Signs BP 131/69 (BP Location: Right Arm)   Pulse 94   Temp 98.3 F (36.8 C) (Oral)   Resp 16   Ht 5\' 5"  (1.651 m)   Wt (!) 142.4 kg   LMP 12/10/2022 (Approximate)   SpO2 97%   BMI 52.25 kg/m   Visual Acuity Right Eye Distance:   Left Eye Distance:   Bilateral Distance:    Right Eye Near:   Left Eye Near:    Bilateral Near:     Physical Exam Vitals reviewed.  Constitutional:      General: She is not in acute distress.     Appearance: She is not ill-appearing, toxic-appearing or diaphoretic.  Cardiovascular:     Rate and Rhythm: Normal rate and regular rhythm.  Pulmonary:     Effort: Pulmonary effort is normal.     Breath sounds: Normal breath sounds.  Abdominal:     Palpations: Abdomen is soft.     Tenderness: There is no abdominal tenderness.  Musculoskeletal:     Cervical back: Neck supple.  Lymphadenopathy:     Cervical: No cervical adenopathy.  Skin:    Coloration: Skin is not jaundiced or pale.  Neurological:     General: No focal deficit present.     Mental Status: She is alert.  Psychiatric:        Behavior: Behavior normal.      UC Treatments / Results  Labs (all labs ordered are listed, but only abnormal results are displayed) Labs Reviewed - No data to display  EKG   Radiology No results found.  Procedures Procedures (including critical care time)  Medications Ordered in UC Medications - No data to display  Initial Impression / Assessment and Plan / UC Course  I have reviewed the triage vital signs and the nursing notes.  Pertinent labs & imaging results that were available during my care of the patient were reviewed by me and considered in my medical decision making (see chart for details).      We decided  to wait on empiric treatment and await the results of her swab.  She has had rash with fluconazole.  It may be best to try treating a yeast infection with nystatin oral solution administered vaginally or with boric acid or with Brexafemme.  Staff will notify her of any positives on the swab and treat per protocol. Final Clinical Impressions(s) / UC Diagnoses   Final diagnoses:  Acute vaginitis     Discharge Instructions      Staff will notify you if there is anything positive on the swab.       ED Prescriptions   None    PDMP not reviewed this encounter.   Zenia Resides, MD 12/18/22 1057    Zenia Resides, MD 12/18/22 1058

## 2022-12-18 NOTE — Discharge Instructions (Signed)
Staff will notify you if there is anything positive on the swab

## 2022-12-19 LAB — CERVICOVAGINAL ANCILLARY ONLY
Bacterial Vaginitis (gardnerella): POSITIVE — AB
Candida Glabrata: NEGATIVE
Candida Vaginitis: NEGATIVE
Chlamydia: NEGATIVE
Comment: NEGATIVE
Comment: NEGATIVE
Comment: NEGATIVE
Comment: NEGATIVE
Comment: NEGATIVE
Comment: NORMAL
Neisseria Gonorrhea: NEGATIVE
Trichomonas: POSITIVE — AB

## 2022-12-21 ENCOUNTER — Telehealth: Payer: Self-pay | Admitting: Emergency Medicine

## 2022-12-21 MED ORDER — METRONIDAZOLE 500 MG PO TABS: 500.0000 mg | ORAL_TABLET | Freq: Two times a day (BID) | ORAL | 0 refills | Status: DC

## 2022-12-21 NOTE — Telephone Encounter (Signed)
Metronidazole for positive BV and Trichomonas

## 2022-12-23 ENCOUNTER — Other Ambulatory Visit: Payer: Self-pay | Admitting: Internal Medicine

## 2022-12-23 ENCOUNTER — Other Ambulatory Visit: Payer: Self-pay

## 2022-12-23 ENCOUNTER — Other Ambulatory Visit: Payer: Self-pay | Admitting: Orthopaedic Surgery

## 2022-12-23 DIAGNOSIS — F32A Depression, unspecified: Secondary | ICD-10-CM

## 2022-12-23 DIAGNOSIS — M545 Low back pain, unspecified: Secondary | ICD-10-CM

## 2022-12-24 ENCOUNTER — Other Ambulatory Visit: Payer: Self-pay

## 2022-12-24 MED ORDER — DULOXETINE HCL 60 MG PO CPEP
60.0000 mg | ORAL_CAPSULE | Freq: Every day | ORAL | 0 refills | Status: DC
Start: 2022-12-24 — End: 2023-02-14
  Filled 2022-12-24: qty 30, 30d supply, fill #0

## 2022-12-24 MED ORDER — TIZANIDINE HCL 4 MG PO TABS
4.0000 mg | ORAL_TABLET | Freq: Three times a day (TID) | ORAL | 0 refills | Status: DC | PRN
  Filled 2022-12-24: qty 60, 20d supply, fill #0

## 2022-12-24 NOTE — Telephone Encounter (Signed)
Requested medication (s) are due for refill today: yes   Requested medication (s) are on the active medication list: yes   Last refill:  09/19/22 #30 0 refills   Future visit scheduled: yes in 4 days  Notes to clinic:   do you want to give another courtesy refill?     Requested Prescriptions  Pending Prescriptions Disp Refills   DULoxetine (CYMBALTA) 60 MG capsule 30 capsule 0    Sig: Take 1 capsule (60 mg total) by mouth daily for back pain and mood.     Psychiatry: Antidepressants - SNRI - duloxetine Failed - 12/23/2022  6:26 PM      Failed - Valid encounter within last 6 months    Recent Outpatient Visits           2 months ago Encounter for Medicare annual wellness exam   [place] [name] Marcine Matar, MD   11 months ago Morbid obesity [facility])   [place] [facility] Marcine Matar, MD   1 year ago Cervical radiculopathy   [place] [facility] Marcine Matar, MD   1 year ago Pap smear for cervical cancer screening   [place] [facility] Marcine Matar, MD   1 year ago Morbid obesity [facility])   [place] [facility] Marcine Matar, MD       Future Appointments             In 4 days Marcine Matar, MD [facility]   In 3 months Marcine Matar, MD [facility]            Passed - Cr in normal range and within 360 days    Creatinine, Ser  Date Value Ref Range Status  07/23/2022 0.89 0.57 - 1.00 mg/dL Final         Passed - eGFR is 30 or above and within 360 days    GFR calc Af Amer  Date Value Ref Range Status  09/25/2019 >60 >60 mL/min Final   GFR, Estimated  Date Value Ref Range Status  12/20/2021 55 (L) >60 mL/min Final    Comment:    (NOTE) Calculated using the CKD-EPI Creatinine Equation (2021)    eGFR  Date Value Ref Range  Status  07/23/2022 79 >59 mL/min/1.73 Final         Passed - Completed PHQ-2 or PHQ-9 in the last 360 days      Passed - Last BP in normal range    BP Readings from Last 1 Encounters:  12/18/22 131/69

## 2022-12-28 ENCOUNTER — Ambulatory Visit: Payer: 59 | Attending: Internal Medicine | Admitting: Internal Medicine

## 2022-12-28 ENCOUNTER — Other Ambulatory Visit: Payer: Self-pay

## 2022-12-28 ENCOUNTER — Encounter: Payer: Self-pay | Admitting: Internal Medicine

## 2022-12-28 ENCOUNTER — Other Ambulatory Visit (HOSPITAL_COMMUNITY)
Admission: RE | Admit: 2022-12-28 | Discharge: 2022-12-28 | Disposition: A | Discharge status: Home / Self Care | Payer: 59 | Source: Ambulatory Visit | Attending: Internal Medicine | Admitting: Internal Medicine

## 2022-12-28 VITALS — BP 128/84 | HR 105 | Temp 98.2°F | Ht 65.0 in | Wt 311.0 lb

## 2022-12-28 DIAGNOSIS — A5901 Trichomonal vulvovaginitis: Secondary | ICD-10-CM

## 2022-12-28 DIAGNOSIS — Z113 Encounter for screening for infections with a predominantly sexual mode of transmission: Secondary | ICD-10-CM | POA: Insufficient documentation

## 2022-12-28 DIAGNOSIS — R030 Elevated blood-pressure reading, without diagnosis of hypertension: Secondary | ICD-10-CM | POA: Diagnosis not present

## 2022-12-28 DIAGNOSIS — N926 Irregular menstruation, unspecified: Secondary | ICD-10-CM

## 2022-12-28 DIAGNOSIS — Z2821 Immunization not carried out because of patient refusal: Secondary | ICD-10-CM

## 2022-12-28 LAB — POCT URINE PREGNANCY: Preg Test, Ur: NEGATIVE

## 2022-12-28 NOTE — Progress Notes (Signed)
Patient ID: Jenna [name], female    DOB: [birth year]  MRN: [id number]  CC: Exposure to STD (Exposure to STD - partner tested positive for NGU /Intermittent pain on lower R abdomen X1 week)   Subjective: Jenna [name] is a 52 y.o. female who presents for UC visit. Her concerns today include:  Patient with history of morbid obesity on Wegovy through [facility], preDM, OA of the knees, chronic lower back pain.  Depression (on Cymbalta for LBP and mood), vitamin D deficiency     Seen at [facility] 12/18/2022 for vaginal discgh.  Tested positive for Trich and BV. Prescribed Flagyl which she picked up 12/21/2022.  Has 4 more pills left.  Had missed a dose.  She informed her boyfriend who went to  [facility] 4 days ago and was tested. She went with him but did not go in with him to see the provider.  When he came out, he told her that they treated him for trichomonas and "NGU."  Patient was upset because she wanted to know what is NGU.  She has access to his MyChart.  A few days later he received his results and he tested negative for chlamydia and gonorrhea.  Patient is upset and wanting to know why he had to be treated for NGU. She is not having any vaginal discgh or itching.   Requested urine pregnancy test.  She has had a tubal ligation.  Menses have been regular but for the past 2 months she had spotting for 2 days instead of having her normal bleeding pattern  BP elev today.  Thinks due to high emotions today Limits salt in foods  HM: declines flu, Tdapt.    Patient Active Problem List   Diagnosis Date Noted   OSA (obstructive sleep apnea) 11/08/2022   Bilateral leg edema 07/23/2022   Benign neoplasm of ascending colon 07/19/2022   Acute kidney injury (HCC) 03/28/2022   Prediabetes 03/28/2022   Other spondylosis with radiculopathy, cervical region 10/03/2021   Fecal occult blood test positive 07/15/2021   Periodic limb movement 07/11/2021   Loud snoring 01/23/2021   Severe obesity (BMI >= 40)  (HCC) 02/08/2020   Insulin resistance 07/28/2019   Depression 07/28/2019   Vitamin D deficiency 12/09/2018   Class 3 severe obesity without serious comorbidity with body mass index (BMI) of 50.0 to 59.9 in adult (HCC) 01/17/2017   Low back pain 05/17/2016   Unilateral primary osteoarthritis, left knee 05/08/2016   Primary osteoarthritis of both knees 06/27/2015   Closed right fibular fracture 08/10/2014     Current Outpatient Medications on File Prior to Visit  Medication Sig Dispense Refill   DULoxetine (CYMBALTA) 60 MG capsule Take 1 capsule (60 mg total) by mouth daily for back pain and mood. 30 capsule 0   ferrous sulfate 325 (65 FE) MG tablet Take 325 mg by mouth every Wednesday.     furosemide (LASIX) 20 MG tablet Take 1 tablet (20 mg total) by mouth daily. (Patient taking differently: Take 20 mg by mouth as needed for edema or fluid.) 30 tablet 0   gabapentin (NEURONTIN) 300 MG capsule Take 1 capsule (300 mg total) by mouth 3 (three) times daily as needed. (Patient taking differently: Take 300 mg by mouth daily as needed.) 60 capsule 1   hydrOXYzine (ATARAX) 25 MG tablet TAKE 1 TO 2 TABLETS BY MOUTH EVERY 6 TO 8 HOURS AS NEEDED FOR ITCHING 30 tablet 0   metroNIDAZOLE (FLAGYL) 500 MG tablet Take 1 tablet (  500 mg total) by mouth 2 (two) times daily. 14 tablet 0   naproxen (NAPROSYN) 500 MG tablet Take 1 tablet (500 mg total) by mouth 2 (two) times daily with a meal as needed for pain.     ondansetron (ZOFRAN) 4 MG tablet Take 1 tablet (4 mg total) by mouth every 8 (eight) hours as needed for nausea or vomiting. 30 tablet 0   rOPINIRole (REQUIP) 0.5 MG tablet Take 1 tablet (0.5 mg total) by mouth at bedtime. 30 tablet 1   Semaglutide, 1 MG/DOSE, 4 MG/3ML SOPN Inject 1 mg as directed once a week. 3 mL 0   tiZANidine (ZANAFLEX) 4 MG tablet Take 1 tablet (4 mg total) by mouth every 8 (eight) hours as needed for muscle spasms. 60 tablet 0   topiramate (TOPAMAX) 25 MG tablet Take 25 mg (1  pill) at bedtime for one week, then increase to 50 mg (2 pills) at bedtime for one week, then take 75 mg (3 pills) at bedtime for one week, then take 100 mg (4 pills) at bedtime and continue on this dose (Patient taking differently: Take 75 mg by mouth at bedtime.) 120 tablet 6   Vitamin D, Ergocalciferol, (DRISDOL) 1.25 MG (50000 UNIT) CAPS capsule Take 1 capsule (50,000 Units total) by mouth every 7 (seven) days. 12 capsule 1   No current facility-administered medications on file prior to visit.    Allergies  Allergen Reactions   Diflucan [Fluconazole] Hives    Social History   Socioeconomic History   Marital status: Single    Spouse name: Not on file   Number of children: Not on file   Years of education: Not on file   Highest education level: GED or equivalent  Occupational History   Occupation: Stay at home, disabled  Tobacco Use   Smoking status: Former    Current packs/day: 0.00    Types: Cigarettes    Quit date: 10/10/2021    Years since quitting: 1.2   Smokeless tobacco: Never  Vaping Use   Vaping status: Never Used  Substance and Sexual Activity   Alcohol use: No    Alcohol/week: 0.0 standard drinks of alcohol   Drug use: No   Sexual activity: Not on file  Other Topics Concern   Not on file  Social History Narrative   Not on file   Social Determinants of Health   Financial Resource Strain: Low Risk  (12/25/2022)   Overall Financial Resource Strain (CARDIA)    Difficulty of Paying Living Expenses: Not hard at all  Food Insecurity: No Food Insecurity (12/25/2022)   Hunger Vital Sign    Worried About Running Out of Food in the Last Year: Never true    Ran Out of Food in the Last Year: Never true  Transportation Needs: No Transportation Needs (12/25/2022)   PRAPARE - Administrator, Civil Service (Medical): No    Lack of Transportation (Non-Medical): No  Physical Activity: Insufficiently Active (12/25/2022)   Exercise Vital Sign    Days of  Exercise per Week: 3 days    Minutes of Exercise per Session: 10 min  Stress: No Stress Concern Present (12/25/2022)   [employer] of Occupational Health - Occupational Stress Questionnaire    Feeling of Stress : Only a little  Social Connections: Moderately Integrated (12/25/2022)   Social Connection and Isolation Panel [NHANES]    Frequency of Communication with Friends and Family: More than three times a week    Frequency of Social  Gatherings with Friends and Family: More than three times a week    Attends Religious Services: 1 to 4 times per year    Active Member of [employer] or Organizations: No    Attends Banker Meetings: Never    Marital Status: Living with partner  Recent Concern: Social Connections - Moderately Isolated (10/01/2022)   Social Connection and Isolation Panel [NHANES]    Frequency of Communication with Friends and Family: More than three times a week    Frequency of Social Gatherings with Friends and Family: Twice a week    Attends Religious Services: 1 to 4 times per year    Active Member of [employer] or Organizations: No    Attends Banker Meetings: Never    Marital Status: Divorced  Catering manager Violence: Unknown (06/14/2021)   Received from [employer], [facility]   HITS    Physically Hurt: Not on file    Insult or Talk Down To: Not on file    Threaten Physical Harm: Not on file    Scream or Curse: Not on file    Family History  Problem Relation Age of Onset   Colon polyps Mother    Diabetes Mother    Hypertension Mother    Hypertension Father    Breast cancer Neg Hx    Colon cancer Neg Hx    Esophageal cancer Neg Hx    Rectal cancer Neg Hx    Stomach cancer Neg Hx     Past Surgical History:  Procedure Laterality Date   CHOLECYSTECTOMY     COLONOSCOPY WITH PROPOFOL N/A 07/19/2022   Procedure: COLONOSCOPY WITH PROPOFOL;  Surgeon: Jenel Lucks, MD;  Location: [facility] ENDOSCOPY;  Service: Gastroenterology;   Laterality: N/A;   COLONOSCOPY WITH PROPOFOL N/A 10/25/2022   Procedure: COLONOSCOPY WITH PROPOFOL;  Surgeon: Meridee Score Netty Starring., MD;  Location: [facility] ENDOSCOPY;  Service: Gastroenterology;  Laterality: N/A;   ENDOSCOPIC MUCOSAL RESECTION N/A 10/25/2022   Procedure: ENDOSCOPIC MUCOSAL RESECTION;  Surgeon: Meridee Score Netty Starring., MD;  Location: [facility] ENDOSCOPY;  Service: Gastroenterology;  Laterality: N/A;   HEMOSTASIS CLIP PLACEMENT  10/25/2022   Procedure: HEMOSTASIS CLIP PLACEMENT;  Surgeon: Lemar Lofty., MD;  Location: [facility] ENDOSCOPY;  Service: Gastroenterology;;   HOT HEMOSTASIS N/A 10/25/2022   Procedure: HOT HEMOSTASIS (ARGON PLASMA COAGULATION/BICAP);  Surgeon: Lemar Lofty., MD;  Location: [facility] ENDOSCOPY;  Service: Gastroenterology;  Laterality: N/A;   POLYPECTOMY  07/19/2022   Procedure: POLYPECTOMY;  Surgeon: Jenel Lucks, MD;  Location: [facility] ENDOSCOPY;  Service: Gastroenterology;;   SUBMUCOSAL LIFTING INJECTION  07/19/2022   Procedure: SUBMUCOSAL LIFTING INJECTION;  Surgeon: Jenel Lucks, MD;  Location: [facility] ENDOSCOPY;  Service: Gastroenterology;;   Sunnie Nielsen LIFTING INJECTION  10/25/2022   Procedure: SUBMUCOSAL LIFTING INJECTION;  Surgeon: Lemar Lofty., MD;  Location: [facility] ENDOSCOPY;  Service: Gastroenterology;;   SUBMUCOSAL TATTOO INJECTION  10/25/2022   Procedure: SUBMUCOSAL TATTOO INJECTION;  Surgeon: Lemar Lofty., MD;  Location: [facility] ENDOSCOPY;  Service: Gastroenterology;;   TUBAL LIGATION      ROS: Review of Systems Negative except as stated above  PHYSICAL EXAM: BP 128/84   Pulse (!) 105   Temp 98.2 F (36.8 C) (Oral)   Ht 5\' 5"  (1.651 m)   Wt (!) 311 lb (141.1 kg)   LMP 11/05/2022 (Approximate)   SpO2 100%   BMI 51.75 kg/m   Physical Exam  General appearance - alert, well appearing, and in no distress Mental status - normal mood, behavior,  speech, dress, motor activity, and thought processes  Results for orders placed or  performed in visit on 12/28/22  POCT urine pregnancy  Result Value Ref Range   Preg Test, Ur Negative Negative       Latest Ref Rng & Units 07/23/2022   12:13 PM 03/14/2022   10:30 AM 12/20/2021    2:30 PM  CMP  Glucose 70 - 99 mg/dL 846  962  952   BUN 6 - 24 mg/dL 8  10  10    Creatinine 0.57 - 1.00 mg/dL 8.41  3.24  4.01   Sodium 134 - 144 mmol/L 140  139  140   Potassium 3.5 - 5.2 mmol/L 4.7  4.7  4.0   Chloride 96 - 106 mmol/L 108  106  109   CO2 20 - 29 mmol/L 19  20  21    Calcium 8.7 - 10.2 mg/dL 9.0  9.6  9.8   Total Protein 6.0 - 8.5 g/dL 6.8  7.0  7.9   Total Bilirubin 0.0 - 1.2 mg/dL 0.4  0.4  0.5   Alkaline Phos 44 - 121 IU/L 81  78  72   AST 0 - 40 IU/L 24  17  28    ALT 0 - 32 IU/L 28  17  24     Lipid Panel     Component Value Date/Time   CHOL 140 03/14/2022 1030   TRIG 68 03/14/2022 1030   HDL 46 03/14/2022 1030   CHOLHDL 3.8 10/06/2020 1458   LDLCALC 80 03/14/2022 1030    CBC    Component Value Date/Time   WBC 8.1 12/20/2021 1430   RBC 4.57 12/20/2021 1430   HGB 12.5 12/20/2021 1430   HGB 12.5 01/23/2021 1525   HCT 38.9 12/20/2021 1430   HCT 37.4 01/23/2021 1525   PLT 333 12/20/2021 1430   PLT 284 01/23/2021 1525   MCV 85.1 12/20/2021 1430   MCV 88 01/23/2021 1525   MCH 27.4 12/20/2021 1430   MCHC 32.1 12/20/2021 1430   RDW 14.6 12/20/2021 1430   RDW 15.1 01/23/2021 1525   LYMPHSABS 3.8 12/20/2021 1430   LYMPHSABS 2.7 10/06/2020 1458   MONOABS 0.6 12/20/2021 1430   EOSABS 0.2 12/20/2021 1430   EOSABS 0.3 10/06/2020 1458   BASOSABS 0.1 12/20/2021 1430   BASOSABS 0.0 10/06/2020 1458    ASSESSMENT AND PLAN: 1. Trichomonal vaginitis Encourage patient to complete course of treatment with the Flagyl that she has.  Advised to avoid unprotected sex until both she and her boyfriend have completed antibiotic treatment.  2. Screen for STD (sexually transmitted disease) I do not advocate looking in other peoples medical records.  However she states  that her boyfriend is aware that she accesses his MyChart.  I told her that the possible explanation for him being treated for both trichomonas and gonorrhea was that they treated him prophylactically but subsequent testing for gonorrhea and chlamydia was negative.  Nonetheless we can repeat test her.  She is also agreeable to being screened for HIV and syphilis - HIV antibody (with reflex) - RPR w/reflex to TrepSure - Cervicovaginal ancillary only  3. Elevated blood-pressure reading without diagnosis of hypertension Pete blood pressure today much better with diastolic only slightly elevated.  DASH diet discussed and encouraged  4. Menstrual changes Pregnancy test was negative.  Changes in cycle likely due to her being perimenopausal - POCT urine pregnancy  5. Influenza vaccination declined   6. Tetanus, diphtheria, and acellular pertussis (Tdap) vaccination declined  Patient was given the opportunity to ask questions.  Patient verbalized understanding of the plan and was able to repeat key elements of the plan.   This documentation was completed using Paediatric nurse.  Any transcriptional errors are unintentional.  Orders Placed This Encounter  Procedures   HIV antibody (with reflex)   RPR w/reflex to TrepSure   POCT urine pregnancy     Requested Prescriptions    No prescriptions requested or ordered in this encounter    No follow-ups on file.  Jonah Blue, MD, FACP

## 2022-12-31 LAB — TREPONEMAL ANTIBODIES, TPPA: Treponemal Antibodies, TPPA: NONREACTIVE

## 2022-12-31 LAB — CERVICOVAGINAL ANCILLARY ONLY
Bacterial Vaginitis (gardnerella): NEGATIVE
Candida Glabrata: NEGATIVE
Candida Vaginitis: NEGATIVE
Chlamydia: NEGATIVE
Comment: NEGATIVE
Comment: NEGATIVE
Comment: NEGATIVE
Comment: NEGATIVE
Comment: NEGATIVE
Comment: NORMAL
Neisseria Gonorrhea: NEGATIVE
Trichomonas: NEGATIVE

## 2022-12-31 LAB — RPR W/REFLEX TO TREPSURE

## 2022-12-31 LAB — HIV ANTIBODY (ROUTINE TESTING W REFLEX): HIV Screen 4th Generation wRfx: NONREACTIVE

## 2023-01-09 ENCOUNTER — Ambulatory Visit: Payer: 59 | Admitting: Neurology

## 2023-01-09 ENCOUNTER — Ambulatory Visit: Payer: 59 | Admitting: Psychiatry

## 2023-01-09 ENCOUNTER — Telehealth: Payer: Self-pay | Admitting: Neurology

## 2023-01-09 NOTE — Telephone Encounter (Signed)
NS for FU neuro appt. Former patient of Dr. Quentin Mulling.

## 2023-01-14 ENCOUNTER — Ambulatory Visit (INDEPENDENT_AMBULATORY_CARE_PROVIDER_SITE_OTHER): Payer: 59 | Admitting: Internal Medicine

## 2023-01-16 ENCOUNTER — Encounter (INDEPENDENT_AMBULATORY_CARE_PROVIDER_SITE_OTHER): Payer: Self-pay | Admitting: Internal Medicine

## 2023-01-16 ENCOUNTER — Other Ambulatory Visit: Payer: Self-pay

## 2023-01-16 ENCOUNTER — Ambulatory Visit (INDEPENDENT_AMBULATORY_CARE_PROVIDER_SITE_OTHER): Payer: 59 | Admitting: Internal Medicine

## 2023-01-16 DIAGNOSIS — Z6841 Body Mass Index (BMI) 40.0 and over, adult: Secondary | ICD-10-CM

## 2023-01-16 DIAGNOSIS — E66813 Obesity, class 3: Secondary | ICD-10-CM

## 2023-01-16 DIAGNOSIS — R7303 Prediabetes: Secondary | ICD-10-CM

## 2023-01-16 MED ORDER — SEMAGLUTIDE (1 MG/DOSE) 4 MG/3ML ~~LOC~~ SOPN
1.0000 mg | PEN_INJECTOR | SUBCUTANEOUS | 0 refills | Status: DC
Start: 2023-01-16 — End: 2023-02-14
  Filled 2023-01-16: qty 3, 28d supply, fill #0

## 2023-01-16 NOTE — Progress Notes (Signed)
Office: [phone]  /  Fax: [phone]  WEIGHT SUMMARY AND BIOMETRICS  Vitals Temp: 98 F (36.7 C) BP: 115/81 Pulse Rate: 87 SpO2: 100 %   Anthropometric Measurements Height: 5\' 5"  (1.651 m) Weight: (!) 309 lb (140.2 kg) BMI (Calculated): 51.42 Weight at Last Visit: 314 lb Weight Lost Since Last Visit: 5 lb Weight Gained Since Last Visit: 0 lb Starting Weight: 321 lb Total Weight Loss (lbs): 12 lb (5.443 kg) Peak Weight: 321 lb   Body Composition  Body Fat %: 54.7 % Fat Mass (lbs): 169.2 lbs Muscle Mass (lbs): 133.2 lbs Total Body Water (lbs): 105.6 lbs Visceral Fat Rating : 21    No data recorded Today's Visit #: 12  Starting Date: 03/14/22   HPI  Chief Complaint: OBESITY  Jenna [name] is here to discuss her progress with her obesity treatment plan. She is on the the Category 3 Plan and states she is following her eating plan approximately 93 % of the time. She states she is walking  10 minutes 5 times per week.  Interval History:   Discussed the use of AI scribe software for clinical note transcription with the patient, who gave verbal consent to proceed.  History of Present Illness   They report a weight loss of five pounds since the last visit. The patient acknowledges inconsistency in their weight management journey, with periods of weight gain attributed to reverting to old eating habits and life stressors.  The patient is currently on Ozempic 0.75mg  for weight management, which they report has been beneficial. They deny any side effects such as nausea, vomiting, or constipation. The patient notes improved control over portion sizes but struggles with food cravings, particularly during their menstrual cycle. They describe these not as strong cravings but more as temptations to which they occasionally succumb.  The patient also reports physical discomfort, which they attribute to their current weight and which limits their ability to increase physical  activity. They express a desire for increased support and accountability in their weight management journey, acknowledging that they can sometimes be their own worst enemy.  The patient is considering joining a local gym but expresses hesitation due to lack of a workout partner. They express a need for someone to motivate them and keep them focused on their weight management goals. The patient acknowledges the importance of self-care and the desire to improve their health as they age. They express a willingness to increase their Ozempic dose to 1mg  for added appetite control.       Barriers identified: low volume of physical activity at present .   Pharmacotherapy for weight loss: She is currently taking Wegovy with adequate clinical response  and without side effects..    ASSESSMENT AND PLAN  TREATMENT PLAN FOR OBESITY:  Recommended Dietary Goals  [name] is currently in the action stage of change. As such, her goal is to continue weight management plan. She has agreed to: continue current plan  Behavioral Intervention  We discussed the following Behavioral Modification Strategies today: continue to work on maintaining a reduced calorie state, getting the recommended amount of protein, incorporating whole foods, making healthy choices, staying well hydrated and practicing mindfulness when eating..  Additional resources provided today: None  Recommended Physical Activity Goals  [name] has been advised to work up to 150 minutes of moderate intensity aerobic activity a week and strengthening exercises 2-3 times per week for cardiovascular health, weight loss maintenance and preservation of muscle mass.   She has agreed to :  Think about enjoyable ways to increase daily physical activity and overcoming barriers to exercise and Increase physical activity in their day and reduce sedentary time (increase NEAT).  Pharmacotherapy We discussed various medication options to help [name]  with her weight loss efforts and we both agreed to : continue current anti-obesity medication regimen  ASSOCIATED CONDITIONS ADDRESSED TODAY  Prediabetes Assessment & Plan: She has had a progressive increase of hemoglobin A1c now at 6.3 with very high insulin levels.  She had been on metformin in the past but medication was discontinued because of increase in bicarbonate while also taking topiramate.  She has now on semaglutide 1 mg once a week and is medication well.  She will continue at current dose.  Orders: -     Semaglutide (1 MG/DOSE); Inject 1 mg as directed once a week.  Dispense: 3 mL; Refill: 0  Class 3 severe obesity without serious comorbidity with body mass index (BMI) of 50.0 to 59.9 in adult, unspecified obesity type [facility]) Assessment & Plan: See obesity treatment plan  Orders: -     Semaglutide (1 MG/DOSE); Inject 1 mg as directed once a week.  Dispense: 3 mL; Refill: 0    PHYSICAL EXAM:  Blood pressure 115/81, pulse 87, temperature 98 F (36.7 C), height 5\' 5"  (1.651 m), weight (!) 309 lb (140.2 kg), last menstrual period 11/05/2022, SpO2 100%. Body mass index is 51.42 kg/m.  General: She is overweight, cooperative, alert, well developed, and in no acute distress. PSYCH: Has normal mood, affect and thought process.   HEENT: EOMI, sclerae are anicteric. Lungs: Normal breathing effort, no conversational dyspnea. Extremities: No edema.  Neurologic: No gross sensory or motor deficits. No tremors or fasciculations noted.    DIAGNOSTIC DATA REVIEWED:  BMET    Component Value Date/Time   NA 140 07/23/2022 1213   K 4.7 07/23/2022 1213   CL 108 (H) 07/23/2022 1213   CO2 19 (L) 07/23/2022 1213   GLUCOSE 105 (H) 07/23/2022 1213   GLUCOSE 118 (H) 12/20/2021 1430   BUN 8 07/23/2022 1213   CREATININE 0.89 07/23/2022 1213   CALCIUM 9.0 07/23/2022 1213   GFRNONAA 55 (L) 12/20/2021 1430   GFRAA >60 09/25/2019 1548   Lab Results  Component Value Date   HGBA1C 6.3  (H) 03/14/2022   HGBA1C 5.6 12/03/2017   Lab Results  Component Value Date   INSULIN 41.6 (H) 03/14/2022   INSULIN 7.4 12/08/2018   Lab Results  Component Value Date   TSH 1.655 12/20/2021   CBC    Component Value Date/Time   WBC 8.1 12/20/2021 1430   RBC 4.57 12/20/2021 1430   HGB 12.5 12/20/2021 1430   HGB 12.5 01/23/2021 1525   HCT 38.9 12/20/2021 1430   HCT 37.4 01/23/2021 1525   PLT 333 12/20/2021 1430   PLT 284 01/23/2021 1525   MCV 85.1 12/20/2021 1430   MCV 88 01/23/2021 1525   MCH 27.4 12/20/2021 1430   MCHC 32.1 12/20/2021 1430   RDW 14.6 12/20/2021 1430   RDW 15.1 01/23/2021 1525   Iron Studies    Component Value Date/Time   IRON 53 04/02/2019 1510   TIBC 360 04/02/2019 1510   FERRITIN 26 04/02/2019 1510   IRONPCTSAT 15 04/02/2019 1510   Lipid Panel     Component Value Date/Time   CHOL 140 03/14/2022 1030   TRIG 68 03/14/2022 1030   HDL 46 03/14/2022 1030   CHOLHDL 3.8 10/06/2020 1458   LDLCALC 80 03/14/2022 1030  Hepatic Function Panel     Component Value Date/Time   PROT 6.8 07/23/2022 1213   ALBUMIN 3.9 07/23/2022 1213   AST 24 07/23/2022 1213   ALT 28 07/23/2022 1213   ALKPHOS 81 07/23/2022 1213   BILITOT 0.4 07/23/2022 1213      Component Value Date/Time   TSH 1.655 12/20/2021 1430   TSH 1.430 01/23/2021 1525   Nutritional Lab Results  Component Value Date   VD25OH 28.3 (L) 07/23/2022   VD25OH 14.6 (L) 03/14/2022   VD25OH 14.8 (L) 01/23/2021     Return in about 4 weeks (around 02/13/2023) for For Weight Mangement with Dr. Rikki Spearing.[name] She was informed of the importance of frequent follow up visits to maximize her success with intensive lifestyle modifications for her multiple health conditions.   ATTESTASTION STATEMENTS:  Reviewed by clinician on day of visit: allergies, medications, problem list, medical history, surgical history, family history, social history, and previous encounter notes.     Worthy Rancher, MD

## 2023-01-16 NOTE — Assessment & Plan Note (Signed)
She has had a progressive increase of hemoglobin A1c now at 6.3 with very high insulin levels.  She had been on metformin in the past but medication was discontinued because of increase in bicarbonate while also taking topiramate.  She has now on semaglutide 1 mg once a week and is medication well.  She will continue at current dose.

## 2023-01-16 NOTE — Assessment & Plan Note (Signed)
 See obesity treatment plan

## 2023-01-17 ENCOUNTER — Other Ambulatory Visit: Payer: Self-pay

## 2023-02-14 ENCOUNTER — Encounter (INDEPENDENT_AMBULATORY_CARE_PROVIDER_SITE_OTHER): Payer: Self-pay | Admitting: Internal Medicine

## 2023-02-14 ENCOUNTER — Other Ambulatory Visit: Payer: Self-pay | Admitting: Orthopaedic Surgery

## 2023-02-14 ENCOUNTER — Encounter: Payer: Self-pay | Admitting: Internal Medicine

## 2023-02-14 ENCOUNTER — Other Ambulatory Visit: Payer: Self-pay

## 2023-02-14 ENCOUNTER — Other Ambulatory Visit: Payer: Self-pay | Admitting: Internal Medicine

## 2023-02-14 ENCOUNTER — Ambulatory Visit (INDEPENDENT_AMBULATORY_CARE_PROVIDER_SITE_OTHER): Payer: 59 | Admitting: Internal Medicine

## 2023-02-14 VITALS — BP 130/80 | HR 74 | Temp 98.5°F | Ht 65.0 in | Wt 304.0 lb

## 2023-02-14 DIAGNOSIS — Z6841 Body Mass Index (BMI) 40.0 and over, adult: Secondary | ICD-10-CM

## 2023-02-14 DIAGNOSIS — F32A Depression, unspecified: Secondary | ICD-10-CM

## 2023-02-14 DIAGNOSIS — R7303 Prediabetes: Secondary | ICD-10-CM

## 2023-02-14 DIAGNOSIS — E66813 Obesity, class 3: Secondary | ICD-10-CM

## 2023-02-14 DIAGNOSIS — G4733 Obstructive sleep apnea (adult) (pediatric): Secondary | ICD-10-CM

## 2023-02-14 DIAGNOSIS — M545 Low back pain, unspecified: Secondary | ICD-10-CM

## 2023-02-14 MED ORDER — DULOXETINE HCL 60 MG PO CPEP
60.0000 mg | ORAL_CAPSULE | Freq: Every day | ORAL | 1 refills | Status: DC
  Filled 2023-02-14 – 2023-03-18 (×2): qty 30, 30d supply, fill #0
  Filled 2023-04-14: qty 30, 30d supply, fill #1

## 2023-02-14 MED ORDER — SEMAGLUTIDE (1 MG/DOSE) 4 MG/3ML ~~LOC~~ SOPN
1.0000 mg | PEN_INJECTOR | SUBCUTANEOUS | 1 refills | Status: DC
  Filled 2023-02-14: qty 3, 28d supply, fill #0
  Filled 2023-03-18: qty 3, 28d supply, fill #1

## 2023-02-14 NOTE — Assessment & Plan Note (Signed)
 She has had a progressive increase of hemoglobin A1c now at 6.3 with very high insulin levels.  She had been on metformin in the past but medication was discontinued because of increase in bicarbonate while also taking topiramate.  She has now on semaglutide 1 mg once a week and is medication well.  She will continue at current dose.

## 2023-02-14 NOTE — Progress Notes (Signed)
Office: [phone]  /  Fax: [phone]  Weight Summary And Biometrics  Vitals Temp: 98.5 F (36.9 C) BP: 130/80 Pulse Rate: 74 SpO2: 99 %   Anthropometric Measurements Height: 5\' 5"  (1.651 m) Weight: (!) 304 lb (137.9 kg) BMI (Calculated): 50.59 Weight at Last Visit: 309 lb Weight Lost Since Last Visit: 5 lb Weight Gained Since Last Visit: 0 lb Starting Weight: 321 lb Total Weight Loss (lbs): 17 lb (7.711 kg) Peak Weight: 321 lb   Body Composition  Body Fat %: 50.3 % Fat Mass (lbs): 153 lbs Muscle Mass (lbs): 143.6 lbs Total Body Water (lbs): 98.8 lbs Visceral Fat Rating : 19    No data recorded Today's Visit #: 13  Starting Date: 03/14/22   Subjective   Chief Complaint: Obesity  Kitana is here to discuss her progress with her obesity treatment plan. She is on the the Category 3 Plan and states she is following her eating plan approximately 90 % of the time. She states she is exercising 5 minutes 3 times per week.  Interval History:   Since last office visit she has lost 5 pounds. She reports good adherence to reduced calorie nutritional plan. She has been working on reading food labels, not skipping meals, increasing protein intake at every meal, drinking more water, avoiding or reducing simple and processed carbohydrates, making healthier choices, reducing portion sizes, and incorporating more whole foods   Orexigenic Control:  Denies problems with appetite and hunger signals.  Denies problems with satiety and satiation.  Denies problems with eating patterns and portion control.  Denies abnormal cravings. Denies feeling deprived or restricted.   Barriers identified: strong hunger signals and impaired satiety / inhibitory control, low volume of physical activity at present , orthopedic problems, medical conditions or chronic pain affecting mobility, medical comorbidities, and difficulty maintaining a reduced calorie state.   Pharmacotherapy for  weight loss: She is currently taking  semaglutide 1 mg once a week  Assessment and Plan   Treatment Plan For Obesity:  Recommended Dietary Goals  Diore is currently in the action stage of change. As such, her goal is to continue weight management plan. She has agreed to: continue current plan  Behavioral Intervention  We discussed the following Behavioral Modification Strategies today: continue to work on maintaining a reduced calorie state, getting the recommended amount of protein, incorporating whole foods, making healthy choices, staying well hydrated and practicing mindfulness when eating..  Additional resources provided today: Handout on symptoms of sleep apnea, health risk and its effect on weight management  Recommended Physical Activity Goals  Kenyona has been advised to work up to 150 minutes of moderate intensity aerobic activity a week and strengthening exercises 2-3 times per week for cardiovascular health, weight loss maintenance and preservation of muscle mass.   She has agreed to :  Start strengthening exercises with a goal of 2-3 sessions a week   Pharmacotherapy  We discussed various medication options to help Renelda with her weight loss efforts and we both agreed to : continue with nutritional and behavioral strategies and continue current anti-obesity medication regimen  Associated Conditions Addressed Today  Prediabetes Assessment & Plan: She has had a progressive increase of hemoglobin A1c now at 6.3 with very high insulin levels.  She had been on metformin in the past but medication was discontinued because of increase in bicarbonate while also taking topiramate.  She has now on semaglutide 1 mg once a week and is medication well.  She will continue  at current dose.  Orders: -     Semaglutide (1 MG/DOSE); Inject 1 mg as directed once a week.  Dispense: 3 mL; Refill: 1  Class 3 severe obesity without serious comorbidity with body mass index (BMI) of  50.0 to 59.9 in adult, unspecified obesity type (HCC) -     Semaglutide (1 MG/DOSE); Inject 1 mg as directed once a week.  Dispense: 3 mL; Refill: 1  OSA (obstructive sleep apnea)    Assessment and Plan    Obesity Bronita has made significant progress in her weight loss journey, losing 17 pounds since January to weigh in at 304 pounds. She adheres to a diet consisting of chicken, shrimp, rice, and water, steering clear of junk food. The use of Ozempic 1 mg has proven effective in suppressing appetite and aiding in portion control, although exercise is limited due to knee pain. We discussed the importance of portion control, a balanced diet, and physical activity for maintaining muscle mass and toning skin during weight loss. Chair exercises via YouTube were recommended to mitigate knee stress, and the importance of gradual weight loss at a rate of 1-2 pounds per week was emphasized to prevent muscle loss and redundant skin. The necessity of staying hydrated to prevent constipation while on Ozempic was highlighted. We will continue Ozempic 1 mg, encourage a balanced diet inclusive of fruits and vegetables, recommend chair exercises, and suggest the use of protein shakes without replacing meals. A follow-up is scheduled for January.  Prediabetes The importance of weight management and dietary changes was discussed to prevent the progression to diabetes, with Ozempic also playing a role in managing blood glucose levels. We emphasized the significance of maintaining a balanced diet. The current dietary and exercise regimen will continue with regular monitoring of blood glucose levels.  Sleep Apnea Bronita has not undergone a follow-up sleep study in the past two years, with symptoms such as morning headaches, dry mouth, and sore throat suggesting possible untreated sleep apnea. We discussed how untreated sleep apnea could impact hunger hormones, metabolism, memory, mood, and energy levels, underscoring  the importance of addressing sleep apnea to enhance overall health and support weight loss efforts.  I again encouraged her to follow-up with her primary care team as she was never started on PAP therapy following sleep study.  General Health Maintenance The discussion covered the importance of hydration to avoid constipation, especially pertinent while on Ozempic. Physical activity was encouraged to maintain muscle mass and tone skin during weight loss, along with portion control and a balanced diet rich in fruits and vegetables. Hydration, physical activity, and a balanced diet will continue to be encouraged.  Follow-up A follow-up appointment is scheduled for January.         Objective   Physical Exam:  Blood pressure 130/80, pulse 74, temperature 98.5 F (36.9 C), height 5\' 5"  (1.651 m), weight (!) 304 lb (137.9 kg), SpO2 99%. Body mass index is 50.59 kg/m.  General: She is overweight, cooperative, alert, well developed, and in no acute distress. PSYCH: Has normal mood, affect and thought process.   HEENT: EOMI, sclerae are anicteric. Lungs: Normal breathing effort, no conversational dyspnea. Extremities: No edema.  Neurologic: No gross sensory or motor deficits. No tremors or fasciculations noted.    Diagnostic Data Reviewed:  BMET    Component Value Date/Time   NA 140 07/23/2022 1213   K 4.7 07/23/2022 1213   CL 108 (H) 07/23/2022 1213   CO2 19 (L) 07/23/2022 1213  GLUCOSE 105 (H) 07/23/2022 1213   GLUCOSE 118 (H) 12/20/2021 1430   BUN 8 07/23/2022 1213   CREATININE 0.89 07/23/2022 1213   CALCIUM 9.0 07/23/2022 1213   GFRNONAA 55 (L) 12/20/2021 1430   GFRAA >60 09/25/2019 1548   Lab Results  Component Value Date   HGBA1C 6.3 (H) 03/14/2022   HGBA1C 5.6 12/03/2017   Lab Results  Component Value Date   INSULIN 41.6 (H) 03/14/2022   INSULIN 7.4 12/08/2018   Lab Results  Component Value Date   TSH 1.655 12/20/2021   CBC    Component Value Date/Time    WBC 8.1 12/20/2021 1430   RBC 4.57 12/20/2021 1430   HGB 12.5 12/20/2021 1430   HGB 12.5 01/23/2021 1525   HCT 38.9 12/20/2021 1430   HCT 37.4 01/23/2021 1525   PLT 333 12/20/2021 1430   PLT 284 01/23/2021 1525   MCV 85.1 12/20/2021 1430   MCV 88 01/23/2021 1525   MCH 27.4 12/20/2021 1430   MCHC 32.1 12/20/2021 1430   RDW 14.6 12/20/2021 1430   RDW 15.1 01/23/2021 1525   Iron Studies    Component Value Date/Time   IRON 53 04/02/2019 1510   TIBC 360 04/02/2019 1510   FERRITIN 26 04/02/2019 1510   IRONPCTSAT 15 04/02/2019 1510   Lipid Panel     Component Value Date/Time   CHOL 140 03/14/2022 1030   TRIG 68 03/14/2022 1030   HDL 46 03/14/2022 1030   CHOLHDL 3.8 10/06/2020 1458   LDLCALC 80 03/14/2022 1030   Hepatic Function Panel     Component Value Date/Time   PROT 6.8 07/23/2022 1213   ALBUMIN 3.9 07/23/2022 1213   AST 24 07/23/2022 1213   ALT 28 07/23/2022 1213   ALKPHOS 81 07/23/2022 1213   BILITOT 0.4 07/23/2022 1213      Component Value Date/Time   TSH 1.655 12/20/2021 1430   TSH 1.430 01/23/2021 1525   Nutritional Lab Results  Component Value Date   VD25OH 28.3 (L) 07/23/2022   VD25OH 14.6 (L) 03/14/2022   VD25OH 14.8 (L) 01/23/2021    Follow-Up   Return in about 4 weeks (around 03/14/2023) for For Weight Mangement with Dr. Rikki Spearing.[name] She was informed of the importance of frequent follow up visits to maximize her success with intensive lifestyle modifications for her multiple health conditions.  Attestation Statement   Reviewed by clinician on day of visit: allergies, medications, problem list, medical history, surgical history, family history, social history, and previous encounter notes.     Worthy Rancher, MD

## 2023-02-15 ENCOUNTER — Other Ambulatory Visit: Payer: Self-pay

## 2023-02-15 MED ORDER — TIZANIDINE HCL 4 MG PO TABS
4.0000 mg | ORAL_TABLET | Freq: Three times a day (TID) | ORAL | 0 refills | Status: DC | PRN
  Filled 2023-02-15: qty 60, 20d supply, fill #0

## 2023-02-26 ENCOUNTER — Other Ambulatory Visit: Payer: Self-pay

## 2023-02-27 ENCOUNTER — Other Ambulatory Visit: Payer: Self-pay

## 2023-03-18 ENCOUNTER — Other Ambulatory Visit: Payer: Self-pay

## 2023-03-19 ENCOUNTER — Ambulatory Visit (INDEPENDENT_AMBULATORY_CARE_PROVIDER_SITE_OTHER): Payer: 59 | Admitting: Family Medicine

## 2023-03-19 ENCOUNTER — Other Ambulatory Visit: Payer: Self-pay

## 2023-03-19 ENCOUNTER — Encounter (INDEPENDENT_AMBULATORY_CARE_PROVIDER_SITE_OTHER): Payer: Self-pay | Admitting: Family Medicine

## 2023-03-19 VITALS — BP 164/78 | HR 84 | Temp 98.2°F | Ht 65.0 in | Wt 308.0 lb

## 2023-03-19 DIAGNOSIS — G43909 Migraine, unspecified, not intractable, without status migrainosus: Secondary | ICD-10-CM | POA: Diagnosis not present

## 2023-03-19 DIAGNOSIS — Z6841 Body Mass Index (BMI) 40.0 and over, adult: Secondary | ICD-10-CM

## 2023-03-19 DIAGNOSIS — R7303 Prediabetes: Secondary | ICD-10-CM | POA: Diagnosis not present

## 2023-03-19 DIAGNOSIS — D122 Benign neoplasm of ascending colon: Secondary | ICD-10-CM | POA: Diagnosis not present

## 2023-03-19 DIAGNOSIS — R030 Elevated blood-pressure reading, without diagnosis of hypertension: Secondary | ICD-10-CM | POA: Diagnosis not present

## 2023-03-19 DIAGNOSIS — E66813 Obesity, class 3: Secondary | ICD-10-CM

## 2023-03-19 MED ORDER — SEMAGLUTIDE (1 MG/DOSE) 4 MG/3ML ~~LOC~~ SOPN
1.0000 mg | PEN_INJECTOR | SUBCUTANEOUS | 1 refills | Status: DC
Start: 2023-03-19 — End: 2023-04-02
  Filled 2023-03-19 – 2023-03-29 (×2): qty 3, 28d supply, fill #0

## 2023-03-19 NOTE — Progress Notes (Signed)
 Jenna [name], D.O.  ABFM, ABOM Specializing in Clinical Bariatric Medicine  Office located at: [address] [place], [place]  [address]   Assessment and Plan:   FOR THE DISEASE OF OBESITY: Since last office visit on 02/14/23 patient's  muscle mass has decreased by 0.2 lb. Fat mass has increased by 4.6 lb. Total body water has increased by 2.6 lb.  Counseling done on how various foods will affect these numbers and how to maximize success  Total lbs lost to date: 13 lbs  Total weight loss percentage to date: 4.05%    Recommended Dietary Goals Jenna [name] is currently in the action stage of change. As such, her goal is to continue weight management plan.  She has agreed to: continue current plan   Behavioral Intervention We discussed the following today: continue to work on maintaining a reduced calorie state, getting the recommended amount of protein, incorporating whole foods, making healthy choices, staying well hydrated and practicing mindfulness when eating.  Additional resources provided today: None  Evidence-based interventions for health behavior change were utilized today including the discussion of self monitoring techniques, problem-solving barriers and SMART goal setting techniques.   Regarding patient's less desirable eating habits and patterns, we employed the technique of small changes.   Pt will specifically work on: n/a    Recommended Physical Activity Goals Jenna [name] has been advised to work up to 150 minutes of moderate intensity aerobic activity a week and strengthening exercises 2-3 times per week for cardiovascular health, weight loss maintenance and preservation of muscle mass.   She has agreed to : Continue current level of physical activity    Pharmacotherapy We both agreed to : continue with nutritional and behavioral strategies and continue current anti-obesity medication regimen   FOR ASSOCIATED CONDITIONS ADDRESSED TODAY:  Class 3 severe  obesity without serious comorbidity with body mass index (BMI) of 50.0 to 59.9 in adult, unspecified obesity type (HCC) Assessment & Plan: Pt currently on Semaglutide  1 mg once a week. Pt denies any SE from GLP-1 therapy when eating on plan. She takes her Semaglutide  every Tuesday and does report a waning effect during the weekend. Pt recommended to take her Semaglutide  every Thursday to have better coverage over the weekend. Will consider increasing Semaglutide  next OV. Continue with reduced calorie nutritional plan.    Elevated blood pressure reading without diagnosis of hypertension Assessment & Plan: Last 3 blood pressure readings in our office are as follows: BP Readings from Last 3 Encounters:  03/19/23 (!) 164/78  02/14/23 130/80  01/16/23 115/81   Pt's blood pressure above target today - she feels that this could be secondary to eating foods rich in sugars and salts in the last few days. She is completely asymptomatic. Pt agrees to decrease sugar and salt intake (in other words, follow meal plan more closely) and advance exercise as tolerated. Will continue to monitor condition.    Prediabetes Assessment & Plan: Pt endorses eating more carbohydrates (breads, sweet potatoes, etc) over the holiday period. She has been relatively back on track food wise. Educated patient that having protein with each meal is important for  controlling hunger and cravings. Continue with weight loss therapy.  Recheck Hemoglobin A1c and Vitamin D  in the near future.    Migraine without status migrainosus, not intractable, unspecified migraine type Assessment & Plan Pt's neurologist has had her on Topamax  75 mg for many years for dizziness and headaches. Pt reports that lately she has not been consisently taking  her Topamax . As a result, she endorses having increased cravings and headaches. Pt agrees to take her Topamax  as prescribed. Will continue to monitor condition.   Benign neoplasm of ascending  colon Assessment & Plan: Reminded pt that she has a benign neoplasm of the colon. Discussed with pt how certain foods like smoked meats can increase risk of colon cancer. Pt understands that being obese can also increase the risk of this cancer. Continue prudent nutritional plan.    Follow up:   Return 04/02/23. She was informed of the importance of frequent follow up visits to maximize her success with intensive lifestyle modifications for her multiple health conditions.  Subjective:   Chief complaint: Obesity Jenna [name] is here to discuss her progress with her obesity treatment plan. She is on the Category 3 Plan and states she is following her eating plan approximately 93% of the time. She states she is doing chair exercises 3-7 minutes, 2-3 days a week & walking 5-10 minutes, 7 days a week.   Interval History:  Jenna [name] is here for a follow up office visit. First encounter with pt; she is typically seen by Dr.Maldonado. She is up 4 lbs since last OV. She endorses eating more carbohydrates (breads, sweet potatoes, etc) over the holiday period. She has been relatively back on track food wise.  Pharmacotherapy for weight loss: She is currently taking  Semagutide 1 mg once a week .   Review of Systems:  Pertinent positives were addressed with patient today.  Reviewed by clinician on day of visit: allergies, medications, problem list, medical history, surgical history, family history, social history, and previous encounter notes.  Weight Summary and Biometrics   Weight Lost Since Last Visit: 0lb  Weight Gained Since Last Visit: 4lb   Vitals Temp: 98.2 F (36.8 C) BP: (!) 164/78 Pulse Rate: 84 SpO2: 100 %   Anthropometric Measurements Height: 5' 5 (1.651 m) Weight: (!) 308 lb (139.7 kg) BMI (Calculated): 51.25 Weight at Last Visit: 304lb Weight Lost Since Last Visit: 0lb Weight Gained Since Last Visit: 4lb Starting Weight: 321lb Total Weight Loss (lbs): 13 lb (5.897  kg) Peak Weight: 321lb   Body Composition  Body Fat %: 51.1 % Fat Mass (lbs): 157.6 lbs Muscle Mass (lbs): 143.4 lbs Total Body Water (lbs): 101.4 lbs Visceral Fat Rating : 19   Other Clinical Data Fasting: no Labs: no Today's Visit #: 14 Starting Date: 03/14/22   Objective:   PHYSICAL EXAM: Blood pressure (!) 164/78, pulse 84, temperature 98.2 F (36.8 C), height 5' 5 (1.651 m), weight (!) 308 lb (139.7 kg), SpO2 100%. Body mass index is 51.25 kg/m.  General: she is overweight, cooperative and in no acute distress. PSYCH: Has normal mood, affect and thought process.   HEENT: EOMI, sclerae are anicteric. Lungs: Normal breathing effort, no conversational dyspnea. Extremities: Moves * 4 Neurologic: A and O * 3, good insight  DIAGNOSTIC DATA REVIEWED: BMET    Component Value Date/Time   NA 140 07/23/2022 1213   K 4.7 07/23/2022 1213   CL 108 (H) 07/23/2022 1213   CO2 19 (L) 07/23/2022 1213   GLUCOSE 105 (H) 07/23/2022 1213   GLUCOSE 118 (H) 12/20/2021 1430   BUN 8 07/23/2022 1213   CREATININE 0.89 07/23/2022 1213   CALCIUM 9.0 07/23/2022 1213   GFRNONAA 55 (L) 12/20/2021 1430   GFRAA >60 09/25/2019 1548   Lab Results  Component Value Date   HGBA1C 6.3 (H) 03/14/2022   HGBA1C 5.6 12/03/2017  Lab Results  Component Value Date   INSULIN  41.6 (H) 03/14/2022   INSULIN  7.4 12/08/2018   Lab Results  Component Value Date   TSH 1.655 12/20/2021   CBC    Component Value Date/Time   WBC 8.1 12/20/2021 1430   RBC 4.57 12/20/2021 1430   HGB 12.5 12/20/2021 1430   HGB 12.5 01/23/2021 1525   HCT 38.9 12/20/2021 1430   HCT 37.4 01/23/2021 1525   PLT 333 12/20/2021 1430   PLT 284 01/23/2021 1525   MCV 85.1 12/20/2021 1430   MCV 88 01/23/2021 1525   MCH 27.4 12/20/2021 1430   MCHC 32.1 12/20/2021 1430   RDW 14.6 12/20/2021 1430   RDW 15.1 01/23/2021 1525   Iron Studies    Component Value Date/Time   IRON 53 04/02/2019 1510   TIBC 360 04/02/2019 1510    FERRITIN 26 04/02/2019 1510   IRONPCTSAT 15 04/02/2019 1510   Lipid Panel     Component Value Date/Time   CHOL 140 03/14/2022 1030   TRIG 68 03/14/2022 1030   HDL 46 03/14/2022 1030   CHOLHDL 3.8 10/06/2020 1458   LDLCALC 80 03/14/2022 1030   Hepatic Function Panel     Component Value Date/Time   PROT 6.8 07/23/2022 1213   ALBUMIN 3.9 07/23/2022 1213   AST 24 07/23/2022 1213   ALT 28 07/23/2022 1213   ALKPHOS 81 07/23/2022 1213   BILITOT 0.4 07/23/2022 1213      Component Value Date/Time   TSH 1.655 12/20/2021 1430   TSH 1.430 01/23/2021 1525   Nutritional Lab Results  Component Value Date   VD25OH 28.3 (L) 07/23/2022   VD25OH 14.6 (L) 03/14/2022   VD25OH 14.8 (L) 01/23/2021    Attestations:   I, Special Puri, acting as a stage manager for Jenna Jenkins, DO., have compiled all relevant documentation for today's office visit on behalf of Jenna Jenkins, DO, while in the presence of [employer], DO.  Reviewed by clinician on day of visit: allergies, medications, problem list, medical history, surgical history, family history, social history, and previous encounter notes pertinent to patient's obesity diagnosis.I have spent 40  minutes in the care of the patient today including: preparing to see patient (e.g. review and interpretation of tests, old notes ), obtaining and/or reviewing separately obtained history, performing a medically appropriate examination or evaluation, counseling and educating the patient, ordering medications, test or procedures, documenting clinical information in the electronic or other health care record, and independently interpreting results and communicating results to the patient, family, or caregiver   I have reviewed the above documentation for accuracy and completeness, and I agree with the above. Jenna JINNY [name], D.O.  The 21st Century Cures Act was signed into law in 2016 which includes the topic of electronic health records.   This provides immediate access to information in MyChart.  This includes consultation notes, operative notes, office notes, lab results and pathology reports.  If you have any questions about what you read please let us  know at your next visit so we can discuss your concerns and take corrective action if need be.  We are right here with you.

## 2023-03-20 ENCOUNTER — Telehealth (INDEPENDENT_AMBULATORY_CARE_PROVIDER_SITE_OTHER): Payer: Self-pay

## 2023-03-20 ENCOUNTER — Other Ambulatory Visit: Payer: Self-pay

## 2023-03-20 ENCOUNTER — Telehealth (INDEPENDENT_AMBULATORY_CARE_PROVIDER_SITE_OTHER): Payer: Self-pay | Admitting: Family Medicine

## 2023-03-20 NOTE — Telephone Encounter (Signed)
 Patient called in stating she needs prior authorization for Ozempic 1 mg. Stated the pharmacy told her to go to cover my meds and enter this in [id number].

## 2023-03-20 NOTE — Telephone Encounter (Signed)
 Prior auth started Key: [id number]

## 2023-03-21 ENCOUNTER — Encounter (INDEPENDENT_AMBULATORY_CARE_PROVIDER_SITE_OTHER): Payer: Self-pay

## 2023-03-21 ENCOUNTER — Telehealth (INDEPENDENT_AMBULATORY_CARE_PROVIDER_SITE_OTHER): Payer: Self-pay

## 2023-03-21 NOTE — Telephone Encounter (Signed)
 Patient called in stating her authorization for her Ozempic pen was not approved. She states something about her diagnosis that was entered is causing the [employer] to deny. She is asking for someone to correct and resubmit today.

## 2023-03-21 NOTE — Telephone Encounter (Signed)
 Expedited appeal started. Reference number [id number]  P [phone] F [id number]   Up to 72  hr turn around time for answer. Pt is intolerant to Metformin, Prediabetes and Class 3 obesity.   Pt notified and given reference number as well.

## 2023-03-21 NOTE — Telephone Encounter (Signed)
 Request Reference Number: [id number]. OZEMPIC INJ 4MG /3ML is denied for not meeting the prior authorization requirement(s).

## 2023-03-25 ENCOUNTER — Encounter (INDEPENDENT_AMBULATORY_CARE_PROVIDER_SITE_OTHER): Payer: Self-pay

## 2023-03-25 ENCOUNTER — Ambulatory Visit: Payer: 59 | Admitting: Neurology

## 2023-03-25 ENCOUNTER — Encounter: Payer: Self-pay | Admitting: Infectious Diseases

## 2023-03-26 ENCOUNTER — Telehealth (INDEPENDENT_AMBULATORY_CARE_PROVIDER_SITE_OTHER): Payer: Self-pay | Admitting: Internal Medicine

## 2023-03-26 ENCOUNTER — Ambulatory Visit (INDEPENDENT_AMBULATORY_CARE_PROVIDER_SITE_OTHER): Payer: 59 | Admitting: Internal Medicine

## 2023-03-26 ENCOUNTER — Other Ambulatory Visit: Payer: Self-pay

## 2023-03-26 NOTE — Telephone Encounter (Signed)
 Pt called in asking that Jenna [name] call her about the "ozempic situation." Please reach out to the pt.

## 2023-03-27 NOTE — Telephone Encounter (Signed)
 Spoke to pt and advised it was denied with the appeal and will discuss other options at he appt on 1/21

## 2023-04-01 ENCOUNTER — Other Ambulatory Visit: Payer: Self-pay

## 2023-04-01 ENCOUNTER — Telehealth: Payer: Self-pay | Admitting: Psychiatry

## 2023-04-01 MED ORDER — TOPIRAMATE 25 MG PO TABS
75.0000 mg | ORAL_TABLET | Freq: Every day | ORAL | 2 refills | Status: DC
  Filled 2023-04-01: qty 90, 30d supply, fill #0

## 2023-04-01 NOTE — Telephone Encounter (Signed)
Last seen 05/09/22 and next f/u scheduled for 06/19/23 w/ Dr. Jerelyn Scott pt and LVM letting her know refill sent to [facility] until her appt w/ Dr. Frances Furbish.

## 2023-04-01 NOTE — Telephone Encounter (Signed)
Pt has r/s her missed appointment and is on wait list, pt is asking if she can now get a refill on her  topiramate (TOPAMAX) 25 MG tablet Pt states she has been without the medication for 4 days now, she'd like to get enough of a refill until she is seen here in office.

## 2023-04-02 ENCOUNTER — Ambulatory Visit (INDEPENDENT_AMBULATORY_CARE_PROVIDER_SITE_OTHER): Payer: 59 | Admitting: Internal Medicine

## 2023-04-02 ENCOUNTER — Encounter (INDEPENDENT_AMBULATORY_CARE_PROVIDER_SITE_OTHER): Payer: Self-pay | Admitting: Internal Medicine

## 2023-04-02 ENCOUNTER — Other Ambulatory Visit: Payer: Self-pay

## 2023-04-02 VITALS — BP 150/90 | HR 93 | Temp 98.2°F | Ht 65.0 in | Wt 314.0 lb

## 2023-04-02 DIAGNOSIS — R600 Localized edema: Secondary | ICD-10-CM | POA: Diagnosis not present

## 2023-04-02 DIAGNOSIS — R638 Other symptoms and signs concerning food and fluid intake: Secondary | ICD-10-CM

## 2023-04-02 DIAGNOSIS — E66813 Obesity, class 3: Secondary | ICD-10-CM

## 2023-04-02 DIAGNOSIS — Z6841 Body Mass Index (BMI) 40.0 and over, adult: Secondary | ICD-10-CM

## 2023-04-02 DIAGNOSIS — E88819 Insulin resistance, unspecified: Secondary | ICD-10-CM

## 2023-04-02 DIAGNOSIS — G4733 Obstructive sleep apnea (adult) (pediatric): Secondary | ICD-10-CM | POA: Diagnosis not present

## 2023-04-02 DIAGNOSIS — R030 Elevated blood-pressure reading, without diagnosis of hypertension: Secondary | ICD-10-CM

## 2023-04-02 DIAGNOSIS — R7303 Prediabetes: Secondary | ICD-10-CM

## 2023-04-02 MED ORDER — FUROSEMIDE 20 MG PO TABS
20.0000 mg | ORAL_TABLET | ORAL | 0 refills | Status: DC | PRN
Start: 2023-04-02 — End: 2023-05-27
  Filled 2023-04-02: qty 30, 30d supply, fill #0

## 2023-04-02 MED ORDER — SEMAGLUTIDE (1 MG/DOSE) 4 MG/3ML ~~LOC~~ SOPN
1.0000 mg | PEN_INJECTOR | SUBCUTANEOUS | 1 refills | Status: DC
Start: 2023-04-02 — End: 2023-04-29

## 2023-04-02 NOTE — Assessment & Plan Note (Signed)
She has had a progressive increase of hemoglobin A1c now at 6.3 with very high insulin levels.  She had been on metformin in the past but medication was discontinued because of increase in bicarbonate while also taking topiramate.  She had been doing well on semaglutide but medication was recently denied by her insurance.  We will resubmit prescription again and follow through with prior authorization.

## 2023-04-02 NOTE — Assessment & Plan Note (Signed)
Jenna [name] has been with [place] since January 2024 she has had fluctuations in her weight loss with the most recent weight gain of 6 pounds after discontinuation of semaglutide and topiramate because of insurance denial and running out of medication respectively.  She has strong orexigenic signaling and has high risk underlying comorbidities which make weight loss very difficult for her.  She benefits from pharmacotherapy.  Under the circumstances GLP-1 as medication will help reduce the risk of future complications and aid in weight loss.

## 2023-04-02 NOTE — Assessment & Plan Note (Signed)
Slightly worsened, she has 1+ pitting edema to mid tibia.  No congestive pulmonary symptoms.  She is to take furosemide for 3 days and maintain a diet low in sodium.

## 2023-04-02 NOTE — Assessment & Plan Note (Addendum)
Her HOMA-IR is 10.78 which is markedly elevated. Optimal level < 1.9.   This is complex condition associated with genetics, ectopic fat and lifestyle factors. Insulin resistance may result in increased fat storage, inhibition of the breakdown of fat, cause fluctuations in blood sugar leading to energy crashes and increased cravings for sugary or high carb foods and cause metabolic slowdown making it difficult to lose weight.  This may result in additional weight gain and lead to pre-diabetes and diabetes if untreated. In addition, hyperinsulinemia increases cardiovascular risk, chronic inflammatory response and may increase the risk of obesity related malignancies.  She will be again prescribed Ozempic 1 mg once a week.  She does not have coverage for Wegovy or Zepbound we reviewed patient formulary with her today.  Lab Results  Component Value Date   HGBA1C 6.3 (H) 03/14/2022   Lab Results  Component Value Date   INSULIN 41.6 (H) 03/14/2022   INSULIN 16.1 07/28/2019   INSULIN 7.4 12/08/2018   Lab Results  Component Value Date   GLUCOSE 105 (H) 07/23/2022   GLUCOSE 83 12/03/2017    We reviewed treatment options which include losing 7 to 10% of body weight, increasing volume of physical activity and maintaining a diet low in saturated fats and with a low glycemic load.  Patient has also been educated on the carb insulin model of obesity.  We had tried metformin but she developed a decreased bicarbonate in the presence of topiramate which could sometimes happen.  This increases her risk of lactic acidosis therefore medication was discontinued.  As she is high risk for progression she had been prescribed semaglutide since May of last year have been doing well on the medication losing weight but medication recently denied coverage.

## 2023-04-02 NOTE — Progress Notes (Signed)
Office: [phone]  /  Fax: [phone]  Weight Summary And Biometrics  Vitals Temp: 98.2 F (36.8 C) BP: (!) 150/90 Pulse Rate: 93 SpO2: 100 %   Anthropometric Measurements Height: 5\' 5"  (1.651 m) Weight: (!) 314 lb (142.4 kg) BMI (Calculated): 52.25 Weight at Last Visit: 308 lb Weight Lost Since Last Visit: 0 lb Weight Gained Since Last Visit: 6 lb Starting Weight: 321 lb Total Weight Loss (lbs): 7 lb (3.175 kg) Peak Weight: 321 lb   Body Composition  Body Fat %: 55.2 % Fat Mass (lbs): 173.6 lbs Muscle Mass (lbs): 133.8 lbs Total Body Water (lbs): 104.2 lbs Visceral Fat Rating : 21    No data recorded Today's Visit #: 15  Starting Date: 03/14/22   Subjective   Chief Complaint: Obesity  Jenna [name] is here to discuss her progress with her obesity treatment plan. She is on the the Category 3 Plan and states she is following her eating plan approximately 85 % of the time. She states she is exercising 3-5 minutes 2 times per week.  Weight Progress Since Last Visit:  Since last office visit she has gained 6 pounds. She reports good adherence to reduced calorie nutritional plan. She has been working on not skipping meals, increasing protein intake at every meal, drinking more water, making healthier choices, continues to exercise, reducing portion sizes, and incorporating more whole foods   Patient Reported Barriers to Progress: strong hunger signals and/or impaired satiety / inhibitory control and insurance denying coverage for semaglutide .   Orexigenic Control: Reports problems with appetite and hunger signals.  Reports problems with satiety and satiation.  Denies problems with eating patterns and portion control.  Reports abnormal cravings. Denies feeling deprived or restricted.  She has had an increase in orexigenic signaling due to absence of GLP-1 therapy and she also had run out of topiramate which was prescribed by her neurologist for  migraines.  Patient is on semaglutide as she has prediabetes with significant hyperinsulinemia and developed subclinical acidosis while on metformin.  This was also in the presence of topiramate which increases the risk of this.   Pharmacotherapy for weight management: She is currently taking Topiramate (off label use, single agent) with adequate clinical response  and without side effects. and Ozempic with prediabetes or insulin resistance as the primary indication with adequate clinical response  and no longer on medication .Patient did not respond or had adverse reaction to metformin in the past..   Assessment and Plan   Treatment Plan For Obesity:  Recommended Dietary Goals  Jenna [name] is currently in the action stage of change. As such, her goal is to continue weight management plan. She has agreed to: continue current plan  Behavioral Health and Counseling  We discussed the following behavioral modification strategies today: increasing lean protein intake to established goals, decreasing simple carbohydrates , increasing vegetables, increasing lower glycemic fruits, increasing fiber rich foods, avoiding skipping meals, and increasing water intake .  Additional education and resources provided today: None  Recommended Physical Activity Goals  Jenna [name] has been advised to work up to 150 minutes of moderate intensity aerobic activity a week and strengthening exercises 2-3 times per week for cardiovascular health, weight loss maintenance and preservation of muscle mass.   She has agreed to :  continue to gradually increase the amount and intensity of exercise routine  Pharmacotherapy  We discussed various medication options to help Jenna [name] with her weight loss efforts and we both agreed to : start anti-obesity medication.  In addition to reduced calorie nutrition plan (RCNP), behavioral strategies and physical activity, Jenna [name] would benefit from pharmacotherapy to assist with  hunger signals, satiety and cravings. This will reduce obesity-related health risks by inducing weight loss, and help reduce food consumption and adherence to [facility]) . It may also improve QOL by improving self-confidence and reduce the  setbacks associated with metabolic adaptations.  She also has several high risk obesity related medical conditions including obstructive sleep apnea, prediabetes with hyperinsulinemia and A1c level of 6.3 and now her blood pressure has been going up following recent weight gain.  She is at risk for progression and future complications.  I feel that she would benefit from ongoing GLP-1 therapy with semaglutide for prevention of diabetes (she developed acidosis on metformin) management of her obesity.  After discussion of treatment options, mechanisms of action, benefits, side effects, contraindications and shared decision making she is agreeable to re-starting Ozempic 1 mg once a week. Patient also made aware that medication is indicated for long-term management of obesity and the risk of weight regain following discontinuation of treatment and hence the importance of adhering to medical weight loss plan.    Associated Conditions Impacted by Obesity Treatment  OSA (obstructive sleep apnea) Assessment & Plan: On CPAP with reported good compliance. Continue PAP therapy. Losing 15% or more of body weight may improve AHI.  Patient would also benefit from GLP-1 therapy   Orders: -     Semaglutide (1 MG/DOSE); Inject 1 mg as directed once a week.  Dispense: 3 mL; Refill: 1  Insulin resistance Assessment & Plan: Her HOMA-IR is 10.78 which is markedly elevated. Optimal level < 1.9.   This is complex condition associated with genetics, ectopic fat and lifestyle factors. Insulin resistance may result in increased fat storage, inhibition of the breakdown of fat, cause fluctuations in blood sugar leading to energy crashes and increased cravings for sugary or high carb foods and  cause metabolic slowdown making it difficult to lose weight.  This may result in additional weight gain and lead to pre-diabetes and diabetes if untreated. In addition, hyperinsulinemia increases cardiovascular risk, chronic inflammatory response and may increase the risk of obesity related malignancies.  She will be again prescribed Ozempic 1 mg once a week.  She does not have coverage for Wegovy or Zepbound we reviewed patient formulary with her today.  Lab Results  Component Value Date   HGBA1C 6.3 (H) 03/14/2022   Lab Results  Component Value Date   INSULIN 41.6 (H) 03/14/2022   INSULIN 16.1 07/28/2019   INSULIN 7.4 12/08/2018   Lab Results  Component Value Date   GLUCOSE 105 (H) 07/23/2022   GLUCOSE 83 12/03/2017    We reviewed treatment options which include losing 7 to 10% of body weight, increasing volume of physical activity and maintaining a diet low in saturated fats and with a low glycemic load.  Patient has also been educated on the carb insulin model of obesity.  We had tried metformin but she developed a decreased bicarbonate in the presence of topiramate which could sometimes happen.  This increases her risk of lactic acidosis therefore medication was discontinued.  As she is high risk for progression she had been prescribed semaglutide since May of last year have been doing well on the medication losing weight but medication recently denied coverage.   Orders: -     Semaglutide (1 MG/DOSE); Inject 1 mg as directed once a week.  Dispense: 3 mL; Refill: 1  Class  3 severe obesity without serious comorbidity with body mass index (BMI) of 50.0 to 59.9 in adult, unspecified obesity type [facility]) Assessment & Plan: Jenna [name] has been with [place] since January 2024 she has had fluctuations in her weight loss with the most recent weight gain of 6 pounds after discontinuation of semaglutide and topiramate because of insurance denial and running out of medication respectively.  She has  strong orexigenic signaling and has high risk underlying comorbidities which make weight loss very difficult for her.  She benefits from pharmacotherapy.  Under the circumstances GLP-1 as medication will help reduce the risk of future complications and aid in weight loss.  Orders: -     Semaglutide (1 MG/DOSE); Inject 1 mg as directed once a week.  Dispense: 3 mL; Refill: 1  Prediabetes Assessment & Plan: She has had a progressive increase of hemoglobin A1c now at 6.3 with very high insulin levels.  She had been on metformin in the past but medication was discontinued because of increase in bicarbonate while also taking topiramate.  She had been doing well on semaglutide but medication was recently denied by her insurance.  We will resubmit prescription again and follow through with prior authorization.  Orders: -     Semaglutide (1 MG/DOSE); Inject 1 mg as directed once a week.  Dispense: 3 mL; Refill: 1  Elevated blood pressure reading without diagnosis of hypertension Assessment & Plan: Repeat blood pressure was again elevated.  Patient has been under significant stress due to her weight gain and loss of coverage for GLP-1.  We discussed the importance of monitoring.  She also appears to be edematous but denies indiscretion with sodium intake.  She will start taking furosemide daily for 3 days.  Patient advised to monitor blood pressure at home if her trends are upward with her weight gain she will be started on antihypertensives.  These can always be reduced as she loses weight.   Bilateral leg edema Assessment & Plan: Slightly worsened, she has 1+ pitting edema to mid tibia.  No congestive pulmonary symptoms.  She is to take furosemide for 3 days and maintain a diet low in sodium.  Orders: -     Furosemide; Take 1 tablet (20 mg total) by mouth as needed for edema or fluid.  Dispense: 30 tablet; Refill: 0  Abnormal food appetite Assessment & Plan: She has increased orexigenic  signaling, impaired satiety and inhibitory control. This is secondary to an abnormal energy regulation system and pathological neurohormonal pathways characteristic of excess adiposity.  In addition to nutritional and behavioral strategies she benefits from pharmacotherapy.  This had improved on GLP-1 but medication is no longer covered.       Objective   Physical Exam:  Blood pressure (!) 150/90, pulse 93, temperature 98.2 F (36.8 C), height 5\' 5"  (1.651 m), weight (!) 314 lb (142.4 kg), SpO2 100%. Body mass index is 52.25 kg/m.  General: She is overweight, cooperative, alert, well developed, and in no acute distress. PSYCH: Has normal mood, affect and thought process.   HEENT: EOMI, sclerae are anicteric. Lungs: Normal breathing effort, no conversational dyspnea. Extremities: No edema.  Neurologic: No gross sensory or motor deficits. No tremors or fasciculations noted.    Diagnostic Data Reviewed:  BMET    Component Value Date/Time   NA 140 07/23/2022 1213   K 4.7 07/23/2022 1213   CL 108 (H) 07/23/2022 1213   CO2 19 (L) 07/23/2022 1213   GLUCOSE 105 (H) 07/23/2022 1213  GLUCOSE 118 (H) 12/20/2021 1430   BUN 8 07/23/2022 1213   CREATININE 0.89 07/23/2022 1213   CALCIUM 9.0 07/23/2022 1213   GFRNONAA 55 (L) 12/20/2021 1430   GFRAA >60 09/25/2019 1548   Lab Results  Component Value Date   HGBA1C 6.3 (H) 03/14/2022   HGBA1C 5.6 12/03/2017   Lab Results  Component Value Date   INSULIN 41.6 (H) 03/14/2022   INSULIN 7.4 12/08/2018   Lab Results  Component Value Date   TSH 1.655 12/20/2021   CBC    Component Value Date/Time   WBC 8.1 12/20/2021 1430   RBC 4.57 12/20/2021 1430   HGB 12.5 12/20/2021 1430   HGB 12.5 01/23/2021 1525   HCT 38.9 12/20/2021 1430   HCT 37.4 01/23/2021 1525   PLT 333 12/20/2021 1430   PLT 284 01/23/2021 1525   MCV 85.1 12/20/2021 1430   MCV 88 01/23/2021 1525   MCH 27.4 12/20/2021 1430   MCHC 32.1 12/20/2021 1430   RDW 14.6  12/20/2021 1430   RDW 15.1 01/23/2021 1525   Iron Studies    Component Value Date/Time   IRON 53 04/02/2019 1510   TIBC 360 04/02/2019 1510   FERRITIN 26 04/02/2019 1510   IRONPCTSAT 15 04/02/2019 1510   Lipid Panel     Component Value Date/Time   CHOL 140 03/14/2022 1030   TRIG 68 03/14/2022 1030   HDL 46 03/14/2022 1030   CHOLHDL 3.8 10/06/2020 1458   LDLCALC 80 03/14/2022 1030   Hepatic Function Panel     Component Value Date/Time   PROT 6.8 07/23/2022 1213   ALBUMIN 3.9 07/23/2022 1213   AST 24 07/23/2022 1213   ALT 28 07/23/2022 1213   ALKPHOS 81 07/23/2022 1213   BILITOT 0.4 07/23/2022 1213      Component Value Date/Time   TSH 1.655 12/20/2021 1430   TSH 1.430 01/23/2021 1525   Nutritional Lab Results  Component Value Date   VD25OH 28.3 (L) 07/23/2022   VD25OH 14.6 (L) 03/14/2022   VD25OH 14.8 (L) 01/23/2021    Follow-Up   Return in about 2 weeks (around 04/16/2023).[name] She was informed of the importance of frequent follow up visits to maximize her success with intensive lifestyle modifications for her multiple health conditions.  Attestation Statement   Reviewed by clinician on day of visit: allergies, medications, problem list, medical history, surgical history, family history, social history, and previous encounter notes.   I have spent 40 minutes in the care of the patient today including: preparing to see patient (e.g. review and interpretation of tests, old notes ), obtaining and/or reviewing separately obtained history, performing a medically appropriate examination or evaluation, counseling and educating the patient, ordering medications, test or procedures, documenting clinical information in the electronic or other health care record, and independently interpreting results and communicating results to the patient, family, or caregiver   Worthy Rancher, MD

## 2023-04-02 NOTE — Assessment & Plan Note (Signed)
On CPAP with reported good compliance. Continue PAP therapy. Losing 15% or more of body weight may improve AHI.  Patient would also benefit from GLP-1 therapy

## 2023-04-02 NOTE — Assessment & Plan Note (Signed)
Repeat blood pressure was again elevated.  Patient has been under significant stress due to her weight gain and loss of coverage for GLP-1.  We discussed the importance of monitoring.  She also appears to be edematous but denies indiscretion with sodium intake.  She will start taking furosemide daily for 3 days.  Patient advised to monitor blood pressure at home if her trends are upward with her weight gain she will be started on antihypertensives.  These can always be reduced as she loses weight.

## 2023-04-02 NOTE — Assessment & Plan Note (Signed)
She has increased orexigenic signaling, impaired satiety and inhibitory control. This is secondary to an abnormal energy regulation system and pathological neurohormonal pathways characteristic of excess adiposity.  In addition to nutritional and behavioral strategies she benefits from pharmacotherapy.  This had improved on GLP-1 but medication is no longer covered.

## 2023-04-04 ENCOUNTER — Ambulatory Visit: Payer: 59 | Attending: Internal Medicine | Admitting: Internal Medicine

## 2023-04-04 DIAGNOSIS — G4719 Other hypersomnia: Secondary | ICD-10-CM | POA: Diagnosis not present

## 2023-04-04 DIAGNOSIS — R0683 Snoring: Secondary | ICD-10-CM

## 2023-04-04 DIAGNOSIS — Z2821 Immunization not carried out because of patient refusal: Secondary | ICD-10-CM

## 2023-04-04 DIAGNOSIS — Z6841 Body Mass Index (BMI) 40.0 and over, adult: Secondary | ICD-10-CM

## 2023-04-04 DIAGNOSIS — Z7985 Long-term (current) use of injectable non-insulin antidiabetic drugs: Secondary | ICD-10-CM

## 2023-04-04 DIAGNOSIS — R7303 Prediabetes: Secondary | ICD-10-CM | POA: Diagnosis not present

## 2023-04-04 DIAGNOSIS — E66813 Obesity, class 3: Secondary | ICD-10-CM

## 2023-04-04 LAB — GLUCOSE, POCT (MANUAL RESULT ENTRY): POC Glucose: 88 mg/dL (ref 70–99)

## 2023-04-04 LAB — POCT GLYCOSYLATED HEMOGLOBIN (HGB A1C): HbA1c, POC (prediabetic range): 5.8 % (ref 5.7–6.4)

## 2023-04-04 NOTE — Patient Instructions (Addendum)
We have ordered a home sleep study.  This has been sent to [facility].  Once they get approval from your insurance, they will contact you and send you the sleep kit in the mail.  If you have not heard from the sleep lab in about 2 weeks, please call us back and let us know so that we can check on the status of it.

## 2023-04-04 NOTE — Progress Notes (Signed)
Patient ID: Jenna [name], female    DOB: 25-Aug-1970  MRN: [id number]  CC: Prediabetes (Prediabetes f/u. Med refill. /Discuss sleep study - Advised by Dr.Maldonado to have another sleep study due to affecting weight loss/No to Tdap vax.)   Subjective: Jenna [name] is a 53 y.o. female who presents for chronic ds management. Her concerns today include:  Patient with history of morbid obesity on Wegovy through [facility], preDM, OA of the knees, chronic lower back pain.  Depression (on Cymbalta for LBP and mood), vitamin D deficiency   Discussed the use of AI scribe software for clinical note transcription with the patient, who gave verbal consent to proceed.  History of Present Illness    Obesity/PreDM:  she is currently enrolled in a medical weight management program and has beeen taking Ozempic since June of last year. The patient reports that the medication has been effective in decreasing her appetite. However, due to an issue with insurance coverage related to a doctor's note, she was out of the med for three weeks, during which they gained six pounds. She has since resumed the medication at dose of 1 mg Q wk. she is also on Topamax through her neurologist for headaches and dizziness.  She finds that this also helps in decreasing her appetite.  In terms of physical activity, she has been doing chair exercises twice a week doing a 4-minute video.  She is requesting referral for sleep study based on the recommendation of her weight management provider.  She had a sleep study done in April 2023.  Showed loud snoring with an AHI of 4 and limb disorder movement.  We counseled her about good sleep hygiene and started her on Requip.  She found the Requip helpful.  She takes it about every other night because she is on several other medicines that make her very drowsy. Since her last sleep study, she reports loud snoring has persisted so much so that her significant other sometimes has to sleep in  another room.  She endorses morning headaches and daytime sleepiness.  She wakes up in the mornings with mouth and throat dry.  She sleeps on 4 pillows.  HM: She declines Tdap vaccine    Patient Active Problem List   Diagnosis Date Noted   Elevated blood pressure reading without diagnosis of hypertension 04/02/2023   Abnormal food appetite 04/02/2023   OSA (obstructive sleep apnea) 11/08/2022   Bilateral leg edema 07/23/2022   Benign neoplasm of ascending colon 07/19/2022   Acute kidney injury (HCC) 03/28/2022   Prediabetes 03/28/2022   Other spondylosis with radiculopathy, cervical region 10/03/2021   Fecal occult blood test positive 07/15/2021   Periodic limb movement 07/11/2021   Loud snoring 01/23/2021   Severe obesity (BMI >= 40) (HCC) 02/08/2020   Insulin resistance 07/28/2019   Depression 07/28/2019   Vitamin D deficiency 12/09/2018   Class 3 severe obesity without serious comorbidity with body mass index (BMI) of 50.0 to 59.9 in adult (HCC) 01/17/2017   Low back pain 05/17/2016   Unilateral primary osteoarthritis, left knee 05/08/2016   Primary osteoarthritis of both knees 06/27/2015   Closed right fibular fracture 08/10/2014     Current Outpatient Medications on File Prior to Visit  Medication Sig Dispense Refill   DULoxetine (CYMBALTA) 60 MG capsule Take 1 capsule (60 mg total) by mouth daily for back pain and mood.Must have office visit for refills 30 capsule 1   ferrous sulfate 325 (65 FE) MG tablet Take 325 mg  by mouth every Wednesday.     furosemide (LASIX) 20 MG tablet Take 1 tablet (20 mg total) by mouth as needed for edema or fluid. 30 tablet 0   gabapentin (NEURONTIN) 300 MG capsule Take 1 capsule (300 mg total) by mouth 3 (three) times daily as needed. (Patient taking differently: Take 300 mg by mouth daily as needed.) 60 capsule 1   hydrOXYzine (ATARAX) 25 MG tablet TAKE 1 TO 2 TABLETS BY MOUTH EVERY 6 TO 8 HOURS AS NEEDED FOR ITCHING 30 tablet 0   naproxen  (NAPROSYN) 500 MG tablet Take 1 tablet (500 mg total) by mouth 2 (two) times daily with a meal as needed for pain.     ondansetron (ZOFRAN) 4 MG tablet Take 1 tablet (4 mg total) by mouth every 8 (eight) hours as needed for nausea or vomiting. 30 tablet 0   rOPINIRole (REQUIP) 0.5 MG tablet Take 1 tablet (0.5 mg total) by mouth at bedtime. 30 tablet 1   Semaglutide, 1 MG/DOSE, 4 MG/3ML SOPN Inject 1 mg as directed once a week. 3 mL 1   tiZANidine (ZANAFLEX) 4 MG tablet Take 1 tablet (4 mg total) by mouth every 8 (eight) hours as needed for muscle spasms. 60 tablet 0   topiramate (TOPAMAX) 25 MG tablet Take 3 tablets (75 mg total) by mouth at bedtime. 90 tablet 2   Vitamin D, Ergocalciferol, (DRISDOL) 1.25 MG (50000 UNIT) CAPS capsule Take 1 capsule (50,000 Units total) by mouth every 7 (seven) days. 12 capsule 1   No current facility-administered medications on file prior to visit.    Allergies  Allergen Reactions   Diflucan [Fluconazole] Hives   Metformin And Related Other (See Comments)    Decreased bicarbonate    Social History   Socioeconomic History   Marital status: Single    Spouse name: Not on file   Number of children: Not on file   Years of education: Not on file   Highest education level: GED or equivalent  Occupational History   Occupation: Stay at home, disabled  Tobacco Use   Smoking status: Former    Current packs/day: 0.00    Types: Cigarettes    Quit date: 10/10/2021    Years since quitting: 1.4   Smokeless tobacco: Never  Vaping Use   Vaping status: Never Used  Substance and Sexual Activity   Alcohol use: No    Alcohol/week: 0.0 standard drinks of alcohol   Drug use: No   Sexual activity: Not on file  Other Topics Concern   Not on file  Social History Narrative   Not on file   Social Drivers of Health   Financial Resource Strain: Low Risk  (04/04/2023)   Overall Financial Resource Strain (CARDIA)    Difficulty of Paying Living Expenses: Not hard at  all  Food Insecurity: No Food Insecurity (04/04/2023)   Hunger Vital Sign    Worried About Running Out of Food in the Last Year: Never true    Ran Out of Food in the Last Year: Never true  Transportation Needs: No Transportation Needs (04/04/2023)   PRAPARE - Administrator, Civil Service (Medical): No    Lack of Transportation (Non-Medical): No  Physical Activity: Inactive (04/04/2023)   Exercise Vital Sign    Days of Exercise per Week: 0 days    Minutes of Exercise per Session: 10 min  Stress: Stress Concern Present (04/04/2023)   [employer] of Occupational Health - Occupational Stress Questionnaire  Feeling of Stress : To some extent  Social Connections: Unknown (04/04/2023)   Social Connection and Isolation Panel [NHANES]    Frequency of Communication with Friends and Family: Once a week    Frequency of Social Gatherings with Friends and Family: Once a week    Attends Religious Services: 1 to 4 times per year    Active Member of [employer] or Organizations: No    Attends Banker Meetings: Never    Marital Status: Patient declined  Intimate Partner Violence: Unknown (06/14/2021)   Received from [employer], [facility]   HITS    Physically Hurt: Not on file    Insult or Talk Down To: Not on file    Threaten Physical Harm: Not on file    Scream or Curse: Not on file    Family History  Problem Relation Age of Onset   Colon polyps Mother    Diabetes Mother    Hypertension Mother    Hypertension Father    Breast cancer Neg Hx    Colon cancer Neg Hx    Esophageal cancer Neg Hx    Rectal cancer Neg Hx    Stomach cancer Neg Hx     Past Surgical History:  Procedure Laterality Date   CHOLECYSTECTOMY     COLONOSCOPY WITH PROPOFOL N/A 07/19/2022   Procedure: COLONOSCOPY WITH PROPOFOL;  Surgeon: Jenel Lucks, MD;  Location: [facility] ENDOSCOPY;  Service: Gastroenterology;  Laterality: N/A;   COLONOSCOPY WITH PROPOFOL N/A 10/25/2022   Procedure:  COLONOSCOPY WITH PROPOFOL;  Surgeon: Meridee Score Netty Starring., MD;  Location: [facility] ENDOSCOPY;  Service: Gastroenterology;  Laterality: N/A;   ENDOSCOPIC MUCOSAL RESECTION N/A 10/25/2022   Procedure: ENDOSCOPIC MUCOSAL RESECTION;  Surgeon: Meridee Score Netty Starring., MD;  Location: [facility] ENDOSCOPY;  Service: Gastroenterology;  Laterality: N/A;   HEMOSTASIS CLIP PLACEMENT  10/25/2022   Procedure: HEMOSTASIS CLIP PLACEMENT;  Surgeon: Lemar Lofty., MD;  Location: [facility] ENDOSCOPY;  Service: Gastroenterology;;   HOT HEMOSTASIS N/A 10/25/2022   Procedure: HOT HEMOSTASIS (ARGON PLASMA COAGULATION/BICAP);  Surgeon: Lemar Lofty., MD;  Location: [facility];  Service: Gastroenterology;  Laterality: N/A;   POLYPECTOMY  07/19/2022   Procedure: POLYPECTOMY;  Surgeon: Jenel Lucks, MD;  Location: [facility] ENDOSCOPY;  Service: Gastroenterology;;   SUBMUCOSAL LIFTING INJECTION  07/19/2022   Procedure: SUBMUCOSAL LIFTING INJECTION;  Surgeon: Jenel Lucks, MD;  Location: [facility] ENDOSCOPY;  Service: Gastroenterology;;   Sunnie Nielsen LIFTING INJECTION  10/25/2022   Procedure: SUBMUCOSAL LIFTING INJECTION;  Surgeon: Lemar Lofty., MD;  Location: [facility] ENDOSCOPY;  Service: Gastroenterology;;   SUBMUCOSAL TATTOO INJECTION  10/25/2022   Procedure: SUBMUCOSAL TATTOO INJECTION;  Surgeon: Lemar Lofty., MD;  Location: [facility] ENDOSCOPY;  Service: Gastroenterology;;   TUBAL LIGATION      ROS: Review of Systems Negative except as stated above  PHYSICAL EXAM: BP 123/74 (BP Location: Left Arm, Patient Position: Sitting, Cuff Size: Normal)   Pulse 82   Temp 98.2 F (36.8 C) (Oral)   Ht 5\' 5"  (1.651 m)   Wt (!) 318 lb (144.2 kg)   SpO2 99%   BMI 52.92 kg/m   Physical Exam  General appearance - alert, well appearing, and in no distress Mental status - normal mood, behavior, speech, dress, motor activity, and thought processes Chest - clear to auscultation, no wheezes, rales or rhonchi, symmetric air  entry Heart - normal rate, regular rhythm, normal S1, S2, no murmurs, rubs, clicks or gallops Extremities - peripheral pulses normal, no pedal edema, no  clubbing or cyanosis  Results for orders placed or performed in visit on 04/04/23  POCT glucose (manual entry)   Collection Time: 04/04/23 10:49 AM  Result Value Ref Range   POC Glucose 88 70 - 99 mg/dl  POCT glycosylated hemoglobin (Hb A1C)   Collection Time: 04/04/23 11:00 AM  Result Value Ref Range   Hemoglobin A1C     HbA1c POC (<> result, manual entry)     HbA1c, POC (prediabetic range) 5.8 5.7 - 6.4 %   HbA1c, POC (controlled diabetic range)         Latest Ref Rng & Units 07/23/2022   12:13 PM 03/14/2022   10:30 AM 12/20/2021    2:30 PM  CMP  Glucose 70 - 99 mg/dL 952  841  324   BUN 6 - 24 mg/dL 8  10  10    Creatinine 0.57 - 1.00 mg/dL 4.01  0.27  2.53   Sodium 134 - 144 mmol/L 140  139  140   Potassium 3.5 - 5.2 mmol/L 4.7  4.7  4.0   Chloride 96 - 106 mmol/L 108  106  109   CO2 20 - 29 mmol/L 19  20  21    Calcium 8.7 - 10.2 mg/dL 9.0  9.6  9.8   Total Protein 6.0 - 8.5 g/dL 6.8  7.0  7.9   Total Bilirubin 0.0 - 1.2 mg/dL 0.4  0.4  0.5   Alkaline Phos 44 - 121 IU/L 81  78  72   AST 0 - 40 IU/L 24  17  28    ALT 0 - 32 IU/L 28  17  24     Lipid Panel     Component Value Date/Time   CHOL 140 03/14/2022 1030   TRIG 68 03/14/2022 1030   HDL 46 03/14/2022 1030   CHOLHDL 3.8 10/06/2020 1458   LDLCALC 80 03/14/2022 1030    CBC    Component Value Date/Time   WBC 8.1 12/20/2021 1430   RBC 4.57 12/20/2021 1430   HGB 12.5 12/20/2021 1430   HGB 12.5 01/23/2021 1525   HCT 38.9 12/20/2021 1430   HCT 37.4 01/23/2021 1525   PLT 333 12/20/2021 1430   PLT 284 01/23/2021 1525   MCV 85.1 12/20/2021 1430   MCV 88 01/23/2021 1525   MCH 27.4 12/20/2021 1430   MCHC 32.1 12/20/2021 1430   RDW 14.6 12/20/2021 1430   RDW 15.1 01/23/2021 1525   LYMPHSABS 3.8 12/20/2021 1430   LYMPHSABS 2.7 10/06/2020 1458   MONOABS 0.6  12/20/2021 1430   EOSABS 0.2 12/20/2021 1430   EOSABS 0.3 10/06/2020 1458   BASOSABS 0.1 12/20/2021 1430   BASOSABS 0.0 10/06/2020 1458    ASSESSMENT AND PLAN: 1. Morbid obesity (HCC) (Primary) 2. Prediabetes Encouraged healthy eating habits and smaller portions.  She feels the Ozempic and Topamax helped to decrease her appetite.  Encouraged her to try to move more.  I suggest increasing her chair exercises to several times a day and more than twice a week.  She is agreeable to trying this.  She will continue follow-up with medical weight management. - POCT glucose (manual entry) - POCT glycosylated hemoglobin (Hb A1C)  3. Loud snoring Will put her in for a home sleep study as she may have progressed to sleep apnea based on her symptoms.  Further management will be based on results. - Home sleep test  4. Excessive daytime sleepiness See #3 above - Home sleep test  5. Tetanus, diphtheria, and acellular  pertussis (Tdap) vaccination declined Commended.  Patient declined.     Patient was given the opportunity to ask questions.  Patient verbalized understanding of the plan and was able to repeat key elements of the plan.   This documentation was completed using Paediatric nurse.  Any transcriptional errors are unintentional.  Orders Placed This Encounter  Procedures   POCT glucose (manual entry)   POCT glycosylated hemoglobin (Hb A1C)   Home sleep test     Requested Prescriptions    No prescriptions requested or ordered in this encounter    Return in about 5 months (around 09/02/2023).  Jonah Blue, MD, FACP

## 2023-04-09 ENCOUNTER — Encounter: Payer: Self-pay | Admitting: Internal Medicine

## 2023-04-10 ENCOUNTER — Other Ambulatory Visit: Payer: Self-pay | Admitting: Internal Medicine

## 2023-04-10 DIAGNOSIS — M545 Low back pain, unspecified: Secondary | ICD-10-CM

## 2023-04-10 MED ORDER — NAPROXEN 500 MG PO TABS: 500.0000 mg | ORAL_TABLET | Freq: Two times a day (BID) | ORAL | 0 refills | Status: DC | PRN

## 2023-04-14 ENCOUNTER — Other Ambulatory Visit: Payer: Self-pay | Admitting: Internal Medicine

## 2023-04-14 DIAGNOSIS — G4761 Periodic limb movement disorder: Secondary | ICD-10-CM

## 2023-04-15 ENCOUNTER — Other Ambulatory Visit: Payer: Self-pay

## 2023-04-15 MED ORDER — ROPINIROLE HCL 0.5 MG PO TABS
0.5000 mg | ORAL_TABLET | Freq: Every day | ORAL | 1 refills | Status: DC
  Filled 2023-04-15: qty 90, 90d supply, fill #0

## 2023-04-18 ENCOUNTER — Other Ambulatory Visit: Payer: Self-pay | Admitting: Internal Medicine

## 2023-04-18 DIAGNOSIS — M545 Low back pain, unspecified: Secondary | ICD-10-CM

## 2023-04-25 ENCOUNTER — Other Ambulatory Visit: Payer: Self-pay

## 2023-04-26 ENCOUNTER — Other Ambulatory Visit: Payer: Self-pay

## 2023-04-29 ENCOUNTER — Encounter (INDEPENDENT_AMBULATORY_CARE_PROVIDER_SITE_OTHER): Payer: Self-pay | Admitting: Internal Medicine

## 2023-04-29 ENCOUNTER — Other Ambulatory Visit: Payer: Self-pay

## 2023-04-29 ENCOUNTER — Ambulatory Visit (INDEPENDENT_AMBULATORY_CARE_PROVIDER_SITE_OTHER): Payer: 59 | Admitting: Internal Medicine

## 2023-04-29 VITALS — BP 139/85 | HR 97 | Temp 98.3°F | Ht 65.0 in | Wt 309.0 lb

## 2023-04-29 DIAGNOSIS — Z6841 Body Mass Index (BMI) 40.0 and over, adult: Secondary | ICD-10-CM

## 2023-04-29 DIAGNOSIS — R7303 Prediabetes: Secondary | ICD-10-CM

## 2023-04-29 DIAGNOSIS — E88819 Insulin resistance, unspecified: Secondary | ICD-10-CM | POA: Diagnosis not present

## 2023-04-29 DIAGNOSIS — G4733 Obstructive sleep apnea (adult) (pediatric): Secondary | ICD-10-CM

## 2023-04-29 DIAGNOSIS — R638 Other symptoms and signs concerning food and fluid intake: Secondary | ICD-10-CM

## 2023-04-29 DIAGNOSIS — E66813 Obesity, class 3: Secondary | ICD-10-CM

## 2023-04-29 MED ORDER — SEMAGLUTIDE (1 MG/DOSE) 4 MG/3ML ~~LOC~~ SOPN
1.0000 mg | PEN_INJECTOR | SUBCUTANEOUS | 1 refills | Status: DC
  Filled 2023-04-29: qty 3, 28d supply, fill #0

## 2023-04-29 NOTE — Assessment & Plan Note (Signed)
Improved on GLP1 . She has increased orexigenic signaling, impaired satiety and inhibitory control. This is secondary to an abnormal energy regulation system and pathological neurohormonal pathways characteristic of excess adiposity.  In addition to nutritional and behavioral strategies she benefits from pharmacotherapy.  This had improved on GLP-1 but medication is no longer covered.

## 2023-04-29 NOTE — Assessment & Plan Note (Signed)
She has had a progressive increase of hemoglobin A1c now at 6.3 with very high insulin levels.  She had been on metformin in the past but medication was discontinued because of increase in bicarbonate while also taking topiramate.  Patient is now on semaglutide 1 mg once a week with adequate clinical response.  She will continue medication for pharmacoprophylaxis.

## 2023-04-29 NOTE — Assessment & Plan Note (Signed)
Her HOMA-IR is 10.78 which is markedly elevated. Optimal level < 1.9.   This is complex condition associated with genetics, ectopic fat and lifestyle factors. Insulin resistance may result in increased fat storage, inhibition of the breakdown of fat, cause fluctuations in blood sugar leading to energy crashes and increased cravings for sugary or high carb foods and cause metabolic slowdown making it difficult to lose weight.  This may result in additional weight gain and lead to pre-diabetes and diabetes if untreated. In addition, hyperinsulinemia increases cardiovascular risk, chronic inflammatory response and may increase the risk of obesity related malignancies.  She will be again prescribed Ozempic 1 mg once a week.  She does not have coverage for Wegovy or Zepbound we reviewed patient formulary with her today.  Lab Results  Component Value Date   HGBA1C 5.8 04/04/2023   Lab Results  Component Value Date   INSULIN 41.6 (H) 03/14/2022   INSULIN 16.1 07/28/2019   INSULIN 7.4 12/08/2018   Lab Results  Component Value Date   GLUCOSE 105 (H) 07/23/2022   GLUCOSE 83 12/03/2017    She is currently on Ozempic 1 mg once a week.  Continue current dose

## 2023-04-29 NOTE — Assessment & Plan Note (Signed)
-   Refer to obesity management plan - Continue with current weight management strategy. - Discussed adjustments to overcome identified barriers. - Reinforce dietary and exercise goals.

## 2023-04-29 NOTE — Assessment & Plan Note (Addendum)
Had a sleep study but never started CPAP. Undergoing repeat sleep for unknown reasons.  I encouraged her to reach out to sleep specialist to read study to see if she could obtain results.

## 2023-04-29 NOTE — Progress Notes (Signed)
Office: [phone]  /  Fax: [phone]  Weight Summary And Biometrics  Vitals Temp: 98.3 F (36.8 C) BP: 139/85 Pulse Rate: 97 SpO2: 98 %   Anthropometric Measurements Height: 5\' 5"  (1.651 m) Weight: (!) 309 lb (140.2 kg) BMI (Calculated): 51.42 Weight at Last Visit: 314 lb Weight Lost Since Last Visit: 5 lb Weight Gained Since Last Visit: 0 Starting Weight: 321 lb Total Weight Loss (lbs): 12 lb (5.443 kg) Peak Weight: 321 lb   Body Composition  Body Fat %: 54.5 % Fat Mass (lbs): 168.8 lbs Muscle Mass (lbs): 133.8 lbs Total Body Water (lbs): 101.8 lbs Visceral Fat Rating : 21    No data recorded No data recorded No data recorded  Subjective   Chief Complaint: Obesity  Avi is here to discuss her progress with her obesity treatment plan. She is on the the Category 3 Plan and states she is following her eating plan approximately 90 % of the time. She states she is exercising 15 minutes 2-3 times per week, currently doing chair exercises.  Interval History:   Since last office visit she has lost 5 pounds. She reports good adherence to reduced calorie nutritional plan. She has been working on reading food labels, increasing protein intake at every meal, drinking more water, making healthier choices, reducing portion sizes, and incorporating more whole foods  Orexigenic Control:  Denies problems with appetite and hunger signals.  Denies problems with satiety and satiation.  Denies problems with eating patterns and portion control.  Denies abnormal cravings. Denies feeling deprived or restricted.   Barriers identified: low volume of physical activity at present .   Pharmacotherapy for weight loss: She is currently taking Ozempic with prediabetes or insulin resistance as the primary indication with adequate clinical response  and without side effects..Patient did not respond or had adverse reaction to metformin in the past..   Assessment and Plan    Treatment Plan For Obesity:  Recommended Dietary Goals  Kyndall is currently in the action stage of change. As such, her goal is to continue weight management plan. She has agreed to: continue current plan  Behavioral Intervention  We discussed the following Behavioral Modification Strategies today: continue to work on maintaining a reduced calorie state, getting the recommended amount of protein, incorporating whole foods, making healthy choices, staying well hydrated and practicing mindfulness when eating..  Additional resources provided today: None  Recommended Physical Activity Goals  Antonetta has been advised to work up to 150 minutes of moderate intensity aerobic activity a week and strengthening exercises 2-3 times per week for cardiovascular health, weight loss maintenance and preservation of muscle mass.   She has agreed to :  Think about enjoyable ways to increase daily physical activity and overcoming barriers to exercise and Increase physical activity in their day and reduce sedentary time (increase NEAT).  Pharmacotherapy  We discussed various medication options to help Jaimi with her weight loss efforts and we both agreed to : adequate clinical response to current dose, continue current regimen  Associated Conditions Addressed Today  Insulin resistance Assessment & Plan: Her HOMA-IR is 10.78 which is markedly elevated. Optimal level < 1.9.   This is complex condition associated with genetics, ectopic fat and lifestyle factors. Insulin resistance may result in increased fat storage, inhibition of the breakdown of fat, cause fluctuations in blood sugar leading to energy crashes and increased cravings for sugary or high carb foods and cause metabolic slowdown making it difficult to lose weight.  This may  result in additional weight gain and lead to pre-diabetes and diabetes if untreated. In addition, hyperinsulinemia increases cardiovascular risk, chronic  inflammatory response and may increase the risk of obesity related malignancies.  She will be again prescribed Ozempic 1 mg once a week.  She does not have coverage for Wegovy or Zepbound we reviewed patient formulary with her today.  Lab Results  Component Value Date   HGBA1C 5.8 04/04/2023   Lab Results  Component Value Date   INSULIN 41.6 (H) 03/14/2022   INSULIN 16.1 07/28/2019   INSULIN 7.4 12/08/2018   Lab Results  Component Value Date   GLUCOSE 105 (H) 07/23/2022   GLUCOSE 83 12/03/2017    She is currently on Ozempic 1 mg once a week.  Continue current dose   Orders: -     Semaglutide (1 MG/DOSE); Inject 1 mg as directed once a week.  Dispense: 3 mL; Refill: 1  OSA (obstructive sleep apnea) Assessment & Plan: Had a sleep study but never started CPAP. Undergoing repeat sleep for unknown reasons.  I encouraged her to reach out to sleep specialist to read study to see if she could obtain results.   Orders: -     Semaglutide (1 MG/DOSE); Inject 1 mg as directed once a week.  Dispense: 3 mL; Refill: 1  Class 3 severe obesity without serious comorbidity with body mass index (BMI) of 50.0 to 59.9 in adult, unspecified obesity type [facility]) Assessment & Plan: - Refer to obesity management plan - Continue with current weight management strategy. - Discussed adjustments to overcome identified barriers. - Reinforce dietary and exercise goals.   Orders: -     Semaglutide (1 MG/DOSE); Inject 1 mg as directed once a week.  Dispense: 3 mL; Refill: 1  Abnormal food appetite Assessment & Plan: Improved on GLP1 . She has increased orexigenic signaling, impaired satiety and inhibitory control. This is secondary to an abnormal energy regulation system and pathological neurohormonal pathways characteristic of excess adiposity.  In addition to nutritional and behavioral strategies she benefits from pharmacotherapy.  This had improved on GLP-1 but medication is no longer  covered.    Prediabetes Assessment & Plan: She has had a progressive increase of hemoglobin A1c now at 6.3 with very high insulin levels.  She had been on metformin in the past but medication was discontinued because of increase in bicarbonate while also taking topiramate.  Patient is now on semaglutide 1 mg once a week with adequate clinical response.  She will continue medication for pharmacoprophylaxis.  Orders: -     Semaglutide (1 MG/DOSE); Inject 1 mg as directed once a week.  Dispense: 3 mL; Refill: 1    Objective   Physical Exam:  Blood pressure 139/85, pulse 97, temperature 98.3 F (36.8 C), height 5\' 5"  (1.651 m), weight (!) 309 lb (140.2 kg), SpO2 98%. Body mass index is 51.42 kg/m.  General: She is overweight, cooperative, alert, well developed, and in no acute distress. PSYCH: Has normal mood, affect and thought process.   HEENT: EOMI, sclerae are anicteric. Lungs: Normal breathing effort, no conversational dyspnea. Extremities: No edema.  Neurologic: No gross sensory or motor deficits. No tremors or fasciculations noted.    Diagnostic Data Reviewed:  BMET    Component Value Date/Time   NA 140 07/23/2022 1213   K 4.7 07/23/2022 1213   CL 108 (H) 07/23/2022 1213   CO2 19 (L) 07/23/2022 1213   GLUCOSE 105 (H) 07/23/2022 1213   GLUCOSE 118 (H) 12/20/2021  1430   BUN 8 07/23/2022 1213   CREATININE 0.89 07/23/2022 1213   CALCIUM 9.0 07/23/2022 1213   GFRNONAA 55 (L) 12/20/2021 1430   GFRAA >60 09/25/2019 1548   Lab Results  Component Value Date   HGBA1C 5.8 04/04/2023   HGBA1C 5.6 12/03/2017   Lab Results  Component Value Date   INSULIN 41.6 (H) 03/14/2022   INSULIN 7.4 12/08/2018   Lab Results  Component Value Date   TSH 1.655 12/20/2021   CBC    Component Value Date/Time   WBC 8.1 12/20/2021 1430   RBC 4.57 12/20/2021 1430   HGB 12.5 12/20/2021 1430   HGB 12.5 01/23/2021 1525   HCT 38.9 12/20/2021 1430   HCT 37.4 01/23/2021 1525   PLT  333 12/20/2021 1430   PLT 284 01/23/2021 1525   MCV 85.1 12/20/2021 1430   MCV 88 01/23/2021 1525   MCH 27.4 12/20/2021 1430   MCHC 32.1 12/20/2021 1430   RDW 14.6 12/20/2021 1430   RDW 15.1 01/23/2021 1525   Iron Studies    Component Value Date/Time   IRON 53 04/02/2019 1510   TIBC 360 04/02/2019 1510   FERRITIN 26 04/02/2019 1510   IRONPCTSAT 15 04/02/2019 1510   Lipid Panel     Component Value Date/Time   CHOL 140 03/14/2022 1030   TRIG 68 03/14/2022 1030   HDL 46 03/14/2022 1030   CHOLHDL 3.8 10/06/2020 1458   LDLCALC 80 03/14/2022 1030   Hepatic Function Panel     Component Value Date/Time   PROT 6.8 07/23/2022 1213   ALBUMIN 3.9 07/23/2022 1213   AST 24 07/23/2022 1213   ALT 28 07/23/2022 1213   ALKPHOS 81 07/23/2022 1213   BILITOT 0.4 07/23/2022 1213      Component Value Date/Time   TSH 1.655 12/20/2021 1430   TSH 1.430 01/23/2021 1525   Nutritional Lab Results  Component Value Date   VD25OH 28.3 (L) 07/23/2022   VD25OH 14.6 (L) 03/14/2022   VD25OH 14.8 (L) 01/23/2021    Follow-Up   Return in about 4 weeks (around 05/27/2023) for For Weight Mangement with Dr. Rikki Spearing.[name] She was informed of the importance of frequent follow up visits to maximize her success with intensive lifestyle modifications for her multiple health conditions.  Attestation Statement   Reviewed by clinician on day of visit: allergies, medications, problem list, medical history, surgical history, family history, social history, and previous encounter notes.     Worthy Rancher, MD

## 2023-04-30 ENCOUNTER — Encounter: Payer: Self-pay | Admitting: Gastroenterology

## 2023-05-08 ENCOUNTER — Other Ambulatory Visit: Payer: Self-pay

## 2023-05-08 ENCOUNTER — Ambulatory Visit (INDEPENDENT_AMBULATORY_CARE_PROVIDER_SITE_OTHER): Payer: 59 | Admitting: Orthopaedic Surgery

## 2023-05-08 ENCOUNTER — Other Ambulatory Visit (INDEPENDENT_AMBULATORY_CARE_PROVIDER_SITE_OTHER): Payer: 59

## 2023-05-08 DIAGNOSIS — M25511 Pain in right shoulder: Secondary | ICD-10-CM | POA: Diagnosis not present

## 2023-05-08 DIAGNOSIS — M67431 Ganglion, right wrist: Secondary | ICD-10-CM | POA: Diagnosis not present

## 2023-05-08 MED ORDER — DICLOFENAC SODIUM 75 MG PO TBEC
75.0000 mg | DELAYED_RELEASE_TABLET | Freq: Two times a day (BID) | ORAL | 1 refills | Status: AC | PRN
  Filled 2023-05-08: qty 60, 30d supply, fill #0

## 2023-05-08 NOTE — Progress Notes (Signed)
 The patient is someone we have seen before.  She comes in with 2 different issues today.  She has been dealing with right shoulder pain for a month now that is getting worse.  She felt a pop in her shoulder when she was reaching above and reaching behind her and she is having a harder time laying on that side and pain with overhead activities and reaching behind her now.  She is a diabetic but reports good blood glucose control.  She also has a recurrent ganglion cyst on the dorsum of her right wrist that she would like to have aspirated today.  We have aspirated this in the past.  She says it really does not bother her at all.  On exam she does have a large cyst on the dorsum of her right wrist.  I was able to use an 18-gauge needle and aspirated the cyst completely.  Her right shoulder exam shows definitely signs of impingement with good motion of the shoulder.  She is able to actively abduct her shoulder and there is no weakness in the rotator cuff but she has a lot of pain out of proportion of exam.  3 views of the right shoulder does show significant arthritis of the [facility] joint but the remainder of the shoulder x-rays are normal.  The glenohumeral joint is well-maintained.  There is decrease in her subacromial outlet.  She is deferring a steroid injection for her right shoulder subacromial outlet given the fact that steroids do cause weight gain in her.  With that being said I would like to send her to outpatient physical therapy for any modalities that can help decrease her right shoulder pain and improve function with her shoulder.  We will then see her back in 6 weeks after course of therapy.  I sent in some diclofenac to see if this will help with pain and inflammation to her pharmacy.

## 2023-05-10 ENCOUNTER — Encounter: Payer: Self-pay | Admitting: Gastroenterology

## 2023-05-13 ENCOUNTER — Other Ambulatory Visit: Payer: Self-pay | Admitting: Orthopaedic Surgery

## 2023-05-13 ENCOUNTER — Other Ambulatory Visit: Payer: Self-pay

## 2023-05-13 ENCOUNTER — Telehealth: Payer: Self-pay

## 2023-05-13 ENCOUNTER — Telehealth: Payer: Self-pay | Admitting: *Deleted

## 2023-05-13 DIAGNOSIS — Z860100 Personal history of colon polyps, unspecified: Secondary | ICD-10-CM

## 2023-05-13 MED ORDER — GABAPENTIN 300 MG PO CAPS: 300.0000 mg | ORAL_CAPSULE | Freq: Every day | ORAL | 3 refills | Status: AC | PRN

## 2023-05-13 NOTE — Telephone Encounter (Signed)
 Yesi,  This pt's BMI is greater than 50; their procedure will need to be performed at the hospital.  Thanks,  Cathlyn Parsons

## 2023-05-13 NOTE — Telephone Encounter (Signed)
 Patient may keep Pre-visit appt 05/15/23. Colonoscopy for [facility] has been cancelled. Patient has been scheduled at [facility] for 06/20/23 - procedure time 11:00 am.

## 2023-05-13 NOTE — Telephone Encounter (Signed)
Thanks Rovonda 

## 2023-05-13 NOTE — Telephone Encounter (Signed)
 This patient is post EMR. Patient supposed to be in [facility]. Thanks. GM

## 2023-05-13 NOTE — Telephone Encounter (Signed)
 Dr. Meridee Score,  This patient has a BMI of 51.42 and her procedure will need to be moved to the hospital.  Do you want her to have an OV first, are is a direct for the hospital ok? Thank you, Eddrick Dilone

## 2023-05-13 NOTE — Telephone Encounter (Signed)
 Request Gabapentin refill, if so to her [facility] pharmacy

## 2023-05-15 ENCOUNTER — Ambulatory Visit: Payer: 59 | Admitting: *Deleted

## 2023-05-15 ENCOUNTER — Other Ambulatory Visit: Payer: Self-pay

## 2023-05-15 VITALS — Ht 65.0 in | Wt 304.0 lb

## 2023-05-15 DIAGNOSIS — Z860100 Personal history of colon polyps, unspecified: Secondary | ICD-10-CM

## 2023-05-15 MED ORDER — NA SULFATE-K SULFATE-MG SULF 17.5-3.13-1.6 GM/177ML PO SOLN
1.0000 | Freq: Once | ORAL | 0 refills | Status: AC
  Filled 2023-05-15: qty 354, 2d supply, fill #0

## 2023-05-15 NOTE — Progress Notes (Signed)
 Pt's name and DOB verified at the beginning of the pre-visit wit 2 identifiers  Permission given to speak with  Pt denies any difficulty with ambulating,sitting, laying down or rolling side to side  Pt has no issues with ambulation   Pt has no issues moving head neck or swallowing  No egg or soy allergy known to patient   No issues known to pt with past sedation with any surgeries or procedures  Pt denies having issues being intubated  No FH of Malignant Hyperthermia  Pt is not on diet pills or shots  Pt is not on home 02   Pt is not on blood thinners   Pt denies issues with constipation   Pt is not on dialysis  Pt denise any abnormal heart rhythms   Pt denies any upcoming cardiac testing  Patient's chart reviewed by Cathlyn Parsons CNRA prior to pre-visit and patient appropriate for the [facility].  Pre-visit completed and red dot placed by patient's name on their procedure day (on provider's schedule).     Visit by phone  Visit in person  Pt states weight is 305 lb  IInstructions reviewed. Pt given [facility], [facility] main # and MD on call # prior to instructions.  Pt states understanding of instructions. Instructed to review again prior to procedure. Pt states they will.   Informed pt that they will receive a text or  call from [facility] regarding there prep med.

## 2023-05-16 ENCOUNTER — Other Ambulatory Visit: Payer: Self-pay

## 2023-05-20 ENCOUNTER — Other Ambulatory Visit: Payer: Self-pay | Admitting: Neurology

## 2023-05-21 NOTE — Therapy (Signed)
 OUTPATIENT PHYSICAL THERAPY SHOULDER EVALUATION   Patient Name: Jenna [name] MRN: [id number] DOB:10-27-1970, 53 y.o., female Today's Date: 05/23/2023  END OF SESSION:  PT End of Session - 05/23/23 0559     Visit Number 1    Number of Visits 13    Date for PT Re-Evaluation 07/12/23    Authorization Type [employer]TE    Authorization - Visit Number 27    Authorization - Number of Visits 1    PT Start Time 1035    PT Stop Time 1105    PT Time Calculation (min) 30 min    Activity Tolerance Patient tolerated treatment well    Behavior During Therapy WFL for tasks assessed/performed             Past Medical History:  Diagnosis Date   Arthritis    Back pain    Back pain    Chest pain    Depression    Edema of both lower extremities    Joint pain    Left arm numbness    Numbness in both hands    Osteoarthritis    Prediabetes    SOB (shortness of breath)    Vitamin D deficiency    Past Surgical History:  Procedure Laterality Date   CHOLECYSTECTOMY     COLONOSCOPY     COLONOSCOPY WITH PROPOFOL N/A 07/19/2022   Procedure: COLONOSCOPY WITH PROPOFOL;  Surgeon: Jenel Lucks, MD;  Location: [facility] ENDOSCOPY;  Service: Gastroenterology;  Laterality: N/A;   COLONOSCOPY WITH PROPOFOL N/A 10/25/2022   Procedure: COLONOSCOPY WITH PROPOFOL;  Surgeon: Meridee Score Netty Starring., MD;  Location: [facility] ENDOSCOPY;  Service: Gastroenterology;  Laterality: N/A;   ENDOSCOPIC MUCOSAL RESECTION N/A 10/25/2022   Procedure: ENDOSCOPIC MUCOSAL RESECTION;  Surgeon: Meridee Score Netty Starring., MD;  Location: [facility] ENDOSCOPY;  Service: Gastroenterology;  Laterality: N/A;   HEMOSTASIS CLIP PLACEMENT  10/25/2022   Procedure: HEMOSTASIS CLIP PLACEMENT;  Surgeon: Lemar Lofty., MD;  Location: [facility] ENDOSCOPY;  Service: Gastroenterology;;   HOT HEMOSTASIS N/A 10/25/2022   Procedure: HOT HEMOSTASIS (ARGON PLASMA COAGULATION/BICAP);  Surgeon: Lemar Lofty., MD;  Location: [facility]  ENDOSCOPY;  Service: Gastroenterology;  Laterality: N/A;   POLYPECTOMY  07/19/2022   Procedure: POLYPECTOMY;  Surgeon: Jenel Lucks, MD;  Location: [facility] ENDOSCOPY;  Service: Gastroenterology;;   SUBMUCOSAL LIFTING INJECTION  07/19/2022   Procedure: SUBMUCOSAL LIFTING INJECTION;  Surgeon: Jenel Lucks, MD;  Location: [facility] ENDOSCOPY;  Service: Gastroenterology;;   SUBMUCOSAL LIFTING INJECTION  10/25/2022   Procedure: SUBMUCOSAL LIFTING INJECTION;  Surgeon: Lemar Lofty., MD;  Location: [facility] ENDOSCOPY;  Service: Gastroenterology;;   SUBMUCOSAL TATTOO INJECTION  10/25/2022   Procedure: SUBMUCOSAL TATTOO INJECTION;  Surgeon: Lemar Lofty., MD;  Location: [facility] ENDOSCOPY;  Service: Gastroenterology;;   TUBAL LIGATION     Patient Active Problem List   Diagnosis Date Noted   Elevated blood pressure reading without diagnosis of hypertension 04/02/2023   Abnormal food appetite 04/02/2023   OSA (obstructive sleep apnea) 11/08/2022   Bilateral leg edema 07/23/2022   Benign neoplasm of ascending colon 07/19/2022   Acute kidney injury (HCC) 03/28/2022   Prediabetes 03/28/2022   Other spondylosis with radiculopathy, cervical region 10/03/2021   Fecal occult blood test positive 07/15/2021   Periodic limb movement 07/11/2021   Loud snoring 01/23/2021   Severe obesity (BMI >= 40) (HCC) 02/08/2020   Insulin resistance 07/28/2019   Depression 07/28/2019   Vitamin D deficiency 12/09/2018   Class 3 severe obesity without serious comorbidity with  body mass index (BMI) of 50.0 to 59.9 in adult [facility]) 01/17/2017   Low back pain 05/17/2016   Unilateral primary osteoarthritis, left knee 05/08/2016   Primary osteoarthritis of both knees 06/27/2015   Closed right fibular fracture 08/10/2014    PCP: Marcine Matar, MD   REFERRING PROVIDER: Kathryne Hitch, MD   REFERRING DIAG: [phone] (ICD-10-CM) - Acute pain of right shoulder   THERAPY DIAG:  Chronic right shoulder  pain  Stiffness of right shoulder, not elsewhere classified  Rationale for Evaluation and Treatment: Rehabilitation  ONSET DATE: 1.5 months  SUBJECTIVE:                                                                                                                                                                                      SUBJECTIVE STATEMENT: No precipitating incident. Experiences popping and her R shoulder will get stuck.  Hand dominance: Right  PERTINENT HISTORY: OA  PAIN:  Are you having pain? Yes: NPRS scale: ave 7/10. Pain range the week prior to the start of PT:0-9/10 Pain location: Superior R GH and lateral shoulder Pain description: Ache and sharp Aggravating factors: reaching in multiple directions Relieving factors: Pulling R arm across body  PRECAUTIONS: None  RED FLAGS: None   WEIGHT BEARING RESTRICTIONS: No  FALLS:  Has patient fallen in last 6 months? No  LIVING ENVIRONMENT: Lives with: lives with an adult companion Lives in: House/apartment Able  OCCUPATION: Disability  PLOF: Independent  PATIENT GOALS:Pain relief  NEXT MD VISIT:   OBJECTIVE:  Note: Objective measures were completed at Evaluation unless otherwise noted.  DIAGNOSTIC FINDINGS:  XR R shoulder 05/08/23 3 views of the right shoulder send no acute findings.  The subacromial  outlet is reduced and there is significant AC joint arthritic changes.   The glenohumeral joint is well-maintained.   PATIENT SURVEYS:  Quick Dash 40/55=66% disability  COGNITION: Overall cognitive status: Within functional limits for tasks assessed     SENSATION: WFL  POSTURE: Forward head, rounded shoulders  UPPER EXTREMITY ROM:   Active ROM Right eval Left eval  Shoulder flexion 110 P 135 145  Shoulder extension    Shoulder abduction    Shoulder adduction    Shoulder internal rotation L4 T8  Shoulder external rotation C7 T3  Elbow flexion    Elbow extension    Wrist flexion     Wrist extension    Wrist ulnar deviation    Wrist radial deviation    Wrist pronation    Wrist supination    (Blank rows = not tested)  UPPER EXTREMITY MMT:  R shoulder strength was limited with report of pain MMT  Right eval Left eval  Shoulder flexion P   Shoulder extension P   Shoulder abduction P   Shoulder adduction P   Shoulder internal rotation P   Shoulder external rotation P   Middle trapezius    Lower trapezius    Elbow flexion    Elbow extension    Wrist flexion    Wrist extension    Wrist ulnar deviation    Wrist radial deviation    Wrist pronation    Wrist supination    Grip strength (lbs)    (Blank rows = not tested)  SHOULDER SPECIAL TESTS: Impingement tests: Hawkins/Kennedy impingement test: negative SLAP lesions:  NT Instability tests:  NT Rotator cuff assessment: Empty can test: negative and Full can test: negative Biceps assessment: Yergason's test: negative  JOINT MOBILITY TESTING:  Appropriate GH jt stability  PALPATION:  TTP AC jt                                                                                                                             TREATMENT DATE:  [facility] Adult PT Treatment:                                                DATE: 05/22/23 Therapeutic Exercise: Developed, instructed in, and pt completed therex as noted in HEP  Self Care: Instruction in sleeping position and pillow support for comfort   PATIENT EDUCATION: Education details: Eval findings, POC, HEP, self care Person educated: Patient Education method: Explanation, Demonstration, Tactile cues, Verbal cues, and Handouts Education comprehension: verbalized understanding, returned demonstration, verbal cues required, and tactile cues required  HOME EXERCISE PROGRAM: Access Code: [email or web address] URL: [email or web address] Date: 05/22/2023 Prepared by: Joellyn Rued  Exercises - Flexion-Extension Shoulder Pendulum with Table Support  - 3 x daily - 7  x weekly - 1 sets - 20 reps - Shoulder Extension with Resistance  - 1 x daily - 7 x weekly - 3 sets - 10 reps - 2 hold  ASSESSMENT:  CLINICAL IMPRESSION: Patient is a 53 y.o. female who was seen today for physical therapy evaluation and treatment for M25.511 (ICD-10-CM) - Acute pain of right shoulder. Pt arrived 15 mins late for eval. Pt presents with signs and symptoms of OA of the R AC joint. ROM, strength, and function are limited by pain. A HEP was initiated. Pt will benefit from skilled PT 2w6 to address impairments to optimize R shoulder function with less pain.   OBJECTIVE IMPAIRMENTS: decreased activity tolerance, decreased ROM, decreased strength, impaired UE functional use, obesity, and pain.   ACTIVITY LIMITATIONS: carrying, lifting, sleeping, reach over head, and caring for others  PARTICIPATION LIMITATIONS: meal prep, cleaning, laundry, and driving  PERSONAL FACTORS: Fitness, Past/current experiences, Social background, and 1 comorbidity: OA  are also affecting patient's functional outcome.   REHAB POTENTIAL:  Good  CLINICAL DECISION MAKING: Stable/uncomplicated  EVALUATION COMPLEXITY: Low   GOALS:  SHORT TERM GOALS=LTGs  LONG TERM GOALS: Target date: 07/12/23  Pt will be Ind in a final HEP to maintain achieved LOF  Baseline: started Goal status: INITIAL  2.  Increase R shoulder AROM to flex 125d, ER T2, IR L2 for improved functional use Baseline: See flow sheets Goal status: INITIAL  3.  Pt will demonstrate OH reach c 2# for functional use with household activities Baseline: Limited Goal status: INITIAL  4.  Pt will report 50% or greater decrease in R shoulder pain for improved function and QOL Baseline: 0-9/10 Goal status: INITIAL  5.  Pt's Quick Dash score will improve by the MCID to 50% as indication of improved function  Baseline: 66% disability Goal status: INITIAL PLAN:  PT FREQUENCY: 2x/week  PT DURATION: 6 weeks  PLANNED INTERVENTIONS: [id number]-  PT Re-evaluation, 97110-Therapeutic exercises, 97530- Therapeutic activity, 97535- Self Care, 32440- Manual therapy, [id number]- Aquatic Therapy, N0272- Electrical stimulation (unattended), [phone]- Ionotophoresis 4mg /ml Dexamethasone, Patient/Family education, Taping, Dry Needling, Joint mobilization, Cryotherapy, and Moist heat  PLAN FOR NEXT SESSION: Assess response to HEP; progress therex as indicated; use of modalities, manual therapy; and TPDN as indicated.   Yanel Dombrosky MS, PT 05/23/23 10:50 AM

## 2023-05-21 NOTE — Telephone Encounter (Signed)
 Last seen on 05/09/22 Follow up scheduled on 06/19/23

## 2023-05-22 ENCOUNTER — Ambulatory Visit: Payer: 59 | Attending: Orthopaedic Surgery

## 2023-05-22 DIAGNOSIS — G8929 Other chronic pain: Secondary | ICD-10-CM | POA: Insufficient documentation

## 2023-05-22 DIAGNOSIS — M25511 Pain in right shoulder: Secondary | ICD-10-CM | POA: Insufficient documentation

## 2023-05-22 DIAGNOSIS — M25611 Stiffness of right shoulder, not elsewhere classified: Secondary | ICD-10-CM | POA: Insufficient documentation

## 2023-05-23 ENCOUNTER — Other Ambulatory Visit: Payer: Self-pay

## 2023-05-27 ENCOUNTER — Other Ambulatory Visit: Payer: Self-pay

## 2023-05-27 ENCOUNTER — Encounter (INDEPENDENT_AMBULATORY_CARE_PROVIDER_SITE_OTHER): Payer: Self-pay | Admitting: Internal Medicine

## 2023-05-27 ENCOUNTER — Telehealth (INDEPENDENT_AMBULATORY_CARE_PROVIDER_SITE_OTHER): Payer: Self-pay | Admitting: Internal Medicine

## 2023-05-27 ENCOUNTER — Ambulatory Visit (INDEPENDENT_AMBULATORY_CARE_PROVIDER_SITE_OTHER): Admitting: Internal Medicine

## 2023-05-27 VITALS — BP 124/81 | HR 90 | Temp 97.9°F | Ht 65.0 in | Wt 314.0 lb

## 2023-05-27 DIAGNOSIS — T50905A Adverse effect of unspecified drugs, medicaments and biological substances, initial encounter: Secondary | ICD-10-CM | POA: Diagnosis not present

## 2023-05-27 DIAGNOSIS — G4733 Obstructive sleep apnea (adult) (pediatric): Secondary | ICD-10-CM | POA: Diagnosis not present

## 2023-05-27 DIAGNOSIS — R7303 Prediabetes: Secondary | ICD-10-CM

## 2023-05-27 DIAGNOSIS — R600 Localized edema: Secondary | ICD-10-CM | POA: Diagnosis not present

## 2023-05-27 DIAGNOSIS — Z6841 Body Mass Index (BMI) 40.0 and over, adult: Secondary | ICD-10-CM

## 2023-05-27 DIAGNOSIS — E88819 Insulin resistance, unspecified: Secondary | ICD-10-CM | POA: Diagnosis not present

## 2023-05-27 DIAGNOSIS — E66813 Obesity, class 3: Secondary | ICD-10-CM

## 2023-05-27 DIAGNOSIS — R635 Abnormal weight gain: Secondary | ICD-10-CM | POA: Insufficient documentation

## 2023-05-27 MED ORDER — SEMAGLUTIDE (1 MG/DOSE) 4 MG/3ML ~~LOC~~ SOPN
1.0000 mg | PEN_INJECTOR | SUBCUTANEOUS | 1 refills | Status: DC
  Filled 2023-05-27: qty 3, 28d supply, fill #0

## 2023-05-27 MED ORDER — FUROSEMIDE 20 MG PO TABS
20.0000 mg | ORAL_TABLET | ORAL | 0 refills | Status: DC | PRN
  Filled 2023-05-27: qty 30, 30d supply, fill #0

## 2023-05-27 NOTE — Assessment & Plan Note (Signed)
 Her HOMA-IR is 10.78 which is markedly elevated. Optimal level < 1.9.   This is complex condition associated with genetics, ectopic fat and lifestyle factors. Insulin resistance may result in increased fat storage, inhibition of the breakdown of fat, cause fluctuations in blood sugar leading to energy crashes and increased cravings for sugary or high carb foods and cause metabolic slowdown making it difficult to lose weight.  This may result in additional weight gain and lead to pre-diabetes and diabetes if untreated. In addition, hyperinsulinemia increases cardiovascular risk, chronic inflammatory response and may increase the risk of obesity related malignancies.  She will be again prescribed Ozempic 1 mg once a week.  She does not have coverage for Wegovy or Zepbound we reviewed patient formulary with her today.  Lab Results  Component Value Date   HGBA1C 5.8 04/04/2023   Lab Results  Component Value Date   INSULIN 41.6 (H) 03/14/2022   INSULIN 16.1 07/28/2019   INSULIN 7.4 12/08/2018   Lab Results  Component Value Date   GLUCOSE 105 (H) 07/23/2022   GLUCOSE 83 12/03/2017    She is currently on Ozempic 1 mg once a week.  Continue current dose

## 2023-05-27 NOTE — Assessment & Plan Note (Signed)
 Likely secondary to obesity and medications like gabapentin.  She will use furosemide as needed for 3 days at a time.

## 2023-05-27 NOTE — Assessment & Plan Note (Signed)
 She has had a progressive increase of hemoglobin A1c now at 6.3 with very high insulin levels.  She had been on metformin in the past but medication was discontinued because of increase in bicarbonate while also taking topiramate.  Patient is now on semaglutide 1 mg once a week with adequate clinical response.  She will continue medication for pharmacoprophylaxis.

## 2023-05-27 NOTE — Telephone Encounter (Signed)
 Good afternoon,  Patient is requesting another preauth for ozempic and it needs to be marked as "urgent" or "emergency".  Thanks!

## 2023-05-27 NOTE — Progress Notes (Signed)
 Office: [phone]  /  Fax: [phone]  Weight Summary And Biometrics  Vitals Temp: 97.9 F (36.6 C) BP: 124/81 Pulse Rate: 90 SpO2: 99 %   Anthropometric Measurements Height: 5\' 5"  (1.651 m) Weight: (!) 314 lb (142.4 kg) BMI (Calculated): 52.25 Weight at Last Visit: 309 lb Weight Lost Since Last Visit: 0 Weight Gained Since Last Visit: 5 lb Starting Weight: 321 lb Total Weight Loss (lbs): 7 lb (3.175 kg) Peak Weight: 321 lb   Body Composition  Body Fat %: 56.9 % Fat Mass (lbs): 179 lbs Muscle Mass (lbs): 128.8 lbs Visceral Fat Rating : 22    No data recorded Today's Visit #: 16  Starting Date: 03/14/22   Subjective   Chief Complaint: Obesity  Interval History Discussed the use of AI scribe software for clinical note transcription with the patient, who gave verbal consent to proceed.  History of Present Illness   Jenna [name] is a 53 year old female with sleep apnea, prediabetes, and insulin resistance who presents for medical weight management.  She has been experiencing challenges with weight management despite being on Ozempic for about a month. She takes four shots of the one milligram dose and notes a significant decrease in appetite. She feels full quickly, often unable to finish meals, and has reduced her portion sizes. She does not feel hungry between meals and has improved her snacking habits, avoiding chips, sodas, and donuts. Despite these changes, she is not losing weight.  She is currently on several medications for pain management, including gabapentin, Cymbalta, and diclofenac, which she started recently. She is concerned that these medications may be contributing to her weight gain. 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  Her stress levels are described as medium, and she follows a protein-based diet, eating three meals a day without  skipping. Breakfast typically includes three eggs and sometimes a slice of bread. She focuses on consuming proteins like chicken and shrimp and incorporates vegetables into her diet. She recalls being at a lower weight in 2021 and attributes her weight gain to the initiation of multiple medications around that time.  She engages in physical activity by walking about 0.6 miles four and a half days a week, which takes her approximately fifteen minutes. Additionally, she performs chair exercises despite experiencing pain.  She has a history of sleep apnea and has not yet received her CPAP machine. She mentions a previous sleep study conducted in 2022, the results of which she has at home. She is awaiting a follow-up sleep study, which has been delayed.        Challenges affecting patient progress: none, low volume of physical activity at present , medical comorbidities, and presence of obesogenic drugs.    Pharmacotherapy for weight management: She is currently taking Ozempic with prediabetes or insulin resistance as the primary indication with adequate clinical response  and without side effects..Patient did not respond or had adverse reaction to metformin in the past..   Assessment and Plan   Treatment Plan For Obesity:  Recommended Dietary Goals  Zahriyah is currently in the action stage of change. As such, her goal is to continue weight management plan. She has agreed to: continue current plan  Behavioral Health and Counseling  We discussed the following behavioral modification strategies today: increasing lean protein intake to established goals, increasing fiber rich foods, avoiding skipping meals, and continue to work on  maintaining a reduced calorie state, getting the recommended amount of protein, incorporating whole foods, making healthy choices, staying well hydrated and practicing mindfulness when eating..  Additional education and resources provided today: None  Recommended  Physical Activity Goals  Liba has been advised to work up to 150 minutes of moderate intensity aerobic activity a week and strengthening exercises 2-3 times per week for cardiovascular health, weight loss maintenance and preservation of muscle mass.   She has agreed to :  continue to gradually increase the amount and intensity of exercise routine  Pharmacotherapy  We discussed various medication options to help Raymond with her weight loss efforts and we both agreed to : adequate clinical response to current dose, continue current regimen  Associated Conditions Impacted by Obesity Treatment  Weight gain due to medication Assessment & Plan: We went back and looked at her weight trend it is very marked that she started to gain weight back in 2021, she correlates this with starting to take a number of pain medications to help deal with different pain syndromes.  I think there is a correlation between these medications and her problems with weight.  She will try to hold gabapentin and diclofenac over the next 3 weeks while she continues to work on nutrition to see if this has an effect on her weight loss.   Insulin resistance Assessment & Plan: Her HOMA-IR is 10.78 which is markedly elevated. Optimal level < 1.9.   This is complex condition associated with genetics, ectopic fat and lifestyle factors. Insulin resistance may result in increased fat storage, inhibition of the breakdown of fat, cause fluctuations in blood sugar leading to energy crashes and increased cravings for sugary or high carb foods and cause metabolic slowdown making it difficult to lose weight.  This may result in additional weight gain and lead to pre-diabetes and diabetes if untreated. In addition, hyperinsulinemia increases cardiovascular risk, chronic inflammatory response and may increase the risk of obesity related malignancies.  She will be again prescribed Ozempic 1 mg once a week.  She does not have coverage  for Wegovy or Zepbound we reviewed patient formulary with her today.  Lab Results  Component Value Date   HGBA1C 5.8 04/04/2023   Lab Results  Component Value Date   INSULIN 41.6 (H) 03/14/2022   INSULIN 16.1 07/28/2019   INSULIN 7.4 12/08/2018   Lab Results  Component Value Date   GLUCOSE 105 (H) 07/23/2022   GLUCOSE 83 12/03/2017    She is currently on Ozempic 1 mg once a week.  Continue current dose   Orders: -     Semaglutide (1 MG/DOSE); Inject 1 mg as directed once a week.  Dispense: 3 mL; Refill: 1  OSA (obstructive sleep apnea) -     Semaglutide (1 MG/DOSE); Inject 1 mg as directed once a week.  Dispense: 3 mL; Refill: 1  Class 3 severe obesity without serious comorbidity with body mass index (BMI) of 50.0 to 59.9 in adult, unspecified obesity type [facility]) Assessment & Plan: Patient has gained weight since last visit. She reports feeling full, with reduced portion sizes and improved snacking habits. Weight gain may be attributed to gabapentin, diclofenac and several anticholinergic drugs which she takes for pain.  She now may have inadequate caloric intake due to Ozempic's appetite suppressant effect. Intentional eating is crucial to ensure adequate caloric intake and prevent energy conservation.   - Continue Ozempic at 1 mg, if unable to get recommended amount of calories and protein we  may have to reduce medication.  She had lost weight initially but at the lower dosages. - Hold gabapentin and diclofenac for three weeks to assess impact on weight   - Encourage intentional eating with three meals daily, ensuring adequate protein intake   - Set calorie target at approximately 1500 calories per day without skipping meals   - Educate on reading food labels and avoiding high sugar and high saturated fat foods    Orders: -     Semaglutide (1 MG/DOSE); Inject 1 mg as directed once a week.  Dispense: 3 mL; Refill: 1  Prediabetes Assessment & Plan: She has had a progressive  increase of hemoglobin A1c now at 6.3 with very high insulin levels.  She had been on metformin in the past but medication was discontinued because of increase in bicarbonate while also taking topiramate.  Patient is now on semaglutide 1 mg once a week with adequate clinical response.  She will continue medication for pharmacoprophylaxis.  Orders: -     Semaglutide (1 MG/DOSE); Inject 1 mg as directed once a week.  Dispense: 3 mL; Refill: 1  Bilateral leg edema Assessment & Plan: Likely secondary to obesity and medications like gabapentin.  She will use furosemide as needed for 3 days at a time.  Orders: -     Furosemide; Take 1 tablet (20 mg total) by mouth as needed for edema or fluid.  Dispense: 30 tablet; Refill: 0     General Health Maintenance   Engaging in regular physical activity, including walking and chair exercises. Making dietary changes to improve health.   - Encourage continued physical activity, including walking and chair exercises   - Educate on balanced diet and reading food labels to make healthier choices          Objective   Physical Exam:  Blood pressure 124/81, pulse 90, temperature 97.9 F (36.6 C), height 5\' 5"  (1.651 m), weight (!) 314 lb (142.4 kg), SpO2 99%. Body mass index is 52.25 kg/m.  General: She is overweight, cooperative, alert, well developed, and in no acute distress. PSYCH: Has normal mood, affect and thought process.   HEENT: EOMI, sclerae are anicteric. Lungs: Normal breathing effort, no conversational dyspnea. Extremities: No edema.  Neurologic: No gross sensory or motor deficits. No tremors or fasciculations noted.    Diagnostic Data Reviewed:  BMET    Component Value Date/Time   NA 140 07/23/2022 1213   K 4.7 07/23/2022 1213   CL 108 (H) 07/23/2022 1213   CO2 19 (L) 07/23/2022 1213   GLUCOSE 105 (H) 07/23/2022 1213   GLUCOSE 118 (H) 12/20/2021 1430   BUN 8 07/23/2022 1213   CREATININE 0.89 07/23/2022 1213   CALCIUM 9.0  07/23/2022 1213   GFRNONAA 55 (L) 12/20/2021 1430   GFRAA >60 09/25/2019 1548   Lab Results  Component Value Date   HGBA1C 5.8 04/04/2023   HGBA1C 5.6 12/03/2017   Lab Results  Component Value Date   INSULIN 41.6 (H) 03/14/2022   INSULIN 7.4 12/08/2018   Lab Results  Component Value Date   TSH 1.655 12/20/2021   CBC    Component Value Date/Time   WBC 8.1 12/20/2021 1430   RBC 4.57 12/20/2021 1430   HGB 12.5 12/20/2021 1430   HGB 12.5 01/23/2021 1525   HCT 38.9 12/20/2021 1430   HCT 37.4 01/23/2021 1525   PLT 333 12/20/2021 1430   PLT 284 01/23/2021 1525   MCV 85.1 12/20/2021 1430   MCV 88 01/23/2021  1525   MCH 27.4 12/20/2021 1430   MCHC 32.1 12/20/2021 1430   RDW 14.6 12/20/2021 1430   RDW 15.1 01/23/2021 1525   Iron Studies    Component Value Date/Time   IRON 53 04/02/2019 1510   TIBC 360 04/02/2019 1510   FERRITIN 26 04/02/2019 1510   IRONPCTSAT 15 04/02/2019 1510   Lipid Panel     Component Value Date/Time   CHOL 140 03/14/2022 1030   TRIG 68 03/14/2022 1030   HDL 46 03/14/2022 1030   CHOLHDL 3.8 10/06/2020 1458   LDLCALC 80 03/14/2022 1030   Hepatic Function Panel     Component Value Date/Time   PROT 6.8 07/23/2022 1213   ALBUMIN 3.9 07/23/2022 1213   AST 24 07/23/2022 1213   ALT 28 07/23/2022 1213   ALKPHOS 81 07/23/2022 1213   BILITOT 0.4 07/23/2022 1213      Component Value Date/Time   TSH 1.655 12/20/2021 1430   TSH 1.430 01/23/2021 1525   Nutritional Lab Results  Component Value Date   VD25OH 28.3 (L) 07/23/2022   VD25OH 14.6 (L) 03/14/2022   VD25OH 14.8 (L) 01/23/2021    Medications: Outpatient Encounter Medications as of 05/27/2023  Medication Sig   diclofenac (VOLTAREN) 75 MG EC tablet Take 1 tablet (75 mg total) by mouth 2 (two) times daily as needed.   DULoxetine (CYMBALTA) 60 MG capsule Take 1 capsule (60 mg total) by mouth daily for back pain and mood.Must have office visit for refills   ferrous sulfate 325 (65 FE) MG  tablet Take 325 mg by mouth every Wednesday.   gabapentin (NEURONTIN) 300 MG capsule Take 1 capsule (300 mg total) by mouth daily as needed.   hydrOXYzine (ATARAX) 25 MG tablet TAKE 1 TO 2 TABLETS BY MOUTH EVERY 6 TO 8 HOURS AS NEEDED FOR ITCHING   ondansetron (ZOFRAN) 4 MG tablet Take 1 tablet (4 mg total) by mouth every 8 (eight) hours as needed for nausea or vomiting.   rOPINIRole (REQUIP) 0.5 MG tablet Take 1 tablet (0.5 mg total) by mouth at bedtime.   tiZANidine (ZANAFLEX) 4 MG tablet Take 1 tablet (4 mg total) by mouth every 8 (eight) hours as needed for muscle spasms.   topiramate (TOPAMAX) 25 MG tablet TAKE 3 TABLETS BY MOUTH AT  BEDTIME   Vitamin D, Ergocalciferol, (DRISDOL) 1.25 MG (50000 UNIT) CAPS capsule Take 1 capsule (50,000 Units total) by mouth every 7 (seven) days.   [DISCONTINUED] furosemide (LASIX) 20 MG tablet Take 1 tablet (20 mg total) by mouth as needed for edema or fluid.   [DISCONTINUED] Semaglutide, 1 MG/DOSE, 4 MG/3ML SOPN Inject 1 mg as directed once a week.   furosemide (LASIX) 20 MG tablet Take 1 tablet (20 mg total) by mouth as needed for edema or fluid.   Semaglutide, 1 MG/DOSE, 4 MG/3ML SOPN Inject 1 mg as directed once a week.   No facility-administered encounter medications on file as of 05/27/2023.     Follow-Up   Return in about 4 weeks (around 06/24/2023) for For Weight Mangement with Dr. Rikki Spearing.[name] She was informed of the importance of frequent follow up visits to maximize her success with intensive lifestyle modifications for her multiple health conditions.  Attestation Statement   Reviewed by clinician on day of visit: allergies, medications, problem list, medical history, surgical history, family history, social history, and previous encounter notes.     Worthy Rancher, MD

## 2023-05-27 NOTE — Assessment & Plan Note (Signed)
 Patient has gained weight since last visit. She reports feeling full, with reduced portion sizes and improved snacking habits. Weight gain may be attributed to gabapentin, diclofenac and several anticholinergic drugs which she takes for pain.  She now may have inadequate caloric intake due to Ozempic's appetite suppressant effect. Intentional eating is crucial to ensure adequate caloric intake and prevent energy conservation.   - Continue Ozempic at 1 mg, if unable to get recommended amount of calories and protein we may have to reduce medication.  She had lost weight initially but at the lower dosages. - Hold gabapentin and diclofenac for three weeks to assess impact on weight   - Encourage intentional eating with three meals daily, ensuring adequate protein intake   - Set calorie target at approximately 1500 calories per day without skipping meals   - Educate on reading food labels and avoiding high sugar and high saturated fat foods

## 2023-05-27 NOTE — Assessment & Plan Note (Signed)
 We went back and looked at her weight trend it is very marked that she started to gain weight back in 2021, she correlates this with starting to take a number of pain medications to help deal with different pain syndromes.  I think there is a correlation between these medications and her problems with weight.  She will try to hold gabapentin and diclofenac over the next 3 weeks while she continues to work on nutrition to see if this has an effect on her weight loss.

## 2023-05-28 ENCOUNTER — Other Ambulatory Visit: Payer: Self-pay

## 2023-05-28 ENCOUNTER — Encounter (INDEPENDENT_AMBULATORY_CARE_PROVIDER_SITE_OTHER): Payer: Self-pay | Admitting: Internal Medicine

## 2023-05-28 ENCOUNTER — Telehealth (INDEPENDENT_AMBULATORY_CARE_PROVIDER_SITE_OTHER): Payer: Self-pay

## 2023-05-28 ENCOUNTER — Ambulatory Visit (INDEPENDENT_AMBULATORY_CARE_PROVIDER_SITE_OTHER): Payer: 59 | Admitting: Internal Medicine

## 2023-05-28 NOTE — Telephone Encounter (Signed)
 PA for Ozempic 1mg   started

## 2023-05-29 NOTE — Telephone Encounter (Signed)
 PA for Ozempic denied, pt notified

## 2023-06-12 ENCOUNTER — Telehealth: Payer: Self-pay | Admitting: Gastroenterology

## 2023-06-12 ENCOUNTER — Encounter: Payer: 59 | Admitting: Gastroenterology

## 2023-06-12 NOTE — Telephone Encounter (Signed)
 Procedure:Colonoscopy Procedure date: 06/20/23 Procedure location: [facility] Arrival Time: 9:30 am Spoke with the patient Y/N: Yes Any prep concerns? No  Has the patient obtained the prep from the pharmacy ? Yes Do you have a care partner and transportation: Yes Any additional concerns? No

## 2023-06-13 ENCOUNTER — Encounter (HOSPITAL_COMMUNITY): Payer: Self-pay | Admitting: Gastroenterology

## 2023-06-19 ENCOUNTER — Encounter: Payer: Self-pay | Admitting: Neurology

## 2023-06-19 ENCOUNTER — Ambulatory Visit (INDEPENDENT_AMBULATORY_CARE_PROVIDER_SITE_OTHER): Payer: 59 | Admitting: Neurology

## 2023-06-19 ENCOUNTER — Encounter: Payer: Self-pay | Admitting: Internal Medicine

## 2023-06-19 ENCOUNTER — Ambulatory Visit (INDEPENDENT_AMBULATORY_CARE_PROVIDER_SITE_OTHER): Payer: 59 | Admitting: Orthopaedic Surgery

## 2023-06-19 ENCOUNTER — Encounter: Payer: Self-pay | Admitting: Orthopaedic Surgery

## 2023-06-19 VITALS — BP 127/93 | HR 85 | Ht 65.0 in | Wt 323.0 lb

## 2023-06-19 DIAGNOSIS — M4802 Spinal stenosis, cervical region: Secondary | ICD-10-CM

## 2023-06-19 DIAGNOSIS — M5030 Other cervical disc degeneration, unspecified cervical region: Secondary | ICD-10-CM | POA: Diagnosis not present

## 2023-06-19 DIAGNOSIS — G4486 Cervicogenic headache: Secondary | ICD-10-CM

## 2023-06-19 DIAGNOSIS — G935 Compression of brain: Secondary | ICD-10-CM

## 2023-06-19 DIAGNOSIS — R519 Headache, unspecified: Secondary | ICD-10-CM | POA: Diagnosis not present

## 2023-06-19 DIAGNOSIS — M7541 Impingement syndrome of right shoulder: Secondary | ICD-10-CM | POA: Diagnosis not present

## 2023-06-19 MED ORDER — TOPIRAMATE 25 MG PO TABS: 75.0000 mg | ORAL_TABLET | Freq: Every day | ORAL | 3 refills | Status: AC

## 2023-06-19 NOTE — Progress Notes (Signed)
 The patient is a 53 year old well-known to [place].  She is still dealing with chronic right shoulder pain.  She has tried anti-inflammatories and outpatient physical therapy for her right shoulder.  It still hurts with overhead activities.  Also reaching behind her causes some discomfort.  There is been no injury.  On my exam a lot of her pain seems to be at the subacromial outlet and the [facility] joint.  Her rotator cuff itself feels strong and there is no blocks or rotation of her right shoulder.  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  At this point she has not had a steroid injection and she has deferred this.  However I did talk to her about seeing my partner Dr. Shon Baton to consider an ultrasound assessment of her right shoulder rotator cuff and then considering a steroid injection under ultrasound either in the [facility] joint on that right side versus the subacromial outlet or even both since she is only prediabetic.  We will see about getting a pulm with Dr. Shon Baton and she agrees with this treatment plan.

## 2023-06-19 NOTE — Progress Notes (Signed)
 Subjective:    Patient ID: Jenna [name] is a 53 y.o. female.  HPI    Huston Foley, MD, PhD [facility] [address] [address] [place], [place] [address]  Ms. Jenna [name] is a 53 year old female with an underlying medical history of arthritis, neck pain, back pain, prediabetes, vitamin D deficiency, depression, lower extremity edema, and morbid obesity with a BMI of over 50, who presents for follow-up consultation of her recurrent headaches.  The patient is unaccompanied today.  She missed an appointment on 01/09/2023 as well as 03/25/23. She had previously followed with Dr. Delena Bali and was last seen by her on 05/09/2022, at which time she was encouraged to continue with Topamax 75 mg nightly.  She was referred to orthopedics for cervical radiculopathy.  I reviewed Dr. Quentin Mulling note and copied the note below for reference.  Today, 06/19/2023: She reports doing well with regards to her headaches.  They have been manageable since she has been on Topamax, she read about in the past and noticed a flareup in her headaches.  She also feels that it helps her dizziness.  She takes 75 mg at night and does not have any obvious side effects.  She has seen an orthopedic doctor for her neck pain and was offered surgery but declined it.  She takes multiple other medications for pain issues particularly knee pain and back pain.  She is on gabapentin and also takes Zanaflex.  She sees Dr. Magnus Ivan for bilateral knee pain and may need left knee replacement but does need to lose weight first.  She was on Ozempic for weight loss.  She had an eye examination last year and received new bifocal eyeglasses.  She admits that she does not always hydrate well, estimates that she drinks about 2 cups of water per day, 1 cup of coffee per day, no daily soda, no alcohol typically.   Of note, she had a cervical spine MRI without contrast on 08/21/2021 and I reviewed the results:  IMPRESSION: 1.  Multilevel cervical spondylosis with resultant mild diffuse spinal stenosis at C3-4 through C6-7. 2. Multifactorial degenerative changes with resultant multilevel foraminal narrowing as above. Notable findings include moderate left C4 foraminal stenosis, severe left with moderate right C5 foraminal narrowing, with moderate right C6 foraminal stenosis. 3. Chiari 1 malformation.   Previously (copied from previous notes for reference):    05/09/2022 (Dr. Delena Bali):  <<Last visit: 09/25/21 Brief HPI: 53 year old female with a history of osteoarthritis, cervical radiculopathy, RLS, depression who follows in clinic for dizziness and headaches. 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   At her last visit, brain MRI was ordered. She was referred to ophthalmology for blurred vision. Topamax was started for headache prevention.   Interval History: MRI brain 10/09/21 showed only 3mm cerebellar ectopia, not enough to be considered Chiari malformation. 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  She saw ophthalmology in January and states her eye exam was normal. Vision has improved with updated prescription.    Her dizziness has resolved. Headache frequency has improved with Topamax, but she continues to have headaches when she bends over or laughs. They are not associated with photophobia, phonophobia, or nausea. They last for ~1 minute at a time. She is currently taking Topamax 75 mg QHS as she could not tolerate 100 mg QHS due to drowsiness.  Continues to have significant neck pain. Takes gabapentin, Cymbalta, and tizanidine which help somewhat, but they all make her drowsy. >>  09/25/21 (Dr. Delena Bali): <<HISTORY OF PRESENT ILLNESS:  The patient presents for evaluation of Chiari malformation found incidentally on C-spine MRI. She had been experiencing radicular neck  pain and paresthesias of the left arm, so MRI C-spine was done on 08/21/21. This revealed multilevel degenerative changes with mild spinal stenosis at C3-4 through C6-7, severe left C4 foraminal stenosis, and moderate right C5 and C6 foraminal stenosis. It also revealed a 9 mm Chiari malformation.   She reports having headaches for several years. She will get intense throbbing occipital headaches every time she laughs, bends forward, coughs, or sneezes. These last for a few seconds to a couple of minutes at a time. This has been present over the past 10 years. She will also have headaches described as bifrontal pressure and throbbing which are associated with phonophobia. They can last 4-5 days at a time. She takes naproxen or Aleve as needed which eases the pain but does resolve it. Reports intermittent migraines in the past.   She also reports persistently blurred vision in both eyes which occurs with and without headaches. It is not improved by wearing her glasses. Has not been to an eye doctor in over a year and has never seen an ophthalmologist.   She developed intermittent vertigo about 5 months ago. This can occur at any time whether she is lying down or sitting. No clear positional triggers. She also has arthritis in her knees and they will occasionally give out from under her which can cause imbalance.    OTHER MEDICAL CONDITIONS: osteoarthritis, cervical radiculopathy, RLS, depression >>  Her Past Medical History Is Significant For: Past Medical History:  Diagnosis Date   Arthritis    Back pain    Back pain    Chest pain    Depression    Edema of both lower extremities    Joint pain    Left arm numbness    Numbness in both hands    Osteoarthritis    Prediabetes    SOB (shortness of breath)    Vitamin D deficiency     Her Past Surgical History Is Significant For: Past Surgical History:  Procedure Laterality Date   CHOLECYSTECTOMY     COLONOSCOPY     COLONOSCOPY WITH PROPOFOL  N/A 07/19/2022   Procedure: COLONOSCOPY WITH PROPOFOL;  Surgeon: Jenel Lucks, MD;  Location: [facility] ENDOSCOPY;  Service: Gastroenterology;  Laterality: N/A;   COLONOSCOPY WITH PROPOFOL N/A 10/25/2022   Procedure: COLONOSCOPY WITH PROPOFOL;  Surgeon: Meridee Score Netty Starring., MD;  Location: [facility] ENDOSCOPY;  Service: Gastroenterology;  Laterality: N/A;   ENDOSCOPIC MUCOSAL RESECTION N/A 10/25/2022   Procedure: ENDOSCOPIC MUCOSAL RESECTION;  Surgeon: Meridee Score Netty Starring., MD;  Location: [facility] ENDOSCOPY;  Service: Gastroenterology;  Laterality: N/A;   HEMOSTASIS CLIP PLACEMENT  10/25/2022   Procedure: HEMOSTASIS CLIP PLACEMENT;  Surgeon: Lemar Lofty., MD;  Location: [facility] ENDOSCOPY;  Service: Gastroenterology;;   HOT HEMOSTASIS N/A 10/25/2022   Procedure: HOT HEMOSTASIS (ARGON PLASMA COAGULATION/BICAP);  Surgeon: Lemar Lofty., MD;  Location: [facility] ENDOSCOPY;  Service: Gastroenterology;  Laterality: N/A;   POLYPECTOMY  07/19/2022   Procedure: POLYPECTOMY;  Surgeon: Jenel Lucks, MD;  Location[facility] ENDOSCOPY;  Service: Gastroenterology;;   SUBMUCOSAL LIFTING INJECTION  07/19/2022   Procedure: SUBMUCOSAL LIFTING INJECTION;  Surgeon: Jenel Lucks, MD;  Location: [facility] ENDOSCOPY;  Service: Gastroenterology;;   SUBMUCOSAL LIFTING INJECTION  10/25/2022   Procedure: SUBMUCOSAL LIFTING INJECTION;  Surgeon: Lemar Lofty., MD;  Location: [facility] ENDOSCOPY;  Service: Gastroenterology;;   SUBMUCOSAL TATTOO INJECTION  10/25/2022   Procedure: SUBMUCOSAL TATTOO INJECTION;  Surgeon: Lemar Lofty., MD;  Location: [facility] ENDOSCOPY;  Service: Gastroenterology;;   TUBAL LIGATION      Her Family History Is Significant For: Family History  Problem Relation Age of Onset   Colon polyps Mother    Diabetes Mother    Hypertension Mother    Hypertension Father    Breast cancer Neg Hx    Colon cancer Neg Hx    Esophageal cancer Neg Hx    Rectal cancer Neg Hx    Stomach cancer Neg  Hx     Her Social History Is Significant For: Social History   Socioeconomic History   Marital status: Single    Spouse name: Not on file   Number of children: Not on file   Years of education: Not on file   Highest education level: GED or equivalent  Occupational History   Occupation: Stay at home, disabled  Tobacco Use   Smoking status: Former    Current packs/day: 0.00    Types: Cigarettes    Quit date: 10/10/2021    Years since quitting: 1.6   Smokeless tobacco: Never  Vaping Use   Vaping status: Never Used  Substance and Sexual Activity   Alcohol use: No    Alcohol/week: 0.0 standard drinks of alcohol   Drug use: No   Sexual activity: Not Currently  Other Topics Concern   Not on file  Social History Narrative   Not on file   Social Drivers of Health   Financial Resource Strain: Low Risk  (04/04/2023)   Overall Financial Resource Strain (CARDIA)    Difficulty of Paying Living Expenses: Not hard at all  Food Insecurity: No Food Insecurity (04/04/2023)   Hunger Vital Sign    Worried About Running Out of Food in the Last Year: Never true    Ran Out of Food in the Last Year: Never true  Transportation Needs: No Transportation Needs (04/04/2023)   PRAPARE - Administrator, Civil Service (Medical): No    Lack of Transportation (Non-Medical): No  Physical Activity: Inactive (04/04/2023)   Exercise Vital Sign    Days of Exercise per Week: 0 days    Minutes of Exercise per Session: 10 min  Stress: Stress Concern Present (04/04/2023)   [employer] of Occupational Health - Occupational Stress Questionnaire    Feeling of Stress : To some extent  Social Connections: Unknown (04/04/2023)   Social Connection and Isolation Panel [NHANES]    Frequency of Communication with Friends and Family: Once a week    Frequency of Social Gatherings with Friends and Family: Once a week    Attends Religious Services: 1 to 4 times per year    Active Member of [employer] or  Organizations: No    Attends Banker Meetings: Never    Marital Status: Patient declined    Her Allergies Are:  Allergies  Allergen Reactions   Diflucan [Fluconazole] Hives   Metformin And Related Other (See Comments)    Decreased bicarbonate  :   Her Current Medications Are:  Outpatient Encounter Medications as of 06/19/2023  Medication Sig   diclofenac (VOLTAREN) 75 MG EC tablet Take 1 tablet (75 mg total) by mouth 2 (two) times daily as needed.   DULoxetine (CYMBALTA) 60 MG capsule Take 1 capsule (60  mg total) by mouth daily for back pain and mood.Must have office visit for refills   ferrous sulfate 325 (65 FE) MG tablet Take 325 mg by mouth every Wednesday.   furosemide (LASIX) 20 MG tablet Take 1 tablet (20 mg total) by mouth as needed for edema or fluid.   gabapentin (NEURONTIN) 300 MG capsule Take 1 capsule (300 mg total) by mouth daily as needed.   hydrOXYzine (ATARAX) 25 MG tablet TAKE 1 TO 2 TABLETS BY MOUTH EVERY 6 TO 8 HOURS AS NEEDED FOR ITCHING   ondansetron (ZOFRAN) 4 MG tablet Take 1 tablet (4 mg total) by mouth every 8 (eight) hours as needed for nausea or vomiting.   rOPINIRole (REQUIP) 0.5 MG tablet Take 1 tablet (0.5 mg total) by mouth at bedtime.   tiZANidine (ZANAFLEX) 4 MG tablet Take 1 tablet (4 mg total) by mouth every 8 (eight) hours as needed for muscle spasms.   topiramate (TOPAMAX) 25 MG tablet TAKE 3 TABLETS BY MOUTH AT  BEDTIME   Vitamin D, Ergocalciferol, (DRISDOL) 1.25 MG (50000 UNIT) CAPS capsule Take 1 capsule (50,000 Units total) by mouth every 7 (seven) days.   Semaglutide, 1 MG/DOSE, 4 MG/3ML SOPN Inject 1 mg as directed once a week. (Patient not taking: Reported on 06/19/2023)   No facility-administered encounter medications on file as of 06/19/2023.  :   Review of Systems:  Out of a complete 14 point review of systems, all are reviewed and negative with the exception of these symptoms as listed below:  Review of Systems   Neurological:        Room 5 Pt is here Alone. Pt states that when she takes her Topamax she won't get any migraines or dizziness, but if she does she will get her migraines and dizziness. Pt states that she does get mild headaches.     Objective:  Neurological Exam  Physical Exam Physical Examination:   Vitals:   06/19/23 1048  BP: (!) 127/93  Pulse: 85    General Examination: The patient is a very pleasant 53 y.o. female in no acute distress. She appears well-developed and well-nourished and well groomed.   HEENT: Normocephalic, atraumatic, pupils are equal, round and reactive to light, extraocular tracking is good without limitation to gaze excursion or nystagmus noted.  Corrective eyeglasses in place.  Hearing is grossly intact. Face is symmetric with normal facial animation. Speech is clear with no dysarthria noted. There is no hypophonia. There is no lip, neck/head, jaw or voice tremor. Neck is supple with full range of passive and active motion. There are no carotid bruits on auscultation. Oropharynx exam reveals: moderate mouth dryness, moderate airway crowding.  Tongue protrudes centrally and palate elevates symmetrically.   Chest: Clear to auscultation without wheezing, rhonchi or crackles noted.  Heart: S1+S2+0, regular and normal without murmurs, rubs or gallops noted.   Abdomen: Soft, non-tender and non-distended.  Extremities: There is no pitting edema in the distal lower extremities bilaterally.   Skin: Warm and dry without trophic changes noted.   Musculoskeletal: exam reveals limited range of motion in both knees, left worse than right.    Neurologically:  Mental status: The patient is awake, alert and oriented in all 4 spheres. Her immediate and remote memory, attention, language skills and fund of knowledge are appropriate. There is no evidence of aphasia, agnosia, apraxia or anomia. Speech is clear with normal prosody and enunciation. Thought process is linear.  Mood is normal and affect is normal.  Cranial  nerves II - XII are as described above under HEENT exam.  Motor exam: Normal bulk, strength and tone is noted. There is no obvious action or resting tremor.  Fine motor skills and coordination: grossly intact.  Cerebellar testing: No dysmetria or intention tremor. There is no truncal or gait ataxia.  Sensory exam: intact to light touch in the upper and lower extremities.  Gait, station and balance: She stands with mild difficulty and pushes herself up.  She stands slightly wide-based.  She walks with a limp, no walking aid.   Assessment and plan:  In summary, Jenna [name] is a 53 year old female with an underlying medical history of arthritis, neck pain, back pain, prediabetes, vitamin D deficiency, depression, lower extremity edema, and morbid obesity with a BMI of over 50, who presents for follow-up consultation of her recurrent headaches.  She has had good success with topiramate 75 mg at bedtime which she has maintained.  She is tolerating the medication without obvious side effects and is willing to maintain treatment.  I renewed her prescription for topiramate.  She is working on weight loss.  We talked about headache triggers and the importance of staying well-hydrated.  She is encouraged to increase her water intake.  She is followed by orthopedics for her knee arthritis primarily and has had physical therapy.  She also has seen orthopedics for her neck pain but is currently not planning any surgery for degenerative cervical spine disease.  She is up-to-date with her eye examination and has her next appointment in December 2025.  Her neurological exam is benign at this time.  She is advised to follow-up routinely in this clinic to see one of our nurse practitioners in 1 year.  I answered all her questions today and she was in agreement.   I spent 45 minutes in total face-to-face time and in reviewing records during pre-charting, more than 50% of which  was spent in counseling and coordination of care, reviewing test results, reviewing medications and treatment regimen and/or in discussing or reviewing the diagnosis of recurrent headaches, the prognosis and treatment options. Pertinent laboratory and imaging test results that were available during this visit with the patient were reviewed by me and considered in my medical decision making (see chart for details).

## 2023-06-20 ENCOUNTER — Other Ambulatory Visit: Payer: Self-pay

## 2023-06-20 ENCOUNTER — Ambulatory Visit (HOSPITAL_BASED_OUTPATIENT_CLINIC_OR_DEPARTMENT_OTHER)

## 2023-06-20 ENCOUNTER — Encounter (HOSPITAL_COMMUNITY)
Admission: RE | Disposition: A | Discharge status: Home / Self Care | Payer: Self-pay | Source: Home / Self Care | Attending: Gastroenterology

## 2023-06-20 ENCOUNTER — Ambulatory Visit (HOSPITAL_COMMUNITY)

## 2023-06-20 ENCOUNTER — Ambulatory Visit (HOSPITAL_COMMUNITY)
Admission: RE | Admit: 2023-06-20 | Discharge: 2023-06-20 | Disposition: A | Discharge status: Home / Self Care | Attending: Gastroenterology | Admitting: Gastroenterology

## 2023-06-20 ENCOUNTER — Encounter (HOSPITAL_COMMUNITY): Payer: Self-pay | Admitting: Gastroenterology

## 2023-06-20 DIAGNOSIS — Z87891 Personal history of nicotine dependence: Secondary | ICD-10-CM | POA: Insufficient documentation

## 2023-06-20 DIAGNOSIS — Z6841 Body Mass Index (BMI) 40.0 and over, adult: Secondary | ICD-10-CM | POA: Insufficient documentation

## 2023-06-20 DIAGNOSIS — Z9889 Other specified postprocedural states: Secondary | ICD-10-CM | POA: Diagnosis not present

## 2023-06-20 DIAGNOSIS — Z860100 Personal history of colon polyps, unspecified: Secondary | ICD-10-CM

## 2023-06-20 DIAGNOSIS — Z1211 Encounter for screening for malignant neoplasm of colon: Secondary | ICD-10-CM

## 2023-06-20 DIAGNOSIS — K635 Polyp of colon: Secondary | ICD-10-CM | POA: Diagnosis not present

## 2023-06-20 DIAGNOSIS — E6689 Other obesity not elsewhere classified: Secondary | ICD-10-CM | POA: Diagnosis not present

## 2023-06-20 DIAGNOSIS — K641 Second degree hemorrhoids: Secondary | ICD-10-CM

## 2023-06-20 DIAGNOSIS — Z09 Encounter for follow-up examination after completed treatment for conditions other than malignant neoplasm: Secondary | ICD-10-CM | POA: Insufficient documentation

## 2023-06-20 DIAGNOSIS — D122 Benign neoplasm of ascending colon: Secondary | ICD-10-CM

## 2023-06-20 DIAGNOSIS — Z860101 Personal history of adenomatous and serrated colon polyps: Secondary | ICD-10-CM

## 2023-06-20 DIAGNOSIS — D123 Benign neoplasm of transverse colon: Secondary | ICD-10-CM

## 2023-06-20 HISTORY — PX: SUBMUCOSAL INJECTION: SHX5543

## 2023-06-20 HISTORY — PX: POLYPECTOMY: SHX149

## 2023-06-20 HISTORY — PX: COLONOSCOPY WITH PROPOFOL: SHX5780

## 2023-06-20 SURGERY — COLONOSCOPY WITH PROPOFOL
Anesthesia: Monitor Anesthesia Care

## 2023-06-20 MED ORDER — SPOT INK MARKER SYRINGE KIT
PACK | SUBMUCOSAL | Status: DC | PRN
  Administered 2023-06-20: 2 mL via SUBMUCOSAL

## 2023-06-20 MED ORDER — SODIUM CHLORIDE 0.9 % IV SOLN: INTRAVENOUS | Status: DC

## 2023-06-20 MED ORDER — LIDOCAINE 2% (20 MG/ML) 5 ML SYRINGE
INTRAMUSCULAR | Status: DC | PRN
  Administered 2023-06-20 (×2): 50 mg via INTRAVENOUS

## 2023-06-20 MED ORDER — PROPOFOL 500 MG/50ML IV EMUL
INTRAVENOUS | Status: DC | PRN
  Administered 2023-06-20: 150 ug/kg/min via INTRAVENOUS

## 2023-06-20 MED ORDER — LACTATED RINGERS IV SOLN: INTRAVENOUS | Status: DC | PRN

## 2023-06-20 MED ORDER — SODIUM CHLORIDE 0.9 % IV SOLN
INTRAVENOUS | Status: AC | PRN
  Administered 2023-06-20: 500 mL via INTRAMUSCULAR

## 2023-06-20 MED ORDER — PROPOFOL 10 MG/ML IV BOLUS
INTRAVENOUS | Status: DC | PRN
  Administered 2023-06-20: 30 mg via INTRAVENOUS
  Administered 2023-06-20: 20 mg via INTRAVENOUS
  Administered 2023-06-20: 30 mg via INTRAVENOUS

## 2023-06-20 MED ORDER — SPOT INK MARKER SYRINGE KIT
PACK | SUBMUCOSAL | Status: AC
  Filled 2023-06-20: qty 5

## 2023-06-20 MED ORDER — PHENYLEPHRINE 80 MCG/ML (10ML) SYRINGE FOR IV PUSH (FOR BLOOD PRESSURE SUPPORT)
PREFILLED_SYRINGE | INTRAVENOUS | Status: DC | PRN
  Administered 2023-06-20: 80 ug via INTRAVENOUS

## 2023-06-20 SURGICAL SUPPLY — 20 items

## 2023-06-20 NOTE — Anesthesia Preprocedure Evaluation (Signed)
 Anesthesia Evaluation  Patient identified by MRN, date of birth, ID band Patient awake    Reviewed: Allergy & Precautions, H&P , NPO status , Patient's Chart, lab work & pertinent test results  Airway Mallampati: III  TM Distance: >3 FB Neck ROM: Full    Dental no notable dental hx. (+) Edentulous Upper, Dental Advisory Given   Pulmonary sleep apnea , former smoker   Pulmonary exam normal breath sounds clear to auscultation       Cardiovascular negative cardio ROS  Rhythm:Regular Rate:Normal     Neuro/Psych    Depression    negative neurological ROS     GI/Hepatic negative GI ROS, Neg liver ROS,,,  Endo/Other    Class 4 obesity  Renal/GU negative Renal ROS  negative genitourinary   Musculoskeletal  (+) Arthritis , Osteoarthritis,    Abdominal   Peds  Hematology negative hematology ROS (+)   Anesthesia Other Findings   Reproductive/Obstetrics negative OB ROS                             Anesthesia Physical Anesthesia Plan  ASA: 4  Anesthesia Plan: MAC   Post-op Pain Management: Minimal or no pain anticipated   Induction: Intravenous  PONV Risk Score and Plan: 2 and Propofol infusion  Airway Management Planned: Natural Airway and Simple Face Mask  Additional Equipment:   Intra-op Plan:   Post-operative Plan:   Informed Consent: I have reviewed the patients History and Physical, chart, labs and discussed the procedure including the risks, benefits and alternatives for the proposed anesthesia with the patient or authorized representative who has indicated his/her understanding and acceptance.     Dental advisory given  Plan Discussed with: CRNA  Anesthesia Plan Comments:        Anesthesia Quick Evaluation

## 2023-06-20 NOTE — Anesthesia Postprocedure Evaluation (Signed)
 Anesthesia Post Note  Patient: Jenna [name]  Procedure(s) Performed: COLONOSCOPY WITH PROPOFOL POLYPECTOMY, INTESTINE INJECTION, SUBMUCOSAL     Patient location during evaluation: Endoscopy Anesthesia Type: MAC Level of consciousness: awake and alert Pain management: pain level controlled Vital Signs Assessment: post-procedure vital signs reviewed and stable Respiratory status: spontaneous breathing, nonlabored ventilation and respiratory function stable Cardiovascular status: stable and blood pressure returned to baseline Postop Assessment: no apparent nausea or vomiting Anesthetic complications: no  No notable events documented.  Last Vitals:  Vitals:   06/20/23 1110 06/20/23 1115  BP: (!) 147/98 138/89  Pulse: 90 85  Resp: 15 14  Temp:    SpO2: 100% 100%    Last Pain:  Vitals:   06/20/23 1115  TempSrc:   PainSc: 0-No pain                 Maiana Hennigan,W. EDMOND

## 2023-06-20 NOTE — Transfer of Care (Signed)
 Immediate Anesthesia Transfer of Care Note  Patient: Elmo Shumard  Procedure(s) Performed: COLONOSCOPY WITH PROPOFOL POLYPECTOMY, INTESTINE INJECTION, SUBMUCOSAL  Patient Location: Endoscopy Unit  Anesthesia Type:MAC  Level of Consciousness: sedated  Airway & Oxygen Therapy: Patient Spontanous Breathing and Patient connected to face mask oxygen  Post-op Assessment: Report given to RN and Post -op Vital signs reviewed and stable  Post vital signs: Reviewed and stable  Last Vitals:  Vitals Value Taken Time  BP 134/81 06/20/23 1057  Temp    Pulse 94 06/20/23 1057  Resp 17 06/20/23 1057  SpO2 100 % 06/20/23 1057    Last Pain:  Vitals:   06/20/23 0947  PainSc: 0-No pain         Complications: No notable events documented.

## 2023-06-20 NOTE — H&P (Signed)
 GASTROENTEROLOGY PROCEDURE H&P NOTE   Primary Care Physician: Marcine Matar, MD  HPI: Jenna [name] is a 53 y.o. female who presents for Colonoscopy for followup of [facility] TA s/p prior resection attempt by other provider and then an EMR attempt by me in 2024.  Past Medical History:  Diagnosis Date   Arthritis    Back pain    Back pain    Chest pain    Depression    Edema of both lower extremities    Joint pain    Left arm numbness    Numbness in both hands    Osteoarthritis    Prediabetes    SOB (shortness of breath)    Vitamin D deficiency    Past Surgical History:  Procedure Laterality Date   CHOLECYSTECTOMY     COLONOSCOPY     COLONOSCOPY WITH PROPOFOL N/A 07/19/2022   Procedure: COLONOSCOPY WITH PROPOFOL;  Surgeon: Jenel Lucks, MD;  Location: [facility] ENDOSCOPY;  Service: Gastroenterology;  Laterality: N/A;   COLONOSCOPY WITH PROPOFOL N/A 10/25/2022   Procedure: COLONOSCOPY WITH PROPOFOL;  Surgeon: Meridee Score Netty Starring., MD;  Location: [facility] ENDOSCOPY;  Service: Gastroenterology;  Laterality: N/A;   ENDOSCOPIC MUCOSAL RESECTION N/A 10/25/2022   Procedure: ENDOSCOPIC MUCOSAL RESECTION;  Surgeon: Meridee Score Netty Starring., MD;  Location: [facility] ENDOSCOPY;  Service: Gastroenterology;  Laterality: N/A;   HEMOSTASIS CLIP PLACEMENT  10/25/2022   Procedure: HEMOSTASIS CLIP PLACEMENT;  Surgeon: Lemar Lofty., MD;  Location: [facility] ENDOSCOPY;  Service: Gastroenterology;;   HOT HEMOSTASIS N/A 10/25/2022   Procedure: HOT HEMOSTASIS (ARGON PLASMA COAGULATION/BICAP);  Surgeon: Lemar Lofty., MD;  Location: [facility] ENDOSCOPY;  Service: Gastroenterology;  Laterality: N/A;   POLYPECTOMY  07/19/2022   Procedure: POLYPECTOMY;  Surgeon: Jenel Lucks, MD;  Location: [facility] ENDOSCOPY;  Service: Gastroenterology;;   SUBMUCOSAL LIFTING INJECTION  07/19/2022   Procedure: SUBMUCOSAL LIFTING INJECTION;  Surgeon: Jenel Lucks, MD;  Location: [facility] ENDOSCOPY;  Service:  Gastroenterology;;   SUBMUCOSAL LIFTING INJECTION  10/25/2022   Procedure: SUBMUCOSAL LIFTING INJECTION;  Surgeon: Lemar Lofty., MD;  Location: [facility] ENDOSCOPY;  Service: Gastroenterology;;   SUBMUCOSAL TATTOO INJECTION  10/25/2022   Procedure: SUBMUCOSAL TATTOO INJECTION;  Surgeon: Lemar Lofty., MD;  Location: [facility] ENDOSCOPY;  Service: Gastroenterology;;   TUBAL LIGATION     Current Facility-Administered Medications  Medication Dose Route Frequency Provider Last Rate Last Admin   0.9 %  sodium chloride infusion   Intravenous Continuous Mansouraty, Netty Starring., MD       0.9 %  sodium chloride infusion    Continuous PRN Mansouraty, Netty Starring., MD 10 mL/hr at 06/20/23 0957 500 mL at 06/20/23 0957    Current Facility-Administered Medications:    0.9 %  sodium chloride infusion, , Intravenous, Continuous, Mansouraty, Netty Starring., MD   0.9 %  sodium chloride infusion, , , Continuous PRN, Mansouraty, Netty Starring., MD, Last Rate: 10 mL/hr at 06/20/23 0957, 500 mL at 06/20/23 0957 Allergies  Allergen Reactions   Diflucan [Fluconazole] Hives   Metformin And Related Other (See Comments)    Decreased bicarbonate   Family History  Problem Relation Age of Onset   Colon polyps Mother    Diabetes Mother    Hypertension Mother    Hypertension Father    Breast cancer Neg Hx    Colon cancer Neg Hx    Esophageal cancer Neg Hx    Rectal cancer Neg Hx    Stomach cancer Neg Hx    Social History  Socioeconomic History   Marital status: Single    Spouse name: Not on file   Number of children: Not on file   Years of education: Not on file   Highest education level: GED or equivalent  Occupational History   Occupation: Stay at home, disabled  Tobacco Use   Smoking status: Former    Current packs/day: 0.00    Types: Cigarettes    Quit date: 10/10/2021    Years since quitting: 1.6   Smokeless tobacco: Never  Vaping Use   Vaping status: Never Used  Substance and Sexual  Activity   Alcohol use: No    Alcohol/week: 0.0 standard drinks of alcohol   Drug use: No   Sexual activity: Not Currently  Other Topics Concern   Not on file  Social History Narrative   Not on file   Social Drivers of Health   Financial Resource Strain: Low Risk  (04/04/2023)   Overall Financial Resource Strain (CARDIA)    Difficulty of Paying Living Expenses: Not hard at all  Food Insecurity: No Food Insecurity (04/04/2023)   Hunger Vital Sign    Worried About Running Out of Food in the Last Year: Never true    Ran Out of Food in the Last Year: Never true  Transportation Needs: No Transportation Needs (04/04/2023)   PRAPARE - Administrator, Civil Service (Medical): No    Lack of Transportation (Non-Medical): No  Physical Activity: Inactive (04/04/2023)   Exercise Vital Sign    Days of Exercise per Week: 0 days    Minutes of Exercise per Session: 10 min  Stress: Stress Concern Present (04/04/2023)   [employer] of Occupational Health - Occupational Stress Questionnaire    Feeling of Stress : To some extent  Social Connections: Unknown (04/04/2023)   Social Connection and Isolation Panel [NHANES]    Frequency of Communication with Friends and Family: Once a week    Frequency of Social Gatherings with Friends and Family: Once a week    Attends Religious Services: 1 to 4 times per year    Active Member of [employer] or Organizations: No    Attends Banker Meetings: Never    Marital Status: Patient declined  Intimate Partner Violence: Unknown (06/14/2021)   Received from [employer]   HITS    Physically Hurt: Not on file    Insult or Talk Down To: Not on file    Threaten Physical Harm: Not on file    Scream or Curse: Not on file    Physical Exam: Today's Vitals   06/13/23 1138 06/20/23 0947  BP:  (!) 143/82  Pulse:  92  Resp:  15  SpO2:  100%  Weight: (!) 138.3 kg (!) 138 kg  Height:  5\' 5"  (1.651 m)  PainSc:  0-No pain   Body  mass index is 50.63 kg/m. GEN: NAD EYE: Sclerae anicteric ENT: MMM CV: Non-tachycardic GI: Soft, NT/ND NEURO:  Alert & Oriented x 3  Lab Results: No results for input(s): "WBC", "HGB", "HCT", "PLT" in the last 72 hours. BMET No results for input(s): "NA", "K", "CL", "CO2", "GLUCOSE", "BUN", "CREATININE", "CALCIUM" in the last 72 hours. LFT No results for input(s): "PROT", "ALBUMIN", "AST", "ALT", "ALKPHOS", "BILITOT", "BILIDIR", "IBILI" in the last 72 hours. PT/INR No results for input(s): "LABPROT", "INR" in the last 72 hours.   Impression / Plan: This is a 53 y.o.female who presents for Colonoscopy for followup of [facility] TA s/p prior resection attempt  by other provider and then an EMR attempt by me in 2024.  The risks and benefits of endoscopic evaluation/treatment were discussed with the patient and/or family; these include but are not limited to the risk of perforation, infection, bleeding, missed lesions, lack of diagnosis, severe illness requiring hospitalization, as well as anesthesia and sedation related illnesses.  The patient's history has been reviewed, patient examined, no change in status, and deemed stable for procedure.  The patient and/or family is agreeable to proceed.    Corliss Parish, MD [facility] Office # [phone]

## 2023-06-20 NOTE — Op Note (Signed)
 [facility] Patient Name: Jenna [name] Procedure Date: 06/20/2023 MRN: [id number] Attending MD: Corliss Parish , MD, [id number] Date of Birth: 1971-01-31 CSN: [id number] Age: 53 Admit Type: Outpatient Procedure:                Colonoscopy Indications:              Surveillance: Personal history of piecemeal removal                            of adenoma on last colonoscopy (less than 1 year                            ago), High risk colon cancer surveillance: Personal                            history of adenoma (10 mm or greater in size) Providers:                Corliss Parish, MD, Margaree Mackintosh, RN,                            Kandice Robinsons, Technician Referring MD:              Medicines:                Monitored Anesthesia Care Complications:            No immediate complications. Estimated Blood Loss:     Estimated blood loss was minimal. Procedure:                Pre-Anesthesia Assessment:                           - Prior to the procedure, a History and Physical                            was performed, and patient medications and                            allergies were reviewed. The patient's tolerance of                            previous anesthesia was also reviewed. The risks                            and benefits of the procedure and the sedation                            options and risks were discussed with the patient.                            All questions were answered, and informed consent                            was obtained. Prior Anticoagulants: The patient has  taken no anticoagulant or antiplatelet agents                            except for NSAID medication. ASA Grade Assessment:                            III - A patient with severe systemic disease. After                            reviewing the risks and benefits, the patient was                            deemed in satisfactory  condition to undergo the                            procedure.                           After obtaining informed consent, the colonoscope                            was passed under direct vision. Throughout the                            procedure, the patient's blood pressure, pulse, and                            oxygen saturations were monitored continuously. The                            CF-HQ190L ([id number]) Olympus colonoscope was                            introduced through the anus and advanced to the 3                            cm into the ileum. The colonoscopy was performed                            without difficulty. The patient tolerated the                            procedure. The quality of the bowel preparation was                            adequate. The terminal ileum, ileocecal valve,                            appendiceal orifice, and rectum were photographed. Scope In: 10:21:26 AM Scope Out: 10:48:46 AM Scope Withdrawal Time: 0 hours 21 minutes 53 seconds  Total Procedure Duration: 0 hours 27 minutes 20 seconds  Findings:      The digital rectal exam findings include hemorrhoids. Pertinent       negatives include no palpable rectal lesions.  The terminal ileum and ileocecal valve appeared normal.      A medium post mucosectomy scar was found in the proximal ascending       colon. The scar tissue was mostly healthy in appearance. There were 3       areas of what appeared to be recurrent polypoid tissue (no greater than       5 mm in size) mostly around the periphery of the scar. The scar site was       extensively sampled with a cold snare for histology to ensure complete       removal of this tissue and see if this was true recurrence or not. Area       distal to this scar was tattooed with an injection of Spot (carbon       black).      A 3 mm polyp was found in the transverse colon. The polyp was sessile.       The polyp was removed with a cold snare.  Resection and retrieval were       complete.      Normal mucosa was found in the entire colon otherwise.      Non-bleeding non-thrombosed internal hemorrhoids were found during       retroflexion, during perianal exam and during digital exam. The       hemorrhoids were Grade II (internal hemorrhoids that prolapse but reduce       spontaneously). Impression:               - Hemorrhoids found on digital rectal exam.                           - The examined portion of the ileum was normal.                           - Post mucosectomy scar in the proximal ascending                            colon. There appears to be small peripheral areas                            of recurrence (completely removed with snare                            today). Tattoo distal for demarcation purposes.                           - One 3 mm polyp in the transverse colon, removed                            with a cold snare. Resected and retrieved.                           - Normal mucosa in the entire examined colon                            otherwise.                           -  Non-bleeding non-thrombosed internal hemorrhoids. Moderate Sedation:      Not Applicable - Patient had care per Anesthesia. Recommendation:           - The patient will be observed post-procedure,                            until all discharge criteria are met.                           - Discharge patient to home.                           - Patient has a contact number available for                            emergencies. The signs and symptoms of potential                            delayed complications were discussed with the                            patient. Return to normal activities tomorrow.                            Written discharge instructions were provided to the                            patient.                           - High fiber diet.                           - Use FiberCon 1-2 tablets PO daily.                            - Minimize NSAIDs as able for next 1 week if                            possible.                           - Continue present medications.                           - Await pathology results.                           - Repeat colonoscopy in 1 year for surveillance. As                            long as all looks well at that point we will push                            out to 3 years thereafter.                           -  The findings and recommendations were discussed                            with the patient. Procedure Code(s):        --- Professional ---                           [phone], Colonoscopy, flexible; with removal of                            tumor(s), polyp(s), or other lesion(s) by snare                            technique                           [id number], Colonoscopy, flexible; with directed                            submucosal injection(s), any substance Diagnosis Code(s):        --- Professional ---                           Z86.010, Personal history of colonic polyps                           K64.1, Second degree hemorrhoids                           Z98.890, Other specified postprocedural states                           D12.3, Benign neoplasm of transverse colon (hepatic                            flexure or splenic flexure)                           Z09, Encounter for follow-up examination after                            completed treatment for conditions other than                            malignant neoplasm CPT copyright 2022 American Medical Association. All rights reserved. The codes documented in this report are preliminary and upon coder review may  be revised to meet current compliance requirements. Corliss Parish, MD 06/20/2023 11:01:56 AM Number of Addenda: 0

## 2023-06-20 NOTE — Discharge Instructions (Signed)

## 2023-06-21 LAB — SURGICAL PATHOLOGY

## 2023-06-22 ENCOUNTER — Encounter: Payer: Self-pay | Admitting: Internal Medicine

## 2023-06-23 ENCOUNTER — Encounter (HOSPITAL_COMMUNITY): Payer: Self-pay | Admitting: Gastroenterology

## 2023-06-26 ENCOUNTER — Encounter: Payer: Self-pay | Admitting: Internal Medicine

## 2023-06-26 ENCOUNTER — Encounter (INDEPENDENT_AMBULATORY_CARE_PROVIDER_SITE_OTHER): Payer: Self-pay | Admitting: Internal Medicine

## 2023-06-26 ENCOUNTER — Ambulatory Visit (INDEPENDENT_AMBULATORY_CARE_PROVIDER_SITE_OTHER): Admitting: Internal Medicine

## 2023-06-26 VITALS — BP 132/84 | HR 88 | Temp 98.0°F | Ht 65.0 in | Wt 322.0 lb

## 2023-06-26 DIAGNOSIS — Z6841 Body Mass Index (BMI) 40.0 and over, adult: Secondary | ICD-10-CM

## 2023-06-26 DIAGNOSIS — E66813 Obesity, class 3: Secondary | ICD-10-CM

## 2023-06-26 DIAGNOSIS — R7303 Prediabetes: Secondary | ICD-10-CM

## 2023-06-26 DIAGNOSIS — E88819 Insulin resistance, unspecified: Secondary | ICD-10-CM

## 2023-06-26 DIAGNOSIS — G4733 Obstructive sleep apnea (adult) (pediatric): Secondary | ICD-10-CM

## 2023-06-26 MED ORDER — LOMAIRA 8 MG PO TABS: 8.0000 mg | ORAL_TABLET | Freq: Every day | ORAL | 1 refills | Status: DC

## 2023-06-26 NOTE — Assessment & Plan Note (Signed)
 Weight gain since last visit despite adherence to a category three meal plan and 70% calorie tracking. Exercises once weekly for five minutes. Discontinued Ozempic one month ago due to insurance issues, resulting in increased sugar cravings and mood changes. Lomaira (phentermine) discussed as an alternative for appetite suppression. Lomaira is a stimulant with risks of increased blood pressure, heart rate, and potential dependence, though low. May cause dry mouth and should not disrupt sleep. Informed about using the patient portal for insurance coverage communication and [employer] app for discounts if insurance does not cover Lomaira.  - Prescribe Lomaira (phentermine) 8 mg, to be taken on an empty stomach 30 minutes before breakfast. - Instruct to use [employer] app for potential discounts if insurance does not cover Lomaira. - Continue topiramate for migraines. - Discuss with PCP about potential bladder ultrasound if urinary symptoms persist. - Follow up in six weeks to assess response to medication.

## 2023-06-26 NOTE — Assessment & Plan Note (Addendum)
 She has had a progressive increase of hemoglobin A1c now at 6.3 with very high insulin levels.  She had been on metformin in the past but medication was discontinued because of increase in bicarbonate while also taking topiramate.  Her insurance no longer covers GLP-1 so she does not have anything to take for pharmacoprophylaxis she will continue to work on nutrition and behavioral strategies for diabetes prevention.  She is certainly high risk for progression

## 2023-06-26 NOTE — Assessment & Plan Note (Signed)
 Her HOMA-IR is 10.78 which is markedly elevated. Optimal level < 1.9.   This is complex condition associated with genetics, ectopic fat and lifestyle factors. Insulin resistance may result in increased fat storage, inhibition of the breakdown of fat, cause fluctuations in blood sugar leading to energy crashes and increased cravings for sugary or high carb foods and cause metabolic slowdown making it difficult to lose weight.  This may result in additional weight gain and lead to pre-diabetes and diabetes if untreated. In addition, hyperinsulinemia increases cardiovascular risk, chronic inflammatory response and may increase the risk of obesity related malignancies.  She will be again prescribed Ozempic 1 mg once a week.  She does not have coverage for Wegovy or Zepbound we reviewed patient formulary with her today.  Lab Results  Component Value Date   HGBA1C 5.8 04/04/2023   Lab Results  Component Value Date   INSULIN 41.6 (H) 03/14/2022   INSULIN 16.1 07/28/2019   INSULIN 7.4 12/08/2018   Lab Results  Component Value Date   GLUCOSE 105 (H) 07/23/2022   GLUCOSE 83 12/03/2017    Patient is intolerant to metformin and her insurance no longer covers Ozempic.  She will continue to work on reducing simple and added sugars in her diet and continue current weight management strategy

## 2023-06-26 NOTE — Assessment & Plan Note (Signed)
 Had a sleep study but never started CPAP. Undergoing repeat sleep for unknown reasons.  I encouraged her to reach out to sleep specialist to read study to see if she could obtain results.

## 2023-06-26 NOTE — Progress Notes (Signed)
 Office: [phone]  /  Fax: [phone]  Weight Summary And Biometrics  Vitals Temp: 98 F (36.7 C) BP: 132/84 Pulse Rate: 88 SpO2: 100 %   Anthropometric Measurements Height: 5\' 5"  (1.651 m) Weight: (!) 322 lb (146.1 kg) BMI (Calculated): 53.58 Weight at Last Visit: 314 lb Weight Lost Since Last Visit: 0 lb Weight Gained Since Last Visit: 8 lb Starting Weight: 321 lb Total Weight Loss (lbs): 0 lb (0 kg) Peak Weight: 321 lb   Body Composition  Body Fat %: 56.9 % Fat Mass (lbs): 183.4 lbs Muscle Mass (lbs): 132 lbs Visceral Fat Rating : 22    No data recorded Today's Visit #: 17  Starting Date: 03/14/22   Subjective   Chief Complaint: Obesity  Interval History Discussed the use of AI scribe software for clinical note transcription with the patient, who gave verbal consent to proceed.  History of Present Illness   She is a 53 year old female presenting for medical weight management.  She has experienced weight gain since her last office visit. She follows a category three meal plan and tracks her calories 70% of the time. She ensures adequate protein intake and does not skip meals. Her exercise routine is limited to one day a week for about five minutes. She is trying to prevent illness and mentions a 'tickle in her throat.'  She has been off Ozempic for about a month due to insurance coverage issues. After discontinuing Ozempic, she noticed increased sugar cravings, although her appetite remained unchanged. She also experienced a change in mood. Despite these changes, she continues to monitor her calorie intake and has increased her water consumption.  She is awaiting a second sleep study, which was supposed to be conducted at home. The last contact regarding this was in January, and she plans to follow up to expedite the process. She did not bring the results from the previous sleep study but intends to email them.       Challenges affecting patient  progress: strong hunger signals and/or impaired satiety / inhibitory control, having difficulty preparing healthy meals, difficulty implementing reduced calorie nutrition plan, low volume of physical activity at present , presence of obesogenic drugs, and inadequate sleep.    Pharmacotherapy for weight management: She is currently taking  was on Ozempic for diabetes prevention medication is no longer covered by her insurance and is cost prohibitive .   Assessment and Plan   Treatment Plan For Obesity:  Recommended Dietary Goals  Keegan is currently in the action stage of change. As such, her goal is to continue weight management plan. She has agreed to: continue current plan  Behavioral Health and Counseling  We discussed the following behavioral modification strategies today: continue to work on maintaining a reduced calorie state, getting the recommended amount of protein, incorporating whole foods, making healthy choices, staying well hydrated and practicing mindfulness when eating..  Additional education and resources provided today: None  Recommended Physical Activity Goals  Jeilani has been advised to work up to 150 minutes of moderate intensity aerobic activity a week and strengthening exercises 2-3 times per week for cardiovascular health, weight loss maintenance and preservation of muscle mass.   She has agreed to :  Think about enjoyable ways to increase daily physical activity and overcoming barriers to exercise and Increase physical activity in their day and reduce sedentary time (increase NEAT).  Pharmacotherapy  We discussed various medication options to help Donyetta with her weight loss efforts and we both agreed to :  start anti-obesity medication.  In addition to reduced calorie nutrition plan (RCNP), behavioral strategies and physical activity, Annalise would benefit from pharmacotherapy to assist with hunger signals, satiety and cravings. This will reduce  obesity-related health risks by inducing weight loss, and help reduce food consumption and adherence to [facility]) . It may also improve QOL by improving self-confidence and reduce the  setbacks associated with metabolic adaptations.  Her insurance no longer covers GLP-1 and is cost prohibitive.  After discussion of treatment options, mechanisms of action, benefits, side effects, contraindications and shared decision making she is agreeable to starting Lomaira 8 mg 1 tablet in the morning.  She will continue on topiramate as prescribed by her neurologist.  Patient also made aware that medication is indicated for long-term management of obesity and the risk of weight regain following discontinuation of treatment and hence the importance of adhering to medical weight loss plan.  We demonstrated use of device and patient using teach back method was able to demonstrate proper technique.  We reviewed and signed controlled substance agreement We reviewed state registry for controlled substances, only medication found was gabapentin  Associated Conditions Impacted by Obesity Treatment  Insulin resistance Assessment & Plan: Her HOMA-IR is 10.78 which is markedly elevated. Optimal level < 1.9.   This is complex condition associated with genetics, ectopic fat and lifestyle factors. Insulin resistance may result in increased fat storage, inhibition of the breakdown of fat, cause fluctuations in blood sugar leading to energy crashes and increased cravings for sugary or high carb foods and cause metabolic slowdown making it difficult to lose weight.  This may result in additional weight gain and lead to pre-diabetes and diabetes if untreated. In addition, hyperinsulinemia increases cardiovascular risk, chronic inflammatory response and may increase the risk of obesity related malignancies.  She will be again prescribed Ozempic 1 mg once a week.  She does not have coverage for Wegovy or Zepbound we reviewed patient  formulary with her today.  Lab Results  Component Value Date   HGBA1C 5.8 04/04/2023   Lab Results  Component Value Date   INSULIN 41.6 (H) 03/14/2022   INSULIN 16.1 07/28/2019   INSULIN 7.4 12/08/2018   Lab Results  Component Value Date   GLUCOSE 105 (H) 07/23/2022   GLUCOSE 83 12/03/2017    Patient is intolerant to metformin and her insurance no longer covers Ozempic.  She will continue to work on reducing simple and added sugars in her diet and continue current weight management strategy    Prediabetes Assessment & Plan: She has had a progressive increase of hemoglobin A1c now at 6.3 with very high insulin levels.  She had been on metformin in the past but medication was discontinued because of increase in bicarbonate while also taking topiramate.  Her insurance no longer covers GLP-1 so she does not have anything to take for pharmacoprophylaxis she will continue to work on nutrition and behavioral strategies for diabetes prevention.  She is certainly high risk for progression   OSA (obstructive sleep apnea) Assessment & Plan: Had a sleep study but never started CPAP. Undergoing repeat sleep for unknown reasons.  I encouraged her to reach out to sleep specialist to read study to see if she could obtain results.    Class 3 severe obesity without serious comorbidity with body mass index (BMI) of 50.0 to 59.9 in adult, unspecified obesity type [facility]) Assessment & Plan: Weight gain since last visit despite adherence to a category three meal plan and 70% calorie  tracking. Exercises once weekly for five minutes. Discontinued Ozempic one month ago due to insurance issues, resulting in increased sugar cravings and mood changes. Lomaira (phentermine) discussed as an alternative for appetite suppression. Lomaira is a stimulant with risks of increased blood pressure, heart rate, and potential dependence, though low. May cause dry mouth and should not disrupt sleep. Informed about using  the patient portal for insurance coverage communication and [employer] app for discounts if insurance does not cover Lomaira.  - Prescribe Lomaira (phentermine) 8 mg, to be taken on an empty stomach 30 minutes before breakfast. - Instruct to use [employer] app for potential discounts if insurance does not cover Lomaira. - Continue topiramate for migraines. - Discuss with PCP about potential bladder ultrasound if urinary symptoms persist. - Follow up in six weeks to assess response to medication.  Orders: -     Lomaira; Take 1 tablet (8 mg total) by mouth daily.  Dispense: 28 tablet; Refill: 1          Objective   Physical Exam:  Blood pressure 132/84, pulse 88, temperature 98 F (36.7 C), height 5\' 5"  (1.651 m), weight (!) 322 lb (146.1 kg), SpO2 100%. Body mass index is 53.58 kg/m.  General: She is overweight, cooperative, alert, well developed, and in no acute distress. PSYCH: Has normal mood, affect and thought process.   HEENT: EOMI, sclerae are anicteric. Lungs: Normal breathing effort, no conversational dyspnea. Extremities: No edema.  Neurologic: No gross sensory or motor deficits. No tremors or fasciculations noted.    Diagnostic Data Reviewed:  BMET    Component Value Date/Time   NA 140 07/23/2022 1213   K 4.7 07/23/2022 1213   CL 108 (H) 07/23/2022 1213   CO2 19 (L) 07/23/2022 1213   GLUCOSE 105 (H) 07/23/2022 1213   GLUCOSE 118 (H) 12/20/2021 1430   BUN 8 07/23/2022 1213   CREATININE 0.89 07/23/2022 1213   CALCIUM 9.0 07/23/2022 1213   GFRNONAA 55 (L) 12/20/2021 1430   GFRAA >60 09/25/2019 1548   Lab Results  Component Value Date   HGBA1C 5.8 04/04/2023   HGBA1C 5.6 12/03/2017   Lab Results  Component Value Date   INSULIN 41.6 (H) 03/14/2022   INSULIN 7.4 12/08/2018   Lab Results  Component Value Date   TSH 1.655 12/20/2021   CBC    Component Value Date/Time   WBC 8.1 12/20/2021 1430   RBC 4.57 12/20/2021 1430   HGB 12.5 12/20/2021 1430   HGB  12.5 01/23/2021 1525   HCT 38.9 12/20/2021 1430   HCT 37.4 01/23/2021 1525   PLT 333 12/20/2021 1430   PLT 284 01/23/2021 1525   MCV 85.1 12/20/2021 1430   MCV 88 01/23/2021 1525   MCH 27.4 12/20/2021 1430   MCHC 32.1 12/20/2021 1430   RDW 14.6 12/20/2021 1430   RDW 15.1 01/23/2021 1525   Iron Studies    Component Value Date/Time   IRON 53 04/02/2019 1510   TIBC 360 04/02/2019 1510   FERRITIN 26 04/02/2019 1510   IRONPCTSAT 15 04/02/2019 1510   Lipid Panel     Component Value Date/Time   CHOL 140 03/14/2022 1030   TRIG 68 03/14/2022 1030   HDL 46 03/14/2022 1030   CHOLHDL 3.8 10/06/2020 1458   LDLCALC 80 03/14/2022 1030   Hepatic Function Panel     Component Value Date/Time   PROT 6.8 07/23/2022 1213   ALBUMIN 3.9 07/23/2022 1213   AST 24 07/23/2022 1213   ALT 28 07/23/2022  1213   ALKPHOS 81 07/23/2022 1213   BILITOT 0.4 07/23/2022 1213      Component Value Date/Time   TSH 1.655 12/20/2021 1430   TSH 1.430 01/23/2021 1525   Nutritional Lab Results  Component Value Date   VD25OH 28.3 (L) 07/23/2022   VD25OH 14.6 (L) 03/14/2022   VD25OH 14.8 (L) 01/23/2021    Medications: Outpatient Encounter Medications as of 06/26/2023  Medication Sig   diclofenac (VOLTAREN) 75 MG EC tablet Take 1 tablet (75 mg total) by mouth 2 (two) times daily as needed.   DULoxetine (CYMBALTA) 60 MG capsule Take 1 capsule (60 mg total) by mouth daily for back pain and mood.Must have office visit for refills   ferrous sulfate 325 (65 FE) MG tablet Take 325 mg by mouth every Wednesday.   furosemide (LASIX) 20 MG tablet Take 1 tablet (20 mg total) by mouth as needed for edema or fluid.   gabapentin (NEURONTIN) 300 MG capsule Take 1 capsule (300 mg total) by mouth daily as needed.   hydrOXYzine (ATARAX) 25 MG tablet TAKE 1 TO 2 TABLETS BY MOUTH EVERY 6 TO 8 HOURS AS NEEDED FOR ITCHING   ondansetron (ZOFRAN) 4 MG tablet Take 1 tablet (4 mg total) by mouth every 8 (eight) hours as needed for  nausea or vomiting.   Phentermine HCl (LOMAIRA) 8 MG TABS Take 1 tablet (8 mg total) by mouth daily.   rOPINIRole (REQUIP) 0.5 MG tablet Take 1 tablet (0.5 mg total) by mouth at bedtime.   tiZANidine (ZANAFLEX) 4 MG tablet Take 1 tablet (4 mg total) by mouth every 8 (eight) hours as needed for muscle spasms.   topiramate (TOPAMAX) 25 MG tablet Take 3 tablets (75 mg total) by mouth at bedtime.   Vitamin D, Ergocalciferol, (DRISDOL) 1.25 MG (50000 UNIT) CAPS capsule Take 1 capsule (50,000 Units total) by mouth every 7 (seven) days.   [DISCONTINUED] Semaglutide, 1 MG/DOSE, 4 MG/3ML SOPN Inject 1 mg as directed once a week.   No facility-administered encounter medications on file as of 06/26/2023.     Follow-Up   Return in about 6 weeks (around 08/07/2023) for For Weight Mangement with Dr. Allie Area.Aaron Aas She was informed of the importance of frequent follow up visits to maximize her success with intensive lifestyle modifications for her multiple health conditions.  Attestation Statement   Reviewed by clinician on day of visit: allergies, medications, problem list, medical history, surgical history, family history, social history, and previous encounter notes.     Ladd Picker, MD

## 2023-06-27 ENCOUNTER — Encounter: Payer: Self-pay | Admitting: Gastroenterology

## 2023-07-01 ENCOUNTER — Telehealth: Payer: Self-pay

## 2023-07-01 DIAGNOSIS — R0683 Snoring: Secondary | ICD-10-CM

## 2023-07-01 NOTE — Telephone Encounter (Signed)
 Copied from CRM [id number]. Topic: Referral - Status >> Jul 01, 2023 10:28 AM Zipporah Him wrote: Reason for CRM: Patient states [facility] [phone], does not have a referral for her. Please follow up with patient. You can call patient or message on mychart.

## 2023-07-02 NOTE — Telephone Encounter (Signed)
 New order entered. Please let patient know what happened and that new order has been placed.

## 2023-07-02 NOTE — Telephone Encounter (Signed)
Called patient but no answer. LVM to call back.

## 2023-07-02 NOTE — Telephone Encounter (Signed)
 Called & spoke to the patient. Verified name & DOB. Informed that a new referral has been placed to the sleep center due to previous referral being accidentally changed to "completed. Informed patient that there may still be a delay of 4-6 weeks due to prior authorization process and insurance company but will receive a call when ready to be scheduled. Patient expressed verbal understanding of all discussed.

## 2023-07-02 NOTE — Addendum Note (Signed)
 Addended by: Concetta Dee B on: 07/02/2023 06:40 AM   Modules accepted: Orders

## 2023-07-02 NOTE — Telephone Encounter (Addendum)
 Patient is calling in with [facility] returning the call back.

## 2023-07-02 NOTE — Telephone Encounter (Unsigned)
 Copied from CRM [id number]. Topic: General - Other >> Jul 02, 2023  1:17 PM Lynnie Saucier S wrote: Reason for CRM: Patient is returning a call from [place]. Callback number is [phone]

## 2023-07-06 ENCOUNTER — Encounter (INDEPENDENT_AMBULATORY_CARE_PROVIDER_SITE_OTHER): Payer: Self-pay | Admitting: Internal Medicine

## 2023-07-08 ENCOUNTER — Ambulatory Visit (INDEPENDENT_AMBULATORY_CARE_PROVIDER_SITE_OTHER): Admitting: Sports Medicine

## 2023-07-08 ENCOUNTER — Other Ambulatory Visit: Payer: Self-pay

## 2023-07-08 ENCOUNTER — Encounter: Payer: Self-pay | Admitting: Sports Medicine

## 2023-07-08 DIAGNOSIS — M19011 Primary osteoarthritis, right shoulder: Secondary | ICD-10-CM

## 2023-07-08 DIAGNOSIS — G8929 Other chronic pain: Secondary | ICD-10-CM

## 2023-07-08 DIAGNOSIS — M75101 Unspecified rotator cuff tear or rupture of right shoulder, not specified as traumatic: Secondary | ICD-10-CM | POA: Diagnosis not present

## 2023-07-08 DIAGNOSIS — M25511 Pain in right shoulder: Secondary | ICD-10-CM

## 2023-07-08 DIAGNOSIS — S46811A Strain of other muscles, fascia and tendons at shoulder and upper arm level, right arm, initial encounter: Secondary | ICD-10-CM

## 2023-07-08 DIAGNOSIS — E88819 Insulin resistance, unspecified: Secondary | ICD-10-CM

## 2023-07-08 NOTE — Progress Notes (Unsigned)
 Jenna [name] - 53 y.o. female MRN [id number]  Date of birth: 02-Sep-1970  Office Visit Note: Visit Date: 07/08/2023 PCP: Lawrance Presume, MD Referred by: Lawrance Presume, MD  Subjective: No chief complaint on file.  HPI: Jenna [name] is a pleasant 53 y.o. female who presents today for chronic right shoulder pain.  ***  Pertinent ROS were reviewed with the patient and found to be negative unless otherwise specified above in HPI.   Assessment & Plan: Visit Diagnoses: No diagnosis found.  Plan: ***  Follow-up: No follow-ups on file.   Meds & Orders: No orders of the defined types were placed in this encounter.  No orders of the defined types were placed in this encounter.    Procedures: No procedures performed      Clinical History: No specialty comments available.  She reports that she quit smoking about 20 months ago. Her smoking use included cigarettes. She has never used smokeless tobacco.  Recent Labs    04/04/23 1100  HGBA1C 5.8    Objective:   Vital Signs: There were no vitals taken for this visit.  Physical Exam  Gen: Well-appearing, in no acute distress; non-toxic CV: Well-perfused. Warm.  Resp: Breathing unlabored on room air; no wheezing. Psych: Fluid speech in conversation; appropriate affect; normal thought process  Ortho Exam - ***  Imaging: No results found.  Past Medical/Family/Surgical/Social History: Medications & Allergies reviewed per EMR, new medications updated. Patient Active Problem List   Diagnosis Date Noted  . History of colonic polyps 06/20/2023  . Adenomatous polyp of ascending colon 06/20/2023  . Weight gain due to medication 05/27/2023  . Elevated blood pressure reading without diagnosis of hypertension 04/02/2023  . Abnormal food appetite 04/02/2023  . OSA (obstructive sleep apnea) 11/08/2022  . Bilateral leg edema 07/23/2022  . Benign neoplasm of ascending colon 07/19/2022  . Acute kidney injury (HCC)  03/28/2022  . Prediabetes 03/28/2022  . Other spondylosis with radiculopathy, cervical region 10/03/2021  . Fecal occult blood test positive 07/15/2021  . Periodic limb movement 07/11/2021  . Loud snoring 01/23/2021  . Severe obesity (BMI >= 40) (HCC) 02/08/2020  . Insulin  resistance 07/28/2019  . Depression 07/28/2019  . Vitamin D  deficiency 12/09/2018  . Class 3 severe obesity without serious comorbidity with body mass index (BMI) of 50.0 to 59.9 in adult (HCC) 01/17/2017  . Low back pain 05/17/2016  . Unilateral primary osteoarthritis, left knee 05/08/2016  . Primary osteoarthritis of both knees 06/27/2015  . Closed right fibular fracture 08/10/2014   Past Medical History:  Diagnosis Date  . Arthritis   . Back pain   . Back pain   . Chest pain   . Depression   . Edema of both lower extremities   . Joint pain   . Left arm numbness   . Numbness in both hands   . Osteoarthritis   . Prediabetes   . SOB (shortness of breath)   . Vitamin D  deficiency    Family History  Problem Relation Age of Onset  . Colon polyps Mother   . Diabetes Mother   . Hypertension Mother   . Hypertension Father   . Breast cancer Neg Hx   . Colon cancer Neg Hx   . Esophageal cancer Neg Hx   . Rectal cancer Neg Hx   . Stomach cancer Neg Hx    Past Surgical History:  Procedure Laterality Date  . CHOLECYSTECTOMY    . COLONOSCOPY    . COLONOSCOPY WITH  PROPOFOL  N/A 07/19/2022   Procedure: COLONOSCOPY WITH PROPOFOL ;  Surgeon: Elois Hair, MD;  Location: [facility] ENDOSCOPY;  Service: Gastroenterology;  Laterality: N/A;  . COLONOSCOPY WITH PROPOFOL  N/A 10/25/2022   Procedure: COLONOSCOPY WITH PROPOFOL ;  Surgeon: Brice Campi Albino Alu., MD;  Location: [facility] ENDOSCOPY;  Service: Gastroenterology;  Laterality: N/A;  . COLONOSCOPY WITH PROPOFOL  N/A 06/20/2023   Procedure: COLONOSCOPY WITH PROPOFOL ;  Surgeon: Brice Campi Albino Alu., MD;  Location: [facility] ENDOSCOPY;  Service: Gastroenterology;  Laterality:  N/A;  . ENDOSCOPIC MUCOSAL RESECTION N/A 10/25/2022   Procedure: ENDOSCOPIC MUCOSAL RESECTION;  Surgeon: Brice Campi Albino Alu., MD;  Location: [facility] ENDOSCOPY;  Service: Gastroenterology;  Laterality: N/A;  . HEMOSTASIS CLIP PLACEMENT  10/25/2022   Procedure: HEMOSTASIS CLIP PLACEMENT;  Surgeon: Normie Becton., MD;  Location: [facility] ENDOSCOPY;  Service: Gastroenterology;;  . HOT HEMOSTASIS N/A 10/25/2022   Procedure: HOT HEMOSTASIS (ARGON PLASMA COAGULATION/BICAP);  Surgeon: Normie Becton., MD;  Location: [facility];  Service: Gastroenterology;  Laterality: N/A;  . POLYPECTOMY  07/19/2022   Procedure: POLYPECTOMY;  Surgeon: Elois Hair, MD;  Location: [facility] ENDOSCOPY;  Service: Gastroenterology;;  . POLYPECTOMY  06/20/2023   Procedure: POLYPECTOMY, INTESTINE;  Surgeon: Normie Becton., MD;  Location: [facility]OSCOPY;  Service: Gastroenterology;;  . [place] INJECTION  06/20/2023   Procedure: INJECTION, SUBMUCOSAL;  Surgeon: Normie Becton., MD;  Location: [facility] ENDOSCOPY;  Service: Gastroenterology;;  . [place] LIFTING INJECTION  07/19/2022   Procedure: SUBMUCOSAL LIFTING INJECTION;  Surgeon: Elois Hair, MD;  Location: [facility] ENDOSCOPY;  Service: Gastroenterology;;  . [place] LIFTING INJECTION  10/25/2022   Procedure: SUBMUCOSAL LIFTING INJECTION;  Surgeon: Normie Becton., MD;  Location: [facility];  Service: Gastroenterology;;  . [place] TATTOO INJECTION  10/25/2022   Procedure: SUBMUCOSAL TATTOO INJECTION;  Surgeon: Normie Becton., MD;  Location: [facility] ENDOSCOPY;  Service: Gastroenterology;;  . TUBAL LIGATION     Social History   Occupational History  . Occupation: Stay at home, disabled  Tobacco Use  . Smoking status: Former    Current packs/day: 0.00    Types: Cigarettes    Quit date: 10/10/2021    Years since quitting: 1.7  . Smokeless tobacco: Never  Vaping Use  . Vaping status: Never Used  Substance and Sexual  Activity  . Alcohol use: No    Alcohol/week: 0.0 standard drinks of alcohol  . Drug use: No  . Sexual activity: Not Currently

## 2023-07-08 NOTE — Progress Notes (Unsigned)
 Patient says that she has had right shoulder pain since December/January that has gotten progressively worse. She says that recently her pain has moved into the top of the shoulders/upper trapezius and the right side of her neck, causing headaches. She has an audible pop in the right shoulder which she describes as painful but also relieving. She was taking medication prescribed by Dr. Lucienne Ryder which did help her pain, but was advised by her weight doctor to stop taking this for 3 weeks; she has noticed increased pain without the medication.

## 2023-07-09 ENCOUNTER — Encounter: Payer: Self-pay | Admitting: Sports Medicine

## 2023-07-10 ENCOUNTER — Ambulatory Visit: Attending: Orthopaedic Surgery | Admitting: Physical Therapy

## 2023-07-10 DIAGNOSIS — G8929 Other chronic pain: Secondary | ICD-10-CM | POA: Diagnosis present

## 2023-07-10 DIAGNOSIS — M25611 Stiffness of right shoulder, not elsewhere classified: Secondary | ICD-10-CM | POA: Diagnosis present

## 2023-07-10 DIAGNOSIS — M25511 Pain in right shoulder: Secondary | ICD-10-CM | POA: Diagnosis present

## 2023-07-10 NOTE — Therapy (Signed)
 OUTPATIENT PHYSICAL THERAPY SHOULDER NOTE /RENEWAL    Patient Name: Jenna [name] MRN: [id number] DOB:January 05, 1971, 53 y.o., female Today's Date: 07/10/2023  END OF SESSION:  PT End of Session - 07/10/23 1332     Visit Number 2    Number of Visits 13    Date for PT Re-Evaluation 08/21/23    Authorization Type [employer]    Authorization - Number of Visits 2    PT Start Time 1330    PT Stop Time 1412    PT Time Calculation (min) 42 min    Activity Tolerance Patient tolerated treatment well;Patient limited by pain    Behavior During Therapy WFL for tasks assessed/performed              Past Medical History:  Diagnosis Date   Arthritis    Back pain    Back pain    Chest pain    Depression    Edema of both lower extremities    Joint pain    Left arm numbness    Numbness in both hands    Osteoarthritis    Prediabetes    SOB (shortness of breath)    Vitamin D  deficiency    Past Surgical History:  Procedure Laterality Date   CHOLECYSTECTOMY     COLONOSCOPY     COLONOSCOPY WITH PROPOFOL  N/A 07/19/2022   Procedure: COLONOSCOPY WITH PROPOFOL ;  Surgeon: Elois Hair, MD;  Location: [facility] ENDOSCOPY;  Service: Gastroenterology;  Laterality: N/A;   COLONOSCOPY WITH PROPOFOL  N/A 10/25/2022   Procedure: COLONOSCOPY WITH PROPOFOL ;  Surgeon: Mansouraty, Albino Alu., MD;  Location: [facility] ENDOSCOPY;  Service: Gastroenterology;  Laterality: N/A;   COLONOSCOPY WITH PROPOFOL  N/A 06/20/2023   Procedure: COLONOSCOPY WITH PROPOFOL ;  Surgeon: Mansouraty, Albino Alu., MD;  Location: [facility] ENDOSCOPY;  Service: Gastroenterology;  Laterality: N/A;   ENDOSCOPIC MUCOSAL RESECTION N/A 10/25/2022   Procedure: ENDOSCOPIC MUCOSAL RESECTION;  Surgeon: Brice Campi Albino Alu., MD;  Location: [facility] ENDOSCOPY;  Service: Gastroenterology;  Laterality: N/A;   HEMOSTASIS CLIP PLACEMENT  10/25/2022   Procedure: HEMOSTASIS CLIP PLACEMENT;  Surgeon: Normie Becton., MD;  Location: [facility]  ENDOSCOPY;  Service: Gastroenterology;;   HOT HEMOSTASIS N/A 10/25/2022   Procedure: HOT HEMOSTASIS (ARGON PLASMA COAGULATION/BICAP);  Surgeon: Normie Becton., MD;  Location: [facility] ENDOSCOPY;  Service: Gastroenterology;  Laterality: N/A;   POLYPECTOMY  07/19/2022   Procedure: POLYPECTOMY;  Surgeon: Elois Hair, MD;  Location: [facility] ENDOSCOPY;  Service: Gastroenterology;;   POLYPECTOMY  06/20/2023   Procedure: POLYPECTOMY, INTESTINE;  Surgeon: Normie Becton., MD;  Location: [facility] ENDOSCOPY;  Service: Gastroenterology;;   SUBMUCOSAL INJECTION  06/20/2023   Procedure: INJECTION, SUBMUCOSAL;  Surgeon: Normie Becton., MD;  Location: [facility] ENDOSCOPY;  Service: Gastroenterology;;   SUBMUCOSAL LIFTING INJECTION  07/19/2022   Procedure: SUBMUCOSAL LIFTING INJECTION;  Surgeon: Elois Hair, MD;  Location: [facility] ENDOSCOPY;  Service: Gastroenterology;;   SUBMUCOSAL LIFTING INJECTION  10/25/2022   Procedure: SUBMUCOSAL LIFTING INJECTION;  Surgeon: Normie Becton., MD;  Location: [facility] ENDOSCOPY;  Service: Gastroenterology;;   SUBMUCOSAL TATTOO INJECTION  10/25/2022   Procedure: SUBMUCOSAL TATTOO INJECTION;  Surgeon: Normie Becton., MD;  Location: [facility] ENDOSCOPY;  Service: Gastroenterology;;   TUBAL LIGATION     Patient Active Problem List   Diagnosis Date Noted   History of colonic polyps 06/20/2023   Adenomatous polyp of ascending colon 06/20/2023   Weight gain due to medication 05/27/2023   Elevated blood pressure reading without diagnosis of hypertension 04/02/2023   Abnormal  food appetite 04/02/2023   OSA (obstructive sleep apnea) 11/08/2022   Bilateral leg edema 07/23/2022   Benign neoplasm of ascending colon 07/19/2022   Acute kidney injury (HCC) 03/28/2022   Prediabetes 03/28/2022   Other spondylosis with radiculopathy, cervical region 10/03/2021   Fecal occult blood test positive 07/15/2021   Periodic limb movement 07/11/2021   Loud snoring 01/23/2021    Severe obesity (BMI >= 40) (HCC) 02/08/2020   Insulin  resistance 07/28/2019   Depression 07/28/2019   Vitamin D  deficiency 12/09/2018   Class 3 severe obesity without serious comorbidity with body mass index (BMI) of 50.0 to 59.9 in adult (HCC) 01/17/2017   Low back pain 05/17/2016   Unilateral primary osteoarthritis, left knee 05/08/2016   Primary osteoarthritis of both knees 06/27/2015   Closed right fibular fracture 08/10/2014    PCP: Lawrance Presume, MD   REFERRING PROVIDER: Arnie Lao, MD   REFERRING DIAG: M25.511 (ICD-10-CM) - Acute pain of right shoulder   THERAPY DIAG:  Chronic right shoulder pain - Plan: PT plan of care cert/re-cert  Stiffness of right shoulder, not elsewhere classified - Plan: PT plan of care cert/re-cert  Rationale for Evaluation and Treatment: Rehabilitation  ONSET DATE: 1.5 months  SUBJECTIVE:                                                                                                                                                                                      SUBJECTIVE STATEMENT: Patient had to put herself on hold. She had to take care of her mom. She saw Dr. Vaughn Georges who said she has a torn ligament in her shoulder and AC OA. See note.  Pain is constant.   No precipitating incident. Experiences popping and her R shoulder will get stuck.  Hand dominance: Right  PERTINENT HISTORY: OA, sciatic nerve/back pain   PAIN:  Are you having pain? Yes: NPRS scale: ave 6/10-7/10 Pain location: Superior R GH and lateral shoulder Pain description: Ache and sharp Aggravating factors: reaching in multiple directions Relieving factors: Pulling R arm across body and Voltaren  , heat   PRECAUTIONS: None  RED FLAGS: None   WEIGHT BEARING RESTRICTIONS: No  FALLS:  Has patient fallen in last 6 months? No  LIVING ENVIRONMENT: Lives with: lives with an adult companion Lives in:  House/apartment   OCCUPATION: Disability  PLOF: Independent  PATIENT GOALS:Pain relief  NEXT MD VISIT:   OBJECTIVE:  Note: Objective measures were completed at Evaluation unless otherwise noted.  DIAGNOSTIC FINDINGS:  XR R shoulder 05/08/23 3 views of the right shoulder send no acute findings.  The subacromial  outlet is reduced and there is significant AC joint  arthritic changes.   The glenohumeral joint is well-maintained.   Msk US  07/08/23 The AC joint was visualized with notable arthritic spurring and a small  effusion present.  The biceps tendon was visualized within the bicipital  groove both in longitudinal and transverse axis with some tendinopathy but  no evidence of high-grade tearing.  The subscapularis tendon was evaluated  in both static and dynamic motion without significant high-grade tearing.   The supraspinatus was not evaluated both over anterior, posterior and  interval views with evidence of articular sided partial tearing near the  insertion.  There is no low-grade tearing or tendon retraction.  The  infraspinatus was seen with evidence of high-grade partial tearing with  hypoechoic fluid both superior and inferior to the tendon indicative of a  small effusion from the likely underlying tear near the crossover of the  infraspinatus and supraspinatus critical zone.  The teres minor was seen  without notable pathology.  The posterior glenohumeral joint was  visualized without notable effusion or joint abnormality.   PATIENT SURVEYS:  Quick Dash 40/55=66% disability  COGNITION: Overall cognitive status: Within functional limits for tasks assessed     SENSATION: WFL  POSTURE: Forward head, rounded shoulders  UPPER EXTREMITY ROM:   Active ROM Right eval Left eval Rt 07/10/23  Shoulder flexion 110 P 135 145 112  Shoulder extension     Shoulder abduction   76  Shoulder adduction     Shoulder internal rotation L4 T8 Mid lumbar   Shoulder external  rotation C7 T3 Side of neck   Elbow flexion     Elbow extension     Wrist flexion     Wrist extension     Wrist ulnar deviation     Wrist radial deviation     Wrist pronation     Wrist supination      (Blank rows = not tested)  UPPER EXTREMITY MMT:  R shoulder strength was limited with report of pain MMT Right eval Left eval Rt. 07/10/23  Shoulder flexion P  3-/5  Shoulder extension P    Shoulder abduction P  3-/5 P   Shoulder adduction P    Shoulder internal rotation P  4+/5  Shoulder external rotation P  4/5 Pain   Middle trapezius     Lower trapezius     Elbow flexion     Elbow extension     Wrist flexion     Wrist extension     Wrist ulnar deviation     Wrist radial deviation     Wrist pronation     Wrist supination     Grip strength (lbs)     (Blank rows = not tested)  SHOULDER SPECIAL TESTS: Impingement tests: Hawkins/Kennedy impingement test: negative SLAP lesions:  NT Instability tests:  NT Rotator cuff assessment: Empty can test: negative and Full can test: negative Biceps assessment: Yergason's test: negative  JOINT MOBILITY TESTING:  Appropriate GH jt stability  PALPATION:  TTP AC jt  Able to lift elbo  TREATMENT DATE:   [facility] Adult PT Treatment:                                                DATE: 07/10/23 Therapeutic Activity: Supine AAROM with dowel: flexion, chest press, ER Table slides scaption  ER and IR standing GTB Row and extension in standing GTB Self Care: Avoid overhead exercises for now Rotator cuff anatomy, caution, options for conservative treatment ,benefits of regular physical therapy in the right exercises in order to heal the injury  [facility] Adult PT Treatment:                                                DATE: 05/22/23 Therapeutic Exercise: Developed, instructed in, and pt completed therex as noted in HEP   Self Care: Instruction in sleeping position and pillow support for comfort   PATIENT EDUCATION: Education details: Eval findings, POC, HEP, self care Person educated: Patient Education method: Explanation, Demonstration, Tactile cues, Verbal cues, and Handouts Education comprehension: verbalized understanding, returned demonstration, verbal cues required, and tactile cues required  HOME EXERCISE PROGRAM: Access Code: [email or web address] URL: [email or web address] Date: 05/22/2023 Prepared by: Liborio Reeds  Exercises - Flexion-Extension Shoulder Pendulum with Table Support  - 3 x daily - 7 x weekly - 1 sets - 20 reps - Shoulder Extension with Resistance  - 1 x daily - 7 x weekly - 3 sets - 10 reps - 2 hold  ASSESSMENT:  CLINICAL IMPRESSION: Patient returns for PT treatment , 1st since the eval.  She does have a confirmed partial tear of the supraspinatus and AC joint OA. She had some personal family issues but didn't realize it had been that long.  She would like to be able to prevent surgery and is not open to a steriod injection.  She continues to have pain and weakness in Rt UE (dominant UE) which limits her in ADLs, sleep and driving.  I gave her QuickDASH today to reassess her function as well as an updated home ramp she is coming in tomorrow so we will see how she does.     OBJECTIVE IMPAIRMENTS: decreased activity tolerance, decreased ROM, decreased strength, impaired UE functional use, obesity, and pain.   ACTIVITY LIMITATIONS: carrying, lifting, sleeping, reach over head, and caring for others  PARTICIPATION LIMITATIONS: meal prep, cleaning, laundry, and driving  PERSONAL FACTORS: Fitness, Past/current experiences, Social background, and 1 comorbidity: OA  are also affecting patient's functional outcome.   REHAB POTENTIAL: Good  CLINICAL DECISION MAKING: Stable/uncomplicated  EVALUATION COMPLEXITY: Low   GOALS:  SHORT TERM GOALS=LTGs  LONG TERM GOALS: Target  date: 07/12/23  Pt will be Ind in a final HEP to maintain achieved LOF  Baseline: started Goal status: ongoing   2.  Increase R shoulder AROM to flex 125d, ER T2, IR L2 for improved functional use Baseline: See flow sheets Goal status:ongoing   3.  Pt will demonstrate OH reach c 2# for functional use with household activities Baseline: Limited Goal status: ongoing   4.  Pt will report 50% or greater decrease in R shoulder pain for improved function and QOL Baseline: 0-9/10 Goal status: ongoing   5.  Pt's Quick Dash score will improve by the MCID to  50% as indication of improved function  Baseline: 66% disability Goal status: INITIAL PLAN:  PT FREQUENCY: 2x/week  PT DURATION: 6 weeks  PLANNED INTERVENTIONS: 97164- PT Re-evaluation, 97110-Therapeutic exercises, 97530- Therapeutic activity, 97535- Self Care, 16109- Manual therapy, [id number]- Aquatic Therapy, [id number]- Electrical stimulation (unattended), [phone]- Ionotophoresis 4mg /ml Dexamethasone , Patient/Family education, Taping, Dry Needling, Joint mobilization, Cryotherapy, and Moist heat  PLAN FOR NEXT SESSION:  DASH! Extended POC for 6 min weeks.  AAROM and strength as tolerated. Assess response to HEP; progress therex as indicated; use of modalities, manual therapy; and TPDN as indicated.   Marci Setter, PT 07/10/23 2:16 PM Phone: [phone] Fax: [phone]

## 2023-07-10 NOTE — Therapy (Addendum)
 OUTPATIENT PHYSICAL THERAPY SHOULDER NOTE /RENEWAL    Patient Name: Jenna [name] MRN: [id number] DOB:September 21, 1970, 53 y.o., female Today's Date: 07/11/2023  END OF SESSION:  PT End of Session - 07/11/23 0811     Visit Number 3    Number of Visits 13    Date for PT Re-Evaluation 08/21/23    Authorization Type [employer]    Authorization - Visit Number 3    Authorization - Number of Visits 27    PT Start Time 0805    PT Stop Time 0845    PT Time Calculation (min) 40 min    Activity Tolerance Patient tolerated treatment well;Patient limited by pain    Behavior During Therapy WFL for tasks assessed/performed               Past Medical History:  Diagnosis Date   Arthritis    Back pain    Back pain    Chest pain    Depression    Edema of both lower extremities    Joint pain    Left arm numbness    Numbness in both hands    Osteoarthritis    Prediabetes    SOB (shortness of breath)    Vitamin D  deficiency    Past Surgical History:  Procedure Laterality Date   CHOLECYSTECTOMY     COLONOSCOPY     COLONOSCOPY WITH PROPOFOL  N/A 07/19/2022   Procedure: COLONOSCOPY WITH PROPOFOL ;  Surgeon: Elois Hair, MD;  Location: [facility] ENDOSCOPY;  Service: Gastroenterology;  Laterality: N/A;   COLONOSCOPY WITH PROPOFOL  N/A 10/25/2022   Procedure: COLONOSCOPY WITH PROPOFOL ;  Surgeon: Mansouraty, Albino Alu., MD;  Location: [facility] ENDOSCOPY;  Service: Gastroenterology;  Laterality: N/A;   COLONOSCOPY WITH PROPOFOL  N/A 06/20/2023   Procedure: COLONOSCOPY WITH PROPOFOL ;  Surgeon: Mansouraty, Albino Alu., MD;  Location: [facility] ENDOSCOPY;  Service: Gastroenterology;  Laterality: N/A;   ENDOSCOPIC MUCOSAL RESECTION N/A 10/25/2022   Procedure: ENDOSCOPIC MUCOSAL RESECTION;  Surgeon: Brice Campi Albino Alu., MD;  Location: [facility] ENDOSCOPY;  Service: Gastroenterology;  Laterality: N/A;   HEMOSTASIS CLIP PLACEMENT  10/25/2022   Procedure: HEMOSTASIS CLIP PLACEMENT;  Surgeon:  Normie Becton., MD;  Location: [facility] ENDOSCOPY;  Service: Gastroenterology;;   HOT HEMOSTASIS N/A 10/25/2022   Procedure: HOT HEMOSTASIS (ARGON PLASMA COAGULATION/BICAP);  Surgeon: Normie Becton., MD;  Location: [facility]OSCOPY;  Service: Gastroenterology;  Laterality: N/A;   POLYPECTOMY  07/19/2022   Procedure: POLYPECTOMY;  Surgeon: Elois Hair, MD;  Location: [facility] ENDOSCOPY;  Service: Gastroenterology;;   POLYPECTOMY  06/20/2023   Procedure: POLYPECTOMY, INTESTINE;  Surgeon: Normie Becton., MD;  Location: [facility] ENDOSCOPY;  Service: Gastroenterology;;   SUBMUCOSAL INJECTION  06/20/2023   Procedure: INJECTION, SUBMUCOSAL;  Surgeon: Normie Becton., MD;  Location: [facility] ENDOSCOPY;  Service: Gastroenterology;;   SUBMUCOSAL LIFTING INJECTION  07/19/2022   Procedure: SUBMUCOSAL LIFTING INJECTION;  Surgeon: Elois Hair, MD;  Location: [facility] ENDOSCOPY;  Service: Gastroenterology;;   SUBMUCOSAL LIFTING INJECTION  10/25/2022   Procedure: SUBMUCOSAL LIFTING INJECTION;  Surgeon: Normie Becton., MD;  Location: [facility] ENDOSCOPY;  Service: Gastroenterology;;   SUBMUCOSAL TATTOO INJECTION  10/25/2022   Procedure: SUBMUCOSAL TATTOO INJECTION;  Surgeon: Normie Becton., MD;  Location: [facility]OSCOPY;  Service: Gastroenterology;;   TUBAL LIGATION     Patient Active Problem List   Diagnosis Date Noted   History of colonic polyps 06/20/2023   Adenomatous polyp of ascending colon 06/20/2023   Weight gain due to medication 05/27/2023   Elevated blood pressure  reading without diagnosis of hypertension 04/02/2023   Abnormal food appetite 04/02/2023   OSA (obstructive sleep apnea) 11/08/2022   Bilateral leg edema 07/23/2022   Benign neoplasm of ascending colon 07/19/2022   Acute kidney injury (HCC) 03/28/2022   Prediabetes 03/28/2022   Other spondylosis with radiculopathy, cervical region 10/03/2021   Fecal occult blood test positive 07/15/2021   Periodic limb  movement 07/11/2021   Loud snoring 01/23/2021   Severe obesity (BMI >= 40) (HCC) 02/08/2020   Insulin  resistance 07/28/2019   Depression 07/28/2019   Vitamin D  deficiency 12/09/2018   Class 3 severe obesity without serious comorbidity with body mass index (BMI) of 50.0 to 59.9 in adult (HCC) 01/17/2017   Low back pain 05/17/2016   Unilateral primary osteoarthritis, left knee 05/08/2016   Primary osteoarthritis of both knees 06/27/2015   Closed right fibular fracture 08/10/2014    PCP: Lawrance Presume, MD   REFERRING PROVIDER: Arnie Lao, MD   REFERRING DIAG: M25.511 (ICD-10-CM) - Acute pain of right shoulder   THERAPY DIAG:  Chronic right shoulder pain  Stiffness of right shoulder, not elsewhere classified  Rationale for Evaluation and Treatment: Rehabilitation  ONSET DATE: 1.5 months  SUBJECTIVE:                                                                                                                                                                                      SUBJECTIVE STATEMENT: Pt reports she tolerated yesterday's PT session.  No precipitating incident. Experiences popping and her R shoulder will get stuck.  Hand dominance: Right  PERTINENT HISTORY: OA, sciatic nerve/back pain   PAIN:  Are you having pain? Yes: NPRS scale: ave 6/10-7/10 Pain location: Superior R GH and lateral shoulder Pain description: Ache and sharp Aggravating factors: reaching in multiple directions Relieving factors: Pulling R arm across body and Voltaren  , heat   PRECAUTIONS: None  RED FLAGS: None   WEIGHT BEARING RESTRICTIONS: No  FALLS:  Has patient fallen in last 6 months? No  LIVING ENVIRONMENT: Lives with: lives with an adult companion Lives in: House/apartment   OCCUPATION: Disability  PLOF: Independent  PATIENT GOALS:Pain relief  NEXT MD VISIT:   OBJECTIVE:  Note: Objective measures were completed at Evaluation unless otherwise  noted.  DIAGNOSTIC FINDINGS:  XR R shoulder 05/08/23 3 views of the right shoulder send no acute findings.  The subacromial  outlet is reduced and there is significant AC joint arthritic changes.   The glenohumeral joint is well-maintained.   Msk US  07/08/23 The AC joint was visualized with notable arthritic spurring and a small  effusion present.  The biceps tendon was visualized within the  bicipital  groove both in longitudinal and transverse axis with some tendinopathy but  no evidence of high-grade tearing.  The subscapularis tendon was evaluated  in both static and dynamic motion without significant high-grade tearing.   The supraspinatus was not evaluated both over anterior, posterior and  interval views with evidence of articular sided partial tearing near the  insertion.  There is no low-grade tearing or tendon retraction.  The  infraspinatus was seen with evidence of high-grade partial tearing with  hypoechoic fluid both superior and inferior to the tendon indicative of a  small effusion from the likely underlying tear near the crossover of the  infraspinatus and supraspinatus critical zone.  The teres minor was seen  without notable pathology.  The posterior glenohumeral joint was  visualized without notable effusion or joint abnormality.   PATIENT SURVEYS:  Quick Dash 40/55=66% disability  COGNITION: Overall cognitive status: Within functional limits for tasks assessed     SENSATION: WFL  POSTURE: Forward head, rounded shoulders  UPPER EXTREMITY ROM:   Active ROM Right eval Left eval Rt 07/10/23  Shoulder flexion 110 P 135 145 112  Shoulder extension     Shoulder abduction   76  Shoulder adduction     Shoulder internal rotation L4 T8 Mid lumbar   Shoulder external rotation C7 T3 Side of neck   Elbow flexion     Elbow extension     Wrist flexion     Wrist extension     Wrist ulnar deviation     Wrist radial deviation     Wrist pronation     Wrist  supination      (Blank rows = not tested)  UPPER EXTREMITY MMT:  R shoulder strength was limited with report of pain MMT Right eval Left eval Rt. 07/10/23  Shoulder flexion P  3-/5  Shoulder extension P    Shoulder abduction P  3-/5 P   Shoulder adduction P    Shoulder internal rotation P  4+/5  Shoulder external rotation P  4/5 Pain   Middle trapezius     Lower trapezius     Elbow flexion     Elbow extension     Wrist flexion     Wrist extension     Wrist ulnar deviation     Wrist radial deviation     Wrist pronation     Wrist supination     Grip strength (lbs)     (Blank rows = not tested)  SHOULDER SPECIAL TESTS: Impingement tests: Hawkins/Kennedy impingement test: negative SLAP lesions:  NT Instability tests:  NT Rotator cuff assessment: Empty can test: negative and Full can test: negative Biceps assessment: Yergason's test: negative  JOINT MOBILITY TESTING:  Appropriate GH jt stability  PALPATION:  TTP AC jt  Able to lift elbo  TREATMENT DATE:  [facility] Adult PT Treatment:                                                DATE: 07/11/23 Therapeutic Activity: Supine AAROM with dowel: flexion, chest press, ER Table slides scaption  S/L ER 2x10 S/L abd 2x10 Row and extension in standing GTB 2x10 Standing IR 3x10 GTB Pendulum Anchored upper trap and shoulder distraction stretch Updated HEP Manual Therapy: Grade 3 inf and lat distractions  [facility] Adult PT Treatment:                                                DATE: 07/10/23 Therapeutic Activity: Supine AAROM with dowel: flexion, chest press, ER Table slides scaption  ER and IR standing GTB Row and extension in standing GTB Self Care: Avoid overhead exercises for now Rotator cuff anatomy, caution, options for conservative treatment ,benefits of regular physical therapy in the right exercises in  order to heal the injury  [facility] Adult PT Treatment:                                                DATE: 05/22/23 Therapeutic Exercise: Developed, instructed in, and pt completed therex as noted in HEP  Self Care: Instruction in sleeping position and pillow support for comfort   PATIENT EDUCATION: Education details: Eval findings, POC, HEP, self care Person educated: Patient Education method: Explanation, Demonstration, Tactile cues, Verbal cues, and Handouts Education comprehension: verbalized understanding, returned demonstration, verbal cues required, and tactile cues required  HOME EXERCISE PROGRAM: Access Code: [email or web address] URL: [email or web address] Date: 07/11/2023 Prepared by: Liborio Reeds  Exercises - Flexion-Extension Shoulder Pendulum with Table Support  - 3 x daily - 7 x weekly - 1 sets - 20 reps - Shoulder Extension with Resistance  - 1 x daily - 7 x weekly - 3 sets - 10 reps - 2 hold - Standing Shoulder Row with Anchored Resistance  - 1 x daily - 7 x weekly - 2 sets - 10 reps - 5 hold - Supine Shoulder Flexion Extension AAROM with Dowel  - 1 x daily - 7 x weekly - 2 sets - 10 reps - 10 hold - Supine Shoulder External Rotation with Dowel  - 1 x daily - 7 x weekly - 2 sets - 10 reps - 10 hold - Seated Shoulder Scaption Slide at Table Top with Forearm in Neutral  - 1 x daily - 7 x weekly - 2 sets - 10 reps - 10 hold - Sidelying Shoulder External Rotation  - 1 x daily - 7 x weekly - 2 sets - 10 reps - 2 hold - Sidelying Shoulder Abduction Palm Forward  - 1 x daily - 7 x weekly - 2 sets - 10 reps - 2 hold - Seated Upper Trapezius Stretch  - 1 x daily - 7 x weekly - 1 sets - 3 reps - 20 hold  ASSESSMENT:  CLINICAL IMPRESSION: PT was completed for R shoulder ROM and gentle strengthening. Pt found the manual inf distraction mob and the gentle shoulder distraction/upper trap stretch ex  both helpful with decreasing the R shoulder pain. Pt tolerated PT today without  adverse effects. HEP was updated. Quick [place] Retort was re-assessed and found similar to score on the eval. Pt will continue to benefit from skilled PT to address impairments for improved R shoulder function with minimized pain.   OBJECTIVE IMPAIRMENTS: decreased activity tolerance, decreased ROM, decreased strength, impaired UE functional use, obesity, and pain.   ACTIVITY LIMITATIONS: carrying, lifting, sleeping, reach over head, and caring for others  PARTICIPATION LIMITATIONS: meal prep, cleaning, laundry, and driving  PERSONAL FACTORS: Fitness, Past/current experiences, Social background, and 1 comorbidity: OA  are also affecting patient's functional outcome.   REHAB POTENTIAL: Good  CLINICAL DECISION MAKING: Stable/uncomplicated  EVALUATION COMPLEXITY: Low   GOALS:  SHORT TERM GOALS=LTGs  LONG TERM GOALS: Target date: 07/12/23  Pt will be Ind in a final HEP to maintain achieved LOF  Baseline: started Goal status: ongoing   2.  Increase R shoulder AROM to flex 125d, ER T2, IR L2 for improved functional use Baseline: See flow sheets Goal status:ongoing   3.  Pt will demonstrate OH reach c 2# for functional use with household activities Baseline: Limited Goal status: ongoing   4.  Pt will report 50% or greater decrease in R shoulder pain for improved function and QOL Baseline: 0-9/10 Goal status: ongoing   5.  Pt's Quick Dash score will improve by the MCID to 50% as indication of improved function  Baseline: 66% disability 07/11/23: 64% Goal status: Ongoing PLAN:  PT FREQUENCY: 2x/week  PT DURATION: 6 weeks  PLANNED INTERVENTIONS: 97164- PT Re-evaluation, 97110-Therapeutic exercises, 97530- Therapeutic activity, 97535- Self Care, 16109- Manual therapy, [id number]- Aquatic Therapy, [id number]- Electrical stimulation (unattended), [phone]- Ionotophoresis 4mg /ml Dexamethasone , Patient/Family education, Taping, Dry Needling, Joint mobilization, Cryotherapy, and Moist heat  PLAN FOR NEXT  SESSION:  DASH! Extended POC for 6 min weeks.  AAROM and strength as tolerated. Assess response to HEP; progress therex as indicated; use of modalities, manual therapy; and TPDN as indicated.   Copeland Lapier MS, PT 07/11/23 8:55 AM   Liborio Reeds MS, PT 07/11/23 9:05 AM

## 2023-07-11 ENCOUNTER — Ambulatory Visit: Attending: Orthopaedic Surgery

## 2023-07-11 DIAGNOSIS — G8929 Other chronic pain: Secondary | ICD-10-CM | POA: Insufficient documentation

## 2023-07-11 DIAGNOSIS — M25511 Pain in right shoulder: Secondary | ICD-10-CM | POA: Diagnosis present

## 2023-07-11 DIAGNOSIS — M25611 Stiffness of right shoulder, not elsewhere classified: Secondary | ICD-10-CM | POA: Diagnosis present

## 2023-07-16 ENCOUNTER — Other Ambulatory Visit: Payer: Self-pay

## 2023-07-22 ENCOUNTER — Ambulatory Visit: Admitting: Physical Therapy

## 2023-07-26 ENCOUNTER — Ambulatory Visit

## 2023-07-26 DIAGNOSIS — M25511 Pain in right shoulder: Secondary | ICD-10-CM | POA: Diagnosis not present

## 2023-07-26 DIAGNOSIS — M25611 Stiffness of right shoulder, not elsewhere classified: Secondary | ICD-10-CM

## 2023-07-26 DIAGNOSIS — G8929 Other chronic pain: Secondary | ICD-10-CM

## 2023-07-26 NOTE — Therapy (Signed)
 OUTPATIENT PHYSICAL THERAPY SHOULDER NOTE    Patient Name: Jenna [name] MRN: [id number] DOB:1970-12-12, 53 y.o., female Today's Date: 07/26/2023  END OF SESSION:  PT End of Session - 07/26/23 1104     Visit Number 4    Number of Visits 13    Date for PT Re-Evaluation 08/21/23    Authorization Type [employer]    Authorization - Visit Number 4    Authorization - Number of Visits 27    PT Start Time 1104    PT Stop Time 1145    PT Time Calculation (min) 41 min    Activity Tolerance Patient tolerated treatment well;Patient limited by pain    Behavior During Therapy WFL for tasks assessed/performed                Past Medical History:  Diagnosis Date   Arthritis    Back pain    Back pain    Chest pain    Depression    Edema of both lower extremities    Joint pain    Left arm numbness    Numbness in both hands    Osteoarthritis    Prediabetes    SOB (shortness of breath)    Vitamin D  deficiency    Past Surgical History:  Procedure Laterality Date   CHOLECYSTECTOMY     COLONOSCOPY     COLONOSCOPY WITH PROPOFOL  N/A 07/19/2022   Procedure: COLONOSCOPY WITH PROPOFOL ;  Surgeon: Elois Hair, MD;  Location: [facility] ENDOSCOPY;  Service: Gastroenterology;  Laterality: N/A;   COLONOSCOPY WITH PROPOFOL  N/A 10/25/2022   Procedure: COLONOSCOPY WITH PROPOFOL ;  Surgeon: Brice Campi Albino Alu., MD;  Location: [facility] ENDOSCOPY;  Service: Gastroenterology;  Laterality: N/A;   COLONOSCOPY WITH PROPOFOL  N/A 06/20/2023   Procedure: COLONOSCOPY WITH PROPOFOL ;  Surgeon: Mansouraty, Albino Alu., MD;  Location: [facility] ENDOSCOPY;  Service: Gastroenterology;  Laterality: N/A;   ENDOSCOPIC MUCOSAL RESECTION N/A 10/25/2022   Procedure: ENDOSCOPIC MUCOSAL RESECTION;  Surgeon: Brice Campi Albino Alu., MD;  Location: [facility] ENDOSCOPY;  Service: Gastroenterology;  Laterality: N/A;   HEMOSTASIS CLIP PLACEMENT  10/25/2022   Procedure: HEMOSTASIS CLIP PLACEMENT;  Surgeon: Normie Becton., MD;  Location: [facility] ENDOSCOPY;  Service: Gastroenterology;;   HOT HEMOSTASIS N/A 10/25/2022   Procedure: HOT HEMOSTASIS (ARGON PLASMA COAGULATION/BICAP);  Surgeon: Normie Becton., MD;  Location: [facility] ENDOSCOPY;  Service: Gastroenterology;  Laterality: N/A;   POLYPECTOMY  07/19/2022   Procedure: POLYPECTOMY;  Surgeon: Elois Hair, MD;  Location: [facility] ENDOSCOPY;  Service: Gastroenterology;;   POLYPECTOMY  06/20/2023   Procedure: POLYPECTOMY, INTESTINE;  Surgeon: Normie Becton., MD;  Location: [facility] ENDOSCOPY;  Service: Gastroenterology;;   SUBMUCOSAL INJECTION  06/20/2023   Procedure: INJECTION, SUBMUCOSAL;  Surgeon: Normie Becton., MD;  Location: [facility] ENDOSCOPY;  Service: Gastroenterology;;   SUBMUCOSAL LIFTING INJECTION  07/19/2022   Procedure: SUBMUCOSAL LIFTING INJECTION;  Surgeon: Elois Hair, MD;  Location: [facility] ENDOSCOPY;  Service: Gastroenterology;;   SUBMUCOSAL LIFTING INJECTION  10/25/2022   Procedure: SUBMUCOSAL LIFTING INJECTION;  Surgeon: Normie Becton., MD;  Location: [facility] ENDOSCOPY;  Service: Gastroenterology;;   SUBMUCOSAL TATTOO INJECTION  10/25/2022   Procedure: SUBMUCOSAL TATTOO INJECTION;  Surgeon: Normie Becton., MD;  Location: [facility] ENDOSCOPY;  Service: Gastroenterology;;   TUBAL LIGATION     Patient Active Problem List   Diagnosis Date Noted   History of colonic polyps 06/20/2023   Adenomatous polyp of ascending colon 06/20/2023   Weight gain due to medication 05/27/2023   Elevated blood pressure  reading without diagnosis of hypertension 04/02/2023   Abnormal food appetite 04/02/2023   OSA (obstructive sleep apnea) 11/08/2022   Bilateral leg edema 07/23/2022   Benign neoplasm of ascending colon 07/19/2022   Acute kidney injury (HCC) 03/28/2022   Prediabetes 03/28/2022   Other spondylosis with radiculopathy, cervical region 10/03/2021   Fecal occult blood test positive 07/15/2021   Periodic limb movement  07/11/2021   Loud snoring 01/23/2021   Severe obesity (BMI >= 40) (HCC) 02/08/2020   Insulin  resistance 07/28/2019   Depression 07/28/2019   Vitamin D  deficiency 12/09/2018   Class 3 severe obesity without serious comorbidity with body mass index (BMI) of 50.0 to 59.9 in adult 01/17/2017   Low back pain 05/17/2016   Unilateral primary osteoarthritis, left knee 05/08/2016   Primary osteoarthritis of both knees 06/27/2015   Closed right fibular fracture 08/10/2014    PCP: Lawrance Presume, MD   REFERRING PROVIDER: Arnie Lao, MD   REFERRING DIAG: M25.511 (ICD-10-CM) - Acute pain of right shoulder   THERAPY DIAG:  Chronic right shoulder pain  Stiffness of right shoulder, not elsewhere classified  Rationale for Evaluation and Treatment: Rehabilitation  ONSET DATE: 1.5 months  SUBJECTIVE:                                                                                                                                                                                      SUBJECTIVE STATEMENT: Pt reports her R shoulder is not feeling better. It continues to pop.  No precipitating incident. Experiences popping and her R shoulder will get stuck.  Hand dominance: Right  PERTINENT HISTORY: OA, sciatic nerve/back pain   PAIN:  Are you having pain? Yes: NPRS scale: ave 6/10-7/10 Pain location: Superior R GH and lateral shoulder Pain description: Ache and sharp Aggravating factors: reaching in multiple directions Relieving factors: Pulling R arm across body and Voltaren  , heat   PRECAUTIONS: None  RED FLAGS: None   WEIGHT BEARING RESTRICTIONS: No  FALLS:  Has patient fallen in last 6 months? No  LIVING ENVIRONMENT: Lives with: lives with an adult companion Lives in: House/apartment   OCCUPATION: Disability  PLOF: Independent  PATIENT GOALS:Pain relief  NEXT MD VISIT:   OBJECTIVE:  Note: Objective measures were completed at Evaluation unless  otherwise noted.  DIAGNOSTIC FINDINGS:  XR R shoulder 05/08/23 3 views of the right shoulder send no acute findings.  The subacromial  outlet is reduced and there is significant AC joint arthritic changes.   The glenohumeral joint is well-maintained.   Msk US  07/08/23 The AC joint was visualized with notable arthritic spurring and a small  effusion present.  The biceps  tendon was visualized within the bicipital  groove both in longitudinal and transverse axis with some tendinopathy but  no evidence of high-grade tearing.  The subscapularis tendon was evaluated  in both static and dynamic motion without significant high-grade tearing.   The supraspinatus was not evaluated both over anterior, posterior and  interval views with evidence of articular sided partial tearing near the  insertion.  There is no low-grade tearing or tendon retraction.  The  infraspinatus was seen with evidence of high-grade partial tearing with  hypoechoic fluid both superior and inferior to the tendon indicative of a  small effusion from the likely underlying tear near the crossover of the  infraspinatus and supraspinatus critical zone.  The teres minor was seen  without notable pathology.  The posterior glenohumeral joint was  visualized without notable effusion or joint abnormality.   PATIENT SURVEYS:  Quick Dash 40/55=66% disability  COGNITION: Overall cognitive status: Within functional limits for tasks assessed     SENSATION: WFL  POSTURE: Forward head, rounded shoulders  UPPER EXTREMITY ROM:   Active ROM Right eval Left eval Rt 07/10/23 RT 07/26/23  Shoulder flexion 110 P 135 145 112 135 p  Shoulder extension      Shoulder abduction   76 80 p  Shoulder adduction      Shoulder internal rotation L4 T8 Mid lumbar  Mid Lumbar  Shoulder external rotation C7 T3 Side of neck  T1  Elbow flexion      Elbow extension      Wrist flexion      Wrist extension      Wrist ulnar deviation      Wrist  radial deviation      Wrist pronation      Wrist supination       (Blank rows = not tested)  UPPER EXTREMITY MMT:  R shoulder strength was limited with report of pain MMT Right eval Left eval Rt. 07/10/23  Shoulder flexion P  3-/5  Shoulder extension P    Shoulder abduction P  3-/5 P   Shoulder adduction P    Shoulder internal rotation P  4+/5  Shoulder external rotation P  4/5 Pain   Middle trapezius     Lower trapezius     Elbow flexion     Elbow extension     Wrist flexion     Wrist extension     Wrist ulnar deviation     Wrist radial deviation     Wrist pronation     Wrist supination     Grip strength (lbs)     (Blank rows = not tested)  SHOULDER SPECIAL TESTS: Impingement tests: Hawkins/Kennedy impingement test: negative SLAP lesions: NT Instability tests: NT Rotator cuff assessment: Empty can test: negative and Full can test: negative Biceps assessment: Yergason's test: negative  JOINT MOBILITY TESTING:  Appropriate GH jt stability  PALPATION:  TTP AC jt  Able to lift elbo  TREATMENT DATE:  [facility] Adult PT Treatment:                                                DATE: 07/26/23 Therapeutic Activity: Supine AAROM with dowel: flexion, chest press Supine serratus chest pression 3# 2x10 S/L ER 3x10 1# S/L abd 3x10 1# Row and extension in standing GTB 2x15 Bilat Standing ER 2x10 RTB Modalities: Iontophoresis 4 mg/ml L superior/lateral GH jt for 6 hours Advised not to get wet and of possible skin irritation from adhesive  [facility] Adult PT Treatment:                                                DATE: 07/11/23 Therapeutic Activity: Supine AAROM with dowel: flexion, chest press, ER Table slides scaption  S/L ER 2x10 S/L abd 2x10 Row and extension in standing GTB 2x10 Standing IR 3x10 GTB Pendulum Anchored upper trap and shoulder distraction  stretch Updated HEP Manual Therapy: Grade 3 inf and lat distractions  [facility] Adult PT Treatment:                                                DATE: 07/10/23 Therapeutic Activity: Supine AAROM with dowel: flexion, chest press, ER Table slides scaption  ER and IR standing GTB Row and extension in standing GTB Self Care: Avoid overhead exercises for now Rotator cuff anatomy, caution, options for conservative treatment ,benefits of regular physical therapy in the right exercises in order to heal the injury  PATIENT EDUCATION: Education details: Eval findings, POC, HEP, self care Person educated: Patient Education method: Explanation, Demonstration, Tactile cues, Verbal cues, and Handouts Education comprehension: verbalized understanding, returned demonstration, verbal cues required, and tactile cues required  HOME EXERCISE PROGRAM: Access Code: [email or web address] URL: [email or web address] Date: 07/11/2023 Prepared by: Liborio Reeds  Exercises - Flexion-Extension Shoulder Pendulum with Table Support  - 3 x daily - 7 x weekly - 1 sets - 20 reps - Shoulder Extension with Resistance  - 1 x daily - 7 x weekly - 3 sets - 10 reps - 2 hold - Standing Shoulder Row with Anchored Resistance  - 1 x daily - 7 x weekly - 2 sets - 10 reps - 5 hold - Supine Shoulder Flexion Extension AAROM with Dowel  - 1 x daily - 7 x weekly - 2 sets - 10 reps - 10 hold - Supine Shoulder External Rotation with Dowel  - 1 x daily - 7 x weekly - 2 sets - 10 reps - 10 hold - Seated Shoulder Scaption Slide at Table Top with Forearm in Neutral  - 1 x daily - 7 x weekly - 2 sets - 10 reps - 10 hold - Sidelying Shoulder External Rotation  - 1 x daily - 7 x weekly - 2 sets - 10 reps - 2 hold - Sidelying Shoulder Abduction Palm Forward  - 1 x daily - 7 x weekly - 2 sets - 10 reps - 2 hold - Seated Upper Trapezius Stretch  - 1 x daily - 7 x weekly - 1 sets - 3 reps -  20 hold  ASSESSMENT:  CLINICAL IMPRESSION: Pt  reports not tolerating R shoulder AAROMs exercises well except for those with a dowel. PT was completed was completed for R shoulder ROM and strengthening with iontophoresis applied at the end for pain modulation. Strengthening focused on the [facility] and periscapular muscles. Pt reports pain is not improving, but AROM for shoulder flexion has made good improvement, AROM for abd has not with pt reporting limitation due to pain. Pt tolerated PT today without adverse effects. HEP was updated. Pt will continue to benefit from skilled PT to address impairments for improved R shoulder function with minimized pain.   OBJECTIVE IMPAIRMENTS: decreased activity tolerance, decreased ROM, decreased strength, impaired UE functional use, obesity, and pain.   ACTIVITY LIMITATIONS: carrying, lifting, sleeping, reach over head, and caring for others  PARTICIPATION LIMITATIONS: meal prep, cleaning, laundry, and driving  PERSONAL FACTORS: Fitness, Past/current experiences, Social background, and 1 comorbidity: OA are also affecting patient's functional outcome.   REHAB POTENTIAL: Good  CLINICAL DECISION MAKING: Stable/uncomplicated  EVALUATION COMPLEXITY: Low   GOALS:  SHORT TERM GOALS=LTGs  LONG TERM GOALS: Target date: 07/12/23  Pt will be Ind in a final HEP to maintain achieved LOF  Baseline: started Goal status: ongoing   2.  Increase R shoulder AROM to flex 125d, ER T2, IR L2 for improved functional use Baseline: See flow sheets 07/26/23: flexion 130d Goal status:IMPROVING  3.  Pt will demonstrate OH reach c 2# for functional use with household activities Baseline: Limited Goal status: ongoing   4.  Pt will report 50% or greater decrease in R shoulder pain for improved function and QOL Baseline: 0-9/10 Goal status: ongoing   5.  Pt's Quick Dash score will improve by the MCID to 50% as indication of improved function  Baseline: 66% disability 07/11/23: 64% Goal status: Ongoing PLAN:  PT  FREQUENCY: 2x/week  PT DURATION: 6 weeks  PLANNED INTERVENTIONS: 97164- PT Re-evaluation, 97110-Therapeutic exercises, 97530- Therapeutic activity, 97535- Self Care, 16109- Manual therapy, [id number]- Aquatic Therapy, U0454- Electrical stimulation (unattended), 97033- Ionotophoresis 4mg /ml Dexamethasone , Patient/Family education, Taping, Dry Needling, Joint mobilization, Cryotherapy, and Moist heat  PLAN FOR NEXT SESSION:  DASH! Extended POC for 6 min weeks.  AAROM and strength as tolerated. Assess response to HEP; progress therex as indicated; use of modalities, manual therapy; and TPDN as indicated.   Javelle Donigan MS, PT 07/26/23 3:32 PM

## 2023-07-26 NOTE — Therapy (Signed)
 OUTPATIENT PHYSICAL THERAPY SHOULDER NOTE   Patient Name: Jenna [name] MRN: [id number] DOB:03/15/1970, 53 y.o., female Today's Date: 07/29/2023  END OF SESSION:  PT End of Session - 07/29/23 1110     Visit Number 5    Number of Visits 13    Date for PT Re-Evaluation 08/21/23    Authorization Type [employer]    Authorization - Visit Number 5    Authorization - Number of Visits 27    PT Start Time 1108    PT Stop Time 1153    PT Time Calculation (min) 45 min    Activity Tolerance Patient tolerated treatment well;Patient limited by pain    Behavior During Therapy WFL for tasks assessed/performed                Past Medical History:  Diagnosis Date   Arthritis    Back pain    Back pain    Chest pain    Depression    Edema of both lower extremities    Joint pain    Left arm numbness    Numbness in both hands    Osteoarthritis    Prediabetes    SOB (shortness of breath)    Vitamin D  deficiency    Past Surgical History:  Procedure Laterality Date   CHOLECYSTECTOMY     COLONOSCOPY     COLONOSCOPY WITH PROPOFOL  N/A 07/19/2022   Procedure: COLONOSCOPY WITH PROPOFOL ;  Surgeon: Elois Hair, MD;  Location: [facility] ENDOSCOPY;  Service: Gastroenterology;  Laterality: N/A;   COLONOSCOPY WITH PROPOFOL  N/A 10/25/2022   Procedure: COLONOSCOPY WITH PROPOFOL ;  Surgeon: Mansouraty, Albino Alu., MD;  Location: [facility] ENDOSCOPY;  Service: Gastroenterology;  Laterality: N/A;   COLONOSCOPY WITH PROPOFOL  N/A 06/20/2023   Procedure: COLONOSCOPY WITH PROPOFOL ;  Surgeon: Mansouraty, Albino Alu., MD;  Location: [facility] ENDOSCOPY;  Service: Gastroenterology;  Laterality: N/A;   ENDOSCOPIC MUCOSAL RESECTION N/A 10/25/2022   Procedure: ENDOSCOPIC MUCOSAL RESECTION;  Surgeon: Brice Campi Albino Alu., MD;  Location: [facility] ENDOSCOPY;  Service: Gastroenterology;  Laterality: N/A;   HEMOSTASIS CLIP PLACEMENT  10/25/2022   Procedure: HEMOSTASIS CLIP PLACEMENT;  Surgeon: Normie Becton., MD;  Location: [facility] ENDOSCOPY;  Service: Gastroenterology;;   HOT HEMOSTASIS N/A 10/25/2022   Procedure: HOT HEMOSTASIS (ARGON PLASMA COAGULATION/BICAP);  Surgeon: Normie Becton., MD;  Location: [facility] ENDOSCOPY;  Service: Gastroenterology;  Laterality: N/A;   POLYPECTOMY  07/19/2022   Procedure: POLYPECTOMY;  Surgeon: Elois Hair, MD;  Location: [facility] ENDOSCOPY;  Service: Gastroenterology;;   POLYPECTOMY  06/20/2023   Procedure: POLYPECTOMY, INTESTINE;  Surgeon: Normie Becton., MD;  Location: [facility] ENDOSCOPY;  Service: Gastroenterology;;   SUBMUCOSAL INJECTION  06/20/2023   Procedure: INJECTION, SUBMUCOSAL;  Surgeon: Normie Becton., MD;  Location: [facility] ENDOSCOPY;  Service: Gastroenterology;;   SUBMUCOSAL LIFTING INJECTION  07/19/2022   Procedure: SUBMUCOSAL LIFTING INJECTION;  Surgeon: Elois Hair, MD;  Location: [facility] ENDOSCOPY;  Service: Gastroenterology;;   SUBMUCOSAL LIFTING INJECTION  10/25/2022   Procedure: SUBMUCOSAL LIFTING INJECTION;  Surgeon: Normie Becton., MD;  Location: [facility] ENDOSCOPY;  Service: Gastroenterology;;   SUBMUCOSAL TATTOO INJECTION  10/25/2022   Procedure: SUBMUCOSAL TATTOO INJECTION;  Surgeon: Normie Becton., MD;  Location: [facility] ENDOSCOPY;  Service: Gastroenterology;;   TUBAL LIGATION     Patient Active Problem List   Diagnosis Date Noted   History of colonic polyps 06/20/2023   Adenomatous polyp of ascending colon 06/20/2023   Weight gain due to medication 05/27/2023   Elevated blood pressure reading  without diagnosis of hypertension 04/02/2023   Abnormal food appetite 04/02/2023   OSA (obstructive sleep apnea) 11/08/2022   Bilateral leg edema 07/23/2022   Benign neoplasm of ascending colon 07/19/2022   Acute kidney injury (HCC) 03/28/2022   Prediabetes 03/28/2022   Other spondylosis with radiculopathy, cervical region 10/03/2021   Fecal occult blood test positive 07/15/2021   Periodic limb movement  07/11/2021   Loud snoring 01/23/2021   Severe obesity (BMI >= 40) (HCC) 02/08/2020   Insulin  resistance 07/28/2019   Depression 07/28/2019   Vitamin D  deficiency 12/09/2018   Class 3 severe obesity without serious comorbidity with body mass index (BMI) of 50.0 to 59.9 in adult 01/17/2017   Low back pain 05/17/2016   Unilateral primary osteoarthritis, left knee 05/08/2016   Primary osteoarthritis of both knees 06/27/2015   Closed right fibular fracture 08/10/2014    PCP: Lawrance Presume, MD   REFERRING PROVIDER: Arnie Lao, MD   REFERRING DIAG: M25.511 (ICD-10-CM) - Acute pain of right shoulder   THERAPY DIAG:  Chronic right shoulder pain  Stiffness of right shoulder, not elsewhere classified  Rationale for Evaluation and Treatment: Rehabilitation  ONSET DATE: 1.5 months  SUBJECTIVE:                                                                                                                                                                                      SUBJECTIVE STATEMENT: No new complaints. Rt shoulder pain is 4/10.   No precipitating incident. Experiences popping and her R shoulder will get stuck.  Hand dominance: Right  PERTINENT HISTORY: OA, sciatic nerve/back pain   PAIN:  Are you having pain? Yes: NPRS scale: ave 6/10-7/10 Pain location: Superior R GH and lateral shoulder Pain description: Ache and sharp Aggravating factors: reaching in multiple directions Relieving factors: Pulling R arm across body and Voltaren  , heat   PRECAUTIONS: None  RED FLAGS: None   WEIGHT BEARING RESTRICTIONS: No  FALLS:  Has patient fallen in last 6 months? No  LIVING ENVIRONMENT: Lives with: lives with an adult companion Lives in: House/apartment   OCCUPATION: Disability  PLOF: Independent  PATIENT GOALS:Pain relief  NEXT MD VISIT:   OBJECTIVE:  Note: Objective measures were completed at Evaluation unless otherwise  noted.  DIAGNOSTIC FINDINGS:  XR R shoulder 05/08/23 3 views of the right shoulder send no acute findings.  The subacromial  outlet is reduced and there is significant AC joint arthritic changes.   The glenohumeral joint is well-maintained.   Msk US  07/08/23 The AC joint was visualized with notable arthritic spurring and a small  effusion present.  The biceps tendon was visualized within the  bicipital  groove both in longitudinal and transverse axis with some tendinopathy but  no evidence of high-grade tearing.  The subscapularis tendon was evaluated  in both static and dynamic motion without significant high-grade tearing.   The supraspinatus was not evaluated both over anterior, posterior and  interval views with evidence of articular sided partial tearing near the  insertion.  There is no low-grade tearing or tendon retraction.  The  infraspinatus was seen with evidence of high-grade partial tearing with  hypoechoic fluid both superior and inferior to the tendon indicative of a  small effusion from the likely underlying tear near the crossover of the  infraspinatus and supraspinatus critical zone.  The teres minor was seen  without notable pathology.  The posterior glenohumeral joint was  visualized without notable effusion or joint abnormality.   PATIENT SURVEYS:  Quick Dash 40/55=66% disability  COGNITION: Overall cognitive status: Within functional limits for tasks assessed     SENSATION: WFL  POSTURE: Forward head, rounded shoulders  UPPER EXTREMITY ROM:   Active ROM Right eval Left eval Rt 07/10/23  Shoulder flexion 110 P 135 145 112  Shoulder extension     Shoulder abduction   76  Shoulder adduction     Shoulder internal rotation L4 T8 Mid lumbar   Shoulder external rotation C7 T3 Side of neck   Elbow flexion     Elbow extension     Wrist flexion     Wrist extension     Wrist ulnar deviation     Wrist radial deviation     Wrist pronation     Wrist  supination      (Blank rows = not tested)  UPPER EXTREMITY MMT:  R shoulder strength was limited with report of pain MMT Right eval Left eval Rt. 07/10/23  Shoulder flexion P  3-/5  Shoulder extension P    Shoulder abduction P  3-/5 P   Shoulder adduction P    Shoulder internal rotation P  4+/5  Shoulder external rotation P  4/5 Pain   Middle trapezius     Lower trapezius     Elbow flexion     Elbow extension     Wrist flexion     Wrist extension     Wrist ulnar deviation     Wrist radial deviation     Wrist pronation     Wrist supination     Grip strength (lbs)     (Blank rows = not tested)  SHOULDER SPECIAL TESTS: Impingement tests: Hawkins/Kennedy impingement test: negative SLAP lesions: NT Instability tests: NT Rotator cuff assessment: Empty can test: negative and Full can test: negative Biceps assessment: Yergason's test: negative  JOINT MOBILITY TESTING:  Appropriate GH jt stability  PALPATION:  TTP AC jt  Able to lift elbo  TREATMENT DATE:  [facility] Adult PT Treatment:                                                DATE: 07/29/23 Therapeutic Activity: UBE for 5 min level 1  Row and extension in standing GTB 2 x10 ER 2 x 10 red band done bilateral ER with shoulder flexion 5 x 2 sidefacing each UE  Horizontal pull red band  x 15  Red looped triceps  Blue band biceps  Sidelying blue band UTR Rt UE only  Modalities: Iontophoresis 4 mg/ml L superior/lateral GH jt for 6 hours   Manual Therapy: Rt upper trap, lateral cervical spine Gentle manual traction to see if sensory symptoms could be influenced.     PATIENT EDUCATION: Education details: Eval findings, POC, HEP, self care Person educated: Patient Education method: Explanation, Demonstration, Tactile cues, Verbal cues, and Handouts Education comprehension: verbalized understanding,  returned demonstration, verbal cues required, and tactile cues required  HOME EXERCISE PROGRAM: Access Code: [email or web address] URL: [email or web address] Date: 07/11/2023 Prepared by: Liborio Reeds  Exercises - Flexion-Extension Shoulder Pendulum with Table Support  - 3 x daily - 7 x weekly - 1 sets - 20 reps - Shoulder Extension with Resistance  - 1 x daily - 7 x weekly - 3 sets - 10 reps - 2 hold - Standing Shoulder Row with Anchored Resistance  - 1 x daily - 7 x weekly - 2 sets - 10 reps - 5 hold - Supine Shoulder Flexion Extension AAROM with Dowel  - 1 x daily - 7 x weekly - 2 sets - 10 reps - 10 hold - Supine Shoulder External Rotation with Dowel  - 1 x daily - 7 x weekly - 2 sets - 10 reps - 10 hold - Seated Shoulder Scaption Slide at Table Top with Forearm in Neutral  - 1 x daily - 7 x weekly - 2 sets - 10 reps - 10 hold - Sidelying Shoulder External Rotation  - 1 x daily - 7 x weekly - 2 sets - 10 reps - 2 hold - Sidelying Shoulder Abduction Palm Forward  - 1 x daily - 7 x weekly - 2 sets - 10 reps - 2 hold - Seated Upper Trapezius Stretch  - 1 x daily - 7 x weekly - 1 sets - 3 reps - 20 hold  ASSESSMENT:  CLINICAL IMPRESSION: Patient able to tolerate light strengthening exercises for Rt and Lt UE.  She is interested in the [facility] as an adjunct for her Rt UE ( she also has bilateral knee pain).  Pt continues to do her home program, but says now the numbness in her Rt UE is fairly constant.  She has increased upper trap tension and pain which may be a contributing factor.  She reports feeling lighter and more relaxed after manual session.  Pt will make 2 x 2 more weeks of appts and may reach back out to her MD for follow up    OBJECTIVE IMPAIRMENTS: decreased activity tolerance, decreased ROM, decreased strength, impaired UE functional use, obesity, and pain.   ACTIVITY LIMITATIONS: carrying, lifting, sleeping, reach over head, and caring for others  PARTICIPATION LIMITATIONS:  meal prep, cleaning, laundry, and driving  PERSONAL FACTORS: Fitness, Past/current experiences, Social background, and 1 comorbidity: OA are also affecting patient's functional outcome.   REHAB POTENTIAL: Good  CLINICAL DECISION MAKING: Stable/uncomplicated  EVALUATION COMPLEXITY: Low   GOALS:  SHORT TERM GOALS=LTGs  LONG TERM GOALS: Target date: 07/12/23  Pt will be Ind in a final HEP to maintain achieved LOF  Baseline: started Goal status: ongoing   2.  Increase R shoulder AROM to flex 125d, ER T2, IR L2 for improved functional use Baseline: See flow sheets Goal status:ongoing   3.  Pt will demonstrate OH reach c 2# for functional use with household activities Baseline: Limited Goal status: ongoing   4.  Pt will report 50% or greater decrease in R shoulder pain for improved function and QOL Baseline: 0-9/10 Goal status: ongoing   5.  Pt's Quick Dash score will improve by the MCID to 50% as indication of improved function  Baseline: 66% disability 07/11/23: 64% Goal status: Ongoing PLAN:  PT FREQUENCY: 2x/week  PT DURATION: 6 weeks  PLANNED INTERVENTIONS: [id number]- PT Re-evaluation, 97110-Therapeutic exercises, 97530- Therapeutic activity, 97535- Self Care, 40981- Manual therapy, [id number]- Aquatic Therapy, [id number]- Electrical stimulation (unattended), [id number]- Ionotophoresis 4mg /ml Dexamethasone , Patient/Family education, Taping, Dry Needling, Joint mobilization, Cryotherapy, and Moist heat  PLAN FOR NEXT SESSION:  manual, ionto. Consider aquatics   AAROM and strength as tolerated. Assess response to HEP; progress therex as indicated; use of modalities, manual therapy; and TPDN as indicated.   Marci Setter, PT 07/29/23 12:04 PM Phone: [phone] Fax: [phone]

## 2023-07-29 ENCOUNTER — Ambulatory Visit: Admitting: Physical Therapy

## 2023-07-29 ENCOUNTER — Encounter: Payer: Self-pay | Admitting: Physical Therapy

## 2023-07-29 DIAGNOSIS — M25611 Stiffness of right shoulder, not elsewhere classified: Secondary | ICD-10-CM

## 2023-07-29 DIAGNOSIS — G8929 Other chronic pain: Secondary | ICD-10-CM

## 2023-07-29 DIAGNOSIS — M25511 Pain in right shoulder: Secondary | ICD-10-CM | POA: Diagnosis not present

## 2023-07-29 NOTE — Patient Instructions (Signed)

## 2023-08-02 ENCOUNTER — Ambulatory Visit: Admitting: Physical Therapy

## 2023-08-03 ENCOUNTER — Other Ambulatory Visit: Payer: Self-pay | Admitting: Internal Medicine

## 2023-08-03 DIAGNOSIS — G4761 Periodic limb movement disorder: Secondary | ICD-10-CM

## 2023-08-06 ENCOUNTER — Telehealth: Payer: Self-pay

## 2023-08-06 ENCOUNTER — Ambulatory Visit

## 2023-08-06 NOTE — Telephone Encounter (Signed)
 Spoke to pt via phone re: no show appt. Pt states she did not receive a text reminder. Pt was advised of the attendance policy and her next appt.

## 2023-08-06 NOTE — Therapy (Incomplete)
 OUTPATIENT PHYSICAL THERAPY SHOULDER NOTE   Patient Name: Jenna [name] MRN: [id number] DOB:05-11-1970, 53 y.o., female Today's Date: 08/06/2023  END OF SESSION:       Past Medical History:  Diagnosis Date   Arthritis    Back pain    Back pain    Chest pain    Depression    Edema of both lower extremities    Joint pain    Left arm numbness    Numbness in both hands    Osteoarthritis    Prediabetes    SOB (shortness of breath)    Vitamin D  deficiency    Past Surgical History:  Procedure Laterality Date   CHOLECYSTECTOMY     COLONOSCOPY     COLONOSCOPY WITH PROPOFOL  N/A 07/19/2022   Procedure: COLONOSCOPY WITH PROPOFOL ;  Surgeon: Elois Hair, MD;  Location: [facility] ENDOSCOPY;  Service: Gastroenterology;  Laterality: N/A;   COLONOSCOPY WITH PROPOFOL  N/A 10/25/2022   Procedure: COLONOSCOPY WITH PROPOFOL ;  Surgeon: Brice Campi Albino Alu., MD;  Location: [facility] ENDOSCOPY;  Service: Gastroenterology;  Laterality: N/A;   COLONOSCOPY WITH PROPOFOL  N/A 06/20/2023   Procedure: COLONOSCOPY WITH PROPOFOL ;  Surgeon: Mansouraty, Albino Alu., MD;  Location: [facility] ENDOSCOPY;  Service: Gastroenterology;  Laterality: N/A;   ENDOSCOPIC MUCOSAL RESECTION N/A 10/25/2022   Procedure: ENDOSCOPIC MUCOSAL RESECTION;  Surgeon: Brice Campi Albino Alu., MD;  Location: [facility] ENDOSCOPY;  Service: Gastroenterology;  Laterality: N/A;   HEMOSTASIS CLIP PLACEMENT  10/25/2022   Procedure: HEMOSTASIS CLIP PLACEMENT;  Surgeon: Normie Becton., MD;  Location: [facility] ENDOSCOPY;  Service: Gastroenterology;;   HOT HEMOSTASIS N/A 10/25/2022   Procedure: HOT HEMOSTASIS (ARGON PLASMA COAGULATION/BICAP);  Surgeon: Normie Becton., MD;  Location: [facility] ENDOSCOPY;  Service: Gastroenterology;  Laterality: N/A;   POLYPECTOMY  07/19/2022   Procedure: POLYPECTOMY;  Surgeon: Elois Hair, MD;  Location: [facility] ENDOSCOPY;  Service: Gastroenterology;;   POLYPECTOMY  06/20/2023   Procedure: POLYPECTOMY, INTESTINE;   Surgeon: Normie Becton., MD;  Location: [facility] ENDOSCOPY;  Service: Gastroenterology;;   SUBMUCOSAL INJECTION  06/20/2023   Procedure: INJECTION, SUBMUCOSAL;  Surgeon: Normie Becton., MD;  Location: [facility] ENDOSCOPY;  Service: Gastroenterology;;   SUBMUCOSAL LIFTING INJECTION  07/19/2022   Procedure: SUBMUCOSAL LIFTING INJECTION;  Surgeon: Elois Hair, MD;  Location: [facility] ENDOSCOPY;  Service: Gastroenterology;;   [place] LIFTING INJECTION  10/25/2022   Procedure: SUBMUCOSAL LIFTING INJECTION;  Surgeon: Normie Becton., MD;  Location: [facility] ENDOSCOPY;  Service: Gastroenterology;;   SUBMUCOSAL TATTOO INJECTION  10/25/2022   Procedure: SUBMUCOSAL TATTOO INJECTION;  Surgeon: Normie Becton., MD;  Location: [facility] ENDOSCOPY;  Service: Gastroenterology;;   TUBAL LIGATION     Patient Active Problem List   Diagnosis Date Noted   History of colonic polyps 06/20/2023   Adenomatous polyp of ascending colon 06/20/2023   Weight gain due to medication 05/27/2023   Elevated blood pressure reading without diagnosis of hypertension 04/02/2023   Abnormal food appetite 04/02/2023   OSA (obstructive sleep apnea) 11/08/2022   Bilateral leg edema 07/23/2022   Benign neoplasm of ascending colon 07/19/2022   Acute kidney injury (HCC) 03/28/2022   Prediabetes 03/28/2022   Other spondylosis with radiculopathy, cervical region 10/03/2021   Fecal occult blood test positive 07/15/2021   Periodic limb movement 07/11/2021   Loud snoring 01/23/2021   Severe obesity (BMI >= 40) (HCC) 02/08/2020   Insulin  resistance 07/28/2019   Depression 07/28/2019   Vitamin D  deficiency 12/09/2018   Class 3 severe obesity without serious comorbidity with body mass index (BMI)  of 50.0 to 59.9 in adult 01/17/2017   Low back pain 05/17/2016   Unilateral primary osteoarthritis, left knee 05/08/2016   Primary osteoarthritis of both knees 06/27/2015   Closed right fibular fracture 08/10/2014    PCP:  Lawrance Presume, MD   REFERRING PROVIDER: Arnie Lao, MD   REFERRING DIAG: [id number] (ICD-10-CM) - Acute pain of right shoulder   THERAPY DIAG:  No diagnosis found.  Rationale for Evaluation and Treatment: Rehabilitation  ONSET DATE: 1.5 months  SUBJECTIVE:                                                                                                                                                                                      SUBJECTIVE STATEMENT: No new complaints. Rt shoulder pain is 4/10.   No precipitating incident. Experiences popping and her R shoulder will get stuck.  Hand dominance: Right  PERTINENT HISTORY: OA, sciatic nerve/back pain   PAIN:  Are you having pain? Yes: NPRS scale: ave 6/10-7/10 Pain location: Superior R GH and lateral shoulder Pain description: Ache and sharp Aggravating factors: reaching in multiple directions Relieving factors: Pulling R arm across body and Voltaren  , heat   PRECAUTIONS: None  RED FLAGS: None   WEIGHT BEARING RESTRICTIONS: No  FALLS:  Has patient fallen in last 6 months? No  LIVING ENVIRONMENT: Lives with: lives with an adult companion Lives in: House/apartment   OCCUPATION: Disability  PLOF: Independent  PATIENT GOALS:Pain relief  NEXT MD VISIT:   OBJECTIVE:  Note: Objective measures were completed at Evaluation unless otherwise noted.  DIAGNOSTIC FINDINGS:  XR R shoulder 05/08/23 3 views of the right shoulder send no acute findings.  The subacromial  outlet is reduced and there is significant AC joint arthritic changes.   The glenohumeral joint is well-maintained.   Msk US  07/08/23 The AC joint was visualized with notable arthritic spurring and a small  effusion present.  The biceps tendon was visualized within the bicipital  groove both in longitudinal and transverse axis with some tendinopathy but  no evidence of high-grade tearing.  The subscapularis tendon was evaluated  in  both static and dynamic motion without significant high-grade tearing.   The supraspinatus was not evaluated both over anterior, posterior and  interval views with evidence of articular sided partial tearing near the  insertion.  There is no low-grade tearing or tendon retraction.  The  infraspinatus was seen with evidence of high-grade partial tearing with  hypoechoic fluid both superior and inferior to the tendon indicative of a  small effusion from the likely underlying tear near the crossover of the  infraspinatus and supraspinatus critical zone.  The  teres minor was seen  without notable pathology.  The posterior glenohumeral joint was  visualized without notable effusion or joint abnormality.   PATIENT SURVEYS:  Quick Dash 40/55=66% disability  COGNITION: Overall cognitive status: Within functional limits for tasks assessed     SENSATION: WFL  POSTURE: Forward head, rounded shoulders  UPPER EXTREMITY ROM:   Active ROM Right eval Left eval Rt 07/10/23  Shoulder flexion 110 P 135 145 112  Shoulder extension     Shoulder abduction   76  Shoulder adduction     Shoulder internal rotation L4 T8 Mid lumbar   Shoulder external rotation C7 T3 Side of neck   Elbow flexion     Elbow extension     Wrist flexion     Wrist extension     Wrist ulnar deviation     Wrist radial deviation     Wrist pronation     Wrist supination      (Blank rows = not tested)  UPPER EXTREMITY MMT:  R shoulder strength was limited with report of pain MMT Right eval Left eval Rt. 07/10/23  Shoulder flexion P  3-/5  Shoulder extension P    Shoulder abduction P  3-/5 P   Shoulder adduction P    Shoulder internal rotation P  4+/5  Shoulder external rotation P  4/5 Pain   Middle trapezius     Lower trapezius     Elbow flexion     Elbow extension     Wrist flexion     Wrist extension     Wrist ulnar deviation     Wrist radial deviation     Wrist pronation     Wrist supination     Grip  strength (lbs)     (Blank rows = not tested)  SHOULDER SPECIAL TESTS: Impingement tests: Hawkins/Kennedy impingement test: negative SLAP lesions: NT Instability tests: NT Rotator cuff assessment: Empty can test: negative and Full can test: negative Biceps assessment: Yergason's test: negative  JOINT MOBILITY TESTING:  Appropriate GH jt stability  PALPATION:  TTP AC jt  Able to lift elbo                                                                                                                           TREATMENT DATE:  [facility] Adult PT Treatment:                                                DATE: 08/06/23 Therapeutic Activity: UBE for 5 min level 1  Row and extension in standing GTB 2 x10 ER 2 x 10 red band done bilateral ER with shoulder flexion 5 x 2 sidefacing each UE  Horizontal pull red band  x 15  Red looped triceps  Blue band biceps  Sidelying blue band UTR Rt UE only  Modalities: Iontophoresis 4 mg/ml L superior/lateral GH jt for 6 hours   Manual Therapy: Rt upper trap, lateral cervical spine Gentle manual traction to see if sensory symptoms could be influenced.   [facility] Adult PT Treatment:                                                DATE: 07/29/23 Therapeutic Activity: UBE for 5 min level 1  Row and extension in standing GTB 2 x10 ER 2 x 10 red band done bilateral ER with shoulder flexion 5 x 2 sidefacing each UE  Horizontal pull red band  x 15  Red looped triceps  Blue band biceps  Sidelying blue band UTR Rt UE only  Modalities: Iontophoresis 4 mg/ml L superior/lateral GH jt for 6 hours   Manual Therapy: Rt upper trap, lateral cervical spine Gentle manual traction to see if sensory symptoms could be influenced.     PATIENT EDUCATION: Education details: Eval findings, POC, HEP, self care Person educated: Patient Education method: Explanation, Demonstration, Tactile cues, Verbal cues, and Handouts Education comprehension: verbalized understanding,  returned demonstration, verbal cues required, and tactile cues required  HOME EXERCISE PROGRAM: Access Code: [email or web address] URL: [email or web address] Date: 07/11/2023 Prepared by: Liborio Reeds  Exercises - Flexion-Extension Shoulder Pendulum with Table Support  - 3 x daily - 7 x weekly - 1 sets - 20 reps - Shoulder Extension with Resistance  - 1 x daily - 7 x weekly - 3 sets - 10 reps - 2 hold - Standing Shoulder Row with Anchored Resistance  - 1 x daily - 7 x weekly - 2 sets - 10 reps - 5 hold - Supine Shoulder Flexion Extension AAROM with Dowel  - 1 x daily - 7 x weekly - 2 sets - 10 reps - 10 hold - Supine Shoulder External Rotation with Dowel  - 1 x daily - 7 x weekly - 2 sets - 10 reps - 10 hold - Seated Shoulder Scaption Slide at Table Top with Forearm in Neutral  - 1 x daily - 7 x weekly - 2 sets - 10 reps - 10 hold - Sidelying Shoulder External Rotation  - 1 x daily - 7 x weekly - 2 sets - 10 reps - 2 hold - Sidelying Shoulder Abduction Palm Forward  - 1 x daily - 7 x weekly - 2 sets - 10 reps - 2 hold - Seated Upper Trapezius Stretch  - 1 x daily - 7 x weekly - 1 sets - 3 reps - 20 hold  ASSESSMENT:  CLINICAL IMPRESSION: Patient able to tolerate light strengthening exercises for Rt and Lt UE.  She is interested in the [facility] as an adjunct for her Rt UE ( she also has bilateral knee pain).  Pt continues to do her home program, but says now the numbness in her Rt UE is fairly constant.  She has increased upper trap tension and pain which may be a contributing factor.  She reports feeling lighter and more relaxed after manual session.  Pt will make 2 x 2 more weeks of appts and may reach back out to her MD for follow up    OBJECTIVE IMPAIRMENTS: decreased activity tolerance, decreased ROM, decreased strength, impaired UE functional use, obesity, and pain.   ACTIVITY LIMITATIONS: carrying, lifting, sleeping, reach over head, and caring for others  PARTICIPATION LIMITATIONS:  meal prep, cleaning, laundry, and driving  PERSONAL FACTORS: Fitness, Past/current experiences, Social background, and 1 comorbidity: OA are also affecting patient's functional outcome.   REHAB POTENTIAL: Good  CLINICAL DECISION MAKING: Stable/uncomplicated  EVALUATION COMPLEXITY: Low   GOALS:  SHORT TERM GOALS=LTGs  LONG TERM GOALS: Target date: 07/12/23  Pt will be Ind in a final HEP to maintain achieved LOF  Baseline: started Goal status: ongoing   2.  Increase R shoulder AROM to flex 125d, ER T2, IR L2 for improved functional use Baseline: See flow sheets Goal status:ongoing   3.  Pt will demonstrate OH reach c 2# for functional use with household activities Baseline: Limited Goal status: ongoing   4.  Pt will report 50% or greater decrease in R shoulder pain for improved function and QOL Baseline: 0-9/10 Goal status: ongoing   5.  Pt's Quick Dash score will improve by the MCID to 50% as indication of improved function  Baseline: 66% disability 07/11/23: 64% Goal status: Ongoing PLAN:  PT FREQUENCY: 2x/week  PT DURATION: 6 weeks  PLANNED INTERVENTIONS: 97164- PT Re-evaluation, 97110-Therapeutic exercises, 97530- Therapeutic activity, 97535- Self Care, 16109- Manual therapy, [id number]- Aquatic Therapy, U0454- Electrical stimulation (unattended), [id number]- Ionotophoresis 4mg /ml Dexamethasone , Patient/Family education, Taping, Dry Needling, Joint mobilization, Cryotherapy, and Moist heat  PLAN FOR NEXT SESSION:  manual, ionto. Consider aquatics   AAROM and strength as tolerated. Assess response to HEP; progress therex as indicated; use of modalities, manual therapy; and TPDN as indicated.   Marci Setter, PT 08/06/23 6:01 AM Phone: [phone] Fax: [phone]

## 2023-08-08 ENCOUNTER — Ambulatory Visit: Admitting: Physical Therapy

## 2023-08-08 ENCOUNTER — Encounter: Payer: Self-pay | Admitting: Physical Therapy

## 2023-08-08 DIAGNOSIS — M25511 Pain in right shoulder: Secondary | ICD-10-CM | POA: Diagnosis not present

## 2023-08-08 DIAGNOSIS — M25611 Stiffness of right shoulder, not elsewhere classified: Secondary | ICD-10-CM

## 2023-08-08 DIAGNOSIS — G8929 Other chronic pain: Secondary | ICD-10-CM

## 2023-08-08 NOTE — Therapy (Addendum)
 OUTPATIENT PHYSICAL THERAPY SHOULDER NOTE/DC  Patient Name: Jenna [name] MRN: [id number] DOB:December 04, 1970, 53 y.o., female Today's Date: 08/08/2023  END OF SESSION:  PT End of Session - 08/08/23 1336     Visit Number 6    Number of Visits 13    Date for PT Re-Evaluation 08/21/23    Authorization Type [employer]    Authorization - Visit Number 6    Authorization - Number of Visits 27    PT Start Time 0134   pt late   PT Stop Time 0159    PT Time Calculation (min) 25 min                Past Medical History:  Diagnosis Date   Arthritis    Back pain    Back pain    Chest pain    Depression    Edema of both lower extremities    Joint pain    Left arm numbness    Numbness in both hands    Osteoarthritis    Prediabetes    SOB (shortness of breath)    Vitamin D  deficiency    Past Surgical History:  Procedure Laterality Date   CHOLECYSTECTOMY     COLONOSCOPY     COLONOSCOPY WITH PROPOFOL  N/A 07/19/2022   Procedure: COLONOSCOPY WITH PROPOFOL ;  Surgeon: Stacia Glendia BRAVO, MD;  Location: [facility] ENDOSCOPY;  Service: Gastroenterology;  Laterality: N/A;   COLONOSCOPY WITH PROPOFOL  N/A 10/25/2022   Procedure: COLONOSCOPY WITH PROPOFOL ;  Surgeon: Mansouraty, Aloha Raddle., MD;  Location: [facility] ENDOSCOPY;  Service: Gastroenterology;  Laterality: N/A;   COLONOSCOPY WITH PROPOFOL  N/A 06/20/2023   Procedure: COLONOSCOPY WITH PROPOFOL ;  Surgeon: Wilhelmenia Aloha Raddle., MD;  Location: [facility] ENDOSCOPY;  Service: Gastroenterology;  Laterality: N/A;   ENDOSCOPIC MUCOSAL RESECTION N/A 10/25/2022   Procedure: ENDOSCOPIC MUCOSAL RESECTION;  Surgeon: Wilhelmenia Aloha Raddle., MD;  Location: [facility] ENDOSCOPY;  Service: Gastroenterology;  Laterality: N/A;   HEMOSTASIS CLIP PLACEMENT  10/25/2022   Procedure: HEMOSTASIS CLIP PLACEMENT;  Surgeon: Wilhelmenia Aloha Raddle., MD;  Location: [facility] ENDOSCOPY;  Service: Gastroenterology;;   HOT HEMOSTASIS N/A 10/25/2022   Procedure: HOT HEMOSTASIS  (ARGON PLASMA COAGULATION/BICAP);  Surgeon: Wilhelmenia Aloha Raddle., MD;  Location: [facility]OSCOPY;  Service: Gastroenterology;  Laterality: N/A;   POLYPECTOMY  07/19/2022   Procedure: POLYPECTOMY;  Surgeon: Stacia Glendia BRAVO, MD;  Location: [facility] ENDOSCOPY;  Service: Gastroenterology;;   POLYPECTOMY  06/20/2023   Procedure: POLYPECTOMY, INTESTINE;  Surgeon: Wilhelmenia Aloha Raddle., MD;  Location: [facility] ENDOSCOPY;  Service: Gastroenterology;;   SUBMUCOSAL INJECTION  06/20/2023   Procedure: INJECTION, SUBMUCOSAL;  Surgeon: Wilhelmenia Aloha Raddle., MD;  Location: [facility] ENDOSCOPY;  Service: Gastroenterology;;   SUBMUCOSAL LIFTING INJECTION  07/19/2022   Procedure: SUBMUCOSAL LIFTING INJECTION;  Surgeon: Stacia Glendia BRAVO, MD;  Location: [facility] ENDOSCOPY;  Service: Gastroenterology;;   SUBMUCOSAL LIFTING INJECTION  10/25/2022   Procedure: SUBMUCOSAL LIFTING INJECTION;  Surgeon: Wilhelmenia Aloha Raddle., MD;  Location: [facility] ENDOSCOPY;  Service: Gastroenterology;;   SUBMUCOSAL TATTOO INJECTION  10/25/2022   Procedure: SUBMUCOSAL TATTOO INJECTION;  Surgeon: Wilhelmenia Aloha Raddle., MD;  Location: [facility] ENDOSCOPY;  Service: Gastroenterology;;   TUBAL LIGATION     Patient Active Problem List   Diagnosis Date Noted   History of colonic polyps 06/20/2023   Adenomatous polyp of ascending colon 06/20/2023   Weight gain due to medication 05/27/2023   Elevated blood pressure reading without diagnosis of hypertension 04/02/2023   Abnormal food appetite 04/02/2023   OSA (obstructive sleep apnea) 11/08/2022  Bilateral leg edema 07/23/2022   Benign neoplasm of ascending colon 07/19/2022   Acute kidney injury (HCC) 03/28/2022   Prediabetes 03/28/2022   Other spondylosis with radiculopathy, cervical region 10/03/2021   Fecal occult blood test positive 07/15/2021   Periodic limb movement 07/11/2021   Loud snoring 01/23/2021   Severe obesity (BMI >= 40) (HCC) 02/08/2020   Insulin  resistance 07/28/2019   Depression  07/28/2019   Vitamin D  deficiency 12/09/2018   Class 3 severe obesity without serious comorbidity with body mass index (BMI) of 50.0 to 59.9 in adult 01/17/2017   Low back pain 05/17/2016   Unilateral primary osteoarthritis, left knee 05/08/2016   Primary osteoarthritis of both knees 06/27/2015   Closed right fibular fracture 08/10/2014    PCP: Vicci Barnie NOVAK, MD   REFERRING PROVIDER: Vernetta Lonni GRADE, MD   REFERRING DIAG: M25.511 (ICD-10-CM) - Acute pain of right shoulder   THERAPY DIAG:  Chronic right shoulder pain  Stiffness of right shoulder, not elsewhere classified  Rationale for Evaluation and Treatment: Rehabilitation  ONSET DATE: 1.5 months  SUBJECTIVE:                                                                                                                                                                                      SUBJECTIVE STATEMENT: No new complaints. Rt shoulder pain is 4/10.   No precipitating incident. Experiences popping and her R shoulder will get stuck.  Hand dominance: Right  PERTINENT HISTORY: OA, sciatic nerve/back pain   PAIN:  Are you having pain? Yes: NPRS scale: ave 6/10-7/10 Pain location: Superior R GH and lateral shoulder Pain description: Ache and sharp Aggravating factors: reaching in multiple directions Relieving factors: Pulling R arm across body and Voltaren  , heat   PRECAUTIONS: None  RED FLAGS: None   WEIGHT BEARING RESTRICTIONS: No  FALLS:  Has patient fallen in last 6 months? No  LIVING ENVIRONMENT: Lives with: lives with an adult companion Lives in: House/apartment   OCCUPATION: Disability  PLOF: Independent  PATIENT GOALS:Pain relief  NEXT MD VISIT:   OBJECTIVE:  Note: Objective measures were completed at Evaluation unless otherwise noted.  DIAGNOSTIC FINDINGS:  XR R shoulder 05/08/23 3 views of the right shoulder send no acute findings.  The subacromial  outlet is reduced and  there is significant AC joint arthritic changes.   The glenohumeral joint is well-maintained.   Msk US  07/08/23 The AC joint was visualized with notable arthritic spurring and a small  effusion present.  The biceps tendon was visualized within the bicipital  groove both in longitudinal and transverse axis with some tendinopathy but  no evidence of high-grade tearing.  The subscapularis tendon was evaluated  in both static and dynamic motion without significant high-grade tearing.   The supraspinatus was not evaluated both over anterior, posterior and  interval views with evidence of articular sided partial tearing near the  insertion.  There is no low-grade tearing or tendon retraction.  The  infraspinatus was seen with evidence of high-grade partial tearing with  hypoechoic fluid both superior and inferior to the tendon indicative of a  small effusion from the likely underlying tear near the crossover of the  infraspinatus and supraspinatus critical zone.  The teres minor was seen  without notable pathology.  The posterior glenohumeral joint was  visualized without notable effusion or joint abnormality.   PATIENT SURVEYS:  Quick Dash 40/55=66% disability  COGNITION: Overall cognitive status: Within functional limits for tasks assessed     SENSATION: WFL  POSTURE: Forward head, rounded shoulders  UPPER EXTREMITY ROM:   Active ROM Right eval Left eval Rt 07/10/23 RT 08/08/23  Shoulder flexion 110 P 135 145 112 155  Shoulder extension      Shoulder abduction   76 95  Shoulder adduction      Shoulder internal rotation L4 T8 Mid lumbar    Shoulder external rotation C7 T3 Side of neck  Base of neck   Elbow flexion      Elbow extension      Wrist flexion      Wrist extension      Wrist ulnar deviation      Wrist radial deviation      Wrist pronation      Wrist supination       (Blank rows = not tested)  UPPER EXTREMITY MMT:  R shoulder strength was limited with report  of pain MMT Right eval Left eval Rt. 07/10/23  Shoulder flexion P  3-/5  Shoulder extension P    Shoulder abduction P  3-/5 P   Shoulder adduction P    Shoulder internal rotation P  4+/5  Shoulder external rotation P  4/5 Pain   Middle trapezius     Lower trapezius     Elbow flexion     Elbow extension     Wrist flexion     Wrist extension     Wrist ulnar deviation     Wrist radial deviation     Wrist pronation     Wrist supination     Grip strength (lbs)     (Blank rows = not tested)  SHOULDER SPECIAL TESTS: Impingement tests: Hawkins/Kennedy impingement test: negative SLAP lesions: NT Instability tests: NT Rotator cuff assessment: Empty can test: negative and Full can test: negative Biceps assessment: Yergason's test: negative  JOINT MOBILITY TESTING:  Appropriate GH jt stability  PALPATION:  TTP AC jt  Able to lift elbo  TREATMENT DATE:  [facility] Adult PT Treatment:                                                DATE: 08/08/23 Therapeutic Exercise: UBE Level L x 2 min each way  Row GTB  EXT GTB Wall slide shoulder flex x 10, scapt x 5  Standing AAROM abdct with dowel x 10 Standing horiz abdct Red band 10 x 2  Standing shoulder ER bilat Red 5 x 2  Side lying shoulder abdct AROM, 1# x 10 Side lying ER 1# 10 x 2      [facility] Adult PT Treatment:                                                DATE: 07/29/23 Therapeutic Activity: UBE for 5 min level 1  Row and extension in standing GTB 2 x10 ER 2 x 10 red band done bilateral ER with shoulder flexion 5 x 2 sidefacing each UE  Horizontal pull red band  x 15  Red looped triceps  Blue band biceps  Sidelying blue band UTR Rt UE only  Modalities: Iontophoresis 4 mg/ml L superior/lateral GH jt for 6 hours   Manual Therapy: Rt upper trap, lateral cervical spine Gentle manual traction to see if sensory  symptoms could be influenced.     PATIENT EDUCATION: Education details: Eval findings, POC, HEP, self care Person educated: Patient Education method: Explanation, Demonstration, Tactile cues, Verbal cues, and Handouts Education comprehension: verbalized understanding, returned demonstration, verbal cues required, and tactile cues required  HOME EXERCISE PROGRAM: Access Code: [email or web address] URL: [email or web address] Date: 07/11/2023 Prepared by: Dasie Daft  Exercises - Flexion-Extension Shoulder Pendulum with Table Support  - 3 x daily - 7 x weekly - 1 sets - 20 reps - Shoulder Extension with Resistance  - 1 x daily - 7 x weekly - 3 sets - 10 reps - 2 hold - Standing Shoulder Row with Anchored Resistance  - 1 x daily - 7 x weekly - 2 sets - 10 reps - 5 hold - Supine Shoulder Flexion Extension AAROM with Dowel  - 1 x daily - 7 x weekly - 2 sets - 10 reps - 10 hold - Supine Shoulder External Rotation with Dowel  - 1 x daily - 7 x weekly - 2 sets - 10 reps - 10 hold - Seated Shoulder Scaption Slide at Table Top with Forearm in Neutral  - 1 x daily - 7 x weekly - 2 sets - 10 reps - 10 hold - Sidelying Shoulder External Rotation  - 1 x daily - 7 x weekly - 2 sets - 10 reps - 2 hold - Sidelying Shoulder Abduction Palm Forward  - 1 x daily - 7 x weekly - 2 sets - 10 reps - 2 hold - Seated Upper Trapezius Stretch  - 1 x daily - 7 x weekly - 1 sets - 3 reps - 20 hold  ASSESSMENT:  CLINICAL IMPRESSION: Pt arrived late due to appt time confusion. She demonstrates increased shoulder AROM. Less compliance with strengthening this week due to being out of town. Reviewed HEP today and added resistance to side lying. She fatigued but tolerated well.   Patient able to  tolerate light strengthening exercises for Rt and Lt UE.  She is interested in the [facility] as an adjunct for her Rt UE ( she also has bilateral knee pain).  Pt continues to do her home program, but says now the numbness in her Rt  UE is fairly constant.  She has increased upper trap tension and pain which may be a contributing factor.  She reports feeling lighter and more relaxed after manual session.  Pt will make 2 x 2 more weeks of appts and may reach back out to her MD for follow up    OBJECTIVE IMPAIRMENTS: decreased activity tolerance, decreased ROM, decreased strength, impaired UE functional use, obesity, and pain.   ACTIVITY LIMITATIONS: carrying, lifting, sleeping, reach over head, and caring for others  PARTICIPATION LIMITATIONS: meal prep, cleaning, laundry, and driving  PERSONAL FACTORS: Fitness, Past/current experiences, Social background, and 1 comorbidity: OA are also affecting patient's functional outcome.   REHAB POTENTIAL: Good  CLINICAL DECISION MAKING: Stable/uncomplicated  EVALUATION COMPLEXITY: Low   GOALS:  SHORT TERM GOALS=LTGs  LONG TERM GOALS: Target date: 08/21/23  Pt will be Ind in a final HEP to maintain achieved LOF  Baseline: started Goal status: ongoing   2.  Increase R shoulder AROM to flex 125d, ER T2, IR L2 for improved functional use Baseline: See flow sheets Goal status:ongoing   3.  Pt will demonstrate OH reach c 2# for functional use with household activities Baseline: Limited Goal status: ongoing   4.  Pt will report 50% or greater decrease in R shoulder pain for improved function and QOL Baseline: 0-9/10 Goal status: ongoing   5.  Pt's Quick Dash score will improve by the MCID to 50% as indication of improved function  Baseline: 66% disability 07/11/23: 64% Goal status: Ongoing PLAN:  PT FREQUENCY: 2x/week  PT DURATION: 6 weeks  PLANNED INTERVENTIONS: 97164- PT Re-evaluation, 97110-Therapeutic exercises, 97530- Therapeutic activity, 97535- Self Care, 02859- Manual therapy, [id number]- Aquatic Therapy, H9716- Electrical stimulation (unattended), [phone]- Ionotophoresis 4mg /ml Dexamethasone , Patient/Family education, Taping, Dry Needling, Joint mobilization,  Cryotherapy, and Moist heat  PLAN FOR NEXT SESSION:  manual, ionto. Consider aquatics   AAROM and strength as tolerated. Assess response to HEP; progress therex as indicated; use of modalities, manual therapy; and TPDN as indicated.    Harlene Persons, PTA 08/08/23 1:58 PM Phone: [phone] Fax: [phone]   PHYSICAL THERAPY DISCHARGE SUMMARY  Visits from Start of Care: 5  Current functional level related to goals / functional outcomes: Unknown   Remaining deficits: Unknown   Education / Equipment: HEP/Pt Ed   Patient agrees to discharge. Patient goals were not met. Patient is being discharged due to not returning since the last visit.  Allen Ralls MS, PT 02/27/2024 9:55 AM

## 2023-08-12 ENCOUNTER — Encounter (INDEPENDENT_AMBULATORY_CARE_PROVIDER_SITE_OTHER): Payer: Self-pay | Admitting: Internal Medicine

## 2023-08-12 ENCOUNTER — Telehealth (INDEPENDENT_AMBULATORY_CARE_PROVIDER_SITE_OTHER): Admitting: Internal Medicine

## 2023-08-12 VITALS — Ht 65.0 in | Wt 315.0 lb

## 2023-08-12 DIAGNOSIS — R7303 Prediabetes: Secondary | ICD-10-CM | POA: Diagnosis not present

## 2023-08-12 DIAGNOSIS — Z6841 Body Mass Index (BMI) 40.0 and over, adult: Secondary | ICD-10-CM

## 2023-08-12 DIAGNOSIS — R638 Other symptoms and signs concerning food and fluid intake: Secondary | ICD-10-CM | POA: Diagnosis not present

## 2023-08-12 DIAGNOSIS — E66813 Obesity, class 3: Secondary | ICD-10-CM | POA: Diagnosis not present

## 2023-08-12 MED ORDER — LOMAIRA 8 MG PO TABS
8.0000 mg | ORAL_TABLET | Freq: Every day | ORAL | 0 refills | Status: DC
Start: 2023-08-12 — End: 2023-09-26

## 2023-08-12 NOTE — Assessment & Plan Note (Signed)
 Her last A1c was 5.8 and improved from previous 6.3 but patient is at risk for progression.  She had been on metformin  in the past but medication was discontinued because of increase in bicarbonate while also taking topiramate .  Her insurance no longer covers GLP-1 so she does not have anything to take for pharmacoprophylaxis she will continue to work on nutrition and behavioral strategies for diabetes prevention.  She is certainly high risk for progression.  We may consider acarbose.

## 2023-08-12 NOTE — Progress Notes (Signed)
 Virtual Visit via Video Note  I connected withNAME@ on 08/12/23 at  8:40 AM EDT by a video enabled telemedicine application and verified that I am speaking with the correct person using two identifiers.  Location:  Patient: at home Provider: Office   I discussed the limitations of evaluation and management by telemedicine and the availability of in person appointments. The patient expressed understanding and agreed to proceed.    Weight Summary And Biometrics  No data recorded Anthropometric Measurements Height: 5\' 5"  (1.651 m) Weight: (!) 315 lb (142.9 kg) (reported by pt via telehealth) BMI (Calculated): 52.42 Weight at Last Visit: 322 lb Starting Weight: 321 lb Peak Weight: 321   No data recorded  No data recorded Today's Visit #: 19 - Telehealth  Starting Date: 03/14/22   Subjective   Chief Complaint: Obesity  Jenna [name] is here to discuss her progress with her obesity treatment plan. She is on the the Category 3 Plan and states she is following her eating plan approximately 90 % of the time. She states she is exercising walking 5-10  minutes 4-5 times per week.  Weight Progress Since Last Visit:  Since last office visit she has lost 7 pounds. She reports fair adherence to reduced calorie nutritional plan. She has been working on drinking more water, making healthier choices, reducing portion sizes, incorporating more whole foods, is skipping meals, and not consuming the recommended amount of protein   Patient Reported Barriers to Progress: low volume of physical activity at present  and orthopedic problems, medical conditions or chronic pain affecting mobility.   Jenna [name]: Reports improved problems with appetite and hunger signals.  Reports improved problems with satiety and satiation.  Denies problems with eating patterns and portion [name].  Denies abnormal cravings. Denies feeling deprived or restricted.   Pharmacotherapy for weight  management: She is currently taking Phentermine  (longterm use, single agent)  with adequate clinical response  and without side effects..   Assessment and Plan   Treatment Plan For Obesity:  Recommended Dietary Goals  Jenna [name] is currently in the action stage of change. As such, her goal is to continue weight management plan. She has agreed to: continue current plan  Behavioral Health and Counseling  We discussed the following behavioral modification strategies today: continue to work on maintaining a reduced calorie state, getting the recommended amount of protein, incorporating whole foods, making healthy choices, staying well hydrated and practicing mindfulness when eating..  Additional education and resources provided today: None  Recommended Physical Activity Goals  Jenna [name] has been advised to work up to 150 minutes of moderate intensity aerobic activity a week and strengthening exercises 2-3 times per week for cardiovascular health, weight loss maintenance and preservation of muscle mass.   She has agreed to :  Think about enjoyable ways to increase daily physical activity and overcoming barriers to exercise and Increase physical activity in their day and reduce sedentary time (increase NEAT).  Pharmacotherapy  We discussed various medication options to help Jenna with her weight loss efforts and we both agreed to : adequate clinical response to anti-obesity medication, continue current regimen  Associated Conditions Impacted by Obesity Treatment  Prediabetes Assessment & Plan: Her last A1c was 5.8 and improved from previous 6.3 but patient is at risk for progression.  She had been on metformin  in the past but medication was discontinued because of increase in bicarbonate while also taking topiramate .  Her insurance no longer covers GLP-1 so she does not have anything to take  for pharmacoprophylaxis she will continue to work on nutrition and behavioral strategies for  diabetes prevention.  She is certainly high risk for progression.  We may consider acarbose.   Abnormal food appetite Assessment & Plan: Improved on  lomaira . She has increased Jenna signaling, impaired satiety and inhibitory [name]. This is secondary to an abnormal energy regulation system and pathological neurohormonal pathways characteristic of excess adiposity.  In addition to nutritional and behavioral strategies she benefits from pharmacotherapy.  She no longer has GLP-1 coverage.  Continue Lomaira  at 8 mg once a day.  No adverse effects reported    Class 3 severe obesity with serious comorbidity and body mass index (BMI) of 50.0 to 59.9 in adult, unspecified obesity type Assessment & Plan: She has lost 7 pounds since starting Lomaira  8 mg once a day without any adverse effects.  She is also on topiramate  for other indications.   1.  She will work on not skipping meals as this may result in metabolic slowing 2.  Reduce simple and added sugars in diet 3.  Increase protein to establish goals at every meal 4.  Increase volume of physical activity  Orders: -     Lomaira ; Take 1 tablet (8 mg total) by mouth daily.  Dispense: 28 tablet; Refill: 0     Objective   Physical Exam:  Height 5\' 5"  (1.651 m), weight (!) 315 lb (142.9 kg), last menstrual period 07/30/2023. Body mass index is 52.42 kg/m.  General: She is overweight, cooperative, alert, well developed, and in no acute distress. PSYCH: Has normal mood, affect and thought process.   Lungs: Normal breathing effort, no conversational dyspnea.    Diagnostic Data Reviewed:  BMET    Component Value Date/Time   NA 140 07/23/2022 1213   K 4.7 07/23/2022 1213   CL 108 (H) 07/23/2022 1213   CO2 19 (L) 07/23/2022 1213   GLUCOSE 105 (H) 07/23/2022 1213   GLUCOSE 118 (H) 12/20/2021 1430   BUN 8 07/23/2022 1213   CREATININE 0.89 07/23/2022 1213   CALCIUM 9.0 07/23/2022 1213   GFRNONAA 55 (L) 12/20/2021 1430   GFRAA >60  09/25/2019 1548   Lab Results  Component Value Date   HGBA1C 5.8 04/04/2023   HGBA1C 5.6 12/03/2017   Lab Results  Component Value Date   INSULIN  41.6 (H) 03/14/2022   INSULIN  7.4 12/08/2018   Lab Results  Component Value Date   TSH 1.655 12/20/2021   CBC    Component Value Date/Time   WBC 8.1 12/20/2021 1430   RBC 4.57 12/20/2021 1430   HGB 12.5 12/20/2021 1430   HGB 12.5 01/23/2021 1525   HCT 38.9 12/20/2021 1430   HCT 37.4 01/23/2021 1525   PLT 333 12/20/2021 1430   PLT 284 01/23/2021 1525   MCV 85.1 12/20/2021 1430   MCV 88 01/23/2021 1525   MCH 27.4 12/20/2021 1430   MCHC 32.1 12/20/2021 1430   RDW 14.6 12/20/2021 1430   RDW 15.1 01/23/2021 1525   Iron Studies    Component Value Date/Time   IRON 53 04/02/2019 1510   TIBC 360 04/02/2019 1510   FERRITIN 26 04/02/2019 1510   IRONPCTSAT 15 04/02/2019 1510   Lipid Panel     Component Value Date/Time   CHOL 140 03/14/2022 1030   TRIG 68 03/14/2022 1030   HDL 46 03/14/2022 1030   CHOLHDL 3.8 10/06/2020 1458   LDLCALC 80 03/14/2022 1030   Hepatic Function Panel     Component Value Date/Time  PROT 6.8 07/23/2022 1213   ALBUMIN 3.9 07/23/2022 1213   AST 24 07/23/2022 1213   ALT 28 07/23/2022 1213   ALKPHOS 81 07/23/2022 1213   BILITOT 0.4 07/23/2022 1213      Component Value Date/Time   TSH 1.655 12/20/2021 1430   TSH 1.430 01/23/2021 1525   Nutritional Lab Results  Component Value Date   VD25OH 28.3 (L) 07/23/2022   VD25OH 14.6 (L) 03/14/2022   VD25OH 14.8 (L) 01/23/2021    Follow-Up   No follow-ups on file.Aaron Aas She was informed of the importance of frequent follow up visits to maximize her success with intensive lifestyle modifications for her multiple health conditions.  Attestation Statement   Reviewed by clinician on day of visit: allergies, medications, problem list, medical history, surgical history, family history, social history, and previous encounter notes.   I discussed the  assessment and treatment plan with the patient. The patient was provided an opportunity to ask questions and all were answered. The patient agreed with the plan and demonstrated an understanding of the instructions.   The patient was advised to call back or seek an in-person evaluation if the symptoms worsen or if the condition fails to improve as anticipated.   I have spent 21 minutes in the care of the patient today including: 2 minutes before the visit reviewing and preparing the chart. 15 minutes face-to-face assessing and reviewing listed medical problems as outlined in obesity care plan, providing nutritional and behavioral counseling on topics outlined in the obesity care plan, independently interpreting test results and goals of care, as described in assessment and plan, reviewing and discussing biometric information and progress, and ordering medications - see orders 4 minutes after the visit updating chart and documentation of encounter.    Ladd Picker, MD

## 2023-08-12 NOTE — Assessment & Plan Note (Deleted)
 She has lost 7 pounds according to monitoring at home on Lomaira  8 mg once daily without reported side effects.  She is also on topiramate  for other indications combine creates the effect of Qsymia.  - She will work on increasing volume of physical activity - Avoid skipping meals -Ensure adequate protein intake at every meal

## 2023-08-12 NOTE — Assessment & Plan Note (Signed)
 She has lost 7 pounds since starting Lomaira  8 mg once a day without any adverse effects.  She is also on topiramate  for other indications.   1.  She will work on not skipping meals as this may result in metabolic slowing 2.  Reduce simple and added sugars in diet 3.  Increase protein to establish goals at every meal 4.  Increase volume of physical activity

## 2023-08-12 NOTE — Assessment & Plan Note (Signed)
 Improved on  lomaira . She has increased orexigenic signaling, impaired satiety and inhibitory control. This is secondary to an abnormal energy regulation system and pathological neurohormonal pathways characteristic of excess adiposity.  In addition to nutritional and behavioral strategies she benefits from pharmacotherapy.  She no longer has GLP-1 coverage.  Continue Lomaira  at 8 mg once a day.  No adverse effects reported

## 2023-08-14 ENCOUNTER — Telehealth: Payer: Self-pay | Admitting: Physical Therapy

## 2023-08-14 ENCOUNTER — Ambulatory Visit: Attending: Orthopaedic Surgery | Admitting: Physical Therapy

## 2023-08-14 ENCOUNTER — Ambulatory Visit (HOSPITAL_BASED_OUTPATIENT_CLINIC_OR_DEPARTMENT_OTHER): Attending: Internal Medicine | Admitting: Internal Medicine

## 2023-08-14 NOTE — Telephone Encounter (Signed)
 No SHOW # 1 Pt called and pt states she is in the middle of a move and she is not able to come to aquatics today. Pt was explained the attendance policy and she verbally confirmed understanding of the attendance policy.

## 2023-08-14 NOTE — Telephone Encounter (Signed)
 No SHOW # 1 Pt called and pt states she is in the middle of a move and she is not able to come to aquatics today. Pt was explained the attendance policy and she verbally confirmed understanding of the attendance policy.  Sharlet Dawson, PT, A[facility] Certified Exercise Expert for the Aging Adult  08/14/23 2:04 PM Phone: [phone] Fax: [phone]

## 2023-08-15 NOTE — Therapy (Incomplete)
 OUTPATIENT PHYSICAL THERAPY SHOULDER NOTE   Patient Name: Jenna [name] MRN: [id number] DOB:August 22, 1970, 53 y.o., female Today's Date: 08/15/2023  END OF SESSION:       Past Medical History:  Diagnosis Date   Arthritis    Back pain    Back pain    Chest pain    Depression    Edema of both lower extremities    Joint pain    Left arm numbness    Numbness in both hands    Osteoarthritis    Prediabetes    SOB (shortness of breath)    Vitamin D  deficiency    Past Surgical History:  Procedure Laterality Date   CHOLECYSTECTOMY     COLONOSCOPY     COLONOSCOPY WITH PROPOFOL  N/A 07/19/2022   Procedure: COLONOSCOPY WITH PROPOFOL ;  Surgeon: Elois Hair, MD;  Location: [facility] ENDOSCOPY;  Service: Gastroenterology;  Laterality: N/A;   COLONOSCOPY WITH PROPOFOL  N/A 10/25/2022   Procedure: COLONOSCOPY WITH PROPOFOL ;  Surgeon: Brice Campi Albino Alu., MD;  Location: [facility] ENDOSCOPY;  Service: Gastroenterology;  Laterality: N/A;   COLONOSCOPY WITH PROPOFOL  N/A 06/20/2023   Procedure: COLONOSCOPY WITH PROPOFOL ;  Surgeon: Mansouraty, Albino Alu., MD;  Location: [facility] ENDOSCOPY;  Service: Gastroenterology;  Laterality: N/A;   ENDOSCOPIC MUCOSAL RESECTION N/A 10/25/2022   Procedure: ENDOSCOPIC MUCOSAL RESECTION;  Surgeon: Brice Campi Albino Alu., MD;  Location: [facility] ENDOSCOPY;  Service: Gastroenterology;  Laterality: N/A;   HEMOSTASIS CLIP PLACEMENT  10/25/2022   Procedure: HEMOSTASIS CLIP PLACEMENT;  Surgeon: Normie Becton., MD;  Location: [facility] ENDOSCOPY;  Service: Gastroenterology;;   HOT HEMOSTASIS N/A 10/25/2022   Procedure: HOT HEMOSTASIS (ARGON PLASMA COAGULATION/BICAP);  Surgeon: Normie Becton., MD;  Location: [facility] ENDOSCOPY;  Service: Gastroenterology;  Laterality: N/A;   POLYPECTOMY  07/19/2022   Procedure: POLYPECTOMY;  Surgeon: Elois Hair, MD;  Location: [facility] ENDOSCOPY;  Service: Gastroenterology;;   POLYPECTOMY  06/20/2023   Procedure: POLYPECTOMY, INTESTINE;   Surgeon: Normie Becton., MD;  Location: [facility] ENDOSCOPY;  Service: Gastroenterology;;   SUBMUCOSAL INJECTION  06/20/2023   Procedure: INJECTION, SUBMUCOSAL;  Surgeon: Normie Becton., MD;  Location: [facility] ENDOSCOPY;  Service: Gastroenterology;;   SUBMUCOSAL LIFTING INJECTION  07/19/2022   Procedure: SUBMUCOSAL LIFTING INJECTION;  Surgeon: Elois Hair, MD;  Location: [name] ENDOSCOPY;  Service: Gastroenterology;;   [place] LIFTING INJECTION  10/25/2022   Procedure: SUBMUCOSAL LIFTING INJECTION;  Surgeon: Normie Becton., MD;  Location: [facility] ENDOSCOPY;  Service: Gastroenterology;;   SUBMUCOSAL TATTOO INJECTION  10/25/2022   Procedure: SUBMUCOSAL TATTOO INJECTION;  Surgeon: Normie Becton., MD;  Location: [facility] ENDOSCOPY;  Service: Gastroenterology;;   TUBAL LIGATION     Patient Active Problem List   Diagnosis Date Noted   History of colonic polyps 06/20/2023   Adenomatous polyp of ascending colon 06/20/2023   Weight gain due to medication 05/27/2023   Elevated blood pressure reading without diagnosis of hypertension 04/02/2023   Abnormal food appetite 04/02/2023   OSA (obstructive sleep apnea) 11/08/2022   Bilateral leg edema 07/23/2022   Benign neoplasm of ascending colon 07/19/2022   Acute kidney injury (HCC) 03/28/2022   Prediabetes 03/28/2022   Other spondylosis with radiculopathy, cervical region 10/03/2021   Fecal occult blood test positive 07/15/2021   Periodic limb movement 07/11/2021   Loud snoring 01/23/2021   Severe obesity (BMI >= 40) (HCC) 02/08/2020   Insulin  resistance 07/28/2019   Depression 07/28/2019   Vitamin D  deficiency 12/09/2018   Class 3 severe obesity with serious comorbidity and body mass index (BMI)  of 50.0 to 59.9 in adult, unspecified obesity type 01/17/2017   Low back pain 05/17/2016   Unilateral primary osteoarthritis, left knee 05/08/2016   Primary osteoarthritis of both knees 06/27/2015   Closed right fibular fracture  08/10/2014    PCP: Lawrance Presume, MD   REFERRING PROVIDER: Arnie Lao, MD   REFERRING DIAG: [phone] (ICD-10-CM) - Acute pain of right shoulder   THERAPY DIAG:  No diagnosis found.  Rationale for Evaluation and Treatment: Rehabilitation  ONSET DATE: 1.5 months  SUBJECTIVE:                                                                                                                                                                                      SUBJECTIVE STATEMENT: No new complaints. Rt shoulder pain is 4/10.   No precipitating incident. Experiences popping and her R shoulder will get stuck.  Hand dominance: Right  PERTINENT HISTORY: OA, sciatic nerve/back pain   PAIN:  Are you having pain? Yes: NPRS scale: ave 6/10-7/10 Pain location: Superior R GH and lateral shoulder Pain description: Ache and sharp Aggravating factors: reaching in multiple directions Relieving factors: Pulling R arm across body and Voltaren  , heat   PRECAUTIONS: None  RED FLAGS: None   WEIGHT BEARING RESTRICTIONS: No  FALLS:  Has patient fallen in last 6 months? No  LIVING ENVIRONMENT: Lives with: lives with an adult companion Lives in: House/apartment   OCCUPATION: Disability  PLOF: Independent  PATIENT GOALS:Pain relief  NEXT MD VISIT:   OBJECTIVE:  Note: Objective measures were completed at Evaluation unless otherwise noted.  DIAGNOSTIC FINDINGS:  XR R shoulder 05/08/23 3 views of the right shoulder send no acute findings.  The subacromial  outlet is reduced and there is significant AC joint arthritic changes.   The glenohumeral joint is well-maintained.   Msk US  07/08/23 The AC joint was visualized with notable arthritic spurring and a small  effusion present.  The biceps tendon was visualized within the bicipital  groove both in longitudinal and transverse axis with some tendinopathy but  no evidence of high-grade tearing.  The subscapularis  tendon was evaluated  in both static and dynamic motion without significant high-grade tearing.   The supraspinatus was not evaluated both over anterior, posterior and  interval views with evidence of articular sided partial tearing near the  insertion.  There is no low-grade tearing or tendon retraction.  The  infraspinatus was seen with evidence of high-grade partial tearing with  hypoechoic fluid both superior and inferior to the tendon indicative of a  small effusion from the likely underlying tear near the crossover of the  infraspinatus and supraspinatus critical  zone.  The teres minor was seen  without notable pathology.  The posterior glenohumeral joint was  visualized without notable effusion or joint abnormality.   PATIENT SURVEYS:  Quick Dash 40/55=66% disability  COGNITION: Overall cognitive status: Within functional limits for tasks assessed     SENSATION: WFL  POSTURE: Forward head, rounded shoulders  UPPER EXTREMITY ROM:   Active ROM Right eval Left eval Rt 07/10/23 RT 08/08/23  Shoulder flexion 110 P 135 145 112 155  Shoulder extension      Shoulder abduction   76 95  Shoulder adduction      Shoulder internal rotation L4 T8 Mid lumbar    Shoulder external rotation C7 T3 Side of neck  Base of neck   Elbow flexion      Elbow extension      Wrist flexion      Wrist extension      Wrist ulnar deviation      Wrist radial deviation      Wrist pronation      Wrist supination       (Blank rows = not tested)  UPPER EXTREMITY MMT:  R shoulder strength was limited with report of pain MMT Right eval Left eval Rt. 07/10/23  Shoulder flexion P  3-/5  Shoulder extension P    Shoulder abduction P  3-/5 P   Shoulder adduction P    Shoulder internal rotation P  4+/5  Shoulder external rotation P  4/5 Pain   Middle trapezius     Lower trapezius     Elbow flexion     Elbow extension     Wrist flexion     Wrist extension     Wrist ulnar deviation     Wrist  radial deviation     Wrist pronation     Wrist supination     Grip strength (lbs)     (Blank rows = not tested)  SHOULDER SPECIAL TESTS: Impingement tests: Hawkins/Kennedy impingement test: negative SLAP lesions: NT Instability tests: NT Rotator cuff assessment: Empty can test: negative and Full can test: negative Biceps assessment: Yergason's test: negative  JOINT MOBILITY TESTING:  Appropriate GH jt stability  PALPATION:  TTP AC jt  Able to lift elbo                                                                                                                           TREATMENT DATE:  [facility] Adult PT Treatment:                                                DATE: 08/16/23 Therapeutic Exercise: UBE Level L x 2 min each way  Row GTB  EXT GTB Wall slide shoulder flex x 10, scapt x 5  Standing AAROM abdct with dowel x 10 Standing horiz abdct Red band  10 x 2  Standing shoulder ER bilat Red 5 x 2  Side lying shoulder abdct AROM, 1# x 10 Side lying ER 1# 10 x 2  Therapeutic Exercise: *** Manual Therapy: *** Neuromuscular re-ed: *** Therapeutic Activity: *** Modalities: *** Self Care: ***  [facility] Adult PT Treatment:                                                DATE: 08/08/23 Therapeutic Exercise: UBE Level L x 2 min each way  Row GTB  EXT GTB Wall slide shoulder flex x 10, scapt x 5  Standing AAROM abdct with dowel x 10 Standing horiz abdct Red band 10 x 2  Standing shoulder ER bilat Red 5 x 2  Side lying shoulder abdct AROM, 1# x 10 Side lying ER 1# 10 x 2      [facility] Adult PT Treatment:                                                DATE: 07/29/23 Therapeutic Activity: UBE for 5 min level 1  Row and extension in standing GTB 2 x10 ER 2 x 10 red band done bilateral ER with shoulder flexion 5 x 2 sidefacing each UE  Horizontal pull red band  x 15  Red looped triceps  Blue band biceps  Sidelying blue band UTR Rt UE only  Modalities: Iontophoresis 4 mg/ml L  superior/lateral GH jt for 6 hours   Manual Therapy: Rt upper trap, lateral cervical spine Gentle manual traction to see if sensory symptoms could be influenced.     PATIENT EDUCATION: Education details: Eval findings, POC, HEP, self care Person educated: Patient Education method: Explanation, Demonstration, Tactile cues, Verbal cues, and Handouts Education comprehension: verbalized understanding, returned demonstration, verbal cues required, and tactile cues required  HOME EXERCISE PROGRAM: Access Code: [email or web address] URL: [email or web address] Date: 07/11/2023 Prepared by: Liborio Reeds  Exercises - Flexion-Extension Shoulder Pendulum with Table Support  - 3 x daily - 7 x weekly - 1 sets - 20 reps - Shoulder Extension with Resistance  - 1 x daily - 7 x weekly - 3 sets - 10 reps - 2 hold - Standing Shoulder Row with Anchored Resistance  - 1 x daily - 7 x weekly - 2 sets - 10 reps - 5 hold - Supine Shoulder Flexion Extension AAROM with Dowel  - 1 x daily - 7 x weekly - 2 sets - 10 reps - 10 hold - Supine Shoulder External Rotation with Dowel  - 1 x daily - 7 x weekly - 2 sets - 10 reps - 10 hold - Seated Shoulder Scaption Slide at Table Top with Forearm in Neutral  - 1 x daily - 7 x weekly - 2 sets - 10 reps - 10 hold - Sidelying Shoulder External Rotation  - 1 x daily - 7 x weekly - 2 sets - 10 reps - 2 hold - Sidelying Shoulder Abduction Palm Forward  - 1 x daily - 7 x weekly - 2 sets - 10 reps - 2 hold - Seated Upper Trapezius Stretch  - 1 x daily - 7 x weekly - 1 sets - 3 reps - 20 hold  ASSESSMENT:  CLINICAL IMPRESSION:  Pt arrived late due to appt time confusion. She demonstrates increased shoulder AROM. Less compliance with strengthening this week due to being out of town. Reviewed HEP today and added resistance to side lying. She fatigued but tolerated well.   Patient able to tolerate light strengthening exercises for Rt and Lt UE.  She is interested in the [facility] as  an adjunct for her Rt UE ( she also has bilateral knee pain).  Pt continues to do her home program, but says now the numbness in her Rt UE is fairly constant.  She has increased upper trap tension and pain which may be a contributing factor.  She reports feeling lighter and more relaxed after manual session.  Pt will make 2 x 2 more weeks of appts and may reach back out to her MD for follow up    OBJECTIVE IMPAIRMENTS: decreased activity tolerance, decreased ROM, decreased strength, impaired UE functional use, obesity, and pain.   ACTIVITY LIMITATIONS: carrying, lifting, sleeping, reach over head, and caring for others  PARTICIPATION LIMITATIONS: meal prep, cleaning, laundry, and driving  PERSONAL FACTORS: Fitness, Past/current experiences, Social background, and 1 comorbidity: OA are also affecting patient's functional outcome.   REHAB POTENTIAL: Good  CLINICAL DECISION MAKING: Stable/uncomplicated  EVALUATION COMPLEXITY: Low   GOALS:  SHORT TERM GOALS=LTGs  LONG TERM GOALS: Target date: 08/21/23  Pt will be Ind in a final HEP to maintain achieved LOF  Baseline: started Goal status: ongoing   2.  Increase R shoulder AROM to flex 125d, ER T2, IR L2 for improved functional use Baseline: See flow sheets Goal status:ongoing   3.  Pt will demonstrate OH reach c 2# for functional use with household activities Baseline: Limited Goal status: ongoing   4.  Pt will report 50% or greater decrease in R shoulder pain for improved function and QOL Baseline: 0-9/10 Goal status: ongoing   5.  Pt's Quick Dash score will improve by the MCID to 50% as indication of improved function  Baseline: 66% disability 07/11/23: 64% Goal status: Ongoing PLAN:  PT FREQUENCY: 2x/week  PT DURATION: 6 weeks  PLANNED INTERVENTIONS: 97164- PT Re-evaluation, 97110-Therapeutic exercises, 97530- Therapeutic activity, 97535- Self Care, 40981- Manual therapy, [id number]- Aquatic Therapy, [id number]- Electrical  stimulation (unattended), 97033- Ionotophoresis 4mg /ml Dexamethasone , Patient/Family education, Taping, Dry Needling, Joint mobilization, Cryotherapy, and Moist heat  PLAN FOR NEXT SESSION:  manual, ionto. Consider aquatics   AAROM and strength as tolerated. Assess response to HEP; progress therex as indicated; use of modalities, manual therapy; and TPDN as indicated.    Rayen Dafoe MS, PT 08/15/23 7:01 PM

## 2023-08-16 ENCOUNTER — Ambulatory Visit

## 2023-08-21 ENCOUNTER — Ambulatory Visit: Admitting: Physical Therapy

## 2023-08-22 ENCOUNTER — Other Ambulatory Visit: Payer: Self-pay | Admitting: Internal Medicine

## 2023-08-22 DIAGNOSIS — Z1231 Encounter for screening mammogram for malignant neoplasm of breast: Secondary | ICD-10-CM

## 2023-08-23 ENCOUNTER — Encounter

## 2023-08-23 ENCOUNTER — Ambulatory Visit

## 2023-08-23 DIAGNOSIS — Z1231 Encounter for screening mammogram for malignant neoplasm of breast: Secondary | ICD-10-CM

## 2023-08-26 ENCOUNTER — Other Ambulatory Visit: Payer: Self-pay | Admitting: Internal Medicine

## 2023-08-26 DIAGNOSIS — T50905A Adverse effect of unspecified drugs, medicaments and biological substances, initial encounter: Secondary | ICD-10-CM

## 2023-08-27 MED ORDER — HYDROXYZINE HCL 25 MG PO TABS
25.0000 mg | ORAL_TABLET | Freq: Four times a day (QID) | ORAL | 0 refills | Status: DC
Start: 2023-08-27 — End: 2024-01-09
  Filled 2023-08-27: qty 30, 4d supply, fill #0

## 2023-08-28 ENCOUNTER — Other Ambulatory Visit: Payer: Self-pay

## 2023-09-02 ENCOUNTER — Telehealth (HOSPITAL_BASED_OUTPATIENT_CLINIC_OR_DEPARTMENT_OTHER): Payer: 59 | Admitting: Internal Medicine

## 2023-09-02 DIAGNOSIS — R0683 Snoring: Secondary | ICD-10-CM

## 2023-09-02 NOTE — Progress Notes (Signed)
 Patient ID: Jenna [name], female   DOB: 10/24/1970, 53 y.o.   MRN: [id number] Virtual Visit via Video Note  I connected with Jenna [name] on 09/02/2023 at 11:32 AM by a video enabled telemedicine application and verified that I am speaking with the correct person using two identifiers.  Location: Patient: home Provider: Office   I discussed the limitations of evaluation and management by telemedicine and the availability of in person appointments. The patient expressed understanding and agreed to proceed.  History of Present Illness: Patient with history of morbid obesity on Wegovy  through [facility], preDM, OA of the knees, chronic lower back pain.  Depression (on Cymbalta  for LBP and mood), vitamin D  deficiency   Discussed the use of AI scribe software for clinical note transcription with the patient, who gave verbal consent to proceed.  History of Present Illness A 53 year old female presents for follow-up on weight management and sleep study arrangements.  She has not completed the sleep study due to a missed appointment for receiving instructions and has not received the home kit.  She switched from Ozempic  to Lomaira  (phentermine ) 8 mg by [facility] provider due to insurance coverage issues and has been on Lomaira  for about a month. This medication helps decrease her appetite, and she has lost approximately 7 to 8 pounds since starting it. She takes Lomaira  early in the morning and is working on not skipping meals, particularly breakfast. She has reduced sugary snacks and sodas and is increasing her water intake.  Her activity level includes walking, despite experiencing pain in her back and knees. She tries to park further away to increase her steps and uses a walking stick when the pain becomes severe.     Outpatient Encounter Medications as of 09/02/2023  Medication Sig   diclofenac  (VOLTAREN ) 75 MG EC tablet Take 1 tablet (75 mg total) by mouth 2 (two) times daily as  needed.   DULoxetine  (CYMBALTA ) 60 MG capsule Take 1 capsule (60 mg total) by mouth daily for back pain and mood.Must have office visit for refills   ferrous sulfate  325 (65 FE) MG tablet Take 325 mg by mouth every Wednesday.   furosemide  (LASIX ) 20 MG tablet Take 1 tablet (20 mg total) by mouth as needed for edema or fluid.   gabapentin  (NEURONTIN ) 300 MG capsule Take 1 capsule (300 mg total) by mouth daily as needed.   hydrOXYzine  (ATARAX ) 25 MG tablet Take 1-2 tablets (25-50 mg total) by mouth every 6 (six) to 8 (eight) hours as needed for itching.   ondansetron  (ZOFRAN ) 4 MG tablet Take 1 tablet (4 mg total) by mouth every 8 (eight) hours as needed for nausea or vomiting.   Phentermine  HCl (LOMAIRA ) 8 MG TABS Take 1 tablet (8 mg total) by mouth daily.   rOPINIRole  (REQUIP ) 0.5 MG tablet TAKE 1 TABLET BY MOUTH AT  BEDTIME   tiZANidine  (ZANAFLEX ) 4 MG tablet Take 1 tablet (4 mg total) by mouth every 8 (eight) hours as needed for muscle spasms.   topiramate  (TOPAMAX ) 25 MG tablet Take 3 tablets (75 mg total) by mouth at bedtime.   Vitamin D , Ergocalciferol , (DRISDOL ) 1.25 MG (50000 UNIT) CAPS capsule Take 1 capsule (50,000 Units total) by mouth every 7 (seven) days.   No facility-administered encounter medications on file as of 09/02/2023.   Last weight on the chart was 08/12/2023: 315 pounds with BMI of 52     Observations/Objective: Middle-age African-American female sitting in NAD  Assessment and Plan: 1.  Morbid obesity (HCC) (Primary) Patient currently on phentermine  and reports about 7 pound weight loss since being on it for 1 month.  She will continue the phentermine .  She is also on Topamax  (from neurology) which helps to augment the effect of the phentermine .  Encouraged her to continue healthy eating habits and trying to move as much as she can.  2. Loud snoring Patient to call [facility] about the home sleep study kit.   Follow Up Instructions: 4 mths Needs MWV next  mth   I discussed the assessment and treatment plan with the patient. The patient was provided an opportunity to ask questions and all were answered. The patient agreed with the plan and demonstrated an understanding of the instructions.   The patient was advised to call back or seek an in-person evaluation if the symptoms worsen or if the condition fails to improve as anticipated.  I spent 13 minutes dedicated to the care of this patient on the date of this encounter to include previsit review of of her chart including my last note, face-to-face time with patient discussing diagnosis and management and post visit entering of orders.  This note has been created with Education officer, environmental. Any transcriptional errors are unintentional.  Barnie Louder, MD

## 2023-09-03 ENCOUNTER — Ambulatory Visit
Admission: RE | Admit: 2023-09-03 | Discharge: 2023-09-03 | Discharge status: Home / Self Care | Source: Ambulatory Visit | Attending: Internal Medicine | Admitting: Internal Medicine

## 2023-09-03 ENCOUNTER — Telehealth: Payer: Self-pay | Admitting: Internal Medicine

## 2023-09-03 DIAGNOSIS — Z1231 Encounter for screening mammogram for malignant neoplasm of breast: Secondary | ICD-10-CM

## 2023-09-03 NOTE — Telephone Encounter (Signed)
-----   Message from Barnie Louder sent at 09/02/2023  1:46 PM EDT ----- Regarding: Appt Patient needs appointment scheduled for Medicare wellness visit with CMA or LPN after 2/77/7974.  Please give follow-up appointment with me for chronic disease management in 3 to 4 months.

## 2023-09-03 NOTE — Telephone Encounter (Signed)
 Called patient verified name and date of birth. Patient has been scheduled for a follow-up appointment 12/05/2023 at 3:10 pm, and has also been scheduled for a Medicare wellness phone visit 02/11/2024 at 2:50 pm. Patient acknowledged both appointments.

## 2023-09-05 ENCOUNTER — Ambulatory Visit: Payer: Self-pay | Admitting: Internal Medicine

## 2023-09-26 ENCOUNTER — Encounter (INDEPENDENT_AMBULATORY_CARE_PROVIDER_SITE_OTHER): Payer: Self-pay | Admitting: Internal Medicine

## 2023-09-26 ENCOUNTER — Ambulatory Visit (INDEPENDENT_AMBULATORY_CARE_PROVIDER_SITE_OTHER): Admitting: Internal Medicine

## 2023-09-26 VITALS — BP 134/82 | HR 74 | Temp 98.0°F | Ht 65.0 in | Wt 319.0 lb

## 2023-09-26 DIAGNOSIS — R638 Other symptoms and signs concerning food and fluid intake: Secondary | ICD-10-CM

## 2023-09-26 DIAGNOSIS — E66813 Obesity, class 3: Secondary | ICD-10-CM

## 2023-09-26 DIAGNOSIS — E88819 Insulin resistance, unspecified: Secondary | ICD-10-CM

## 2023-09-26 DIAGNOSIS — G4733 Obstructive sleep apnea (adult) (pediatric): Secondary | ICD-10-CM

## 2023-09-26 DIAGNOSIS — Z6841 Body Mass Index (BMI) 40.0 and over, adult: Secondary | ICD-10-CM | POA: Diagnosis not present

## 2023-09-26 MED ORDER — LOMAIRA 8 MG PO TABS: 8.0000 mg | ORAL_TABLET | Freq: Every day | ORAL | 0 refills | Status: DC

## 2023-09-26 NOTE — Assessment & Plan Note (Signed)
 Had a sleep study but never started CPAP.  She was opposed to have a repeat sleep study but no updates on this.  Follow-up with sleep specialist

## 2023-09-26 NOTE — Assessment & Plan Note (Signed)
 Her HOMA-IR is 10.78 which is markedly elevated. Optimal level < 1.9.   This is complex condition associated with genetics, ectopic fat and lifestyle factors. Insulin  resistance may result in increased fat storage, inhibition of the breakdown of fat, cause fluctuations in blood sugar leading to energy crashes and increased cravings for sugary or high carb foods and cause metabolic slowdown making it difficult to lose weight.  This may result in additional weight gain and lead to pre-diabetes and diabetes if untreated. In addition, hyperinsulinemia increases cardiovascular risk, chronic inflammatory response and may increase the risk of obesity related malignancies.   Lab Results  Component Value Date   HGBA1C 5.8 04/04/2023   Lab Results  Component Value Date   INSULIN  41.6 (H) 03/14/2022   INSULIN  16.1 07/28/2019   INSULIN  7.4 12/08/2018   Lab Results  Component Value Date   GLUCOSE 105 (H) 07/23/2022   GLUCOSE 83 12/03/2017    Patient is intolerant to metformin  and her insurance no longer covers Ozempic .  She will continue to work on reducing simple and added sugars in her diet and continue current weight management strategy

## 2023-09-26 NOTE — Assessment & Plan Note (Signed)
Patient has lost 3 pounds.

## 2023-09-26 NOTE — Assessment & Plan Note (Signed)
 Improved on  lomaira . She has increased orexigenic signaling, impaired satiety and inhibitory control. This is secondary to an abnormal energy regulation system and pathological neurohormonal pathways characteristic of excess adiposity.  In addition to nutritional and behavioral strategies she benefits from pharmacotherapy.  She no longer has GLP-1 coverage.  Continue Lomaira  at 8 mg once a day.  No adverse effects reported

## 2023-09-26 NOTE — Progress Notes (Signed)
 Office: [phone]  /  Fax: [phone]  Weight Summary and Body Composition Analysis (BIA)  Vitals Temp: 98 F (36.7 C) BP: 134/82 Pulse Rate: 74 SpO2: 97 %   Anthropometric Measurements Height: 5' 5 (1.651 m) Weight: (!) 319 lb (144.7 kg) BMI (Calculated): 53.08 Weight at Last Visit: 322 lb Weight Lost Since Last Visit: 3 lb Weight Gained Since Last Visit: 0 lb Starting Weight: 321 lb Total Weight Loss (lbs): 2 lb (0.907 kg) Peak Weight: 321 lb   Body Composition  Body Fat %: 53.3 % Fat Mass (lbs): 170 lbs Muscle Mass (lbs): 141.6 lbs Total Body Water (lbs): 109.2 lbs Visceral Fat Rating : 21    RMR: 2016  Today's Visit #: 19  Starting Date: 03/14/22   Subjective   Chief Complaint: Obesity  Interval History Discussed the use of AI scribe software for clinical note transcription with the patient, who gave verbal consent to proceed.  History of Present Illness   Jenna [name] is a 53 year old female who presents for follow-up at the [facility].  She reports following a 1500-calorie plan with fair adherence.  She reports exercising 5 days a week 30 minutes  She is currently on low dose phentermine  (Lomira) since June, which has significantly reduced her appetite and cravings, leading to a sense of fullness. However, she has not experienced the expected increase in energy levels.  She has undergone a significant lifestyle change, including increased physical activity due to moving to a new residence on the fifth floor.  She has experienced a major life change by dedicating her life to [place], which led her to move out from living with her partner due to religious beliefs. This change has allowed her to focus more on self-care and personal well-being.  She practices fasting from 6 AM to 6 PM, which she has been doing for about a week. Her current diet includes a lot of chicken, which she prepares by baking or using rotisserie  methods without added grease, and she removes the skin to reduce fat intake.       Challenges affecting patient progress: strong hunger signals and/or impaired satiety / inhibitory control.    Pharmacotherapy for weight management: She is currently taking Phentermine  (longterm use, single agent)  with adequate clinical response  and without side effects..   Assessment and Plan   Treatment Plan For Obesity:  Recommended Dietary Goals  Breonia is currently in the action stage of change. As such, her goal is to continue weight management plan. She has agreed to: follow the Category 3 plan - 1500 kcal per day, include a 16: 8 fasting schedule and incorporate 1 meal replacement per day  Behavioral Health and Counseling  We discussed the following behavioral modification strategies today: continue to work on maintaining a reduced calorie state, getting the recommended amount of protein, incorporating whole foods, making healthy choices, staying well hydrated and practicing mindfulness when eating..  Additional education and resources provided today: None  Recommended Physical Activity Goals  Arella has been advised to work up to 150 minutes of moderate intensity aerobic activity a week and strengthening exercises 2-3 times per week for cardiovascular health, weight loss maintenance and preservation of muscle mass.   She has agreed to :  continue to gradually increase the amount and intensity of exercise routine  Medical Interventions and Pharmacotherapy  We discussed various medication options to help Liah with her weight loss efforts and we both agreed to :  Adequate clinical response to anti-obesity medication, continue current regimen  Associated Conditions Impacted by Obesity Treatment  Assessment & Plan Class 3 severe obesity with serious comorbidity and body mass index (BMI) of 50.0 to 59.9 in adult, unspecified obesity type Patient has lost 3 pounds OSA (obstructive  sleep apnea) Had a sleep study but never started CPAP.  She was opposed to have a repeat sleep study but no updates on this.  Follow-up with sleep specialist  Insulin  resistance Her HOMA-IR is 10.78 which is markedly elevated. Optimal level < 1.9.   This is complex condition associated with genetics, ectopic fat and lifestyle factors. Insulin  resistance may result in increased fat storage, inhibition of the breakdown of fat, cause fluctuations in blood sugar leading to energy crashes and increased cravings for sugary or high carb foods and cause metabolic slowdown making it difficult to lose weight.  This may result in additional weight gain and lead to pre-diabetes and diabetes if untreated. In addition, hyperinsulinemia increases cardiovascular risk, chronic inflammatory response and may increase the risk of obesity related malignancies.   Lab Results  Component Value Date   HGBA1C 5.8 04/04/2023   Lab Results  Component Value Date   INSULIN  41.6 (H) 03/14/2022   INSULIN  16.1 07/28/2019   INSULIN  7.4 12/08/2018   Lab Results  Component Value Date   GLUCOSE 105 (H) 07/23/2022   GLUCOSE 83 12/03/2017    Patient is intolerant to metformin  and her insurance no longer covers Ozempic .  She will continue to work on reducing simple and added sugars in her diet and continue current weight management strategy  Abnormal food appetite Improved on  lomaira . She has increased orexigenic signaling, impaired satiety and inhibitory control. This is secondary to an abnormal energy regulation system and pathological neurohormonal pathways characteristic of excess adiposity.  In addition to nutritional and behavioral strategies she benefits from pharmacotherapy.  She no longer has GLP-1 coverage.  Continue Lomaira  at 8 mg once a day.  No adverse effects reported    General Health Maintenance Blood pressure and heart rate are well-managed, indicating good cardiovascular health. Encouraged to focus on  self-care and personal well-being following recent life changes. - Encourage self-care and personal well-being practices.        Objective   Physical Exam:  Blood pressure 134/82, pulse 74, temperature 98 F (36.7 C), height 5' 5 (1.651 m), weight (!) 319 lb (144.7 kg), SpO2 97%. Body mass index is 53.08 kg/m.  General: She is overweight, cooperative, alert, well developed, and in no acute distress. PSYCH: Has normal mood, affect and thought process.   HEENT: EOMI, sclerae are anicteric. Lungs: Normal breathing effort, no conversational dyspnea. Extremities: No edema.  Neurologic: No gross sensory or motor deficits. No tremors or fasciculations noted.    Diagnostic Data Reviewed:  BMET    Component Value Date/Time   NA 140 07/23/2022 1213   K 4.7 07/23/2022 1213   CL 108 (H) 07/23/2022 1213   CO2 19 (L) 07/23/2022 1213   GLUCOSE 105 (H) 07/23/2022 1213   GLUCOSE 118 (H) 12/20/2021 1430   BUN 8 07/23/2022 1213   CREATININE 0.89 07/23/2022 1213   CALCIUM 9.0 07/23/2022 1213   GFRNONAA 55 (L) 12/20/2021 1430   GFRAA >60 09/25/2019 1548   Lab Results  Component Value Date   HGBA1C 5.8 04/04/2023   HGBA1C 5.6 12/03/2017   Lab Results  Component Value Date   INSULIN  41.6 (H) 03/14/2022   INSULIN  7.4 12/08/2018   Lab  Results  Component Value Date   TSH 1.655 12/20/2021   CBC    Component Value Date/Time   WBC 8.1 12/20/2021 1430   RBC 4.57 12/20/2021 1430   HGB 12.5 12/20/2021 1430   HGB 12.5 01/23/2021 1525   HCT 38.9 12/20/2021 1430   HCT 37.4 01/23/2021 1525   PLT 333 12/20/2021 1430   PLT 284 01/23/2021 1525   MCV 85.1 12/20/2021 1430   MCV 88 01/23/2021 1525   MCH 27.4 12/20/2021 1430   MCHC 32.1 12/20/2021 1430   RDW 14.6 12/20/2021 1430   RDW 15.1 01/23/2021 1525   Iron Studies    Component Value Date/Time   IRON 53 04/02/2019 1510   TIBC 360 04/02/2019 1510   FERRITIN 26 04/02/2019 1510   IRONPCTSAT 15 04/02/2019 1510   Lipid Panel      Component Value Date/Time   CHOL 140 03/14/2022 1030   TRIG 68 03/14/2022 1030   HDL 46 03/14/2022 1030   CHOLHDL 3.8 10/06/2020 1458   LDLCALC 80 03/14/2022 1030   Hepatic Function Panel     Component Value Date/Time   PROT 6.8 07/23/2022 1213   ALBUMIN 3.9 07/23/2022 1213   AST 24 07/23/2022 1213   ALT 28 07/23/2022 1213   ALKPHOS 81 07/23/2022 1213   BILITOT 0.4 07/23/2022 1213      Component Value Date/Time   TSH 1.655 12/20/2021 1430   TSH 1.430 01/23/2021 1525   Nutritional Lab Results  Component Value Date   VD25OH 28.3 (L) 07/23/2022   VD25OH 14.6 (L) 03/14/2022   VD25OH 14.8 (L) 01/23/2021    Medications: Outpatient Encounter Medications as of 09/26/2023  Medication Sig   diclofenac  (VOLTAREN ) 75 MG EC tablet Take 1 tablet (75 mg total) by mouth 2 (two) times daily as needed.   DULoxetine  (CYMBALTA ) 60 MG capsule Take 1 capsule (60 mg total) by mouth daily for back pain and mood.Must have office visit for refills   ferrous sulfate  325 (65 FE) MG tablet Take 325 mg by mouth every Wednesday.   furosemide  (LASIX ) 20 MG tablet Take 1 tablet (20 mg total) by mouth as needed for edema or fluid.   gabapentin  (NEURONTIN ) 300 MG capsule Take 1 capsule (300 mg total) by mouth daily as needed.   hydrOXYzine  (ATARAX ) 25 MG tablet Take 1-2 tablets (25-50 mg total) by mouth every 6 (six) to 8 (eight) hours as needed for itching.   ondansetron  (ZOFRAN ) 4 MG tablet Take 1 tablet (4 mg total) by mouth every 8 (eight) hours as needed for nausea or vomiting.   rOPINIRole  (REQUIP ) 0.5 MG tablet TAKE 1 TABLET BY MOUTH AT  BEDTIME   tiZANidine  (ZANAFLEX ) 4 MG tablet Take 1 tablet (4 mg total) by mouth every 8 (eight) hours as needed for muscle spasms.   topiramate  (TOPAMAX ) 25 MG tablet Take 3 tablets (75 mg total) by mouth at bedtime.   Vitamin D , Ergocalciferol , (DRISDOL ) 1.25 MG (50000 UNIT) CAPS capsule Take 1 capsule (50,000 Units total) by mouth every 7 (seven) days.    [DISCONTINUED] Phentermine  HCl (LOMAIRA ) 8 MG TABS Take 1 tablet (8 mg total) by mouth daily.   Phentermine  HCl (LOMAIRA ) 8 MG TABS Take 1 tablet (8 mg total) by mouth daily.   No facility-administered encounter medications on file as of 09/26/2023.     Follow-Up   Return in about 4 weeks (around 10/24/2023) for For Weight Mangement with Dr. Francyne.[email or web address] She was informed of the importance of frequent follow up visits  to maximize her success with intensive lifestyle modifications for her multiple health conditions.  Attestation Statement   Reviewed by clinician on day of visit: allergies, medications, problem list, medical history, surgical history, family history, social history, and previous encounter notes.     Lucas Parker, MD

## 2023-10-29 ENCOUNTER — Encounter (INDEPENDENT_AMBULATORY_CARE_PROVIDER_SITE_OTHER): Payer: Self-pay | Admitting: Internal Medicine

## 2023-10-29 ENCOUNTER — Telehealth (INDEPENDENT_AMBULATORY_CARE_PROVIDER_SITE_OTHER): Admitting: Internal Medicine

## 2023-10-29 VITALS — BP 132/62 | HR 92 | Ht 65.0 in | Wt 319.0 lb

## 2023-10-29 DIAGNOSIS — G4733 Obstructive sleep apnea (adult) (pediatric): Secondary | ICD-10-CM

## 2023-10-29 DIAGNOSIS — R638 Other symptoms and signs concerning food and fluid intake: Secondary | ICD-10-CM | POA: Diagnosis not present

## 2023-10-29 DIAGNOSIS — Z6841 Body Mass Index (BMI) 40.0 and over, adult: Secondary | ICD-10-CM

## 2023-10-29 DIAGNOSIS — E66813 Obesity, class 3: Secondary | ICD-10-CM

## 2023-10-29 DIAGNOSIS — R7303 Prediabetes: Secondary | ICD-10-CM

## 2023-10-29 MED ORDER — PHENTERMINE HCL 37.5 MG PO TABS: 18.7500 mg | ORAL_TABLET | Freq: Every day | ORAL | 0 refills | Status: DC

## 2023-10-29 NOTE — Progress Notes (Signed)
 Virtual Visit via Video Note  I connected withNAME@ on 10/29/23 at  3:20 PM EDT by a video enabled telemedicine application and verified that I am speaking with the correct person using two identifiers.  Location:  Patient: Home Provider: Office   I discussed the limitations of evaluation and management by telemedicine and the availability of in person appointments. The patient expressed understanding and agreed to proceed.    Weight Summary And Biometrics  Vitals BP: 132/62 (reported by pt during TH) Pulse Rate: 92 (reported by pt during TH)   Anthropometric Measurements Height: 5' 5 (1.651 m) Weight: (!) 319 lb (144.7 kg) BMI (Calculated): 53.08 Weight at Last Visit: 319 lb Starting Weight: 321 lb Peak Weight: 321 lb   No data recorded  RMR: 2016  Today's Visit #: 20  Starting Date: 03/14/22   Subjective   Chief Complaint: Obesity  Jenna [name] is here to discuss her progress with her obesity treatment plan. Jenna [name] is on the the Category 3 Plan and states Jenna [name] is following her eating plan approximately 80 % of the time. Jenna [name] states Jenna [name] is exercising 10-20 minutes 5 times per week.  Weight Progress Since Last Visit:  Since last office visit Jenna [name] has maintained weight. Jenna [name] reports good adherence to reduced calorie nutritional plan. Jenna [name] has been working on reading food labels, not skipping meals, increasing protein intake at every meal, drinking more water, making healthier choices, reducing portion sizes, and incorporating more whole foods   Patient Reported Barriers to Progress: low volume of physical activity at present .   Orexigenic Control: Reports problems with appetite and hunger signals.  Reports problems with satiety and satiation.  Denies problems with eating patterns and portion control.  Denies abnormal cravings. Denies feeling deprived or restricted.   Pharmacotherapy for weight management: Jenna [name] is currently taking Topiramate  (off label use, single  agent) with adequate clinical response  and without side effects. and Phentermine  (longterm use, off-label, single agent)  with adequate clinical response  and without side effects..   Assessment and Plan   Treatment Plan For Obesity:  Recommended Dietary Goals  Jenna [name] is currently in the action stage of change. As such, her goal is to continue weight management plan. Jenna [name] has agreed to: continue current plan.  Advised to add a meal replacement in the morning and avoid skipping meals  Behavioral Health and Counseling  We discussed the following behavioral modification strategies today: continue to work on maintaining a reduced calorie state, getting the recommended amount of protein, incorporating whole foods, making healthy choices, staying well hydrated and practicing mindfulness when eating..  Additional education and resources provided today: None  Recommended Physical Activity Goals  Jenna [name] has been advised to work up to 150 minutes of moderate intensity aerobic activity a week and strengthening exercises 2-3 times per week for cardiovascular health, weight loss maintenance and preservation of muscle mass.   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  Jenna [name] has been working on increasing  volume of physical activity but feels somewhat limited due to orthopedic problems and poor endurance.  Jenna [name] is currently on 1500-calorie nutrition plan with fair adherence.   24-hour recall consistent with dietary principles but Jenna [name] is only doing 2 meals a day.  We discussed possibly doing a protein bar in the morning.  Will also increasing phentermine  to 18.75 mg once a day.  If Jenna [name] does not have 5% or more at maximum tolerated dose medication will be discontinued. Prediabetes Her last A1c was 5.8 and improved from previous 6.3 but patient is at risk for progression.  Jenna [name] had been on metformin  in the past but medication was discontinued because of increase in bicarbonate while also taking topiramate .  Her insurance no longer covers GLP-1.  Continue with nutritional and behavioral strategies Abnormal food appetite Improved on  lomaira  but having some breakthrough hunger. Jenna [name] has increased orexigenic signaling, impaired satiety and inhibitory control. This is secondary to an abnormal energy regulation system and pathological neurohormonal pathways characteristic of excess adiposity.  In addition to nutritional and behavioral strategies Jenna [name] benefits from pharmacotherapy.  Jenna [name] no longer has GLP-1 coverage.  Switch from Lomaira  to Adipex 18.75 mg once a day     Objective   Physical Exam:  Blood pressure 132/62, pulse 92, height 5' 5 (1.651 m), weight (!) 319 lb (144.7 kg). Body mass index is 53.08 kg/m.  General: Jenna [name] is overweight, cooperative, alert, well developed, and in no acute distress. PSYCH: Has normal mood, affect and thought process.   Lungs: Normal breathing effort, no conversational dyspnea.    Diagnostic Data Reviewed:  BMET    Component Value Date/Time   NA 140 07/23/2022 1213   K 4.7 07/23/2022 1213   CL 108 (H) 07/23/2022 1213   CO2 19 (L) 07/23/2022 1213   GLUCOSE 105 (H) 07/23/2022 1213   GLUCOSE 118 (H) 12/20/2021 1430   BUN 8 07/23/2022 1213   CREATININE 0.89 07/23/2022 1213   CALCIUM 9.0 07/23/2022 1213   GFRNONAA 55 (L) 12/20/2021 1430   GFRAA >60 09/25/2019 1548   Lab Results  Component Value Date   HGBA1C 5.8 04/04/2023    HGBA1C 5.6 12/03/2017   Lab Results  Component Value Date   INSULIN  41.6 (H) 03/14/2022   INSULIN  7.4 12/08/2018   Lab Results  Component Value Date   TSH 1.655 12/20/2021   CBC    Component Value Date/Time   WBC 8.1 12/20/2021 1430   RBC 4.57 12/20/2021 1430   HGB 12.5 12/20/2021 1430   HGB 12.5 01/23/2021 1525   HCT 38.9 12/20/2021 1430   HCT 37.4 01/23/2021 1525   PLT 333 12/20/2021 1430   PLT 284 01/23/2021 1525   MCV 85.1 12/20/2021 1430   MCV 88 01/23/2021 1525   MCH 27.4 12/20/2021 1430   MCHC 32.1 12/20/2021 1430   RDW 14.6 12/20/2021 1430   RDW 15.1 01/23/2021 1525   Iron Studies    Component Value Date/Time   IRON 53 04/02/2019 1510   TIBC 360 04/02/2019 1510   FERRITIN 26 04/02/2019 1510   IRONPCTSAT 15 04/02/2019 1510   Lipid Panel     Component Value Date/Time   CHOL 140 03/14/2022 1030   TRIG 68 03/14/2022 1030   HDL 46 03/14/2022 1030   CHOLHDL 3.8 10/06/2020 1458   LDLCALC 80 03/14/2022 1030   Hepatic Function Panel     Component Value Date/Time   PROT 6.8 07/23/2022 1213   ALBUMIN 3.9 07/23/2022 1213   AST 24  07/23/2022 1213   ALT 28 07/23/2022 1213   ALKPHOS 81 07/23/2022 1213   BILITOT 0.4 07/23/2022 1213      Component Value Date/Time   TSH 1.655 12/20/2021 1430   TSH 1.430 01/23/2021 1525   Nutritional Lab Results  Component Value Date   VD25OH 28.3 (L) 07/23/2022   VD25OH 14.6 (L) 03/14/2022   VD25OH 14.8 (L) 01/23/2021    Follow-Up   No follow-ups on file.Jenna [name] Jenna [name] was informed of the importance of frequent follow up visits to maximize her success with intensive lifestyle modifications for her multiple health conditions.  Attestation Statement   Reviewed by clinician on day of visit: allergies, medications, problem list, medical history, surgical history, family history, social history, and previous encounter notes.   I discussed the assessment and treatment plan with the patient. The patient was provided an opportunity  to ask questions and all were answered. The patient agreed with the plan and demonstrated an understanding of the instructions.   The patient was advised to call back or seek an in-person evaluation if the symptoms worsen or if the condition fails to improve as anticipated.   I have spent 21 minutes in the care of the patient today including: 3 minutes before the visit reviewing and preparing the chart. 14 minutes face-to-face assessing and reviewing listed medical problems as outlined in obesity care plan, providing nutritional and behavioral counseling on topics outlined in the obesity care plan, counseling regarding anti-obesity medication as outlined in obesity care plan, and ordering medications - see orders 4 minutes after the visit updating chart and documentation of encounter.    Lucas Parker, MD

## 2023-10-29 NOTE — Assessment & Plan Note (Signed)
 Patient with diet resistant obesity.  She has been working on increasing volume of physical activity but feels somewhat limited due to orthopedic problems and poor endurance.  She is currently on 1500-calorie nutrition plan with fair adherence.  24-hour recall consistent with dietary principles but she is only doing 2 meals a day.  We discussed possibly doing a protein bar in the morning.  Will also increasing phentermine  to 18.75 mg once a day.  If she does not have 5% or more at maximum tolerated dose medication will be discontinued.

## 2023-10-29 NOTE — Assessment & Plan Note (Signed)
 Improved on  lomaira  but having some breakthrough hunger. She has increased orexigenic signaling, impaired satiety and inhibitory control. This is secondary to an abnormal energy regulation system and pathological neurohormonal pathways characteristic of excess adiposity.  In addition to nutritional and behavioral strategies she benefits from pharmacotherapy.  She no longer has GLP-1 coverage.  Switch from Lomaira  to Adipex 18.75 mg once a day

## 2023-10-29 NOTE — Assessment & Plan Note (Signed)
 Her last A1c was 5.8 and improved from previous 6.3 but patient is at risk for progression.  She had been on metformin  in the past but medication was discontinued because of increase in bicarbonate while also taking topiramate .  Her insurance no longer covers GLP-1.  Continue with nutritional and behavioral strategies

## 2023-11-07 ENCOUNTER — Encounter: Payer: Self-pay | Admitting: Internal Medicine

## 2023-11-12 ENCOUNTER — Ambulatory Visit: Attending: Internal Medicine | Admitting: Internal Medicine

## 2023-11-12 DIAGNOSIS — G8929 Other chronic pain: Secondary | ICD-10-CM | POA: Diagnosis not present

## 2023-11-12 DIAGNOSIS — M170 Bilateral primary osteoarthritis of knee: Secondary | ICD-10-CM | POA: Diagnosis not present

## 2023-11-12 DIAGNOSIS — M545 Low back pain, unspecified: Secondary | ICD-10-CM | POA: Diagnosis not present

## 2023-11-12 NOTE — Progress Notes (Signed)
 Patient ID: Jenna [name], female   DOB: 1970/03/23, 53 y.o.   MRN: [id number] Virtual Visit via Video Note  I connected with Jenna [name] on 11/12/2023 at 8:16 AM by a video enabled telemedicine application and verified that I am speaking with the correct person using two identifiers.  Location: Patient: home Provider: Office   I discussed the limitations of evaluation and management by telemedicine and the availability of in person appointments. The patient expressed understanding and agreed to proceed.  History of Present Illness: Patient with history of morbid obesity on Wegov[place]  through [facility], preDM, OA of the knees, chronic lower back pain.  Depression (on Cymbalta  for LBP and mood), vitamin D  deficiency   Patient presents today requesting rollator walker with seat.  She has osteoarthritis in both knees with the left being greater than the right.  She is needing to have total knee replacement per orthopedics.  However she needs to get her weight down before this can be pursued.  Currently plugged in with medical weight management. She feels that her knees have gotten worse making it more difficult to walk even short distances.  She lives on the fifth floor of her apartment building and parks right in front of the building.  Just walking from the lobby to her car is difficult due to knee pain and chronic lower back pain.     Outpatient Encounter Medications as of 11/12/2023  Medication Sig   diclofenac  (VOLTAREN ) 75 MG EC tablet Take 1 tablet (75 mg total) by mouth 2 (two) times daily as needed.   DULoxetine  (CYMBALTA ) 60 MG capsule Take 1 capsule (60 mg total) by mouth daily for back pain and mood.Must have office visit for refills   ferrous sulfate  325 (65 FE) MG tablet Take 325 mg by mouth every Wednesday.   furosemide  (LASIX ) 20 MG tablet Take 1 tablet (20 mg total) by mouth as needed for edema or fluid. (Patient not taking: Reported on 10/29/2023)   gabapentin  (NEURONTIN ) 300 MG capsule  Take 1 capsule (300 mg total) by mouth daily as needed.   hydrOXYzine  (ATARAX ) 25 MG tablet Take 1-2 tablets (25-50 mg total) by mouth every 6 (six) to 8 (eight) hours as needed for itching.   ondansetron  (ZOFRAN ) 4 MG tablet Take 1 tablet (4 mg total) by mouth every 8 (eight) hours as needed for nausea or vomiting.   phentermine  (ADIPEX-P ) 37.5 MG tablet Take 0.5 tablets (18.75 mg total) by mouth daily before breakfast.   rOPINIRole  (REQUIP ) 0.5 MG tablet TAKE 1 TABLET BY MOUTH AT  BEDTIME   tiZANidine  (ZANAFLEX ) 4 MG tablet Take 1 tablet (4 mg total) by mouth every 8 (eight) hours as needed for muscle spasms.   topiramate  (TOPAMAX ) 25 MG tablet Take 3 tablets (75 mg total) by mouth at bedtime.   Vitamin D , Ergocalciferol , (DRISDOL ) 1.25 MG (50000 UNIT) CAPS capsule Take 1 capsule (50,000 Units total) by mouth every 7 (seven) days.   No facility-administered encounter medications on file as of 11/12/2023.      Observations/Objective: Obese, middle age AAF in NAD Last wgh/BMI in system: 319 lbs/53 on 09/26/2023  Assessment and Plan: 1. Primary osteoarthritis of both knees (Primary) Patient with severe osteoarthritis of the knees with the left being greater than the right.  She has difficulty walking even short distances due to arthritis pain and chronic lower back pain.  She would benefit from having a rollator walker with seat so that she can stop intermittently and sit when needed. -  For home use only DME 4 wheeled rolling walker with seat ([id number])  2. Chronic low back pain, unspecified back pain laterality, unspecified whether sciatica present See #1. - For home use only DME 4 wheeled rolling walker with seat ([id number])  3. Morbid obesity (HCC) Currently plugged in with medical weight management. - For home use only DME 4 wheeled rolling walker with seat ([id number])   Follow Up Instructions: As previously scheduled.   I discussed the assessment and treatment plan with the patient.  The patient was provided an opportunity to ask questions and all were answered. The patient agreed with the plan and demonstrated an understanding of the instructions.   The patient was advised to call back or seek an in-person evaluation if the symptoms worsen or if the condition fails to improve as anticipated.  I spent 7 minutes dedicated to the care of this patient on the date of this encounter to include previsit review of of chart, face-to-face time with patient discussing diagnosis and management and post visit entering of orders.  This note has been created with Education officer, environmental. Any transcriptional errors are unintentional.  Barnie Louder, MD

## 2023-11-27 ENCOUNTER — Encounter (INDEPENDENT_AMBULATORY_CARE_PROVIDER_SITE_OTHER): Payer: Self-pay | Admitting: Internal Medicine

## 2023-11-27 ENCOUNTER — Ambulatory Visit (INDEPENDENT_AMBULATORY_CARE_PROVIDER_SITE_OTHER): Admitting: Internal Medicine

## 2023-11-27 DIAGNOSIS — E66813 Obesity, class 3: Secondary | ICD-10-CM | POA: Diagnosis not present

## 2023-11-27 DIAGNOSIS — R7303 Prediabetes: Secondary | ICD-10-CM | POA: Diagnosis not present

## 2023-11-27 DIAGNOSIS — R638 Other symptoms and signs concerning food and fluid intake: Secondary | ICD-10-CM | POA: Diagnosis not present

## 2023-11-27 DIAGNOSIS — G4733 Obstructive sleep apnea (adult) (pediatric): Secondary | ICD-10-CM | POA: Diagnosis not present

## 2023-11-27 DIAGNOSIS — Z6841 Body Mass Index (BMI) 40.0 and over, adult: Secondary | ICD-10-CM | POA: Diagnosis not present

## 2023-11-27 MED ORDER — PHENTERMINE HCL 37.5 MG PO TABS: 37.5000 mg | ORAL_TABLET | Freq: Every day | ORAL | 0 refills | Status: DC

## 2023-11-27 NOTE — Assessment & Plan Note (Signed)
 Improved on phentermine . She has increased orexigenic signaling, impaired satiety and inhibitory control. This is secondary to an abnormal energy regulation system and pathological neurohormonal pathways characteristic of excess adiposity.  In addition to nutritional and behavioral strategies she benefits from pharmacotherapy.  She no longer has GLP-1 coverage.  Increase phentermine  to full tablet 37.5 mg

## 2023-11-27 NOTE — Progress Notes (Signed)
 Office: [phone]  /  Fax: [phone]  Weight Summary and Body Composition Analysis (BIA)  Vitals Temp: 98 F (36.7 C) BP: 132/86 Pulse Rate: 72 SpO2: 98 %   Anthropometric Measurements Height: 5' 5 (1.651 m) Weight: (!) 322 lb (146.1 kg) BMI (Calculated): 53.58 Weight at Last Visit: 319 lb Weight Lost Since Last Visit: 0 lb Weight Gained Since Last Visit: 3 lb Starting Weight: 321 lb Total Weight Loss (lbs): 0 lb (0 kg) Peak Weight: 321 lb   Body Composition  Body Fat %: 53.9 % Fat Mass (lbs): 173.8 lbs Muscle Mass (lbs): 141.2 lbs Total Body Water (lbs): 109.4 lbs Visceral Fat Rating : 21    RMR: 2016  Today's Visit #: 21  Starting Date: 03/14/22   Subjective   Chief Complaint: Obesity  Interval History Discussed the use of AI scribe software for clinical note transcription with the patient, who gave verbal consent to proceed.  History of Present Illness Jenna [name] is a 53 year old female with obesity, sleep apnea, and insulin  resistance who presents for medical weight management.  She has been in our program for over a year and after a brief period of weight loss she has just been maintaining.  She has experienced progressive weight gain over the past few years, with her weight increasing from 262 pounds in 2020 to 319 pounds by 2023. She attributes this weight gain to decreased physical activity and poor nutrition during the COVID-19 pandemic.   Her medical history includes sleep apnea and insulin  resistance with prediabetes. Her weight management efforts have been inconsistent, with periods of weight loss followed by regain. She reports fluctuating motivation but now feels 'tired' and ready to make a change.  Her current medication regimen includes phentermine , which she initially took in half doses but has recently started taking the full tablet. She also takes Topamax . Limited insurance coverage for anti-obesity medications impacts her  treatment options.  She did not like how she felt on GLP-1.  She has recently started a new exercise routine with guidance from a friend who is aerobic certified and in the army.  She is journaling her food intake and using apps to track her progress.  Her social history includes a recent life change where she has removed obstacles and distractions, allowing her to focus more on her health. She is motivated to restart her weight management journey and is committed to adhering to her new exercise and nutrition plan.     Challenges affecting patient progress: low volume of physical activity at present , medical comorbidities, presence of obesogenic drugs, and all-or- none mindset.    Pharmacotherapy for weight management: She is currently taking Phentermine  (longterm use, off-label, single agent)  with adequate clinical response  and without side effects..   Assessment and Plan   Treatment Plan For Obesity:  Recommended Dietary Goals  Jenna [name] is currently in the action stage of change. As such, her goal is to continue weight management plan. She has agreed to: follow tailored, multi-day,whole foods, low-carbohydrate, high protein plan targeting 1500 calories and 120 grams of protein per day  Behavioral Health and Counseling  We discussed the following behavioral modification strategies today: work on tracking and journaling calories using tracking application, keeping healthy foods at home, decreasing eating out or consumption of processed foods, and making healthy choices when eating convenient foods, continue to work on implementation of reduced calorie nutritional plan, getting back on track after recent relapse, and avoid all or none thinking.  Additional education and resources provided today: Provided with personal guidance and instructions on how to use [employer] for healthy meal ideas and cooking in bulk.  Recommended Physical Activity Goals  Jenna [name] has been advised to  work up to 150 minutes of moderate intensity aerobic activity a week and strengthening exercises 2-3 times per week for cardiovascular health, weight loss maintenance and preservation of muscle mass.  She has agreed to :  Think about enjoyable ways to increase daily physical activity and overcoming barriers to exercise, Increase physical activity in their day and reduce sedentary time (increase NEAT)., Increase volume of physical activity to a goal of 240 minutes a week, and Combine aerobic and strengthening exercises for efficiency and improved cardiometabolic health.  Medical Interventions and Pharmacotherapy  We discussed various medication options to help Jenna [name] with her weight loss efforts and we both agreed to : Increase phentermine  to 37.5 plan  Associated Conditions Impacted by Obesity Treatment  Assessment & Plan Class 3 severe obesity with serious comorbidity and body mass index (BMI) of 50.0 to 59.9 in adult, unspecified obesity type She has experienced progressive weight gain with a current BMI of 53. Initially lost weight after starting phentermine  in April but has since regained weight, with a recent increase of one pound since the last visit. Contributing factors include limited coverage for anti-obesity medications, inconsistent adherence to lifestyle changes, and decreased physical activity and increased food intake due to the COVID-19 pandemic. She has expressed renewed commitment to weight management, motivated by personal reasons and support from a friend assisting with exercise and nutrition. Currently taking phentermine , initially at half a tablet, now increased to a full tablet. Emphasized the importance of consistent lifestyle changes, including diet and exercise, as a foundation for successful weight management. Discussed potential need for medications and possibly surgery in the future, highlighting the importance of a solid foundation in lifestyle changes before considering  surgical options.  - Increase phentermine  to one tablet daily and send prescription to [facility] in [facility].  If no weight loss at the higher dose medication will be discontinued.  She is taking topiramate  for other medical reasons. - Provide a new nutritional guide targeting specific calorie intake. - Encourage continuation of the exercise routine provided by her friend. - Advise a low carb, high protein meal plan, including protein shakes or smoothies as meal replacements. - Encourage journaling of food intake and exercise routine to maintain consistency. - Schedule follow-up appointment in one month to assess progress, with a goal of losing five pounds. OSA (obstructive sleep apnea) Untreated she has been educated on the bidirectional relationship of sleep apnea and weight.  Prediabetes Her last A1c was 5.8 and improved from previous 6.3 but patient is at risk for progression.  She had been on metformin  in the past but medication was discontinued because of increase in bicarbonate while also taking topiramate .  Her insurance no longer covers GLP-1.  Continue with nutritional and behavioral strategies Abnormal food appetite Improved on phentermine . She has increased orexigenic signaling, impaired satiety and inhibitory control. This is secondary to an abnormal energy regulation system and pathological neurohormonal pathways characteristic of excess adiposity.  In addition to nutritional and behavioral strategies she benefits from pharmacotherapy.  She no longer has GLP-1 coverage.  Increase phentermine  to full tablet 37.5 mg          Objective   Physical Exam:  Blood pressure 132/86, pulse 72, temperature 98 F (36.7 C), height 5' 5 (1.651 m), weight (!) 322  lb (146.1 kg), SpO2 98%. Body mass index is 53.58 kg/m.  General: She is overweight, cooperative, alert, well developed, and in no acute distress. PSYCH: Has normal mood, affect and thought process.   HEENT: EOMI,  sclerae are anicteric. Lungs: Normal breathing effort, no conversational dyspnea. Extremities: No edema.  Neurologic: No gross sensory or motor deficits. No tremors or fasciculations noted.    Diagnostic Data Reviewed: Possible  BMET    Component Value Date/Time   NA 140 07/23/2022 1213   K 4.7 07/23/2022 1213   CL 108 (H) 07/23/2022 1213   CO2 19 (L) 07/23/2022 1213   GLUCOSE 105 (H) 07/23/2022 1213   GLUCOSE 118 (H) 12/20/2021 1430   BUN 8 07/23/2022 1213   CREATININE 0.89 07/23/2022 1213   CALCIUM 9.0 07/23/2022 1213   GFRNONAA 55 (L) 12/20/2021 1430   GFRAA >60 09/25/2019 1548   Lab Results  Component Value Date   HGBA1C 5.8 04/04/2023   HGBA1C 5.6 12/03/2017   Lab Results  Component Value Date   INSULIN  41.6 (H) 03/14/2022   INSULIN  7.4 12/08/2018   Lab Results  Component Value Date   TSH 1.655 12/20/2021   CBC    Component Value Date/Time   WBC 8.1 12/20/2021 1430   RBC 4.57 12/20/2021 1430   HGB 12.5 12/20/2021 1430   HGB 12.5 01/23/2021 1525   HCT 38.9 12/20/2021 1430   HCT 37.4 01/23/2021 1525   PLT 333 12/20/2021 1430   PLT 284 01/23/2021 1525   MCV 85.1 12/20/2021 1430   MCV 88 01/23/2021 1525   MCH 27.4 12/20/2021 1430   MCHC 32.1 12/20/2021 1430   RDW 14.6 12/20/2021 1430   RDW 15.1 01/23/2021 1525   Iron Studies    Component Value Date/Time   IRON 53 04/02/2019 1510   TIBC 360 04/02/2019 1510   FERRITIN 26 04/02/2019 1510   IRONPCTSAT 15 04/02/2019 1510   Lipid Panel     Component Value Date/Time   CHOL 140 03/14/2022 1030   TRIG 68 03/14/2022 1030   HDL 46 03/14/2022 1030   CHOLHDL 3.8 10/06/2020 1458   LDLCALC 80 03/14/2022 1030   Hepatic Function Panel     Component Value Date/Time   PROT 6.8 07/23/2022 1213   ALBUMIN 3.9 07/23/2022 1213   AST 24 07/23/2022 1213   ALT 28 07/23/2022 1213   ALKPHOS 81 07/23/2022 1213   BILITOT 0.4 07/23/2022 1213      Component Value Date/Time   TSH 1.655 12/20/2021 1430   TSH 1.430  01/23/2021 1525   Nutritional Lab Results  Component Value Date   VD25OH 28.3 (L) 07/23/2022   VD25OH 14.6 (L) 03/14/2022   VD25OH 14.8 (L) 01/23/2021    Medications: Outpatient Encounter Medications as of 11/27/2023  Medication Sig   diclofenac  (VOLTAREN ) 75 MG EC tablet Take 1 tablet (75 mg total) by mouth 2 (two) times daily as needed.   DULoxetine  (CYMBALTA ) 60 MG capsule Take 1 capsule (60 mg total) by mouth daily for back pain and mood.Must have office visit for refills   ferrous sulfate  325 (65 FE) MG tablet Take 325 mg by mouth every Wednesday.   gabapentin  (NEURONTIN ) 300 MG capsule Take 1 capsule (300 mg total) by mouth daily as needed.   hydrOXYzine  (ATARAX ) 25 MG tablet Take 1-2 tablets (25-50 mg total) by mouth every 6 (six) to 8 (eight) hours as needed for itching.   ondansetron  (ZOFRAN ) 4 MG tablet Take 1 tablet (4 mg total) by mouth  every 8 (eight) hours as needed for nausea or vomiting.   rOPINIRole  (REQUIP ) 0.5 MG tablet TAKE 1 TABLET BY MOUTH AT  BEDTIME   tiZANidine  (ZANAFLEX ) 4 MG tablet Take 1 tablet (4 mg total) by mouth every 8 (eight) hours as needed for muscle spasms.   topiramate  (TOPAMAX ) 25 MG tablet Take 3 tablets (75 mg total) by mouth at bedtime.   Vitamin D , Ergocalciferol , (DRISDOL ) 1.25 MG (50000 UNIT) CAPS capsule Take 1 capsule (50,000 Units total) by mouth every 7 (seven) days.   [DISCONTINUED] phentermine  (ADIPEX-P ) 37.5 MG tablet Take 0.5 tablets (18.75 mg total) by mouth daily before breakfast.   furosemide  (LASIX ) 20 MG tablet Take 1 tablet (20 mg total) by mouth as needed for edema or fluid. (Patient not taking: Reported on 11/27/2023)   phentermine  (ADIPEX-P ) 37.5 MG tablet Take 1 tablet (37.5 mg total) by mouth daily before breakfast.   No facility-administered encounter medications on file as of 11/27/2023.     Follow-Up   Return in about 4 weeks (around 12/25/2023) for For Weight Mangement with Dr. Francyne.SABRA She was informed of the  importance of frequent follow up visits to maximize her success with intensive lifestyle modifications for her multiple health conditions.  Attestation Statement   Reviewed by clinician on day of visit: allergies, medications, problem list, medical history, surgical history, family history, social history, and previous encounter notes.   I have spent 45 minutes in the care of the patient today including: 2 minutes before the visit reviewing and preparing the chart. 36 minutes face-to-face assessing and reviewing listed medical problems as outlined in obesity care plan, providing nutritional and behavioral counseling on topics outlined in the obesity care plan, counseling regarding anti-obesity medication as outlined in obesity care plan, independently interpreting test results and goals of care, as described in assessment and plan, reviewing and discussing biometric information and progress, and ordering medications - see orders 7 minutes after the visit updating chart and documentation of encounter.    Lucas Francyne, MD

## 2023-11-27 NOTE — Assessment & Plan Note (Signed)
 Untreated she has been educated on the bidirectional relationship of sleep apnea and weight.

## 2023-11-27 NOTE — Assessment & Plan Note (Signed)
 She has experienced progressive weight gain with a current BMI of 53. Initially lost weight after starting phentermine  in April but has since regained weight, with a recent increase of one pound since the last visit. Contributing factors include limited coverage for anti-obesity medications, inconsistent adherence to lifestyle changes, and decreased physical activity and increased food intake due to the COVID-19 pandemic. She has expressed renewed commitment to weight management, motivated by personal reasons and support from a friend assisting with exercise and nutrition. Currently taking phentermine , initially at half a tablet, now increased to a full tablet. Emphasized the importance of consistent lifestyle changes, including diet and exercise, as a foundation for successful weight management. Discussed potential need for medications and possibly surgery in the future, highlighting the importance of a solid foundation in lifestyle changes before considering surgical options.  - Increase phentermine  to one tablet daily and send prescription to [facility].  If no weight loss at the higher dose medication will be discontinued.  She is taking topiramate  for other medical reasons. - Provide a new nutritional guide targeting specific calorie intake. - Encourage continuation of the exercise routine provided by her friend. - Advise a low carb, high protein meal plan, including protein shakes or smoothies as meal replacements. - Encourage journaling of food intake and exercise routine to maintain consistency. - Schedule follow-up appointment in one month to assess progress, with a goal of losing five pounds.

## 2023-11-27 NOTE — Assessment & Plan Note (Signed)
 Her last A1c was 5.8 and improved from previous 6.3 but patient is at risk for progression.  She had been on metformin  in the past but medication was discontinued because of increase in bicarbonate while also taking topiramate .  Her insurance no longer covers GLP-1.  Continue with nutritional and behavioral strategies

## 2023-12-04 ENCOUNTER — Telehealth: Payer: Self-pay | Admitting: Internal Medicine

## 2023-12-04 NOTE — Telephone Encounter (Signed)
 Confirmed appt for 9/25

## 2023-12-05 ENCOUNTER — Ambulatory Visit: Admitting: Internal Medicine

## 2023-12-05 ENCOUNTER — Telehealth (HOSPITAL_BASED_OUTPATIENT_CLINIC_OR_DEPARTMENT_OTHER): Admitting: Internal Medicine

## 2023-12-05 DIAGNOSIS — Z538 Procedure and treatment not carried out for other reasons: Secondary | ICD-10-CM

## 2023-12-05 NOTE — Progress Notes (Signed)
 This was supposed to be a video visit.  When I connected with patient via video she was driving at the time and could not pull over.  Advised patient that due to her safety it is best that we do not do the video visit while she is driving.  I will have her rescheduled.  Patient was agreeable to this.

## 2023-12-25 ENCOUNTER — Encounter (INDEPENDENT_AMBULATORY_CARE_PROVIDER_SITE_OTHER): Payer: Self-pay | Admitting: Internal Medicine

## 2023-12-25 ENCOUNTER — Ambulatory Visit (INDEPENDENT_AMBULATORY_CARE_PROVIDER_SITE_OTHER): Admitting: Internal Medicine

## 2023-12-25 DIAGNOSIS — Z6841 Body Mass Index (BMI) 40.0 and over, adult: Secondary | ICD-10-CM

## 2023-12-25 DIAGNOSIS — R7303 Prediabetes: Secondary | ICD-10-CM | POA: Diagnosis not present

## 2023-12-25 DIAGNOSIS — E66813 Obesity, class 3: Secondary | ICD-10-CM

## 2023-12-25 DIAGNOSIS — R638 Other symptoms and signs concerning food and fluid intake: Secondary | ICD-10-CM | POA: Diagnosis not present

## 2023-12-25 DIAGNOSIS — G4733 Obstructive sleep apnea (adult) (pediatric): Secondary | ICD-10-CM | POA: Diagnosis not present

## 2023-12-25 MED ORDER — PHENTERMINE HCL 37.5 MG PO TABS: 37.5000 mg | ORAL_TABLET | Freq: Every day | ORAL | 0 refills | Status: DC

## 2023-12-25 NOTE — Assessment & Plan Note (Signed)
 Obesity with recent rapid weight loss of 10 pounds over four weeks. Body fat percentage decreased from 56% to 52%, and fat mass reduced from 183 pounds to 164 pounds over the last 4 months. Increased physical activity, tracking and journaling nutrition and phentermine  use have contributed to weight loss. She adheres to a 1500 calorie per day diet and engages in regular exercise, including seated and standing exercises. Phentermine  effectively suppresses appetite and reduces food cravings. She is motivated and has a supportive environment, contributing to her success.  - Continue phentermine  prescription and send to [facility]. - Encourage continued adherence to 1500 calorie diet. - Reinforce the importance of regular physical activity and maintaining exercise intensity. - Schedule follow-up appointment in one month. -Continue excellent self-monitoring strategies

## 2023-12-25 NOTE — Assessment & Plan Note (Signed)
 Untreated she has been educated on the bidirectional relationship of sleep apnea and weight.

## 2023-12-25 NOTE — Assessment & Plan Note (Signed)
 Improved on phentermine . She has increased orexigenic signaling, impaired satiety and inhibitory control. This is secondary to an abnormal energy regulation system and pathological neurohormonal pathways characteristic of excess adiposity.  In addition to nutritional and behavioral strategies she benefits from pharmacotherapy.

## 2023-12-25 NOTE — Assessment & Plan Note (Signed)
 Her last A1c was 5.8 and improved from previous 6.3 but patient is at risk for progression.  She had been on metformin  in the past but medication was discontinued because of increase in bicarbonate while also taking topiramate .  Her insurance no longer covers GLP-1.  Continue with nutritional and behavioral strategies

## 2023-12-25 NOTE — Progress Notes (Signed)
 Office: [phone]  /  Fax: [phone]  Weight Summary and Body Composition Analysis (BIA)  Vitals Temp: 98.8 F (37.1 C) BP: 137/83 Pulse Rate: 93 SpO2: 100 %   Anthropometric Measurements Height: 5' 5 (1.651 m) Weight: (!) 312 lb (141.5 kg) BMI (Calculated): 51.92 Weight at Last Visit: 322 lb Weight Lost Since Last Visit: 7 lb Weight Gained Since Last Visit: 0 lb Starting Weight: 321 Peak Weight: 321   Body Composition  Body Fat %: 52.5 % Fat Mass (lbs): 164 lbs Muscle Mass (lbs): 140.8 lbs Total Body Water (lbs): 106.8 lbs Visceral Fat Rating : 20    RMR: 2016  Today's Visit #: 22  Starting Date: 03/14/22   Subjective   Chief Complaint: Obesity  Interval History Discussed the use of AI scribe software for clinical note transcription with the patient, who gave verbal consent to proceed.  History of Present Illness Jenna [name] is a 53 year old female who presents for medical weight management.   She has lost ten pounds since her last visit, with her current weight being 312 pounds, down from 322 pounds. She diligently tracks her progress in a notebook, detailing her exercise routine and dietary intake.  Last office visit we also increased phentermine  to 37.5 mg once a day  Her exercise routine has increased, averaging about 40 minutes per session, with some sessions lasting up to an hour. Exercises include seated marching, crunches, and standing obliques, adapted due to knee issues. She feels more energetic and stronger, although she acknowledges needing more sleep.  She is currently taking phentermine , which has helped suppress her appetite and reduce food cravings. It has not affected her sleep, which she attributes to her daily schedule. She consumes around 1500 calories per day, occasionally exceeding this by about 80 calories, and feels satisfied with her current intake.  Dietary changes include replacing bread with wheat wraps and  incorporating protein powder and feta cheese into meals. She is satisfied with these changes and has discovered new foods that she enjoys.     Challenges affecting patient progress: orthopedic problems, medical conditions or chronic pain affecting mobility.    Pharmacotherapy for weight management: She is currently taking Phentermine  (longterm use, off-label, single agent)  with adequate clinical response  and without side effects..   Assessment and Plan   Treatment Plan For Obesity:  Recommended Dietary Goals  Jenna [name] is currently in the action stage of change. As such, her goal is to continue weight management plan. She has agreed to: continue current plan  Behavioral Health and Counseling  We discussed the following behavioral modification strategies today: continue to work on maintaining a reduced calorie state, getting the recommended amount of protein, incorporating whole foods, making healthy choices, staying well hydrated and practicing mindfulness when eating. and increase protein intake, fibrous foods (25 grams per day for women, 30 grams for men) and water to improve satiety and decrease hunger signals. .  Additional education and resources provided today: None  Recommended Physical Activity Goals  Jenna [name] has been advised to work up to 150 minutes of moderate intensity aerobic activity a week and strengthening exercises 2-3 times per week for cardiovascular health, weight loss maintenance and preservation of muscle mass.  She has agreed to :  Increase volume of physical activity to a goal of 240 minutes a week and Combine aerobic and strengthening exercises for efficiency and improved cardiometabolic health.  Medical Interventions and Pharmacotherapy  We discussed various medication options to help Jenna [name] with  her weight loss efforts and we both agreed to : Adequate clinical response to anti-obesity medication, continue current regimen  Associated Conditions  Impacted by Obesity Treatment  Assessment & Plan Class 3 severe obesity with serious comorbidity and body mass index (BMI) of 50.0 to 59.9 in adult, unspecified obesity type (HCC) Obesity with recent rapid weight loss of 10 pounds over four weeks. Body fat percentage decreased from 56% to 52%, and fat mass reduced from 183 pounds to 164 pounds over the last 4 months. Increased physical activity, tracking and journaling nutrition and phentermine  use have contributed to weight loss. She adheres to a 1500 calorie per day diet and engages in regular exercise, including seated and standing exercises. Phentermine  effectively suppresses appetite and reduces food cravings. She is motivated and has a supportive environment, contributing to her success.  - Continue phentermine  prescription and send to [facility]. - Encourage continued adherence to 1500 calorie diet. - Reinforce the importance of regular physical activity and maintaining exercise intensity. - Schedule follow-up appointment in one month. -Continue excellent self-monitoring strategies OSA (obstructive sleep apnea) Untreated she has been educated on the bidirectional relationship of sleep apnea and weight.  Prediabetes Her last A1c was 5.8 and improved from previous 6.3 but patient is at risk for progression.  She had been on metformin  in the past but medication was discontinued because of increase in bicarbonate while also taking topiramate .  Her insurance no longer covers GLP-1.  Continue with nutritional and behavioral strategies Abnormal food appetite Improved on phentermine . She has increased orexigenic signaling, impaired satiety and inhibitory control. This is secondary to an abnormal energy regulation system and pathological neurohormonal pathways characteristic of excess adiposity.  In addition to nutritional and behavioral strategies she benefits from pharmacotherapy.          Objective   Physical Exam:  Blood pressure  137/83, pulse 93, temperature 98.8 F (37.1 C), height 5' 5 (1.651 m), weight (!) 312 lb (141.5 kg), last menstrual period 07/30/2023, SpO2 100%. Body mass index is 51.92 kg/m.  General: She is overweight, cooperative, alert, well developed, and in no acute distress. PSYCH: Has normal mood, affect and thought process.   HEENT: EOMI, sclerae are anicteric. Lungs: Normal breathing effort, no conversational dyspnea. Extremities: No edema.  Neurologic: No gross sensory or motor deficits. No tremors or fasciculations noted.    Diagnostic Data Reviewed:  BMET    Component Value Date/Time   NA 140 07/23/2022 1213   K 4.7 07/23/2022 1213   CL 108 (H) 07/23/2022 1213   CO2 19 (L) 07/23/2022 1213   GLUCOSE 105 (H) 07/23/2022 1213   GLUCOSE 118 (H) 12/20/2021 1430   BUN 8 07/23/2022 1213   CREATININE 0.89 07/23/2022 1213   CALCIUM 9.0 07/23/2022 1213   GFRNONAA 55 (L) 12/20/2021 1430   GFRAA >60 09/25/2019 1548   Lab Results  Component Value Date   HGBA1C 5.8 04/04/2023   HGBA1C 5.6 12/03/2017   Lab Results  Component Value Date   INSULIN  41.6 (H) 03/14/2022   INSULIN  7.4 12/08/2018   Lab Results  Component Value Date   TSH 1.655 12/20/2021   CBC    Component Value Date/Time   WBC 8.1 12/20/2021 1430   RBC 4.57 12/20/2021 1430   HGB 12.5 12/20/2021 1430   HGB 12.5 01/23/2021 1525   HCT 38.9 12/20/2021 1430   HCT 37.4 01/23/2021 1525   PLT 333 12/20/2021 1430   PLT 284 01/23/2021 1525   MCV 85.1 12/20/2021 1430  MCV 88 01/23/2021 1525   MCH 27.4 12/20/2021 1430   MCHC 32.1 12/20/2021 1430   RDW 14.6 12/20/2021 1430   RDW 15.1 01/23/2021 1525   Iron Studies    Component Value Date/Time   IRON 53 04/02/2019 1510   TIBC 360 04/02/2019 1510   FERRITIN 26 04/02/2019 1510   IRONPCTSAT 15 04/02/2019 1510   Lipid Panel     Component Value Date/Time   CHOL 140 03/14/2022 1030   TRIG 68 03/14/2022 1030   HDL 46 03/14/2022 1030   CHOLHDL 3.8 10/06/2020 1458    LDLCALC 80 03/14/2022 1030   Hepatic Function Panel     Component Value Date/Time   PROT 6.8 07/23/2022 1213   ALBUMIN 3.9 07/23/2022 1213   AST 24 07/23/2022 1213   ALT 28 07/23/2022 1213   ALKPHOS 81 07/23/2022 1213   BILITOT 0.4 07/23/2022 1213      Component Value Date/Time   TSH 1.655 12/20/2021 1430   TSH 1.430 01/23/2021 1525   Nutritional Lab Results  Component Value Date   VD25OH 28.3 (L) 07/23/2022   VD25OH 14.6 (L) 03/14/2022   VD25OH 14.8 (L) 01/23/2021    Medications: Outpatient Encounter Medications as of 12/25/2023  Medication Sig   diclofenac  (VOLTAREN ) 75 MG EC tablet Take 1 tablet (75 mg total) by mouth 2 (two) times daily as needed.   DULoxetine  (CYMBALTA ) 60 MG capsule Take 1 capsule (60 mg total) by mouth daily for back pain and mood.Must have office visit for refills   ferrous sulfate  325 (65 FE) MG tablet Take 325 mg by mouth every Wednesday.   furosemide  (LASIX ) 20 MG tablet Take 1 tablet (20 mg total) by mouth as needed for edema or fluid.   gabapentin  (NEURONTIN ) 300 MG capsule Take 1 capsule (300 mg total) by mouth daily as needed.   hydrOXYzine  (ATARAX ) 25 MG tablet Take 1-2 tablets (25-50 mg total) by mouth every 6 (six) to 8 (eight) hours as needed for itching.   ondansetron  (ZOFRAN ) 4 MG tablet Take 1 tablet (4 mg total) by mouth every 8 (eight) hours as needed for nausea or vomiting.   rOPINIRole  (REQUIP ) 0.5 MG tablet TAKE 1 TABLET BY MOUTH AT  BEDTIME   tiZANidine  (ZANAFLEX ) 4 MG tablet Take 1 tablet (4 mg total) by mouth every 8 (eight) hours as needed for muscle spasms.   topiramate  (TOPAMAX ) 25 MG tablet Take 3 tablets (75 mg total) by mouth at bedtime.   Vitamin D , Ergocalciferol , (DRISDOL ) 1.25 MG (50000 UNIT) CAPS capsule Take 1 capsule (50,000 Units total) by mouth every 7 (seven) days.   [DISCONTINUED] phentermine  (ADIPEX-P ) 37.5 MG tablet Take 1 tablet (37.5 mg total) by mouth daily before breakfast.   phentermine  (ADIPEX-P ) 37.5 MG  tablet Take 1 tablet (37.5 mg total) by mouth daily before breakfast.   No facility-administered encounter medications on file as of 12/25/2023.     Follow-Up   Return in about 4 weeks (around 01/22/2024) for For Weight Mangement with Dr. Francyne.Jenna [name] She was informed of the importance of frequent follow up visits to maximize her success with intensive lifestyle modifications for her multiple health conditions.  Attestation Statement   Reviewed by clinician on day of visit: allergies, medications, problem list, medical history, surgical history, family history, social history, and previous encounter notes.     Lucas Francyne, MD

## 2024-01-09 ENCOUNTER — Ambulatory Visit: Attending: Internal Medicine | Admitting: Internal Medicine

## 2024-01-09 DIAGNOSIS — M545 Low back pain, unspecified: Secondary | ICD-10-CM

## 2024-01-09 DIAGNOSIS — Z87891 Personal history of nicotine dependence: Secondary | ICD-10-CM

## 2024-01-09 DIAGNOSIS — F32A Depression, unspecified: Secondary | ICD-10-CM | POA: Diagnosis not present

## 2024-01-09 DIAGNOSIS — R0683 Snoring: Secondary | ICD-10-CM

## 2024-01-09 DIAGNOSIS — G8929 Other chronic pain: Secondary | ICD-10-CM

## 2024-01-09 DIAGNOSIS — L500 Allergic urticaria: Secondary | ICD-10-CM

## 2024-01-09 MED ORDER — HYDROXYZINE HCL 25 MG PO TABS
25.0000 mg | ORAL_TABLET | Freq: Four times a day (QID) | ORAL | 0 refills | Status: AC
Start: 2024-01-09 — End: ?

## 2024-01-09 MED ORDER — DULOXETINE HCL 30 MG PO CPEP: 30.0000 mg | ORAL_CAPSULE | Freq: Every day | ORAL | 3 refills | Status: AC

## 2024-01-09 NOTE — Progress Notes (Signed)
 Patient ID: Jenna [name], female    DOB: 04/07/1970  MRN: [id number]  CC: Medical Management of Chronic Issues   Subjective: Jenna [name] is a 53 y.o. female who presents for chronic ds management. Her concerns today include:  Patient with history of morbid obesity on Wegovy  through [facility], preDM, OA of the knees, chronic lower back pain.  Depression (on Cymbalta  for LBP and mood), vitamin D  deficiency   Discussed the use of AI scribe software for clinical note transcription with the patient, who gave verbal consent to proceed.  History of Present Illness Jenna [name] is a 53 year old female who presents for a routine follow-up.  She is actively working on weight reduction to qualify for knee replacement surgery. Followed by [facility]. Previously on Wegovy , she switched to phentermine  one month ago due to insurance coverage issues. She takes phentermine  37.5 mg daily, which effectively suppresses her appetite, allowing her to eat smaller portions. Her exercise routine, initially three days a week with a trainer, has increased to five days a week at home.  She has a history of loud snoring, waking up with a dry throat, feeling tired in the mornings, and experiencing morning headaches. She also feels sleepy during the day but denies falling asleep while driving. These symptoms have persisted for the past three to five years. The patient had a sleep study in 2023 that showed loud snoring and PLM. She was suppose to have another home sleep study earlier this yr given persistent symptoms and weight changes. She missed an appt with [facility] to pick up and be shown how to set up the home kit due to scheduling conflicts.  She takes Cymbalta  60 mg as needed for depression, back pain, and knee pain. Prescribed to take daily not PRN. Cymbalta  makes her feel sleepy and dazed, so she takes it twice a week instead of daily. Initially taken daily, she reduced the frequency due to these side effects. Cymbalta   helps with her back pain when taken.  Request refill on hydroxyzine  which she takes for itching.    Patient Active Problem List   Diagnosis Date Noted   History of colonic polyps 06/20/2023   Adenomatous polyp of ascending colon 06/20/2023   Weight gain due to medication 05/27/2023   Elevated blood pressure reading without diagnosis of hypertension 04/02/2023   Abnormal food appetite 04/02/2023   OSA (obstructive sleep apnea) 11/08/2022   Bilateral leg edema 07/23/2022   Benign neoplasm of ascending colon 07/19/2022   Acute kidney injury 03/28/2022   Prediabetes 03/28/2022   Other spondylosis with radiculopathy, cervical region 10/03/2021   Fecal occult blood test positive 07/15/2021   Periodic limb movement 07/11/2021   Loud snoring 01/23/2021   Severe obesity (BMI >= 40) (HCC) 02/08/2020   Insulin  resistance 07/28/2019   Depression 07/28/2019   Vitamin D  deficiency 12/09/2018   Class 3 severe obesity with serious comorbidity and body mass index (BMI) of 50.0 to 59.9 in adult, unspecified obesity type 01/17/2017   Low back pain 05/17/2016   Unilateral primary osteoarthritis, left knee 05/08/2016   Primary osteoarthritis of both knees 06/27/2015   Closed right fibular fracture 08/10/2014     Current Outpatient Medications on File Prior to Visit  Medication Sig Dispense Refill   diclofenac  (VOLTAREN ) 75 MG EC tablet Take 1 tablet (75 mg total) by mouth 2 (two) times daily as needed. 60 tablet 1   ferrous sulfate  325 (65 FE) MG tablet Take 325 mg by mouth every  Wednesday.     furosemide  (LASIX ) 20 MG tablet Take 1 tablet (20 mg total) by mouth as needed for edema or fluid. 30 tablet 0   gabapentin  (NEURONTIN ) 300 MG capsule Take 1 capsule (300 mg total) by mouth daily as needed. 60 capsule 3   phentermine  (ADIPEX-P ) 37.5 MG tablet Take 1 tablet (37.5 mg total) by mouth daily before breakfast. 30 tablet 0   rOPINIRole  (REQUIP ) 0.5 MG tablet TAKE 1 TABLET BY MOUTH AT  BEDTIME 90  tablet 3   tiZANidine  (ZANAFLEX ) 4 MG tablet Take 1 tablet (4 mg total) by mouth every 8 (eight) hours as needed for muscle spasms. 60 tablet 0   topiramate  (TOPAMAX ) 25 MG tablet Take 3 tablets (75 mg total) by mouth at bedtime. 270 tablet 3   Vitamin D , Ergocalciferol , (DRISDOL ) 1.25 MG (50000 UNIT) CAPS capsule Take 1 capsule (50,000 Units total) by mouth every 7 (seven) days. 12 capsule 1   ondansetron  (ZOFRAN ) 4 MG tablet Take 1 tablet (4 mg total) by mouth every 8 (eight) hours as needed for nausea or vomiting. (Patient not taking: Reported on 01/09/2024) 30 tablet 0   No current facility-administered medications on file prior to visit.    Allergies  Allergen Reactions   Diflucan  [Fluconazole ] Hives   Metformin  And Related Other (See Comments)    Decreased bicarbonate    Social History   Socioeconomic History   Marital status: Single    Spouse name: Not on file   Number of children: Not on file   Years of education: Not on file   Highest education level: GED or equivalent  Occupational History   Occupation: Stay at home, disabled  Tobacco Use   Smoking status: Former    Current packs/day: 0.00    Types: Cigarettes    Quit date: 10/10/2021    Years since quitting: 2.2   Smokeless tobacco: Never  Vaping Use   Vaping status: Never Used  Substance and Sexual Activity   Alcohol use: No    Alcohol/week: 0.0 standard drinks of alcohol   Drug use: No   Sexual activity: Not Currently  Other Topics Concern   Not on file  Social History Narrative   Not on file   Social Drivers of Health   Financial Resource Strain: Low Risk  (01/09/2024)   Overall Financial Resource Strain (CARDIA)    Difficulty of Paying Living Expenses: Not hard at all  Food Insecurity: No Food Insecurity (01/09/2024)   Hunger Vital Sign    Worried About Running Out of Food in the Last Year: Never true    Ran Out of Food in the Last Year: Never true  Transportation Needs: No Transportation Needs  (01/09/2024)   PRAPARE - Administrator, Civil Service (Medical): No    Lack of Transportation (Non-Medical): No  Physical Activity: Insufficiently Active (12/05/2023)   Exercise Vital Sign    Days of Exercise per Week: 3 days    Minutes of Exercise per Session: 20 min  Stress: No Stress Concern Present (12/05/2023)   [employer] of Occupational Health - Occupational Stress Questionnaire    Feeling of Stress: Not at all  Social Connections: Unknown (01/09/2024)   Social Connection and Isolation Panel    Frequency of Communication with Friends and Family: More than three times a week    Frequency of Social Gatherings with Friends and Family: Three times a week    Attends Religious Services: More than 4 times per year  Active Member of Clubs or Organizations: Yes    Attends Banker Meetings: 1 to 4 times per year    Marital Status: Patient declined  Intimate Partner Violence: Unknown (06/14/2021)   Received from [facility]   HITS    Physically Hurt: Not on file    Insult or Talk Down To: Not on file    Threaten Physical Harm: Not on file    Scream or Curse: Not on file    Family History  Problem Relation Age of Onset   Colon polyps Mother    Diabetes Mother    Hypertension Mother    Hypertension Father    Breast cancer Neg Hx    Colon cancer Neg Hx    Esophageal cancer Neg Hx    Rectal cancer Neg Hx    Stomach cancer Neg Hx     Past Surgical History:  Procedure Laterality Date   CHOLECYSTECTOMY     COLONOSCOPY     COLONOSCOPY WITH PROPOFOL  N/A 07/19/2022   Procedure: COLONOSCOPY WITH PROPOFOL ;  Surgeon: Stacia Glendia BRAVO, MD;  Location[facility] ENDOSCOPY;  Service: Gastroenterology;  Laterality: N/A;   COLONOSCOPY WITH PROPOFOL  N/A 10/25/2022   Procedure: COLONOSCOPY WITH PROPOFOL ;  Surgeon: Wilhelmenia Aloha Raddle., MD;  Location: [facility] ENDOSCOPY;  Service: Gastroenterology;  Laterality: N/A;   COLONOSCOPY WITH PROPOFOL  N/A 06/20/2023    Procedure: COLONOSCOPY WITH PROPOFOL ;  Surgeon: Mansouraty, Aloha Raddle., MD;  Location: [facility] ENDOSCOPY;  Service: Gastroenterology;  Laterality: N/A;   ENDOSCOPIC MUCOSAL RESECTION N/A 10/25/2022   Procedure: ENDOSCOPIC MUCOSAL RESECTION;  Surgeon: Wilhelmenia Aloha Raddle., MD;  Location: [facility] ENDOSCOPY;  Service: Gastroenterology;  Laterality: N/A;   HEMOSTASIS CLIP PLACEMENT  10/25/2022   Procedure: HEMOSTASIS CLIP PLACEMENT;  Surgeon: Wilhelmenia Aloha Raddle., MD;  Location: [facility] ENDOSCOPY;  Service: Gastroenterology;;   HOT HEMOSTASIS N/A 10/25/2022   Procedure: HOT HEMOSTASIS (ARGON PLASMA COAGULATION/BICAP);  Surgeon: Wilhelmenia Aloha Raddle., MD;  Location: [facility]OS[facility];  Service: Gastroenterology;  Laterality: N/A;   POLYPECTOMY  07/19/2022   Procedure: POLYPECTOMY;  Surgeon: Stacia Glendia BRAVO, MD;  Location: [facility] ENDOSCOPY;  Service: Gastroenterology;;   POLYPECTOMY  06/20/2023   Procedure: POLYPECTOMY, INTESTINE;  Surgeon: Wilhelmenia Aloha Raddle., MD;  Location: [facility] ENDOSCOPY;  Service: Gastroenterology;;   SUBMUCOSAL INJECTION  06/20/2023   Procedure: INJECTION, SUBMUCOSAL;  Surgeon: Wilhelmenia Aloha Raddle., MD;  Location: [facility] ENDOSCOPY;  Service: Gastroenterology;;   SUBMUCOSAL LIFTING INJECTION  07/19/2022   Procedure: SUBMUCOSAL LIFTING INJECTION;  Surgeon: Stacia Glendia BRAVO, MD;  Location: [facility] ENDOSCOPY;  Service: Gastroenterology;;   SUBMUCOSAL LIFTING INJECTION  10/25/2022   Procedure: SUBMUCOSAL LIFTING INJECTION;  Surgeon: Wilhelmenia Aloha Raddle., MD;  Location: [facility] ENDOSCOPY;  Service: Gastroenterology;;   SUBMUCOSAL TATTOO INJECTION  10/25/2022   Procedure: SUBMUCOSAL TATTOO INJECTION;  Surgeon: Wilhelmenia Aloha Raddle., MD;  Location: [facility] ENDOSCOPY;  Service: Gastroenterology;;   TUBAL LIGATION      ROS: Review of Systems Negative except as stated above  PHYSICAL EXAM: BP 127/78 (BP Location: Left Arm, Patient Position: Sitting, Cuff Size: Normal)   Pulse 93   Resp 20   Ht 5' 5  (1.651 m)   Wt (!) 318 lb (144.2 kg) Comment: Patient reports 308 at [facility].  LMP 07/30/2023   SpO2 98%   BMI 52.92 kg/m   Physical Exam  General appearance - alert, well appearing, morbidly obese middle age AAF and in no distress Mental status - normal mood, behavior, speech, dress, motor activity, and thought processes Chest -  clear to auscultation, no wheezes, rales or rhonchi, symmetric air entry Heart - normal rate, regular rhythm, normal S1, S2, no murmurs, rubs, clicks or gallops Extremities - peripheral pulses normal, no pedal edema, no clubbing or cyanosis      Latest Ref Rng & Units 07/23/2022   12:13 PM 03/14/2022   10:30 AM 12/20/2021    2:30 PM  CMP  Glucose 70 - 99 mg/dL 894  899  881   BUN 6 - 24 mg/dL 8  10  10    Creatinine 0.57 - 1.00 mg/dL 9.10  9.13  8.78   Sodium 134 - 144 mmol/L 140  139  140   Potassium 3.5 - 5.2 mmol/L 4.7  4.7  4.0   Chloride 96 - 106 mmol/L 108  106  109   CO2 20 - 29 mmol/L 19  20  21    Calcium 8.7 - 10.2 mg/dL 9.0  9.6  9.8   Total Protein 6.0 - 8.5 g/dL 6.8  7.0  7.9   Total Bilirubin 0.0 - 1.2 mg/dL 0.4  0.4  0.5   Alkaline Phos 44 - 121 IU/L 81  78  72   AST 0 - 40 IU/L 24  17  28    ALT 0 - 32 IU/L 28  17  24     Lipid Panel     Component Value Date/Time   CHOL 140 03/14/2022 1030   TRIG 68 03/14/2022 1030   HDL 46 03/14/2022 1030   CHOLHDL 3.8 10/06/2020 1458   LDLCALC 80 03/14/2022 1030    CBC    Component Value Date/Time   WBC 8.1 12/20/2021 1430   RBC 4.57 12/20/2021 1430   HGB 12.5 12/20/2021 1430   HGB 12.5 01/23/2021 1525   HCT 38.9 12/20/2021 1430   HCT 37.4 01/23/2021 1525   PLT 333 12/20/2021 1430   PLT 284 01/23/2021 1525   MCV 85.1 12/20/2021 1430   MCV 88 01/23/2021 1525   MCH 27.4 12/20/2021 1430   MCHC 32.1 12/20/2021 1430   RDW 14.6 12/20/2021 1430   RDW 15.1 01/23/2021 1525   LYMPHSABS 3.8 12/20/2021 1430   LYMPHSABS 2.7 10/06/2020 1458   MONOABS 0.6 12/20/2021 1430    EOSABS 0.2 12/20/2021 1430   EOSABS 0.3 10/06/2020 1458   BASOSABS 0.1 12/20/2021 1430   BASOSABS 0.0 10/06/2020 1458    ASSESSMENT AND PLAN: 1. Morbid obesity (HCC) (Primary) Patient on phentermine  through medical weight management. Encouraged her to continue to try to eat healthy and smaller portions. Encouraged her to try to move is much as her knees will allow.  2. Mild episode of depression Since she is unable to tolerate the Cymbalta  60 mg daily but does get benefit when she takes it, we discussed decreasing the dose to 30 mg to see if she tolerates the lower dose better.  She is agreeable to this. - DULoxetine  (CYMBALTA ) 30 MG capsule; Take 1 capsule (30 mg total) by mouth daily.  Dispense: 30 capsule; Refill: 3  3. Urticaria due to drug allergy - hydrOXYzine  (ATARAX ) 25 MG tablet; Take 1-2 tablets (25-50 mg total) by mouth every 6 (six) to 8 (eight) hours as needed for itching.  Dispense: 30 tablet; Refill: 0  4. Chronic low back pain, unspecified back pain laterality, unspecified whether sciatica present - DULoxetine  (CYMBALTA ) 30 MG capsule; Take 1 capsule (30 mg total) by mouth daily.  Dispense: 30 capsule; Refill: 3  5. Loud snoring She will check with [facility]  to see whether she is still able to pick up the home sleep kit or whether a new order needs to be placed.    Patient was given the opportunity to ask questions.  Patient verbalized understanding of the plan and was able to repeat key elements of the plan.   This documentation was completed using Paediatric nurse.  Any transcriptional errors are unintentional.  No orders of the defined types were placed in this encounter.    Requested Prescriptions   Signed Prescriptions Disp Refills   hydrOXYzine  (ATARAX ) 25 MG tablet 30 tablet 0    Sig: Take 1-2 tablets (25-50 mg total) by mouth every 6 (six) to 8 (eight) hours as needed for itching.   DULoxetine  (CYMBALTA ) 30 MG capsule 30  capsule 3    Sig: Take 1 capsule (30 mg total) by mouth daily.    Return in about 4 months (around 05/09/2024).  Barnie Louder, MD, FACP

## 2024-01-09 NOTE — Patient Instructions (Addendum)
  VISIT SUMMARY: Today, we discussed your ongoing efforts to lose weight in preparation for knee replacement surgery, your chronic low back pain, depression, and sleep-related issues. We reviewed your current medications and made some adjustments to better manage your symptoms.  YOUR PLAN: -MORBID OBESITY DUE TO EXCESS CALORIES: Morbid obesity means having a body weight that is significantly higher than what is considered healthy. You are working on weight reduction to qualify for knee replacement surgery. Continue taking phentermine  37.5 mg daily to help control your appetite, and keep up with your weight loss efforts.  -LOW BACK PAIN: Chronic low back pain is long-lasting pain in the lower back. We will reduce your Cymbalta  dose to 30 mg daily to help manage your pain without causing too much sedation. A prescription for Cymbalta  30 mg has been sent to [facility].  -DEPRESSION: Depression is a mood disorder that causes persistent feelings of sadness and loss of interest. We will reduce your Cymbalta  dose to 30 mg daily to help manage your depression without causing too much sedation. A prescription for Cymbalta  30 mg has been sent to [facility].  -SNORING AND LIMB MOVEMENT SLEEP DISORDER: Snoring and limb movement sleep disorder involve loud snoring and involuntary movements during sleep, which can affect sleep quality. You need to call the sleep study center to inquire about the home sleep study kit. If a new order is needed, please send a MyChart message.  INSTRUCTIONS: Please follow up with the sleep study center to get the home sleep study kit. If you need a new order for the study, send a MyChart message. Continue taking phentermine  37.5 mg daily and start taking Cymbalta  30 mg daily as prescribed. Keep up with your weight loss efforts and exercise routine.                      Contains text generated by Abridge.                                  Contains text generated by Abridge.                          Contains text generated by Abridge.                                 Contains text generated by Abridge.

## 2024-01-13 ENCOUNTER — Encounter: Payer: Self-pay | Admitting: Radiology

## 2024-01-17 NOTE — Discharge Summary (Signed)
 [facility]  Discharge Summary   Name: Jenna [name] Age: 53 yrs  MRN: [id number] DOB: [birth year]  Admit Date: 01/14/2024 Admitting Physician: Glo Stanly Oris, MD  Discharge Date: 01/17/2024 No discharge date for patient encounter. Discharge Physician: Signe Dominick Ramus, MD Signe Dominick Ramus, MD    Discharge Diagnoses:   Principal Problem:   Abdominal pain in female Active Problems:   Class 3 severe obesity due to excess calories without serious comorbidity with body mass index (BMI) of 50.0 to 59.9 in adult   Vitamin D  deficiency   Elevated blood pressure reading   Lactic acidosis Resolved Problems:   * No resolved hospital problems. *   TO DO List at Follow-up for PCP/Specialist:   Key Medication changes: added naproxen  BID, iron supplementation, bowel regimen/PPI Pending labs to follow up on: as below Incidental findings requiring follow-up: iron deficiency anemia; received 250 mg iron during hospitalization   Pending Labs     Order Current Status   Chlamydia Culture In process   Gonorrhea Screening Culture Preliminary result        Hospital Course:   For full details, please see H&P, progress notes, consult notes and ancillary notes. Briefly, Jenna [name] is a 53 y.o. year old female who presents today with the below chief complaint and presentation history obtained by hospitalist:  Jenna [name] is a 53 y.o. year old female with a PMH of arthritis, neck pain, back pain, prediabetes, vitamin D  deficiency, depression, lower extremity edema, and morbid obesity with a BMI of over 50, currently on weight loss medication presented to [facility] ED on 01/13/24 with c/o abdominal pain generalized weakness and dizziness along with nausea vomiting.  Per patient abdominal pain was located on right lower quadrant intensity, pain was cramping in nature like a combination of menstrual cramps along with labor pains.  Initially pain was constant but when she got pain  medication in the ED the pain become intermittent and comes and goes.  It was radiating to her back around the hip on the right side, pain was unbearable so she presented to the ED. she was having vomiting overnight at least 5 episodes of vomiting denied for any diarrhea or constipation.  She denied for any foul-smelling secretions or discharge from the vagina.  Abdominal pain was associated with shaking and chills along with dizziness but she denied for any recorded fever or any cough or chest pain Per patient she has is perimenopausal; periods have been less frequent after after 9 months without, up until last month when she started having heavy bleeding along with clots again which lasted for 6 days and then stopped again.  Patient was having worsening of the pain with the flexion at the right hip joint and adduction but not that much worse with the abduction.  Toradol  helped her with the pain as compared to other medications she received.  Patient currently on phentermine  for her weight loss medication and has been losing weight recently.  Per patient she has not been sexually active for last couple of months. . The patient's hospital course will be summarized in a problem based approach below.   LLQ abdominal pain Cramping and resopnsive to NSAIDs; has a likely corpus leuteum cyst on that side noted after gyn consult and CT/pelvic US  without abnromal findings; likely source of pain after recent period during perimenopause. Naproxen  BID on DC.  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 Pt prefers to DC. Resumed gabapentin .  Migraines Managed with Topamax . Discontinuation results in severe headaches, blurry vision, and dizziness. - Continue Topamax . Query if iron deficiency, obesity etc contribute in fact to IIH but no reoprted history of pailliedema despite visual effects of headache.  Chronic anemia due to iron deficiency due to acute and chronic  blood loss. Restless legs. Iron deficit 1.25 grams. Received 250 mg iron during hosptialization. Started on PO iron supplementation and recommend eval for further infusions. Hopeful that recovery of iron sufficiency will reduce RLS and reduce need for requip .  Constipation Miralax, senokot.   The patient's chronic medical conditions were treated accordingly per the patient's home medication regimen except as noted in the plan above and in the medication list below.    Discharge Condition:   Disposition: Patient discharged to Home or Self Care in good condition.  Diet at discharge:    Dietary Orders  (From admission, onward)               Ordered    Adult Diet- Mediterranean  Diet effective now       References:    Medical Nutrition Management (MNM) for Registered Dietitian  Question Answer Comment  Diet type: Mediterranean   Medical Nutrition Management By RD Yes, Medical Nutrition Management By RD      01/14/24 1818            Activity at Discharge: Ambulate ad lib  Wound/Incision Care Instructions:   Photo of wound/incision:        Physical Exam at Discharge   BP 116/62 (BP Location: Left arm, Patient Position: Lying)   Pulse 92   Temp 97.9 F (36.6 C) (Oral)   Resp 18   Ht 1.651 m (5' 5)   Wt (!) 143 kg (315 lb 7.7 oz)   SpO2 97%   BMI 52.50 kg/m  Physical Exam Constitutional:      General: She is not in acute distress.    Appearance: She is not toxic-appearing.  Cardiovascular:     Rate and Rhythm: Normal rate.     Pulses: Normal pulses.  Pulmonary:     Effort: Pulmonary effort is normal.     Breath sounds: Normal breath sounds.  Abdominal:     General: Abdomen is flat. Bowel sounds are normal.     Tenderness: There is no abdominal tenderness.  Musculoskeletal:     Right lower leg: No edema.     Left lower leg: No edema.  Skin:    Findings: No lesion.  Neurological:     General: No focal deficit present.     Mental Status: She is  alert.       Discharge Medications:      Medication List     PAUSE taking these medications    DULoxetine  30 mg capsule Wait to take this until your doctor or other care provider tells you to start again. Commonly known as: CYMBALTA  Take 30 mg by mouth daily.       START taking these medications    acetaminophen  500 mg tablet Commonly known as: TYLENOL  Take 2 tablets (1,000 mg total) by mouth 3 (three) times a day for 10 days.   ferrous sulfate  325 mg (65 mg iron) tablet Take 2 tablets (650 mg total) by mouth 3 (three) times a week. Take before breakfast.   gabapentin  300 mg capsule Commonly known as: NEURONTIN  Take 1 capsule (300 mg total) by mouth 3 (three) times a day.   naproxen  500 mg tablet Commonly known as:  NAPROSYN  Take 1 tablet (500 mg total) by mouth in the morning and 1 tablet (500 mg total) in the evening. Take with meals. Do all this for 14 days.   omeprazole 20 mg DR capsule Commonly known as: PriLOSEC Take 1 capsule (20 mg total) by mouth in the morning for 14 days.   polyethylene glycol 17 gram Powd powder Commonly known as: MIRALAX Mix 1 measured capful (17 g) with 4-8 ounces of juice or water. Stir until dissolved and drink daily for constipation. (Take 17 g by mouth daily.)   Stimulant Laxative Plus 8.6-50 mg per tablet Generic drug: sennosides-docusate sodium Take 1 tablet by mouth at bedtime.       CONTINUE taking these medications    hydrOXYzine  25 mg tablet Commonly known as: ATARAX  Take 25-50 mg by mouth every 6 (six) hours as needed.   phentermine  37.5 mg Tab Take 37.5 mg by mouth daily before breakfast.   rOPINIRole  0.5 mg tablet Commonly known as: REQUIP  Take 0.5 mg by mouth nightly.   topiramate  25 mg tablet Commonly known as: TOPAMAX  Take 75 mg by mouth nightly.       ASK your doctor about these medications    oxyCODONE 5 mg immediate release tablet Commonly known as: ROXICODONE Take 1 tablet (5 mg total) by  mouth every 4 hours as needed for severe pain. (Take 1 tablet (5 mg total) by mouth every 4 (four) hours as needed for severe pain (7-10).) Ask about: Should I take this medication?         Where to Get Your Medications     These medications were sent to [facility]  [address], [place] [address]    Hours: Mon-Fri 8:30am-5pm; Sat-Sun: Closed; Holidays: Closed Phone: [phone]  ferrous sulfate  325 mg (65 mg iron) tablet gabapentin  300 mg capsule naproxen  500 mg tablet omeprazole 20 mg DR capsule oxyCODONE 5 mg immediate release tablet polyethylene glycol 17 gram Powd powder Stimulant Laxative Plus 8.6-50 mg per tablet    Information about where to get these medications is not yet available   Ask your nurse or doctor about these medications acetaminophen  500 mg tablet     Significant Diagnostic Tests:   LABS:  Lab Results  Component Value Date   WBC 6.86 01/16/2024   HGB 9.7 (L) 01/16/2024   HCT 30.7 (L) 01/16/2024   PLT 253 01/16/2024   ALT 17 01/16/2024   AST 20 01/16/2024   NA 138 01/16/2024   K 4.6 01/16/2024   CL 106 01/16/2024   CREATININE 1.08 01/16/2024   BUN 9 01/16/2024   CO2 27 01/16/2024   HGBA1C 5.9 (H) 01/16/2024   IMAGING:  US  Pelvis Complete Transabd Umjwdcjh w Duplex  Final Result by Deward Franky Conn, MD [phone])  US  PELVIS COMPLETE TRANSABD TRANSVAG W DUPLEX, 01/14/2024 6:55 AM    INDICATION: concern for ovarian torsion  Subcentimeter hypoattenuating   lesions are too small to characterize but statistically benign.  ADDITIONAL HX: Patient is not yet post-menopausal.  COMPARISON: Same day CT abdomen pelvis    TRANSABDOMINAL EXAM:    TECHNIQUE: Multi-planar real-time grayscale ultrasound of the pelvis via a   transabdominal approach was performed, supplemented by color and/or power   Doppler for limited purposes of general vascularity evaluation.     FINDINGS:    .  Uterus: Unremarkable. Further  characterization as below.   .  Right ovary/adnexa: Right ovarian cyst measuring approximately 1.7 x  1.6 x 1.4 cm, possibly reflecting a corpus luteum. More detailed   characterization as discussed below.  .  Left ovary/adnexa: Left ovarian cyst measuring approximately 5.8 x 5.0   x 3.8 cm. More detailed characterization as discussed below.  .  Bladder: Unremarkable.  .  Peritoneum: No significant fluid or other abnormality identified.    TRANSVAGINAL EXAM:    TECHNIQUE: Multi-planar real-time grayscale ultrasound of the pelvis via a   transvaginal approach was performed, supplemented by color and/or power   Doppler for limited purposes of general vascularity evaluation.     FINDINGS:    UTERUS: Anteverted.  .  Size = 10.8 x 4.6 x 5.8 cm.  Morphology  .  Corpus: Unremarkable contour and echotexture.  No apparent suspicious   parenchymal lesions.  .  Endometrium: Width = 7 mm.  .  Cervix: Visualized portions unremarkable.    RIGHT Adnexa:  .  Ovary: Size = 5.8 x 3.0 x 6.0 cm. No apparent suspicious lesions.   General vascularity appears preserved. Dominant cyst with internal debris   measures 2.1 x 1.9 x 1.6 cm. Smaller cyst measures 1.2 x 1.7 x 1.1 cm with   minimal irregular wall thickening.  .  Other: No additional extra-ovarian abnormalities of note.    LEFT Adnexa:  .  Ovary: Size = 6.4 x 4.3 x 5.8 cm. No apparent suspicious lesions.   Conglomerate of cysts within the left ovary, largest measuring up to 4.4   cm. General vascularity appears preserved.  .  Other: No additional extra-ovarian abnormalities of note.    Peritoneum: Trace free fluid in the posterior cul-de-sac, surrounding the   right ovary.  Slide test: Not performed.  Other: None.      TECHNIQUE (Doppler): Color and/or power Doppler combined with spectral   Doppler analysis was performed to evaluate blood flow to and from the   ovaries.    FINDINGS:  .  Right ovary: Arterial and venous blood flow  are documented.  .  Left ovary: Arterial and venous blood flow are documented.  .  Other: None.      IMPRESSION:    1.  Left greater than right ovarian cysts with possible right corpus   luteum. Follow up pelvic ultrasound in 6 to 12 months can document   stability.  2.  Small lymph nodes fluid small amount of fluid surrounding the right   ovary may be physiologic, though could reflect sequelae of cyst rupture.  3.  Preserved vascular flow to the ovaries is documented by spectral   Doppler analysis.            CT Abdomen Pelvis W Contrast  Final Result by Deward Franky Conn, MD [phone])  CT ABDOMEN PELVIS W CONTRAST, 01/14/2024 4:54 AM    INDICATION: rlq pain, nausea, emesis   COMPARISON: CT abdomen pelvis 03/10/2013    TECHNIQUE: CT images of the abdomen and pelvis were obtained after   intravenous administration of iodinated contrast. Conventional axial   reconstructions and multiplanar reformatted images were submitted for   review.     FINDINGS:    . Lower Chest: Diffuse mosaicism and interstitial thickening.    . Liver: No suspicious focal findings.  . Gallbladder/Biliary: Mild intra and extrahepatic biliary ductal   dilatation, likely related to postcholecystectomy state.  SABRA Spleen: Unremarkable.  . Pancreas: Unremarkable.  . Adrenals: Unremarkable.  . Kidneys: Unremarkable.    . Peritoneum/Mesenteries/Extraperitoneum: No free air. No free fluid or   loculated  drainable collection. No pathologically enlarged lymph nodes.  . Gastrointestinal tract: Fat deposition within the gastric pyloric walls,   possibly reflecting sequelae of prior inflammation. No evidence of   obstruction. Normal appendix.    SABRA Ureters: Unremarkable.  . Bladder: Unremarkable.  . Reproductive System: Left ovarian cysts measuring up to 4.1 cm. Right   ovary is enlarged relative to 03/10/2013.    SABRA Vascular: Unremarkable.  . Musculoskeletal: No acute displaced fractures. Polyarticular    degenerative changes with diffuse idiopathic skeletal hyperostosis.   Moderate L5-S1 degenerative disc disease with lower lumbar disc bulges and   ligamentum flavum thickening contributing to likely moderate canal   stenosis. No aggressive focal bony lesions. Abdominal wall soft tissues   unremarkable.    IMPRESSION:  1.  Left ovarian cyst. Right ovary is enlarged relative to 03/10/2013. If   there is clinical concern for ovarian torsion, pelvic ultrasound can   further assess. Otherwise, if patient is postmenopausal, recommend pelvic   ultrasound in 6-12 months to assess stability of left ovarian cysts.  2.  Diffuse pulmonary mosaicism interstitial thickening, possibly related   to incomplete inspiration, though small airway disease and/or pulmonary   edema are possible.    CT Head WO Contrast  Final Result by Deward Franky Conn, MD [phone])  CT HEAD WITHOUT CONTRAST, 01/14/2024 4:53 AM    INDICATION: ams     COMPARISON: None    TECHNIQUE: Axial CT images of the brain from skull base to vertex,   including portions of the face and sinuses, were obtained without   contrast. Supplemental 2D reformatted images were generated and reviewed   as needed.    All CT scans at [facility]   Imaging are performed using radiation dose optimization techniques as   appropriate to a performed exam, including but not limited to one or more   of the following: automatic exposure control, adjustment of the mA and/or   kV according to patient size, use of iterative reconstruction technique.   In addition, our institution participates in a radiation dose monitoring   program to optimize patient radiation exposure.    FINDINGS:  Calvarium/skull base: No evidence of acute fracture or destructive lesion.   Mastoids and middle ears demonstrate no substantial mucosal disease. Right   frontal sinus osteoma. Edentulous maxilla.    Paranasal sinuses: No  air fluid levels.    Brain: No acute large vascular territory infarct. No mass effect. No   hydrocephalus. No acute hemorrhage. Partially empty sella.    IMPRESSION:  No acute intracranial abnormality. However, if there is clinical concern   for acute ischemia, MRI could further evaluate as CT is relatively   insensitive for the detection of an acute infarct in the first 24 to 48   hours.     CULTURES:  Results for orders placed or performed during the hospital encounter of 01/14/24 (from the past week)  Gonorrhea Screening Culture   Specimen: Cervicovaginal; Discharge  Result Value Ref Range   Gonorrhea Screening Culture No Neisseria gonorrheae isolated.    PATHOLOGY:  OTHER:   Surgeries/Procedures:     Other procedures performed:   Consults:   IP CONSULT TO HOSPITALIST IP CONSULT TO OB GYN   Follow-up Appointments:    No future appointments.       Electronically signed by: Signe Dominick Ramus, MD 01/17/2024 9:25 AM Time spent on discharge: 30 minutes.

## 2024-01-18 ENCOUNTER — Encounter: Payer: Self-pay | Admitting: Internal Medicine

## 2024-01-20 ENCOUNTER — Telehealth: Payer: Self-pay | Admitting: *Deleted

## 2024-01-20 ENCOUNTER — Other Ambulatory Visit: Payer: Self-pay | Admitting: Orthopaedic Surgery

## 2024-01-23 ENCOUNTER — Encounter (INDEPENDENT_AMBULATORY_CARE_PROVIDER_SITE_OTHER): Payer: Self-pay

## 2024-01-23 ENCOUNTER — Ambulatory Visit (INDEPENDENT_AMBULATORY_CARE_PROVIDER_SITE_OTHER): Payer: Self-pay | Admitting: Internal Medicine

## 2024-01-23 ENCOUNTER — Encounter (INDEPENDENT_AMBULATORY_CARE_PROVIDER_SITE_OTHER): Payer: Self-pay | Admitting: Internal Medicine

## 2024-01-29 ENCOUNTER — Encounter (INDEPENDENT_AMBULATORY_CARE_PROVIDER_SITE_OTHER): Payer: Self-pay | Admitting: Internal Medicine

## 2024-01-29 ENCOUNTER — Ambulatory Visit (INDEPENDENT_AMBULATORY_CARE_PROVIDER_SITE_OTHER): Admitting: Internal Medicine

## 2024-01-29 VITALS — BP 130/86 | HR 100 | Temp 98.4°F | Ht 65.0 in | Wt 309.0 lb

## 2024-01-29 DIAGNOSIS — D509 Iron deficiency anemia, unspecified: Secondary | ICD-10-CM | POA: Diagnosis not present

## 2024-01-29 DIAGNOSIS — E66813 Obesity, class 3: Secondary | ICD-10-CM

## 2024-01-29 DIAGNOSIS — G4733 Obstructive sleep apnea (adult) (pediatric): Secondary | ICD-10-CM

## 2024-01-29 DIAGNOSIS — R638 Other symptoms and signs concerning food and fluid intake: Secondary | ICD-10-CM

## 2024-01-29 DIAGNOSIS — R7303 Prediabetes: Secondary | ICD-10-CM | POA: Diagnosis not present

## 2024-01-29 DIAGNOSIS — Z6841 Body Mass Index (BMI) 40.0 and over, adult: Secondary | ICD-10-CM

## 2024-01-29 DIAGNOSIS — E559 Vitamin D deficiency, unspecified: Secondary | ICD-10-CM

## 2024-01-29 MED ORDER — PHENTERMINE HCL 37.5 MG PO TABS: 37.5000 mg | ORAL_TABLET | Freq: Every day | ORAL | 0 refills | Status: DC

## 2024-01-29 NOTE — Assessment & Plan Note (Addendum)
 Untreated she has been educated on the bidirectional relationship of sleep apnea and weight.  Patient is considering restarting PAP therapy

## 2024-01-29 NOTE — Assessment & Plan Note (Signed)
 Again detected on recent labs at [facility].  Patient will resume high-dose vitamin D  supplementation provided by PCP.

## 2024-01-29 NOTE — Assessment & Plan Note (Addendum)
 Her last A1c was 5.9 at [facility] and improved from previous 6.3 but patient is at risk for progression.  She had been on metformin  in the past but medication was discontinued because of increase in bicarbonate while also taking topiramate .  Her insurance no longer covers GLP-1.  Continue with nutritional and behavioral strategies

## 2024-01-29 NOTE — Assessment & Plan Note (Signed)
 Iron deficiency anemia with hemoglobin at 9.8. Ferritin level was 7, indicating low iron storage. Differential includes malabsorption or occult blood loss. Elevated CRP suggests inflammation.  Reports having previous polyps on colonoscopies - Follow up with PCP regarding anemia and elevated CRP - Take vitamin C with first dose of iron supplement to enhance absorption - Continue ferrous sulfate  supplementation twice daily -Monitor for any signs of active bleeding which were discussed

## 2024-01-29 NOTE — Assessment & Plan Note (Addendum)
 Continued weight loss with a 3-pound reduction since the last visit. Adherence to a 1500 calorie nutrition plan and regular exercise reported. Good appetite control reported with phentermine . No insomnia, palpitations, or anxiety reported. - Continue phentermine  at current dose - Maintain 1500 calorie nutrition plan - Continue regular exercise regimen - If she does not lose 5% of body weight after 6 months of phentermine  medication will be discontinued.

## 2024-01-29 NOTE — Assessment & Plan Note (Signed)
 Improved on phentermine . She has increased orexigenic signaling, impaired satiety and inhibitory control. This is secondary to an abnormal energy regulation system and pathological neurohormonal pathways characteristic of excess adiposity.  In addition to nutritional and behavioral strategies she benefits from pharmacotherapy.

## 2024-01-29 NOTE — Progress Notes (Signed)
 Office: [phone]  /  Fax: [phone]  Weight Summary and Body Composition Analysis (BIA)  Vitals Temp: 98.4 F (36.9 C) BP: 130/86 Pulse Rate: 100 SpO2: 98 %   Anthropometric Measurements Height: 5' 5 (1.651 m) Weight: (!) 309 lb (140.2 kg) BMI (Calculated): 51.42 Weight at Last Visit: 312 lb Weight Lost Since Last Visit: 3 lb Weight Gained Since Last Visit: 0 lb Starting Weight: 321 lb Total Weight Loss (lbs): 12 lb (5.443 kg) Peak Weight: 321 lb   Body Composition  Body Fat %: 53.6 % Fat Mass (lbs): 166 lbs Muscle Mass (lbs): 136.6 lbs Total Body Water (lbs): 104.8 lbs Visceral Fat Rating : 20    RMR: 2016  Today's Visit #: 23  Starting Date: 03/14/22   Subjective   Chief Complaint: Obesity  Interval History Discussed the use of AI scribe software for clinical note transcription with the patient, who gave verbal consent to proceed.  History of Present Illness Jenna [name] is a 53 year old female who presents for medical weight management.  Since the last visit, she has lost three pounds. She follows a 1500 calorie nutrition plan and exercises three to five days a week for sixteen minutes each session. No insomnia, palpitations, or anxiety.  She presented to [facility] earlier this month due to symptom complex and was found to have iron deficiency and some electrolyte abnormalities.  She has a history of iron deficiency anemia, for which she received two iron infusions during a four-day hospital stay. Her hemoglobin was 9 at the time of admission.  Previous hemoglobin in the 12 range.  She is currently taking ferrous sulfate  twice daily and takes vitamin C with her iron supplement to aid absorption. No blood in stools.  She denies having her menses or signs or symptoms of gastrointestinal bleeding.  She denies taking anti-inflammatories.  She has an appointment upcoming with her PCP  Her vitamin D  levels were low.  She is prediabetic with an A1c of  5.9 and has a history of low magnesium levels, attributed to Topamax  use. She takes magnesium supplements.  She has a history of ovarian cysts, with both ovaries previously noted to be inflamed, particularly the right one. She experienced a cessation of her menstrual periods for almost eight months, followed by a full period a month before her recent health issues.  She has a history of ulcers and polyps in the colon, with a large polyp removed in two procedures. She has undergone two colonoscopies.     Challenges affecting patient progress: medical comorbidities.    Pharmacotherapy for weight management: She is currently taking Phentermine  (longterm use, off-label, single agent)  with adequate clinical response  and without side effects..   Assessment and Plan   Treatment Plan For Obesity:  Recommended Dietary Goals  Jenna [name] is currently in the action stage of change. As such, her goal is to continue weight management plan. She has agreed to: incorporate prepackaged healthy meals for convenience, incorporate 1-2 meal replacements a day for convenience , and continue current plan  Behavioral Health and Counseling  We discussed the following behavioral modification strategies today: work on meal planning and preparation, work on tracking and journaling calories using tracking application, continue to work on maintaining a reduced calorie state, getting the recommended amount of protein, incorporating whole foods, making healthy choices, staying well hydrated and practicing mindfulness when eating., and increase protein intake, fibrous foods (25 grams per day for women, 30 grams for men) and water to  improve satiety and decrease hunger signals. .  Additional education and resources provided today: Handout on traveling and holiday eating strategies  Recommended Physical Activity Goals  Jenna [name] has been advised to work up to 150 minutes of moderate intensity aerobic activity a week and  strengthening exercises 2-3 times per week for cardiovascular health, weight loss maintenance and preservation of muscle mass.  She has agreed to :  Think about enjoyable ways to increase daily physical activity and overcoming barriers to exercise, Increase physical activity in their day and reduce sedentary time (increase NEAT)., Increase volume of physical activity to a goal of 240 minutes a week, and Combine aerobic and strengthening exercises for efficiency and improved cardiometabolic health.  Medical Interventions and Pharmacotherapy  We discussed various medication options to help Jenna [name] with her weight loss efforts and we both agreed to : Adequate clinical response to anti-obesity medication, continue current regimen and Patient was counseled on the importance of maintaining healthy lifestyle habits, including balanced nutrition, regular physical activity, and behavioral modifications, while taking antiobesity medication.  Patient verbalized understanding that medication is an adjunct to, not a replacement for, lifestyle changes and that the long-term success and weight maintenance depend on continued adherence to these strategies.  We have discussed that long-term use of phentermine  for weight management. Patient informed that use beyond 12 weeks is off-label. Reviewed evidence supporting extended use in select patients with regular monitoring. Risks discussed: insomnia, increased heart rate, elevated BP, and potential for dependence. Alternatives include lifestyle interventions, FDA-approved medications (e.g., Wegovy , Zepbound, Qsymia, Contrave)- these are either not covered, cost prohibitive or contraindicated-,  and bariatric surgery. Medical history reviewed for contraindications.  Patient verbalized understanding and agreed to continue phentermine  with close monitoring. Plan includes regular follow-up every 4-6 weeks to assess efficacy, side effects, and vitals.  Medication  safety: Reviewed common side effects of phentermine , no side effects reported.  Reviewed vitals signs and they are stable Reviewed for contraindications, none present Reviewed state registry for controlled substances and no other controlled substances found Medication will be discontinued if less than 5% weight loss in 6 months Discussed safety data and off label use for long-term treatment of obesity.   Associated Conditions Impacted by Obesity Treatment  Assessment & Plan Iron deficiency anemia, unspecified iron deficiency anemia type Iron deficiency anemia with hemoglobin at 9.8. Ferritin level was 7, indicating low iron storage. Differential includes malabsorption or occult blood loss. Elevated CRP suggests inflammation.  Reports having previous polyps on colonoscopies - Follow up with PCP regarding anemia and elevated CRP - Take vitamin C with first dose of iron supplement to enhance absorption - Continue ferrous sulfate  supplementation twice daily -Monitor for any signs of active bleeding which were discussed Class 3 severe obesity with serious comorbidity and body mass index (BMI) of 50.0 to 59.9 in adult, unspecified obesity type (HCC) Continued weight loss with a 3-pound reduction since the last visit. Adherence to a 1500 calorie nutrition plan and regular exercise reported. Good appetite control reported with phentermine . No insomnia, palpitations, or anxiety reported. - Continue phentermine  at current dose - Maintain 1500 calorie nutrition plan - Continue regular exercise regimen - If she does not lose 5% of body weight after 6 months of phentermine  medication will be discontinued. OSA (obstructive sleep apnea) Untreated she has been educated on the bidirectional relationship of sleep apnea and weight.  Patient is considering restarting PAP therapy  Prediabetes Her last A1c was 5.9 at [facility] and improved from previous 6.3 but patient  is at risk for progression.  She had been on  metformin  in the past but medication was discontinued because of increase in bicarbonate while also taking topiramate .  Her insurance no longer covers GLP-1.  Continue with nutritional and behavioral strategies Abnormal food appetite Improved on phentermine . She has increased orexigenic signaling, impaired satiety and inhibitory control. This is secondary to an abnormal energy regulation system and pathological neurohormonal pathways characteristic of excess adiposity.  In addition to nutritional and behavioral strategies she benefits from pharmacotherapy.  Vitamin D  deficiency Again detected on recent labs at [facility].  Patient will resume high-dose vitamin D  supplementation provided by PCP. Hypomagnesemia Likely secondary to chronic use of topiramate .  Patient advised to start taking a magnesium supplement over-the-counter        Objective   Physical Exam:  Blood pressure 130/86, pulse 100, temperature 98.4 F (36.9 C), height 5' 5 (1.651 m), weight (!) 309 lb (140.2 kg), last menstrual period 07/30/2023, SpO2 98%. Body mass index is 51.42 kg/m.  General: She is overweight, cooperative, alert, well developed, and in no acute distress. PSYCH: Has normal mood, affect and thought process.   HEENT: EOMI, sclerae are anicteric. Lungs: Normal breathing effort, no conversational dyspnea. Extremities: No edema.  Neurologic: No gross sensory or motor deficits. No tremors or fasciculations noted.    Diagnostic Data Reviewed:  BMET    Component Value Date/Time   NA 140 07/23/2022 1213   K 4.7 07/23/2022 1213   CL 108 (H) 07/23/2022 1213   CO2 19 (L) 07/23/2022 1213   GLUCOSE 105 (H) 07/23/2022 1213   GLUCOSE 118 (H) 12/20/2021 1430   BUN 8 07/23/2022 1213   CREATININE 0.89 07/23/2022 1213   CALCIUM 9.0 07/23/2022 1213   GFRNONAA 55 (L) 12/20/2021 1430   GFRAA >60 09/25/2019 1548   Lab Results  Component Value Date   HGBA1C 5.8 04/04/2023   HGBA1C 5.6 12/03/2017   Lab  Results  Component Value Date   INSULIN  41.6 (H) 03/14/2022   INSULIN  7.4 12/08/2018   Lab Results  Component Value Date   TSH 1.655 12/20/2021   CBC    Component Value Date/Time   WBC 8.1 12/20/2021 1430   RBC 4.57 12/20/2021 1430   HGB 12.5 12/20/2021 1430   HGB 12.5 01/23/2021 1525   HCT 38.9 12/20/2021 1430   HCT 37.4 01/23/2021 1525   PLT 333 12/20/2021 1430   PLT 284 01/23/2021 1525   MCV 85.1 12/20/2021 1430   MCV 88 01/23/2021 1525   MCH 27.4 12/20/2021 1430   MCHC 32.1 12/20/2021 1430   RDW 14.6 12/20/2021 1430   RDW 15.1 01/23/2021 1525   Iron Studies    Component Value Date/Time   IRON 53 04/02/2019 1510   TIBC 360 04/02/2019 1510   FERRITIN 26 04/02/2019 1510   IRONPCTSAT 15 04/02/2019 1510   Lipid Panel     Component Value Date/Time   CHOL 140 03/14/2022 1030   TRIG 68 03/14/2022 1030   HDL 46 03/14/2022 1030   CHOLHDL 3.8 10/06/2020 1458   LDLCALC 80 03/14/2022 1030   Hepatic Function Panel     Component Value Date/Time   PROT 6.8 07/23/2022 1213   ALBUMIN 3.9 07/23/2022 1213   AST 24 07/23/2022 1213   ALT 28 07/23/2022 1213   ALKPHOS 81 07/23/2022 1213   BILITOT 0.4 07/23/2022 1213      Component Value Date/Time   TSH 1.655 12/20/2021 1430   TSH 1.430 01/23/2021 1525   Nutritional  Lab Results  Component Value Date   VD25OH 28.3 (L) 07/23/2022   VD25OH 14.6 (L) 03/14/2022   VD25OH 14.8 (L) 01/23/2021    Medications: Outpatient Encounter Medications as of 01/29/2024  Medication Sig   diclofenac  (VOLTAREN ) 75 MG EC tablet Take 1 tablet (75 mg total) by mouth 2 (two) times daily as needed.   DULoxetine  (CYMBALTA ) 30 MG capsule Take 1 capsule (30 mg total) by mouth daily.   ferrous sulfate  325 (65 FE) MG tablet Take 325 mg by mouth every Wednesday.   furosemide  (LASIX ) 20 MG tablet Take 1 tablet (20 mg total) by mouth as needed for edema or fluid.   gabapentin  (NEURONTIN ) 300 MG capsule Take 1 capsule (300 mg total) by mouth daily as  needed.   hydrOXYzine  (ATARAX ) 25 MG tablet Take 1-2 tablets (25-50 mg total) by mouth every 6 (six) to 8 (eight) hours as needed for itching.   rOPINIRole  (REQUIP ) 0.5 MG tablet TAKE 1 TABLET BY MOUTH AT  BEDTIME   tiZANidine  (ZANAFLEX ) 4 MG tablet TAKE 1 TABLET BY MOUTH EVERY 8  HOURS AS NEEDED FOR MUSCLE  SPASM(S)   topiramate  (TOPAMAX ) 25 MG tablet Take 3 tablets (75 mg total) by mouth at bedtime.   Vitamin D , Ergocalciferol , (DRISDOL ) 1.25 MG (50000 UNIT) CAPS capsule Take 1 capsule (50,000 Units total) by mouth every 7 (seven) days.   [DISCONTINUED] phentermine  (ADIPEX-P ) 37.5 MG tablet Take 1 tablet (37.5 mg total) by mouth daily before breakfast.   ondansetron  (ZOFRAN ) 4 MG tablet Take 1 tablet (4 mg total) by mouth every 8 (eight) hours as needed for nausea or vomiting. (Patient not taking: Reported on 01/29/2024)   phentermine  (ADIPEX-P ) 37.5 MG tablet Take 1 tablet (37.5 mg total) by mouth daily before breakfast.   No facility-administered encounter medications on file as of 01/29/2024.     Follow-Up   Return in about 4 weeks (around 02/26/2024) for For Weight Mangement with Dr. Francyne.SABRA She was informed of the importance of frequent follow up visits to maximize her success with intensive lifestyle modifications for her multiple health conditions.  Attestation Statement   Reviewed by clinician on day of visit: allergies, medications, problem list, medical history, surgical history, family history, social history, and previous encounter notes.     Lucas Francyne, MD

## 2024-02-01 LAB — GLUCOSE, POCT (MANUAL RESULT ENTRY): Glucose Fasting, POC: 107 mg/dL — AB (ref 70–99)

## 2024-02-03 NOTE — Progress Notes (Signed)
 SDOH completed on 01/14/24 no changes

## 2024-02-04 ENCOUNTER — Ambulatory Visit: Admitting: Internal Medicine

## 2024-02-04 ENCOUNTER — Ambulatory Visit: Attending: Internal Medicine | Admitting: Internal Medicine

## 2024-02-04 ENCOUNTER — Encounter: Payer: Self-pay | Admitting: Internal Medicine

## 2024-02-04 ENCOUNTER — Other Ambulatory Visit: Payer: Self-pay

## 2024-02-04 VITALS — BP 111/75 | HR 89 | Temp 98.3°F | Ht 65.0 in | Wt 315.0 lb

## 2024-02-04 DIAGNOSIS — M170 Bilateral primary osteoarthritis of knee: Secondary | ICD-10-CM

## 2024-02-04 DIAGNOSIS — Z09 Encounter for follow-up examination after completed treatment for conditions other than malignant neoplasm: Secondary | ICD-10-CM

## 2024-02-04 DIAGNOSIS — N83201 Unspecified ovarian cyst, right side: Secondary | ICD-10-CM

## 2024-02-04 DIAGNOSIS — G8929 Other chronic pain: Secondary | ICD-10-CM

## 2024-02-04 DIAGNOSIS — D509 Iron deficiency anemia, unspecified: Secondary | ICD-10-CM

## 2024-02-04 DIAGNOSIS — G4761 Periodic limb movement disorder: Secondary | ICD-10-CM

## 2024-02-04 DIAGNOSIS — E559 Vitamin D deficiency, unspecified: Secondary | ICD-10-CM

## 2024-02-04 DIAGNOSIS — M545 Low back pain, unspecified: Secondary | ICD-10-CM

## 2024-02-04 DIAGNOSIS — N83202 Unspecified ovarian cyst, left side: Secondary | ICD-10-CM

## 2024-02-04 DIAGNOSIS — N951 Menopausal and female climacteric states: Secondary | ICD-10-CM

## 2024-02-04 MED ORDER — VITAMIN D (ERGOCALCIFEROL) 1.25 MG (50000 UNIT) PO CAPS
50000.0000 [IU] | ORAL_CAPSULE | ORAL | 1 refills | Status: AC
Start: 2024-02-04 — End: ?
  Filled 2024-02-04: qty 12, 84d supply, fill #0

## 2024-02-04 NOTE — Patient Instructions (Signed)
  VISIT SUMMARY: Today, we discussed your recent hospitalization for abdominal pain, dizziness, and anemia. We reviewed your current medications and made some adjustments to better manage your symptoms and overall health. We also discussed follow-up plans for your ovarian cysts, anemia, and other ongoing health issues.  YOUR PLAN: -HOSPITAL FOLLOW UP: You were discharged with oral iron supplements and Naprosyn  for pain, which has now resolved. We have stopped the Naprosyn  due to the resolution of pain and potential interaction with diclofenac . Please continue taking your oral iron supplements as prescribed.  -IRON DEFICIENCY ANEMIA: Iron deficiency anemia means you have a lower than normal number of red blood cells due to a lack of iron. We have ordered repeat blood tests to check your current iron levels. Continue taking your oral iron supplements as prescribed and take them with orange juice to help your body absorb the iron better.  -LEFT AND RIGHT OVARIAN CYSTS: Ovarian cysts are fluid-filled sacs on your ovaries. Your recent imaging showed cysts on both ovaries, with a possible rupture on the right side. We have referred you to a gynecologist for further evaluation and will plan for a repeat pelvic ultrasound in 6 months to monitor the cysts.  -CHRONIC LOWER BACK PAIN: Chronic lower back pain is long-term pain in your lower back. Gabapentin  caused drowsiness when taken three times a day, so you should continue taking it once daily as needed. We have stopped the three times a day prescription and encourage you to try Cymbalta  30 mg after stopping gabapentin .  -KNEE OSTEOARTHRITIS: Osteoarthritis is a type of arthritis that occurs when the protective cartilage on the ends of your bones wears down over time. Continue managing your knee pain with gabapentin  as needed.  -VITAMIN D  DEFICIENCY: Vitamin D  deficiency means you have lower than normal levels of vitamin D , which is important for bone health.  We have prescribed vitamin D  supplements and sent a refill to your pharmacy.  -RESTLESS LEGS SYNDROME: Restless legs syndrome is a condition that causes an uncontrollable urge to move your legs, usually due to discomfort. Improving your iron levels should help with this condition, so we will monitor for improvement as you continue your iron supplements.  -PERIMENOPAUSAL STATE: Perimenopause is the transition period before menopause when your menstrual cycles become irregular. You have not had a menstrual cycle for 8-9 months, with a recent episode in October. Please track your menstrual cycles and monitor for any abnormal bleeding. We discussed the importance of monitoring for postmenopausal bleeding.  INSTRUCTIONS: Please follow up with the gynecologist for further evaluation of your ovarian cysts. Continue taking your prescribed medications as directed. We will repeat your blood count and iron studies to monitor your anemia. Plan for a repeat pelvic ultrasound in 6 months. Track your menstrual cycles and report any abnormal blee ding.                      Contains text generated by [employer].                                 Contains text generated by [employer].

## 2024-02-04 NOTE — Progress Notes (Signed)
 Patient ID: Emryn Flanery, female    DOB: August 25, 1970  MRN: [id number]  CC: Hospitalization Follow-up (Hospitalization f/u. /Discuss the inflammation markers in recent lab work & iron /Discuss cysts on ovaries/No to flu vax)   Subjective: Marlis Oldaker is a 53 y.o. female who presents for hosp f/u. Her concerns today include:  Patient with history of morbid obesity on Phentermine  through [facility], preDM, OA of the knees, chronic lower back pain.  Depression (on Cymbalta  for LBP and mood), vitamin D  deficiency   Discussed the use of AI scribe software for clinical note transcription with the patient, who gave verbal consent to proceed.  History of Present Illness Reverie Vaquera is a 53 year old female who presents for follow-up after hospitalization for right lower quadrant abdominal pain, dizziness, and anemia at [facility].  She was hospitalized from November 4 to January 17, 2024, for right lower quadrant abdominal pain, generalized weakness, dizziness, nausea, and vomiting. During hospitalization, she was found to be anemic with a hemoglobin level of 10, down from 12 two years prior. Iron studies c/w IDA: iron 28/ ferritin 12/7% and TIBC 396. She received two iron infusions and was discharged on oral iron supplements, 325 mg, two tablets, three times a week. Her hemoglobin was 9.1 at discharge. Reports taking the oral iron supplement as prescribed. She experienced a menstrual cycle in late October, which was heavy with blood clots, after not having a cycle for eight to nine months. She has not had another cycle since then. She reports heavy menstrual cycles in the past with clots, lasting four to five days, requiring frequent changes of feminine products. No blood in stools or black stools. She is up to date with her colon cancer screening.  A pelvic ultrasound and CT abdomen/pelvis were performed to evaluate her abdominal pain. The ultrasound showed left ovarian cysts  largest measuring 4.4 cm and RT ovarian cysts largest measuring 2.1x1.9x1.6 cm and possible right corpus luteum. There was also a small amount of fluid around the right ovary, possibly due to a ruptured cyst. The CT scan showed left ovarian cysts and an enlarged right ovary compared to a previous study from March 10, 2013.  She was discharged with a prescription for Naprosyn  500 mg twice a day for 14 days, but she did not take it as the pain resolved. She is on diclofenac  as needed for pain previously prescribed by her orthopedics Dr. Vernetta.  On hospital discharge, she was also prescribed gabapentin  to take 3 times a day and lieu of Cymbalta .  However patient states the gabapentin  is too sedating.  She previously was taking it once a day as needed.  On last visit with me, we had decreased Cymbalta  from 60 mg daily to 30 mg daily because the higher dose was making her feel loopy.  However she found it was beneficial for depression and MSK pain.  She did not pick up the lower dose but has not started taking it as yet.  She was prescribed omeprazole but did not take it. Her vitamin D  level was low at 13.5, and she requires a refill for her vitamin D  supplement, which she takes once weekly but has been off it for a while.  Physician she had sed rate that was mildly elevated at 38 and C-reactive protein of 49.8      Patient Active Problem List   Diagnosis Date Noted   Iron deficiency anemia 01/29/2024   History of colonic polyps  06/20/2023   Adenomatous polyp of ascending colon 06/20/2023   Weight gain due to medication 05/27/2023   Elevated blood pressure reading without diagnosis of hypertension 04/02/2023   Abnormal food appetite 04/02/2023   OSA (obstructive sleep apnea) 11/08/2022   Bilateral leg edema 07/23/2022   Benign neoplasm of ascending colon 07/19/2022   Acute kidney injury 03/28/2022   Prediabetes 03/28/2022   Other spondylosis with radiculopathy, cervical region 10/03/2021    Fecal occult blood test positive 07/15/2021   Periodic limb movement 07/11/2021   Loud snoring 01/23/2021   Severe obesity (BMI >= 40) (HCC) 02/08/2020   Insulin  resistance 07/28/2019   Depression 07/28/2019   Vitamin D  deficiency 12/09/2018   Class 3 severe obesity with serious comorbidity and body mass index (BMI) of 50.0 to 59.9 in adult, unspecified obesity type 01/17/2017   Low back pain 05/17/2016   Unilateral primary osteoarthritis, left knee 05/08/2016   Primary osteoarthritis of both knees 06/27/2015   Closed right fibular fracture 08/10/2014     Current Outpatient Medications on File Prior to Visit  Medication Sig Dispense Refill   diclofenac  (VOLTAREN ) 75 MG EC tablet Take 1 tablet (75 mg total) by mouth 2 (two) times daily as needed. 60 tablet 1   DULoxetine  (CYMBALTA ) 30 MG capsule Take 1 capsule (30 mg total) by mouth daily. 30 capsule 3   ferrous sulfate  325 (65 FE) MG tablet Take 650 mg by mouth. 2 tabs PO 3x/wk     gabapentin  (NEURONTIN ) 300 MG capsule Take 1 capsule (300 mg total) by mouth daily as needed. 60 capsule 3   hydrOXYzine  (ATARAX ) 25 MG tablet Take 1-2 tablets (25-50 mg total) by mouth every 6 (six) to 8 (eight) hours as needed for itching. 30 tablet 0   phentermine  (ADIPEX-P ) 37.5 MG tablet Take 1 tablet (37.5 mg total) by mouth daily before breakfast. 30 tablet 0   rOPINIRole  (REQUIP ) 0.5 MG tablet TAKE 1 TABLET BY MOUTH AT  BEDTIME 90 tablet 3   tiZANidine  (ZANAFLEX ) 4 MG tablet TAKE 1 TABLET BY MOUTH EVERY 8  HOURS AS NEEDED FOR MUSCLE  SPASM(S) 60 tablet 0   topiramate  (TOPAMAX ) 25 MG tablet Take 3 tablets (75 mg total) by mouth at bedtime. 270 tablet 3   No current facility-administered medications on file prior to visit.    Allergies  Allergen Reactions   Diflucan  [Fluconazole ] Hives   Metformin  And Related Other (See Comments)    Decreased bicarbonate    Social History   Socioeconomic History   Marital status: Single    Spouse name: Not on  file   Number of children: Not on file   Years of education: Not on file   Highest education level: GED or equivalent  Occupational History   Occupation: Stay at home, disabled  Tobacco Use   Smoking status: Former    Current packs/day: 0.00    Types: Cigarettes    Quit date: 10/10/2021    Years since quitting: 2.3   Smokeless tobacco: Never  Vaping Use   Vaping status: Never Used  Substance and Sexual Activity   Alcohol use: No    Alcohol/week: 0.0 standard drinks of alcohol   Drug use: No   Sexual activity: Not Currently  Other Topics Concern   Not on file  Social History Narrative   Not on file   Social Drivers of Health   Financial Resource Strain: Low Risk  (02/04/2024)   Overall Financial Resource Strain (CARDIA)    Difficulty of  Paying Living Expenses: Not hard at all  Food Insecurity: No Food Insecurity (02/04/2024)   Hunger Vital Sign    Worried About Running Out of Food in the Last Year: Never true    Ran Out of Food in the Last Year: Never true  Transportation Needs: No Transportation Needs (02/04/2024)   PRAPARE - Administrator, Civil Service (Medical): No    Lack of Transportation (Non-Medical): No  Physical Activity: Insufficiently Active (02/04/2024)   Exercise Vital Sign    Days of Exercise per Week: 3 days    Minutes of Exercise per Session: 20 min  Stress: No Stress Concern Present (02/04/2024)   [employer] of Occupational Health - Occupational Stress Questionnaire    Feeling of Stress: Not at all  Social Connections: Unknown (01/09/2024)   Social Connection and Isolation Panel    Frequency of Communication with Friends and Family: More than three times a week    Frequency of Social Gatherings with Friends and Family: Three times a week    Attends Religious Services: More than 4 times per year    Active Member of Clubs or Organizations: Yes    Attends Banker Meetings: 1 to 4 times per year    Marital Status: Patient  declined  Intimate Partner Violence: Not At Risk (02/04/2024)   Humiliation, Afraid, Rape, and Kick questionnaire    Fear of Current or Ex-Partner: No    Emotionally Abused: No    Physically Abused: No    Sexually Abused: No    Family History  Problem Relation Age of Onset   Colon polyps Mother    Diabetes Mother    Hypertension Mother    Hypertension Father    Breast cancer Neg Hx    Colon cancer Neg Hx    Esophageal cancer Neg Hx    Rectal cancer Neg Hx    Stomach cancer Neg Hx     Past Surgical History:  Procedure Laterality Date   CHOLECYSTECTOMY     COLONOSCOPY     COLONOSCOPY WITH PROPOFOL  N/A 07/19/2022   Procedure: COLONOSCOPY WITH PROPOFOL ;  Surgeon: Stacia Glendia BRAVO, MD;  Location: [facility] ENDOSCOPY;  Service: Gastroenterology;  Laterality: N/A;   COLONOSCOPY WITH PROPOFOL  N/A 10/25/2022   Procedure: COLONOSCOPY WITH PROPOFOL ;  Surgeon: Mansouraty, Aloha Raddle., MD;  Location: [facility] ENDOSCOPY;  Service: Gastroenterology;  Laterality: N/A;   COLONOSCOPY WITH PROPOFOL  N/A 06/20/2023   Procedure: COLONOSCOPY WITH PROPOFOL ;  Surgeon: Mansouraty, Aloha Raddle., MD;  Location: [facility] ENDOSCOPY;  Service: Gastroenterology;  Laterality: N/A;   ENDOSCOPIC MUCOSAL RESECTION N/A 10/25/2022   Procedure: ENDOSCOPIC MUCOSAL RESECTION;  Surgeon: Wilhelmenia Aloha Raddle., MD;  Location: [facility] ENDOSCOPY;  Service: Gastroenterology;  Laterality: N/A;   HEMOSTASIS CLIP PLACEMENT  10/25/2022   Procedure: HEMOSTASIS CLIP PLACEMENT;  Surgeon: Wilhelmenia Aloha Raddle., MD;  Location: [facility] ENDOSCOPY;  Service: Gastroenterology;;   HOT HEMOSTASIS N/A 10/25/2022   Procedure: HOT HEMOSTASIS (ARGON PLASMA COAGULATION/BICAP);  Surgeon: Wilhelmenia Aloha Raddle., MD;  Location: [facility];  Service: Gastroenterology;  Laterality: N/A;   POLYPECTOMY  07/19/2022   Procedure: POLYPECTOMY;  Surgeon: Stacia Glendia BRAVO, MD;  Location: [facility] ENDOSCOPY;  Service: Gastroenterology;;   POLYPECTOMY  06/20/2023   Procedure:  POLYPECTOMY, INTESTINE;  Surgeon: Wilhelmenia Aloha Raddle., MD;  Location: [facility];  Service: Gastroenterology;;   SUBMUCOSAL INJECTION  06/20/2023   Procedure: INJECTION, SUBMUCOSAL;  Surgeon: Wilhelmenia Aloha Raddle., MD;  Location: [facility];  Service: Gastroenterology;;   SUBMUCOSAL LIFTING INJECTION  07/19/2022  Procedure: SUBMUCOSAL LIFTING INJECTION;  Surgeon: Stacia Glendia BRAVO, MD;  Location: [facility] ENDOSCOPY;  Service: Gastroenterology;;   SUBMUCOSAL LIFTING INJECTION  10/25/2022   Procedure: SUBMUCOSAL LIFTING INJECTION;  Surgeon: Wilhelmenia Aloha Raddle., MD;  Location: [facility] ENDOSCOPY;  Service: Gastroenterology;;   SUBMUCOSAL TATTOO INJECTION  10/25/2022   Procedure: SUBMUCOSAL TATTOO INJECTION;  Surgeon: Wilhelmenia Aloha Raddle., MD;  Location: [facility] ENDOSCOPY;  Service: Gastroenterology;;   TUBAL LIGATION      ROS: Review of Systems Negative except as stated above  PHYSICAL EXAM: BP 111/75 (BP Location: Left Arm, Patient Position: Sitting, Cuff Size: Large)   Pulse 89   Temp 98.3 F (36.8 C) (Oral)   Ht 5' 5 (1.651 m)   Wt (!) 315 lb (142.9 kg)   LMP 07/30/2023   SpO2 99%   BMI 52.42 kg/m   Wt Readings from Last 3 Encounters:  02/04/24 (!) 315 lb (142.9 kg)  01/29/24 (!) 309 lb (140.2 kg)  01/09/24 (!) 318 lb (144.2 kg)    Physical Exam  General appearance - alert, well appearing, obese middle age AAF and in no distress Mental status - normal mood, behavior, speech, dress, motor activity, and thought processes Eyes - slightly pale conjunctiva Neck - supple, no significant adenopathy Chest - clear to auscultation, no wheezes, rales or rhonchi, symmetric air entry Heart - normal rate, regular rhythm, normal S1, S2, no murmurs, rubs, clicks or gallops Extremities - no LE edema      Latest Ref Rng & Units 07/23/2022   12:13 PM 03/14/2022   10:30 AM 12/20/2021    2:30 PM  CMP  Glucose 70 - 99 mg/dL 894  899  881   BUN 6 - 24 mg/dL 8  10  10    Creatinine 0.57 -  1.00 mg/dL 9.10  9.13  8.78   Sodium 134 - 144 mmol/L 140  139  140   Potassium 3.5 - 5.2 mmol/L 4.7  4.7  4.0   Chloride 96 - 106 mmol/L 108  106  109   CO2 20 - 29 mmol/L 19  20  21    Calcium 8.7 - 10.2 mg/dL 9.0  9.6  9.8   Total Protein 6.0 - 8.5 g/dL 6.8  7.0  7.9   Total Bilirubin 0.0 - 1.2 mg/dL 0.4  0.4  0.5   Alkaline Phos 44 - 121 IU/L 81  78  72   AST 0 - 40 IU/L 24  17  28    ALT 0 - 32 IU/L 28  17  24     Lipid Panel     Component Value Date/Time   CHOL 140 03/14/2022 1030   TRIG 68 03/14/2022 1030   HDL 46 03/14/2022 1030   CHOLHDL 3.8 10/06/2020 1458   LDLCALC 80 03/14/2022 1030    CBC    Component Value Date/Time   WBC 8.1 12/20/2021 1430   RBC 4.57 12/20/2021 1430   HGB 12.5 12/20/2021 1430   HGB 12.5 01/23/2021 1525   HCT 38.9 12/20/2021 1430   HCT 37.4 01/23/2021 1525   PLT 333 12/20/2021 1430   PLT 284 01/23/2021 1525   MCV 85.1 12/20/2021 1430   MCV 88 01/23/2021 1525   MCH 27.4 12/20/2021 1430   MCHC 32.1 12/20/2021 1430   RDW 14.6 12/20/2021 1430   RDW 15.1 01/23/2021 1525   LYMPHSABS 3.8 12/20/2021 1430   LYMPHSABS 2.7 10/06/2020 1458   MONOABS 0.6 12/20/2021 1430   EOSABS 0.2 12/20/2021 1430   EOSABS 0.3 10/06/2020  1458   BASOSABS 0.1 12/20/2021 1430   BASOSABS 0.0 10/06/2020 1458    ASSESSMENT AND PLAN: 1. Hospital discharge follow-up (Primary)   2. Cysts of both ovaries Pelvic imaging showed left greater than right ovarian cysts with possible rupture of a cyst.  Pain resolved, suggesting possible resolution of cyst rupture. - Referred to gynecology for further evaluation and management. - Plan for repeat pelvic ultrasound in 6 months to assess stability of ovarian cysts. - Ambulatory referral to Gynecology  3. Iron deficiency anemia, unspecified iron deficiency anemia type Likely due to hx of heavy menses when menses were regular. Prior to hosp, last CBC was 2 yrs ago. Continue iron supplement for now.  We will recheck CBC and iron  studies today. - CBC - Iron, TIBC and Ferritin Panel  4. Vitamin D  deficiency - Vitamin D , Ergocalciferol , (DRISDOL ) 1.25 MG (50000 UNIT) CAPS capsule; Take 1 capsule (50,000 Units total) by mouth every 7 (seven) days.  Dispense: 12 capsule; Refill: 1  5. Perimenopausal Advised patient to keep a calendar of cycles.  After she has not had a menstrual cycle for 1 year she would be considered postmenopausal.  Any bleeding that occurs after that point would be considered abnormal.  6. Chronic low back pain, unspecified back pain laterality, unspecified whether sciatica present 7. Primary osteoarthritis of both knees Patient will go back to taking gabapentin  300 mg once a day as needed as it was previously prescribed by her orthopedic specialist. She will give a trial of starting the Cymbalta  30 mg daily.  She will let me know if this makes her drowsy or loopy.  8. Periodic limb movement May have been due to iron deficiency.  Once she is no longer anemic, the Requip  can be discontinued to see if symptoms persist.  Patient was given the opportunity to ask questions.  Patient verbalized understanding of the plan and was able to repeat key elements of the plan.   This documentation was completed using Paediatric nurse.  Any transcriptional errors are unintentional.  Orders Placed This Encounter  Procedures   CBC   Iron, TIBC and Ferritin Panel   Ambulatory referral to Gynecology     Requested Prescriptions   Signed Prescriptions Disp Refills   Vitamin D , Ergocalciferol , (DRISDOL ) 1.25 MG (50000 UNIT) CAPS capsule 12 capsule 1    Sig: Take 1 capsule (50,000 Units total) by mouth every 7 (seven) days.    Return for follow up as previously scheduled.  Barnie Louder, MD, FACP

## 2024-02-05 ENCOUNTER — Ambulatory Visit: Payer: Self-pay | Admitting: Internal Medicine

## 2024-02-05 LAB — CBC
Hematocrit: 36.8 % (ref 34.0–46.6)
Hemoglobin: 11.5 g/dL (ref 11.1–15.9)
MCH: 27.3 pg (ref 26.6–33.0)
MCHC: 31.3 g/dL — ABNORMAL LOW (ref 31.5–35.7)
MCV: 87 fL (ref 79–97)
Platelets: 420 x10E3/uL (ref 150–450)
RBC: 4.21 x10E6/uL (ref 3.77–5.28)
RDW: 17.2 % — ABNORMAL HIGH (ref 11.7–15.4)
WBC: 5.9 x10E3/uL (ref 3.4–10.8)

## 2024-02-05 LAB — IRON,TIBC AND FERRITIN PANEL
Ferritin: 121 ng/mL (ref 15–150)
Iron Saturation: >95 % (ref 15–55)
Iron: 294 ug/dL (ref 27–159)
Total Iron Binding Capacity: <311 ug/dL (ref 250–450)
UIBC: <17 ug/dL — ABNORMAL LOW (ref 131–425)

## 2024-02-06 ENCOUNTER — Encounter (INDEPENDENT_AMBULATORY_CARE_PROVIDER_SITE_OTHER): Payer: Self-pay | Admitting: Internal Medicine

## 2024-02-10 NOTE — Telephone Encounter (Signed)
 Spoke with pt, she did not d/c iron prior to testing, Dr. Vicci had her to d/c iron and will retest in the near future

## 2024-02-10 NOTE — Telephone Encounter (Signed)
 Created encounter in error

## 2024-02-11 ENCOUNTER — Ambulatory Visit: Attending: Internal Medicine

## 2024-02-11 VITALS — Ht 65.0 in | Wt 309.0 lb

## 2024-02-11 DIAGNOSIS — Z0000 Encounter for general adult medical examination without abnormal findings: Secondary | ICD-10-CM | POA: Diagnosis not present

## 2024-02-11 NOTE — Patient Instructions (Signed)
 Jenna [name],  Thank you for taking the time for your Medicare Wellness Visit. I appreciate your continued commitment to your health goals. Please review the care plan we discussed, and feel free to reach out if I can assist you further.  Please note that Annual Wellness Visits do not include a physical exam. Some assessments may be limited, especially if the visit was conducted virtually. If needed, we may recommend an in-person follow-up with your provider.  Ongoing Care Seeing your primary care provider every 3 to 6 months helps us  monitor your health and provide consistent, personalized care.   Referrals If a referral was made during today's visit and you haven't received any updates within two weeks, please contact the referred provider directly to check on the status.  Recommended Screenings:  Health Maintenance  Topic Date Due   Medicare Annual Wellness Visit  10/01/2023   COVID-19 Vaccine (1 - 2025-26 season) Never done   DTaP/Tdap/Td vaccine (1 - Tdap) 04/03/2024*   Zoster (Shingles) Vaccine (1 of 2) 04/10/2024*   Flu Shot  06/09/2024*   Pneumococcal Vaccine for age over 46 (2 of 2 - PCV) 01/08/2025*   Hepatitis B Vaccine (1 of 3 - 19+ 3-dose series) 01/08/2025*   Breast Cancer Screening  09/02/2025   Pap with HPV screening  07/12/2026   Colon Cancer Screening  06/19/2033   Hepatitis C Screening  Completed   HIV Screening  Completed   HPV Vaccine  Aged Out   Meningitis B Vaccine  Aged Out  *Topic was postponed. The date shown is not the original due date.       02/11/2024    3:01 PM  Advanced Directives  Does Patient Have a Medical Advance Directive? No  Would patient like information on creating a medical advance directive? No - Patient declined    Vision: Annual vision screenings are recommended for early detection of glaucoma, cataracts, and diabetic retinopathy. These exams can also reveal signs of chronic conditions such as diabetes and high blood  pressure.  Dental: Annual dental screenings help detect early signs of oral cancer, gum disease, and other conditions linked to overall health, including heart disease and diabetes.  Please see the attached documents for additional preventive care recommendations.

## 2024-02-11 NOTE — Progress Notes (Signed)
 I connected with  Jenna [name] on 02/11/24 by a audio enabled telemedicine application and verified that I am speaking with the correct person using two identifiers.  Patient Location: Home  Provider Location: Home Office  Persons Participating in Visit: Patient.  I discussed the limitations of evaluation and management by telemedicine. The patient expressed understanding and agreed to proceed.   Vital Signs: Because this visit was a virtual/telehealth visit, some criteria may be missing or patient reported. Any vitals not documented were not able to be obtained and vitals that have been documented are patient reported.     Chief Complaint  Patient presents with   Medicare Wellness    SUBSEQUENT     Subjective:   Jenna [name] is a 53 y.o. female who presents for a Medicare Annual Wellness Visit.  Visit info / Clinical Intake: Medicare Wellness Visit Type:: Subsequent Annual Wellness Visit Persons participating in visit and providing information:: patient Medicare Wellness Visit Mode:: Telephone If telephone:: video declined Since this visit was completed virtually, some vitals may be partially provided or unavailable. Missing vitals are due to the limitations of the virtual format.: Documented vitals are patient reported If Telephone or Video please confirm:: I connected with patient using audio/video enable telemedicine. I verified patient identity with two identifiers, discussed telehealth limitations, and patient agreed to proceed. Patient Location:: HOME Provider Location:: HOME OFFICE Interpreter Needed?: No Pre-visit prep was completed: yes AWV questionnaire completed by patient prior to visit?: no Living arrangements:: (!) lives alone Patient's Overall Health Status Rating: very good Typical amount of pain: some (ONLY DUE TO OSTEOARTHRITIS) Does pain affect daily life?: (!) yes Are you currently prescribed opioids?: no  Dietary Habits and Nutritional Risks How  many meals a day?: 3 (SNACKS, STAYS HYDRATED WITH WATER) Eats fruit and vegetables daily?: yes Most meals are obtained by: preparing own meals In the last 2 weeks, have you had any of the following?: none Diabetic:: no  Functional Status Activities of Daily Living (to include ambulation/medication): Independent Ambulation: Independent with device- listed below Home Assistive Devices/Equipment: Walker (specify Type); Eyeglasses; Cane (ROLLATOR) Medication Administration: Independent Home Management (perform basic housework or laundry): Independent Manage your own finances?: yes Primary transportation is: driving Concerns about vision?: no *vision screening is required for WTM* Concerns about hearing?: no  Fall Screening Falls in the past year?: 0 Number of falls in past year: 0 Was there an injury with Fall?: 0 Fall Risk Category Calculator: 0 Patient Fall Risk Level: Low Fall Risk  Fall Risk Patient at Risk for Falls Due to: No Fall Risks Fall risk Follow up: Falls evaluation completed; Education provided  Home and Transportation Safety: All rugs have non-skid backing?: N/A, no rugs All stairs or steps have railings?: N/A, no stairs Grab bars in the bathtub or shower?: yes Have non-skid surface in bathtub or shower?: yes (MAT) Good home lighting?: yes Regular seat belt use?: yes Hospital stays in the last year:: (!) yes How many hospital stays:: 1 Reason: ABDOMINAL PAIN  Cognitive Assessment Difficulty concentrating, remembering, or making decisions? : no (BSE: GAMES ON PHONE, READING, ETC.) Will 6CIT or Mini Cog be Completed: no 6CIT or Mini Cog Declined: patient alert, oriented, able to answer questions appropriately and recall recent events  Advance Directives (For Healthcare) Does Patient Have a Medical Advance Directive?: No Would patient like information on creating a medical advance directive?: No - Patient declined  Reviewed/Updated  Reviewed/Updated: Reviewed  All (Medical, Surgical, Family, Medications, Allergies, Care Teams, Patient  Goals)    Allergies (verified) Diflucan  [fluconazole ] and Metformin  and related   Current Medications (verified) Outpatient Encounter Medications as of 02/11/2024  Medication Sig   diclofenac  (VOLTAREN ) 75 MG EC tablet Take 1 tablet (75 mg total) by mouth 2 (two) times daily as needed.   DULoxetine  (CYMBALTA ) 30 MG capsule Take 1 capsule (30 mg total) by mouth daily.   ferrous sulfate  325 (65 FE) MG tablet Take 650 mg by mouth. 2 tabs PO 3x/wk (Patient not taking: Reported on 02/11/2024)   gabapentin  (NEURONTIN ) 300 MG capsule Take 1 capsule (300 mg total) by mouth daily as needed.   hydrOXYzine  (ATARAX ) 25 MG tablet Take 1-2 tablets (25-50 mg total) by mouth every 6 (six) to 8 (eight) hours as needed for itching.   phentermine  (ADIPEX-P ) 37.5 MG tablet Take 1 tablet (37.5 mg total) by mouth daily before breakfast.   rOPINIRole  (REQUIP ) 0.5 MG tablet TAKE 1 TABLET BY MOUTH AT  BEDTIME   tiZANidine  (ZANAFLEX ) 4 MG tablet TAKE 1 TABLET BY MOUTH EVERY 8  HOURS AS NEEDED FOR MUSCLE  SPASM(S)   topiramate  (TOPAMAX ) 25 MG tablet Take 3 tablets (75 mg total) by mouth at bedtime.   Vitamin D , Ergocalciferol , (DRISDOL ) 1.25 MG (50000 UNIT) CAPS capsule Take 1 capsule (50,000 Units total) by mouth every 7 (seven) days.   No facility-administered encounter medications on file as of 02/11/2024.    History: Past Medical History:  Diagnosis Date   Arthritis    Back pain    Back pain    Chest pain    Depression    Edema of both lower extremities    Joint pain    Left arm numbness    Numbness in both hands    Osteoarthritis    Prediabetes    SOB (shortness of breath)    Vitamin D  deficiency    Past Surgical History:  Procedure Laterality Date   CHOLECYSTECTOMY     COLONOSCOPY     COLONOSCOPY WITH PROPOFOL  N/A 07/19/2022   Procedure: COLONOSCOPY WITH PROPOFOL ;  Surgeon: Stacia Glendia BRAVO, MD;  Location: [facility]  ENDOSCOPY;  Service: Gastroenterology;  Laterality: N/A;   COLONOSCOPY WITH PROPOFOL  N/A 10/25/2022   Procedure: COLONOSCOPY WITH PROPOFOL ;  Surgeon: Mansouraty, Aloha Raddle., MD;  Location: [facility] ENDOSCOPY;  Service: Gastroenterology;  Laterality: N/A;   COLONOSCOPY WITH PROPOFOL  N/A 06/20/2023   Procedure: COLONOSCOPY WITH PROPOFOL ;  Surgeon: Mansouraty, Aloha Raddle., MD;  Location: [facility] ENDOSCOPY;  Service: Gastroenterology;  Laterality: N/A;   ENDOSCOPIC MUCOSAL RESECTION N/A 10/25/2022   Procedure: ENDOSCOPIC MUCOSAL RESECTION;  Surgeon: Wilhelmenia Aloha Raddle., MD;  Location: [facility] ENDOSCOPY;  Service: Gastroenterology;  Laterality: N/A;   HEMOSTASIS CLIP PLACEMENT  10/25/2022   Procedure: HEMOSTASIS CLIP PLACEMENT;  Surgeon: Wilhelmenia Aloha Raddle., MD;  Location: [facility] ENDOSCOPY;  Service: Gastroenterology;;   HOT HEMOSTASIS N/A 10/25/2022   Procedure: HOT HEMOSTASIS (ARGON PLASMA COAGULATION/BICAP);  Surgeon: Wilhelmenia Aloha Raddle., MD;  Location: [facility];  Service: Gastroenterology;  Laterality: N/A;   POLYPECTOMY  07/19/2022   Procedure: POLYPECTOMY;  Surgeon: Stacia Glendia BRAVO, MD;  Location: [facility] ENDOSCOPY;  Service: Gastroenterology;;   POLYPECTOMY  06/20/2023   Procedure: POLYPECTOMY, INTESTINE;  Surgeon: Wilhelmenia Aloha Raddle., MD;  Location: [facility];  Service: Gastroenterology;;   SUBMUCOSAL INJECTION  06/20/2023   Procedure: INJECTION, SUBMUCOSAL;  Surgeon: Wilhelmenia Aloha Raddle., MD;  Location: [facility];  Service: Gastroenterology;;   SUBMUCOSAL LIFTING INJECTION  07/19/2022   Procedure: SUBMUCOSAL LIFTING INJECTION;  Surgeon: Stacia Glendia BRAVO, MD;  Location: [facility] ENDOSCOPY;  Service: Gastroenterology;;   SUBMUCOSAL LIFTING INJECTION  10/25/2022   Procedure: SUBMUCOSAL LIFTING INJECTION;  Surgeon: Wilhelmenia Aloha Raddle., MD;  Location: [facility];  Service: Gastroenterology;;   SUBMUCOSAL TATTOO INJECTION  10/25/2022   Procedure: SUBMUCOSAL TATTOO INJECTION;  Surgeon:  Wilhelmenia Aloha Raddle., MD;  Location: [facility];  Service: Gastroenterology;;   TUBAL LIGATION     Family History  Problem Relation Age of Onset   Colon polyps Mother    Diabetes Mother    Hypertension Mother    Hypertension Father    Breast cancer Neg Hx    Colon cancer Neg Hx    Esophageal cancer Neg Hx    Rectal cancer Neg Hx    Stomach cancer Neg Hx    Social History   Occupational History   Occupation: Stay at home, disabled  Tobacco Use   Smoking status: Former    Current packs/day: 0.00    Types: Cigarettes    Quit date: 10/10/2021    Years since quitting: 2.3   Smokeless tobacco: Never  Vaping Use   Vaping status: Never Used  Substance and Sexual Activity   Alcohol use: No    Alcohol/week: 0.0 standard drinks of alcohol   Drug use: No   Sexual activity: Not Currently   Tobacco Counseling Counseling given: Not Answered  SDOH Screenings   Food Insecurity: No Food Insecurity (02/11/2024)  Housing: Low Risk  (02/11/2024)  Transportation Needs: No Transportation Needs (02/11/2024)  Utilities: Not At Risk (02/11/2024)  Alcohol Screen: Low Risk  (02/04/2024)  Depression (PHQ2-9): Low Risk  (02/11/2024)  Recent Concern: Depression (PHQ2-9) - Medium Risk (01/09/2024)  Financial Resource Strain: Low Risk  (02/04/2024)  Physical Activity: Insufficiently Active (02/11/2024)  Social Connections: Unknown (02/11/2024)  Stress: No Stress Concern Present (02/11/2024)  Tobacco Use: Medium Risk (02/11/2024)  Health Literacy: Adequate Health Literacy (02/11/2024)   See flowsheets for full screening details  Depression Screen PHQ 2 & 9 Depression Scale- Over the past 2 weeks, how often have you been bothered by any of the following problems? Little interest or pleasure in doing things: 0 Feeling down, depressed, or hopeless (PHQ Adolescent also includes...irritable): 0 PHQ-2 Total Score: 0 Trouble falling or staying asleep, or sleeping too much: 2 Feeling tired or having  little energy: 1 Poor appetite or overeating (PHQ Adolescent also includes...weight loss): 0 Feeling bad about yourself - or that you are a failure or have let yourself or your family down: 0 Trouble concentrating on things, such as reading the newspaper or watching television (PHQ Adolescent also includes...like school work): 0 Moving or speaking so slowly that other people could have noticed. Or the opposite - being so fidgety or restless that you have been moving around a lot more than usual: 0 Thoughts that you would be better off dead, or of hurting yourself in some way: 0 PHQ-9 Total Score: 3 If you checked off any problems, how difficult have these problems made it for you to do your work, take care of things at home, or get along with other people?: Somewhat difficult     Goals Addressed             This Visit's Progress    Set My Weight Loss Goal       By this time next year (02/2025) I would like to have reached my weight loss goal of 245-260 pounds, so I can have knee replacement surgery.  Objective:    Today's Vitals   02/11/24 1458  Weight: (!) 309 lb (140.2 kg)  Height: 5' 5 (1.651 m)  PainSc: 0-No pain   Body mass index is 51.42 kg/m.  Hearing/Vision screen Hearing Screening - Comments:: Adequate hearing, no hearing aids. Vision Screening - Comments:: Adequate vision, with the use of rx eyeglasses. [facility] exam completed by [facility]. Immunizations and Health Maintenance Health Maintenance  Topic Date Due   COVID-19 Vaccine (1 - 2025-26 season) Never done   DTaP/Tdap/Td (1 - Tdap) 04/03/2024 (Originally 09/08/1989)   Zoster Vaccines- Shingrix (1 of 2) 04/10/2024 (Originally 09/07/2021)   Influenza Vaccine  06/09/2024 (Originally 10/11/2023)   Pneumococcal Vaccine: 50+ Years (2 of 2 - PCV) 01/08/2025 (Originally 09/07/2021)   Hepatitis B Vaccines 19-59 Average Risk (1 of 3 - 19+ 3-dose series) 01/08/2025 (Originally 09/08/1989)   Medicare Annual  Wellness (AWV)  02/10/2025   Mammogram  09/02/2025   Cervical Cancer Screening (HPV/Pap Cotest)  07/12/2026   Colonoscopy  06/19/2033   Hepatitis C Screening  Completed   HIV Screening  Completed   HPV VACCINES  Aged Out   Meningococcal B Vaccine  Aged Out        Assessment/Plan:  This is a routine wellness examination for [place].  Patient Care Team: Vicci Barnie NOVAK, MD as PCP - General (Internal Medicine) [facility] as Consulting Physician (Ophthalmology)  I have personally reviewed and noted the following in the patient's chart:   Medical and social history Use of alcohol, tobacco or illicit drugs  Current medications and supplements including opioid prescriptions. Functional ability and status Nutritional status Physical activity Advanced directives List of other physicians Hospitalizations, surgeries, and ER visits in previous 12 months Vitals Screenings to include cognitive, depression, and falls Referrals and appointments  No orders of the defined types were placed in this encounter.  In addition, I have reviewed and discussed with patient certain preventive protocols, quality metrics, and best practice recommendations. A written personalized care plan for preventive services as well as general preventive health recommendations were provided to patient.   Roz LOISE Fuller, LPN   [id number]   Return in 1 year (on 02/10/2025).  After Visit Summary: (MyChart) Due to this being a telephonic visit, the after visit summary with patients personalized plan was offered to patient via MyChart   Nurse Notes: None at this time.

## 2024-02-24 NOTE — Progress Notes (Addendum)
 The patient attended a screening event on 02/03/2024, where her blood pressure was measured at 134/84 mmHg, and her fasting glucose was 107 mg/dl, placing her in the prediabetic range. During the event, the patient reported that she does not smoke, has Medicare and Medicaid for her insurance, is established with a primary care provider, and does not have have any SDOH needs indicated at this time.  A chart review indicates Dr. Barnie WENDI Louder - [facility] as her PCP. The patients most recent office visit was on 02/04/2024. Pt has a history of elevated bp readings w/o a diagnosis of hypertension as of 04/02/2023. Pt has a history of prediabetes as of 03/28/2022 and is currently being seen by [facility] to address this. She has an upcoming appt with her PCP on 05/11/2024 at 10:00 AM. She has [employer] Medicare and Medicaid for her insurance. Pt is a former smoker and does not have any SDOH needs indicated at this time.   [facility] emailed and mailed letter with bp, blood glucose education materials, [facility] prediabetes prevent program and Healthy Cooking Class in case needed by the pt. (Addend completed to included updated letter and 5 in 5 f/u documentation section after signing).  At this time, no additional support from the Health [facility] Team is indicated.

## 2024-02-26 ENCOUNTER — Ambulatory Visit (INDEPENDENT_AMBULATORY_CARE_PROVIDER_SITE_OTHER): Admitting: Internal Medicine

## 2024-02-26 ENCOUNTER — Encounter (INDEPENDENT_AMBULATORY_CARE_PROVIDER_SITE_OTHER): Payer: Self-pay | Admitting: Internal Medicine

## 2024-02-26 VITALS — BP 112/72 | HR 99 | Temp 98.2°F | Ht 65.0 in | Wt 307.0 lb

## 2024-02-26 DIAGNOSIS — R7303 Prediabetes: Secondary | ICD-10-CM

## 2024-02-26 DIAGNOSIS — E66813 Obesity, class 3: Secondary | ICD-10-CM | POA: Diagnosis not present

## 2024-02-26 DIAGNOSIS — R638 Other symptoms and signs concerning food and fluid intake: Secondary | ICD-10-CM | POA: Diagnosis not present

## 2024-02-26 DIAGNOSIS — Z6841 Body Mass Index (BMI) 40.0 and over, adult: Secondary | ICD-10-CM | POA: Diagnosis not present

## 2024-02-26 DIAGNOSIS — G4733 Obstructive sleep apnea (adult) (pediatric): Secondary | ICD-10-CM | POA: Diagnosis not present

## 2024-02-26 MED ORDER — PHENTERMINE HCL 37.5 MG PO TABS: 37.5000 mg | ORAL_TABLET | Freq: Every day | ORAL | 0 refills | Status: DC

## 2024-02-26 NOTE — Assessment & Plan Note (Signed)
 Untreated she has been educated on the bidirectional relationship of sleep apnea and weight.  Patient is considering restarting PAP therapy

## 2024-02-26 NOTE — Progress Notes (Signed)
 Office: [phone]  /  Fax: [phone]  Weight Summary and Body Composition Analysis (BIA)  Vitals Temp: 98.2 F (36.8 C) BP: 112/72 Pulse Rate: 99 SpO2: 99 %   Anthropometric Measurements Height: 5' 5 (1.651 m) Weight: (!) 307 lb (139.3 kg) BMI (Calculated): 51.09 Weight at Last Visit: 309lb Weight Lost Since Last Visit: 2lb Weight Gained Since Last Visit: 0lb Starting Weight: 321lb Total Weight Loss (lbs): 14 lb (6.35 kg) Peak Weight: 321lb   Body Composition  Body Fat %: 52.3 % Fat Mass (lbs): 161 lbs Muscle Mass (lbs): 139.4 lbs Total Body Water (lbs): 103 lbs Visceral Fat Rating : 20    No data recorded Today's Visit #: 24  Starting Date: 03/14/22   Subjective   Chief Complaint: Obesity  Interval History  Discussed the use of AI scribe software for clinical note transcription with the patient, who gave verbal consent to proceed.  History of Present Illness Jenna [name] is a 53 year old female who presents for medical weight management.  She has lost two pounds since her last visit and a total of fifteen pounds since September 2025, averaging a weight loss of five pounds per month. She follows a 1500 calorie nutrition plan with good compliance and exercises seven days a week for thirty minutes, primarily walking outside and moving throughout the day. She does not have a gym membership due to discomfort with crowded places and prefers exercising at home.  She is currently taking phentermine , which she associates with a significant decrease in appetite, leading to smaller portion sizes. She takes the medication in the morning.  Her physical activity routine was disrupted following a hospital visit, resulting in a month-long cessation of workouts in November. Previously, she was able to work out up to four days a week.  She has access to YouTube workout videos and has equipment at home, including a stepper machine and dumbbells, to facilitate her  exercise routine. She acknowledges the need to resume her workout routine to maintain her weight loss momentum.     Challenges affecting patient progress: orthopedic problems, medical conditions or chronic pain affecting mobility.    Pharmacotherapy for weight management: She is currently taking Phentermine  (longterm use, off-label, single agent)  with adequate clinical response  and without side effects..   Assessment and Plan   Treatment Plan For Obesity:  Recommended Dietary Goals  Jenna [name] is currently in the action stage of change. As such, her goal is to continue weight management plan. She has agreed to: continue current plan  Behavioral Health and Counseling  We discussed the following behavioral modification strategies today: work on tracking and journaling calories using tracking application, continue to work on maintaining a reduced calorie state, getting the recommended amount of protein, incorporating whole foods, making healthy choices, staying well hydrated and practicing mindfulness when eating., and increase protein intake, fibrous foods (25 grams per day for women, 30 grams for men) and water to improve satiety and decrease hunger signals. .  Additional education and resources provided today: None  Recommended Physical Activity Goals  Jenna [name] has been advised to work up to 150 minutes of moderate intensity aerobic activity a week and strengthening exercises 2-3 times per week for cardiovascular health, weight loss maintenance and preservation of muscle mass.  She has agreed to :  Think about enjoyable ways to increase daily physical activity and overcoming barriers to exercise, Increase physical activity in their day and reduce sedentary time (increase NEAT)., Increase volume of physical activity  to a goal of 240 minutes a week, and Combine aerobic and strengthening exercises for efficiency and improved cardiometabolic health.  Medical Interventions and  Pharmacotherapy  We discussed various medication options to help Jenna [name] with her weight loss efforts and we both agreed to : Adequate clinical response to anti-obesity medication, continue current regimen and Patient was counseled on the importance of maintaining healthy lifestyle habits, including balanced nutrition, regular physical activity, and behavioral modifications, while taking antiobesity medication.  Patient verbalized understanding that medication is an adjunct to, not a replacement for, lifestyle changes and that the long-term success and weight maintenance depend on continued adherence to these strategies.  We have discussed that long-term use of phentermine  for weight management. Patient informed that use beyond 12 weeks is off-label. Reviewed evidence supporting extended use in select patients with regular monitoring. Risks discussed: insomnia, increased heart rate, elevated BP, and potential for dependence. Alternatives include lifestyle interventions, FDA-approved medications (e.g., Wegovy , Zepbound, Qsymia, Contrave)- these are either not covered, cost prohibitive or contraindicated. Medical history reviewed for contraindications.  Patient verbalized understanding and agreed to continue phentermine  with close monitoring. Plan includes regular follow-up every 4-6 weeks to assess efficacy, side effects, and vitals.  Medication safety: Reviewed common side effects of phentermine , no side effects reported.  Reviewed vitals signs and they are stable Reviewed for contraindications, none present Reviewed state registry for controlled substances and no other controlled substances found Medication will be discontinued if less than 5% weight loss in 6 months Discussed safety data and off label use for long-term treatment of obesity.  Associated Conditions Impacted by Obesity Treatment  Assessment & Plan OSA (obstructive sleep apnea) Untreated she has been educated on the bidirectional  relationship of sleep apnea and weight.  Patient is considering restarting PAP therapy  Class 3 severe obesity with serious comorbidity and body mass index (BMI) of 50.0 to 59.9 in adult, unspecified obesity type [facility]) She has lost 15 pounds since September 2025 since starting phentermine , with a recent 2-pound loss since the last visit. She adheres to a 1500 calorie nutrition plan and exercises seven days a week for 30 minutes. Phentermine  has effectively suppressed her appetite. Her blood pressure and heart rate are well controlled. Body fat percentage decreased from 53% to 52%, and muscle mass increased slightly, indicating effective weight management. - Continue phentermine  as prescribed. - Encouraged continuation of 1500 calorie nutrition plan. - Encouraged regular physical activity, including walking and home workouts. - Recommended exploring HIIT workouts for increased efficiency in exercise. - Scheduled follow-up appointment in six weeks. Prediabetes Her last A1c was 5.9 at [facility] and improved from previous 6.3 but patient is at risk for progression.  She had been on metformin  in the past but medication was discontinued because of increase in bicarbonate while also taking topiramate .  Her insurance no longer covers GLP-1.  Continue with nutritional and behavioral strategies Abnormal food appetite Improved on phentermine . She has increased orexigenic signaling, impaired satiety and inhibitory control. This is secondary to an abnormal energy regulation system and pathological neurohormonal pathways characteristic of excess adiposity.  In addition to nutritional and behavioral strategies she benefits from pharmacotherapy.          Objective   Physical Exam:  Blood pressure 112/72, pulse 99, temperature 98.2 F (36.8 C), height 5' 5 (1.651 m), weight (!) 307 lb (139.3 kg), last menstrual period 07/30/2023, SpO2 99%. Body mass index is 51.09 kg/m.  General: She is overweight,  cooperative, alert, well developed, and in no  acute distress. PSYCH: Has normal mood, affect and thought process.   HEENT: EOMI, sclerae are anicteric. Lungs: Normal breathing effort, no conversational dyspnea. Extremities: No edema.  Neurologic: No gross sensory or motor deficits. No tremors or fasciculations noted.    Diagnostic Data Reviewed:  BMET    Component Value Date/Time   NA 140 07/23/2022 1213   K 4.7 07/23/2022 1213   CL 108 (H) 07/23/2022 1213   CO2 19 (L) 07/23/2022 1213   GLUCOSE 105 (H) 07/23/2022 1213   GLUCOSE 118 (H) 12/20/2021 1430   BUN 8 07/23/2022 1213   CREATININE 0.89 07/23/2022 1213   CALCIUM 9.0 07/23/2022 1213   GFRNONAA 55 (L) 12/20/2021 1430   GFRAA >60 09/25/2019 1548   Lab Results  Component Value Date   HGBA1C 5.8 04/04/2023   HGBA1C 5.6 12/03/2017   Lab Results  Component Value Date   INSULIN  41.6 (H) 03/14/2022   INSULIN  7.4 12/08/2018   Lab Results  Component Value Date   TSH 1.655 12/20/2021   CBC    Component Value Date/Time   WBC 5.9 02/04/2024 1043   WBC 8.1 12/20/2021 1430   RBC 4.21 02/04/2024 1043   RBC 4.57 12/20/2021 1430   HGB 11.5 02/04/2024 1043   HCT 36.8 02/04/2024 1043   PLT 420 02/04/2024 1043   MCV 87 02/04/2024 1043   MCH 27.3 02/04/2024 1043   MCH 27.4 12/20/2021 1430   MCHC 31.3 (L) 02/04/2024 1043   MCHC 32.1 12/20/2021 1430   RDW 17.2 (H) 02/04/2024 1043   Iron Studies    Component Value Date/Time   IRON 294 (HH) 02/04/2024 1043   TIBC <311 02/04/2024 1043   FERRITIN 121 02/04/2024 1043   IRONPCTSAT >95 (HH) 02/04/2024 1043   Lipid Panel     Component Value Date/Time   CHOL 140 03/14/2022 1030   TRIG 68 03/14/2022 1030   HDL 46 03/14/2022 1030   CHOLHDL 3.8 10/06/2020 1458   LDLCALC 80 03/14/2022 1030   Hepatic Function Panel     Component Value Date/Time   PROT 6.8 07/23/2022 1213   ALBUMIN 3.9 07/23/2022 1213   AST 24 07/23/2022 1213   ALT 28 07/23/2022 1213   ALKPHOS 81  07/23/2022 1213   BILITOT 0.4 07/23/2022 1213      Component Value Date/Time   TSH 1.655 12/20/2021 1430   TSH 1.430 01/23/2021 1525   Nutritional Lab Results  Component Value Date   VD25OH 28.3 (L) 07/23/2022   VD25OH 14.6 (L) 03/14/2022   VD25OH 14.8 (L) 01/23/2021    Medications: Outpatient Encounter Medications as of 02/26/2024  Medication Sig   diclofenac  (VOLTAREN ) 75 MG EC tablet Take 1 tablet (75 mg total) by mouth 2 (two) times daily as needed.   DULoxetine  (CYMBALTA ) 30 MG capsule Take 1 capsule (30 mg total) by mouth daily.   gabapentin  (NEURONTIN ) 300 MG capsule Take 1 capsule (300 mg total) by mouth daily as needed.   hydrOXYzine  (ATARAX ) 25 MG tablet Take 1-2 tablets (25-50 mg total) by mouth every 6 (six) to 8 (eight) hours as needed for itching.   rOPINIRole  (REQUIP ) 0.5 MG tablet TAKE 1 TABLET BY MOUTH AT  BEDTIME   tiZANidine  (ZANAFLEX ) 4 MG tablet TAKE 1 TABLET BY MOUTH EVERY 8  HOURS AS NEEDED FOR MUSCLE  SPASM(S)   topiramate  (TOPAMAX ) 25 MG tablet Take 3 tablets (75 mg total) by mouth at bedtime.   Vitamin D , Ergocalciferol , (DRISDOL ) 1.25 MG (50000 UNIT) CAPS capsule Take 1  capsule (50,000 Units total) by mouth every 7 (seven) days.   [DISCONTINUED] phentermine  (ADIPEX-P ) 37.5 MG tablet Take 1 tablet (37.5 mg total) by mouth daily before breakfast.   ferrous sulfate  325 (65 FE) MG tablet Take 650 mg by mouth. 2 tabs PO 3x/wk (Patient not taking: Reported on 02/26/2024)   phentermine  (ADIPEX-P ) 37.5 MG tablet Take 1 tablet (37.5 mg total) by mouth daily before breakfast.   No facility-administered encounter medications on file as of 02/26/2024.     Follow-Up   Return in about 6 years (around 02/25/2030) for For Weight Mangement with Dr. [email or web address].[email or web address] She was informed of the importance of frequent follow up visits to maximize her success with intensive lifestyle modifications for her multiple health conditions.  Attestation Statement   Reviewed by clinician  on day of visit: allergies, medications, problem list, medical history, surgical history, family history, social history, and previous encounter notes.     Lucas Francyne, MD

## 2024-02-26 NOTE — Assessment & Plan Note (Signed)
 Improved on phentermine . She has increased orexigenic signaling, impaired satiety and inhibitory control. This is secondary to an abnormal energy regulation system and pathological neurohormonal pathways characteristic of excess adiposity.  In addition to nutritional and behavioral strategies she benefits from pharmacotherapy.

## 2024-02-26 NOTE — Assessment & Plan Note (Signed)
 She has lost 15 pounds since September 2025 since starting phentermine , with a recent 2-pound loss since the last visit. She adheres to a 1500 calorie nutrition plan and exercises seven days a week for 30 minutes. Phentermine  has effectively suppressed her appetite. Her blood pressure and heart rate are well controlled. Body fat percentage decreased from 53% to 52%, and muscle mass increased slightly, indicating effective weight management. - Continue phentermine  as prescribed. - Encouraged continuation of 1500 calorie nutrition plan. - Encouraged regular physical activity, including walking and home workouts. - Recommended exploring HIIT workouts for increased efficiency in exercise. - Scheduled follow-up appointment in six weeks.

## 2024-02-26 NOTE — Assessment & Plan Note (Signed)
 Her last A1c was 5.9 at [facility] and improved from previous 6.3 but patient is at risk for progression.  She had been on metformin  in the past but medication was discontinued because of increase in bicarbonate while also taking topiramate .  Her insurance no longer covers GLP-1.  Continue with nutritional and behavioral strategies

## 2024-04-08 ENCOUNTER — Ambulatory Visit (INDEPENDENT_AMBULATORY_CARE_PROVIDER_SITE_OTHER): Admitting: Internal Medicine

## 2024-04-12 ENCOUNTER — Encounter (INDEPENDENT_AMBULATORY_CARE_PROVIDER_SITE_OTHER): Payer: Self-pay | Admitting: Internal Medicine

## 2024-04-13 ENCOUNTER — Telehealth (INDEPENDENT_AMBULATORY_CARE_PROVIDER_SITE_OTHER): Admitting: Internal Medicine

## 2024-04-13 ENCOUNTER — Encounter (INDEPENDENT_AMBULATORY_CARE_PROVIDER_SITE_OTHER): Payer: Self-pay | Admitting: Internal Medicine

## 2024-04-13 VITALS — BP 138/87 | Ht 65.0 in | Wt 307.0 lb

## 2024-04-13 DIAGNOSIS — R7303 Prediabetes: Secondary | ICD-10-CM | POA: Diagnosis not present

## 2024-04-13 DIAGNOSIS — G4733 Obstructive sleep apnea (adult) (pediatric): Secondary | ICD-10-CM | POA: Diagnosis not present

## 2024-04-13 DIAGNOSIS — R638 Other symptoms and signs concerning food and fluid intake: Secondary | ICD-10-CM | POA: Diagnosis not present

## 2024-04-13 DIAGNOSIS — E88819 Insulin resistance, unspecified: Secondary | ICD-10-CM

## 2024-04-13 DIAGNOSIS — Z6841 Body Mass Index (BMI) 40.0 and over, adult: Secondary | ICD-10-CM

## 2024-04-13 DIAGNOSIS — E66813 Obesity, class 3: Secondary | ICD-10-CM

## 2024-04-13 MED ORDER — PHENTERMINE HCL 37.5 MG PO TABS: 37.5000 mg | ORAL_TABLET | Freq: Every day | ORAL | 0 refills | Status: AC

## 2024-04-13 NOTE — Assessment & Plan Note (Signed)
 Class 3 severe obesity with a BMI of 50.0 to 59.9. Reports weight loss and improved physical endurance. Adheres to a 1500 calorie nutrition plan and has started a new exercise class. Phentermine  aids in appetite control without affecting heart rate or blood pressure. Discussed potential benefits of Zepbound for weight loss. - Continue phentermine  37.5 MG oral daily before breakfast. - Encouraged participation in exercise classes and chair exercises three times a week for 20-30 minutes. - Will schedule follow-up in

## 2024-04-13 NOTE — Assessment & Plan Note (Signed)
 Improved on phentermine .  She has increased orexigenic signaling, impaired satiety and inhibitory control. This is secondary to an abnormal energy regulation system and pathological neurohormonal pathways characteristic of excess adiposity.  In addition to nutritional and behavioral strategies she benefits from ongoing pharmacotherapy.  Continue phentermine 

## 2024-04-13 NOTE — Progress Notes (Unsigned)
 Appointment Time: 12:00   Visit  25  Do you feel as if you have LOST weight?   Plan Cat 3   Percent Following  100%   Are you exercising?:  NOT IN THE LAST WEEK   What type of exercise Walking and Exercise class, variety of exercises   Days       3       Minutes  30-60   Do you check your BP at home?  138/87   Medication Refills   Phentermine    Pharmacy    [facility] in [facility]   Are you drinking enough water?   NO   Skipping Meals?   YES     Getting Recommended amount of protein?   YES   Are you sleeping between 7-9 hours? NO   Will anyone else be on the call with you?      NO          NAME OF PERSON IF APPLICABLE:

## 2024-04-13 NOTE — Assessment & Plan Note (Signed)
 Contributing to poor sleep quality and daytime fatigue. Last sleep study was four years ago, and she has not started CPAP treatment. Difficulty sleeping may be exacerbated by sleep apnea rather than phentermine . Discussed the importance of addressing sleep apnea to improve overall health and potentially aid in weight management. An updated sleep study is necessary to evaluate the severity of sleep apnea. - Referred to [facility] for an updated sleep study. - Encouraged follow-up with sleep medicine specialist to address sleep apnea.

## 2024-05-11 ENCOUNTER — Ambulatory Visit: Admitting: Internal Medicine

## 2024-05-11 ENCOUNTER — Ambulatory Visit (INDEPENDENT_AMBULATORY_CARE_PROVIDER_SITE_OTHER): Admitting: Internal Medicine

## 2024-06-18 ENCOUNTER — Ambulatory Visit: Admitting: Adult Health
